# Patient Record
Sex: Female | Born: 1950 | Race: White | Hispanic: No | Marital: Married | Smoking: Never smoker
Health system: Southern US, Community
[De-identification: ages and names within clinical notes are randomized; demographics above are authoritative.]

## PROBLEM LIST (undated history)

## (undated) DIAGNOSIS — N809 Endometriosis, unspecified: Secondary | ICD-10-CM

## (undated) DIAGNOSIS — D229 Melanocytic nevi, unspecified: Secondary | ICD-10-CM

## (undated) DIAGNOSIS — F99 Mental disorder, not otherwise specified: Secondary | ICD-10-CM

## (undated) DIAGNOSIS — M797 Fibromyalgia: Secondary | ICD-10-CM

## (undated) DIAGNOSIS — R002 Palpitations: Secondary | ICD-10-CM

## (undated) DIAGNOSIS — M1990 Unspecified osteoarthritis, unspecified site: Secondary | ICD-10-CM

## (undated) DIAGNOSIS — C44311 Basal cell carcinoma of skin of nose: Secondary | ICD-10-CM

## (undated) DIAGNOSIS — R0602 Shortness of breath: Secondary | ICD-10-CM

## (undated) DIAGNOSIS — S83209A Unspecified tear of unspecified meniscus, current injury, unspecified knee, initial encounter: Secondary | ICD-10-CM

## (undated) DIAGNOSIS — F32A Depression, unspecified: Secondary | ICD-10-CM

## (undated) DIAGNOSIS — R238 Other skin changes: Secondary | ICD-10-CM

## (undated) DIAGNOSIS — G4700 Insomnia, unspecified: Secondary | ICD-10-CM

## (undated) DIAGNOSIS — R011 Cardiac murmur, unspecified: Secondary | ICD-10-CM

## (undated) DIAGNOSIS — R233 Spontaneous ecchymoses: Secondary | ICD-10-CM

## (undated) DIAGNOSIS — IMO0002 Reserved for concepts with insufficient information to code with codable children: Secondary | ICD-10-CM

## (undated) DIAGNOSIS — M8580 Other specified disorders of bone density and structure, unspecified site: Secondary | ICD-10-CM

## (undated) DIAGNOSIS — Z9289 Personal history of other medical treatment: Secondary | ICD-10-CM

## (undated) DIAGNOSIS — F329 Major depressive disorder, single episode, unspecified: Secondary | ICD-10-CM

## (undated) DIAGNOSIS — I959 Hypotension, unspecified: Secondary | ICD-10-CM

## (undated) DIAGNOSIS — R61 Generalized hyperhidrosis: Secondary | ICD-10-CM

## (undated) DIAGNOSIS — G43909 Migraine, unspecified, not intractable, without status migrainosus: Secondary | ICD-10-CM

## (undated) DIAGNOSIS — M170 Bilateral primary osteoarthritis of knee: Secondary | ICD-10-CM

## (undated) DIAGNOSIS — M810 Age-related osteoporosis without current pathological fracture: Secondary | ICD-10-CM

## (undated) DIAGNOSIS — K589 Irritable bowel syndrome without diarrhea: Secondary | ICD-10-CM

## (undated) DIAGNOSIS — B009 Herpesviral infection, unspecified: Secondary | ICD-10-CM

## (undated) DIAGNOSIS — R87619 Unspecified abnormal cytological findings in specimens from cervix uteri: Secondary | ICD-10-CM

## (undated) DIAGNOSIS — F419 Anxiety disorder, unspecified: Secondary | ICD-10-CM

## (undated) DIAGNOSIS — E079 Disorder of thyroid, unspecified: Secondary | ICD-10-CM

## (undated) DIAGNOSIS — R079 Chest pain, unspecified: Secondary | ICD-10-CM

## (undated) DIAGNOSIS — K219 Gastro-esophageal reflux disease without esophagitis: Secondary | ICD-10-CM

## (undated) DIAGNOSIS — E039 Hypothyroidism, unspecified: Secondary | ICD-10-CM

## (undated) HISTORY — DX: Depression, unspecified: F32.A

## (undated) HISTORY — DX: Migraine, unspecified, not intractable, without status migrainosus: G43.909

## (undated) HISTORY — PX: JOINT REPLACEMENT: SHX530

## (undated) HISTORY — DX: Irritable bowel syndrome, unspecified: K58.9

## (undated) HISTORY — DX: Fibromyalgia: M79.7

## (undated) HISTORY — DX: Unspecified tear of unspecified meniscus, current injury, unspecified knee, initial encounter: S83.209A

## (undated) HISTORY — PX: SKIN CANCER EXCISION: SHX779

## (undated) HISTORY — DX: Disorder of thyroid, unspecified: E07.9

## (undated) HISTORY — PX: APPENDECTOMY: SHX54

## (undated) HISTORY — DX: Gastro-esophageal reflux disease without esophagitis: K21.9

## (undated) HISTORY — PX: GYNECOLOGIC CRYOSURGERY: SHX857

## (undated) HISTORY — PX: COLPOSCOPY W/ BIOPSY / CURETTAGE: SUR283

## (undated) HISTORY — DX: Mental disorder, not otherwise specified: F99

## (undated) HISTORY — DX: Shortness of breath: R06.02

## (undated) HISTORY — DX: Bilateral primary osteoarthritis of knee: M17.0

## (undated) HISTORY — DX: Reserved for concepts with insufficient information to code with codable children: IMO0002

## (undated) HISTORY — DX: Age-related osteoporosis without current pathological fracture: M81.0

## (undated) HISTORY — DX: Other specified disorders of bone density and structure, unspecified site: M85.80

## (undated) HISTORY — PX: BUNIONECTOMY: SHX129

## (undated) HISTORY — PX: COLONOSCOPY: SHX174

## (undated) HISTORY — DX: Insomnia, unspecified: G47.00

## (undated) HISTORY — DX: Chest pain, unspecified: R07.9

## (undated) HISTORY — DX: Major depressive disorder, single episode, unspecified: F32.9

## (undated) HISTORY — DX: Endometriosis, unspecified: N80.9

## (undated) HISTORY — DX: Unspecified osteoarthritis, unspecified site: M19.90

## (undated) HISTORY — PX: ELBOW SURGERY: SHX618

## (undated) HISTORY — PX: TONSILECTOMY, ADENOIDECTOMY, BILATERAL MYRINGOTOMY AND TUBES: SHX2538

## (undated) HISTORY — DX: Herpesviral infection, unspecified: B00.9

## (undated) HISTORY — PX: UPPER GI ENDOSCOPY: SHX6162

## (undated) HISTORY — PX: LAPAROSCOPY: SHX197

## (undated) HISTORY — DX: Palpitations: R00.2

## (undated) HISTORY — DX: Generalized hyperhidrosis: R61

## (undated) HISTORY — DX: Unspecified abnormal cytological findings in specimens from cervix uteri: R87.619

---
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
## 1975-03-03 DIAGNOSIS — Z9289 Personal history of other medical treatment: Secondary | ICD-10-CM

## 1975-03-03 HISTORY — DX: Personal history of other medical treatment: Z92.89

## 1988-03-02 HISTORY — PX: ABDOMINAL HYSTERECTOMY: SHX81

## 1997-11-21 ENCOUNTER — Other Ambulatory Visit: Admission: RE | Admit: 1997-11-21 | Discharge: 1997-11-21 | Payer: Self-pay | Admitting: *Deleted

## 1998-11-27 ENCOUNTER — Other Ambulatory Visit: Admission: RE | Admit: 1998-11-27 | Discharge: 1998-11-27 | Payer: Self-pay | Admitting: *Deleted

## 1999-06-18 ENCOUNTER — Ambulatory Visit (HOSPITAL_COMMUNITY): Admission: RE | Admit: 1999-06-18 | Discharge: 1999-06-18 | Payer: Self-pay | Admitting: Gastroenterology

## 2000-03-02 HISTORY — PX: CERVICAL FUSION: SHX112

## 2000-06-25 ENCOUNTER — Encounter: Payer: Self-pay | Admitting: Neurosurgery

## 2000-06-25 ENCOUNTER — Ambulatory Visit (HOSPITAL_COMMUNITY): Admission: RE | Admit: 2000-06-25 | Discharge: 2000-06-25 | Payer: Self-pay | Admitting: Neurosurgery

## 2000-08-18 ENCOUNTER — Inpatient Hospital Stay (HOSPITAL_COMMUNITY): Admission: RE | Admit: 2000-08-18 | Discharge: 2000-08-19 | Payer: Self-pay | Admitting: Neurosurgery

## 2000-08-18 ENCOUNTER — Encounter: Payer: Self-pay | Admitting: Neurosurgery

## 2000-08-30 ENCOUNTER — Encounter: Admission: RE | Admit: 2000-08-30 | Discharge: 2000-08-30 | Payer: Self-pay | Admitting: Neurosurgery

## 2000-08-30 ENCOUNTER — Encounter: Payer: Self-pay | Admitting: Neurosurgery

## 2000-09-22 ENCOUNTER — Other Ambulatory Visit: Admission: RE | Admit: 2000-09-22 | Discharge: 2000-09-22 | Payer: Self-pay | Admitting: *Deleted

## 2000-10-14 ENCOUNTER — Encounter: Payer: Self-pay | Admitting: Neurosurgery

## 2000-10-14 ENCOUNTER — Encounter: Admission: RE | Admit: 2000-10-14 | Discharge: 2000-10-14 | Payer: Self-pay | Admitting: Neurosurgery

## 2001-02-17 ENCOUNTER — Ambulatory Visit (HOSPITAL_COMMUNITY): Admission: RE | Admit: 2001-02-17 | Discharge: 2001-02-17 | Payer: Self-pay | Admitting: Gastroenterology

## 2001-02-17 ENCOUNTER — Encounter: Payer: Self-pay | Admitting: Gastroenterology

## 2001-03-02 HISTORY — PX: CHOLECYSTECTOMY: SHX55

## 2001-03-10 ENCOUNTER — Encounter (INDEPENDENT_AMBULATORY_CARE_PROVIDER_SITE_OTHER): Payer: Self-pay | Admitting: Specialist

## 2001-03-10 ENCOUNTER — Ambulatory Visit (HOSPITAL_COMMUNITY): Admission: RE | Admit: 2001-03-10 | Discharge: 2001-03-11 | Payer: Self-pay | Admitting: Surgery

## 2001-10-17 ENCOUNTER — Other Ambulatory Visit: Admission: RE | Admit: 2001-10-17 | Discharge: 2001-10-17 | Payer: Self-pay | Admitting: *Deleted

## 2001-12-28 ENCOUNTER — Encounter: Payer: Self-pay | Admitting: Otolaryngology

## 2001-12-28 ENCOUNTER — Encounter: Admission: RE | Admit: 2001-12-28 | Discharge: 2001-12-28 | Payer: Self-pay | Admitting: Otolaryngology

## 2002-10-25 ENCOUNTER — Other Ambulatory Visit: Admission: RE | Admit: 2002-10-25 | Discharge: 2002-10-25 | Payer: Self-pay | Admitting: *Deleted

## 2003-03-13 ENCOUNTER — Encounter: Admission: RE | Admit: 2003-03-13 | Discharge: 2003-03-13 | Payer: Self-pay | Admitting: Internal Medicine

## 2003-07-02 ENCOUNTER — Ambulatory Visit (HOSPITAL_COMMUNITY): Admission: RE | Admit: 2003-07-02 | Discharge: 2003-07-02 | Payer: Self-pay | Admitting: Gastroenterology

## 2003-07-05 ENCOUNTER — Ambulatory Visit (HOSPITAL_COMMUNITY): Admission: RE | Admit: 2003-07-05 | Discharge: 2003-07-05 | Payer: Self-pay | Admitting: Gastroenterology

## 2003-11-02 ENCOUNTER — Ambulatory Visit (HOSPITAL_COMMUNITY): Admission: RE | Admit: 2003-11-02 | Discharge: 2003-11-02 | Payer: Self-pay | Admitting: Gastroenterology

## 2003-11-02 ENCOUNTER — Encounter (INDEPENDENT_AMBULATORY_CARE_PROVIDER_SITE_OTHER): Payer: Self-pay | Admitting: *Deleted

## 2003-11-29 ENCOUNTER — Other Ambulatory Visit: Admission: RE | Admit: 2003-11-29 | Discharge: 2003-11-29 | Payer: Self-pay | Admitting: *Deleted

## 2004-01-16 ENCOUNTER — Ambulatory Visit (HOSPITAL_COMMUNITY): Admission: RE | Admit: 2004-01-16 | Discharge: 2004-01-16 | Payer: Self-pay | Admitting: Gastroenterology

## 2004-01-16 ENCOUNTER — Encounter (INDEPENDENT_AMBULATORY_CARE_PROVIDER_SITE_OTHER): Payer: Self-pay | Admitting: Specialist

## 2004-12-10 ENCOUNTER — Other Ambulatory Visit: Admission: RE | Admit: 2004-12-10 | Discharge: 2004-12-10 | Payer: Self-pay | Admitting: *Deleted

## 2005-03-22 ENCOUNTER — Emergency Department (HOSPITAL_COMMUNITY): Admission: EM | Admit: 2005-03-22 | Discharge: 2005-03-22 | Payer: Self-pay | Admitting: Family Medicine

## 2005-11-24 ENCOUNTER — Ambulatory Visit (HOSPITAL_COMMUNITY): Admission: RE | Admit: 2005-11-24 | Discharge: 2005-11-24 | Payer: Self-pay | Admitting: Orthopedic Surgery

## 2005-12-17 ENCOUNTER — Encounter: Admission: RE | Admit: 2005-12-17 | Discharge: 2005-12-17 | Payer: Self-pay | Admitting: Internal Medicine

## 2006-02-08 ENCOUNTER — Ambulatory Visit (HOSPITAL_COMMUNITY): Admission: RE | Admit: 2006-02-08 | Discharge: 2006-02-08 | Payer: Self-pay | Admitting: Internal Medicine

## 2006-02-17 ENCOUNTER — Other Ambulatory Visit: Admission: RE | Admit: 2006-02-17 | Discharge: 2006-02-17 | Payer: Self-pay | Admitting: *Deleted

## 2006-02-25 ENCOUNTER — Encounter: Admission: RE | Admit: 2006-02-25 | Discharge: 2006-02-25 | Payer: Self-pay | Admitting: Gastroenterology

## 2006-04-02 ENCOUNTER — Encounter (INDEPENDENT_AMBULATORY_CARE_PROVIDER_SITE_OTHER): Payer: Self-pay | Admitting: Specialist

## 2006-04-02 ENCOUNTER — Ambulatory Visit (HOSPITAL_COMMUNITY): Admission: RE | Admit: 2006-04-02 | Discharge: 2006-04-02 | Payer: Self-pay | Admitting: Gastroenterology

## 2007-02-22 ENCOUNTER — Other Ambulatory Visit: Admission: RE | Admit: 2007-02-22 | Discharge: 2007-02-22 | Payer: Self-pay | Admitting: *Deleted

## 2007-07-11 ENCOUNTER — Ambulatory Visit (HOSPITAL_BASED_OUTPATIENT_CLINIC_OR_DEPARTMENT_OTHER): Admission: RE | Admit: 2007-07-11 | Discharge: 2007-07-11 | Payer: Self-pay | Admitting: Rheumatology

## 2007-07-26 ENCOUNTER — Ambulatory Visit: Payer: Self-pay | Admitting: Internal Medicine

## 2007-07-28 ENCOUNTER — Encounter: Admission: RE | Admit: 2007-07-28 | Discharge: 2007-07-28 | Payer: Self-pay | Admitting: Gastroenterology

## 2008-05-30 DIAGNOSIS — D229 Melanocytic nevi, unspecified: Secondary | ICD-10-CM

## 2008-05-30 HISTORY — DX: Melanocytic nevi, unspecified: D22.9

## 2009-03-26 ENCOUNTER — Encounter: Admission: RE | Admit: 2009-03-26 | Discharge: 2009-03-26 | Payer: Self-pay | Admitting: Neurosurgery

## 2009-04-17 ENCOUNTER — Encounter: Admission: RE | Admit: 2009-04-17 | Discharge: 2009-04-17 | Payer: Self-pay | Admitting: Neurosurgery

## 2009-05-23 ENCOUNTER — Encounter: Admission: RE | Admit: 2009-05-23 | Discharge: 2009-05-23 | Payer: Self-pay | Admitting: Neurosurgery

## 2010-03-02 HISTORY — PX: CATARACT EXTRACTION: SUR2

## 2010-03-23 ENCOUNTER — Encounter: Payer: Self-pay | Admitting: Gastroenterology

## 2010-05-21 ENCOUNTER — Encounter (INDEPENDENT_AMBULATORY_CARE_PROVIDER_SITE_OTHER): Payer: Self-pay | Admitting: Physician Assistant

## 2010-05-21 DIAGNOSIS — A6000 Herpesviral infection of urogenital system, unspecified: Secondary | ICD-10-CM

## 2010-05-21 DIAGNOSIS — Z01419 Encounter for gynecological examination (general) (routine) without abnormal findings: Secondary | ICD-10-CM

## 2010-05-23 NOTE — Progress Notes (Signed)
[name], [name]                  ACCOUNT NO.:  [id number]  MEDICAL RECORD NO.:  [id number]           PATIENT TYPE:  A  LOCATION:  [facility]                   FACILITY:  [facility]  PHYSICIAN:  Maylon Cos, CNM    DATE OF BIRTH:  09/21/1950  DATE OF SERVICE:  05/21/2010                                 CLINIC NOTE  The patient is being seen in GYN Clinic at [facility].  REASON FOR TODAY'S VISIT:  Annual exam and Pap smear.  The patient presents with no complaints.  HISTORY OF PRESENT ILLNESS:  The patient has a long problem list to include; 1. Arthritis. 2. Acid reflux. 3. Irritable bowel syndrome. 4. Depression. 5. Thyroid disease. 6. Fibromyalgia. 7. Multiple allergies. 8. Migraines.  DRUG ALLERGIES:  Include CODEINE and SULFA.  CURRENT MEDICATIONS:  Numerous and listed in the chart.  HEALTH CARE MAINTENANCE:  The patient is up-to-date on her tetanus and flu shot.  Her last mammogram was in August 2011 and found to be normal. Her last fecal occult stool was in 2012, by Dr. Vassie Loll, her primary care doctor.  Colonoscopy was normal in 2009.  Last dental exam in 2011.  MENSTRUAL HISTORY:  She had menarche at the age of 59.  Her last period was in 1990.  She had a complete hysterectomy for endometriosis and has not had any bleeding or pain since.  CONTRACEPTIVE HISTORY:  She is status post hysterectomy.  OBSTETRICAL HISTORY:  She is a gravida 1, para 0.  She has 2 adopted children but no live birth.  GYNECOLOGIC HISTORY:  Her last Pap smear was done in 2010 and she has had yearly Pap smears by Dr. Chevis Pretty since her hysterectomy.  She had history of an abnormal Pap smear greater than 10 years ago that was treated with colposcopy and cryo.  STD HISTORY:  Positive for genital herpes for which she currently takes Valtrex daily.  PERSONAL MEDICAL HISTORY:  Has already been reviewed.  She did receive a blood transfusion in 1997.  SURGICAL HISTORY:  Positive for a total  hysterectomy by a Dr. Darin Engels in 1990.  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  She lives with her 2 year old daughter.  She consumes approximately 1 glass of wine in the evenings.  Drug use is positive for marijuana 20 years ago.  She has had 1 partner in the last year, however, this is her husband who died in 12/18/2022.  FAMILY HISTORY:  Positive for heart disease and heart attack in her father who died in 2023-02-17 and colon cancer in her father.  Her mother has recently been diagnosed with dementia and has had multiple strokes.  SYSTEMIC REVIEW:  Positive for anxiety and depression for which she is currently being treated by Dr. Evelene Croon. Occasional palpitations which are being evaluated by her primary care doctor, Dr. Vassie Loll, and swelling in her hands, legs, and feet.  PHYSICAL EXAMINATION:  GENERAL:  Today, [name] is a frail-appearing 60 year old Caucasian female, in no apparent distress.  Her exam today is GYN focused. HEENT:  Grossly normal. BREASTS:  Symmetrical without  dimpling or retraction in the skin. Nipples are erect without discharge.  No masses or tenderness with palpation. ABDOMEN:  Soft and nontender.  No masses.  No hepatosplenomegaly. GU:  External genitalia without lesions.  She does have hypotrophic vaginal tissue with moderate tone.  Her cuff appears to be intact on speculum exam.  No unusual or abnormal discharge noted in the vault. Bimanual exam reveals absent cervix, absent uterus, absent adnexa bilaterally, nontender with exam. RECTAL:  Positive for some very small asymptomatic hemorrhoids.  Rectal exam was deferred.  Lower extremities are negative for edema.  Equal pedal pulses.  ASSESSMENT: 1. Normal well-woman exam posthysterectomy. 2. Multiple medical diagnoses, currently being treated by multiple     providers. 3. Anxiety, depression, recently exacerbated by the death of her     husband and her  father and stress of mother recent diagnosis of     dementia.  PLAN: 1. We discussed recommendations and given that her hysterectomy was     done for noncancerous reasons, Pap smears are no longer necessary.     She is in agreement with this plan.  She does desire annual     physicals to be done in our clinic rather than with her primary     care provider. 2. Health care maintenance.  She is due for mammogram in August 2012. 3. The patient is encouraged to contact her counselor.  She recently     had a change in her insurance and her previous therapists are no     longer in her preferred network, however, she has not received     counseling since the death of her husband and her father and     increased stress related to her mother's dementia diagnosis.  The     patient is strongly encouraged to contact her provider for     recommendations of new therapist counselor in her network. 4. The patient is instructed to take all medications as prescribed by     her providers. 5. Prescriptions are being given for her suppression of Valtrex 500 mg     daily and her hormone replacement therapy which is estradiol patch     0.005 mg.  We have discussed that we should begin weaning this in     the next year as well and she is in agreement.  The patient is also     being given a prescription for topical Vaniqa     secondary to complaint of increased hair growth, needing waxing and     depilatory of her upper lip.  The patient should return in 12     months for GYN physical or sooner if problems.          ______________________________ Maylon Cos, CNM    SS/MEDQ  D:  05/21/2010  T:  05/22/2010  Job:  [id number]

## 2010-07-15 NOTE — Procedures (Signed)
[name], [name]                  ACCOUNT NO.:  [id number]   MEDICAL RECORD NO.:  [id number]          PATIENT TYPE:  OUT   LOCATION:  SLEEP CENTER                 FACILITY:  [facility]   PHYSICIAN:  Clinton D. Maple Hudson, MD, FCCP, FACPDATE OF BIRTH:  24-May-1950   DATE OF STUDY:  07/11/2007                            NOCTURNAL POLYSOMNOGRAM   REFERRING PHYSICIAN:  Kathryne Hitch, MD   INDICATION FOR STUDY:  Insomnia with sleep apnea.    Epworth sleepiness score 12/24, BMI 19.6, weight 118 pounds, height 65  inches, neck 10.5 inches.   HOME MEDICATION:  Charted and reviewed.   SLEEP ARCHITECTURE:  Total sleep time 311.5 minutes with sleep  efficiency 88.7%.  Stage 1 was 5.1%, stage 2 was 94.9%, stage 3 absent,  REM absent, sleep latency 27.5 minutes.  Awake after sleep onset 12  minutes.  Arousal index 8.9.  Bedtime medication included Topamax,  Klonopin, and a medication for bone strength.   RESPIRATORY DATA:  Apnea/hypopnea index (AHI) 0.8 per hour, which is  normal.  A total of 4 events were scored including 2 obstructive apneas  and 2 hypopneas.  All events occurred while sleeping supine.  REM AHI 0.   OXYGEN DATA:  Moderate snoring with oxygen desaturation to a nadir of  89%.  Mean oxygen saturation through the study was 96.5% on room air.   CARDIAC DATA:  Normal sinus rhythm.   MOVEMENT/PARASOMNIA:  No significant movement disturbance.  No bathroom  trips.   IMPRESSION/RECOMMENDATION:  1. Sleep architecture reflected medication, significant for absence of      delta sleep and rapid eye movement but with relatively little      waking during sleep.  2. Insignificant respiratory disturbance, apnea/hypopnea index 0.8 per      hour with moderate snoring and oxygen desaturation to a nadir of      89%.  3. No significant movement disturbance.  4. Suggest managing as insomnia.      Clinton D. Maple Hudson, MD, [facility], FACP  Diplomate, Biomedical engineer of Sleep Medicine  Electronically  Signed     CDY/MEDQ  D:  07/23/2007 19:18:27  T:  07/23/2007 21:39:26  Job:  [id number]

## 2010-07-18 NOTE — Op Note (Signed)
[place]. [facility]  Patient:    [name], [name] Visit Number: [id number] MRN: [id number]          Service Type: DSU Location: 5700 [id number] Attending Physician:  Shelly Rubenstein Dictated by:   Abigail Miyamoto, M.D. Proc. Date: 03/10/01 Admit Date:  03/10/2001                             Operative Report  PREOPERATIVE DIAGNOSIS:  Biliary dyskinesia.  POSTOPERATIVE DIAGNOSIS:  Symptomatic cholelithiasis.  OPERATION PERFORMED:  Laparoscopic cholecystectomy.  SURGEON:  Abigail Miyamoto, M.D.  ANESTHESIA:  General endotracheal anesthesia.  ASSISTANT:  Anselm Pancoast. Zachery Dakins, M.D.  INDICATIONS FOR PROCEDURE:  [name] is a 60 year old female who presented with abdominal pain, nausea and vomiting.  She was found to have a normal ultrasound without evidence of stones and a HIDA scan which showed a questionably low ejection fraction.  Given her symptoms, decision was made to proceed to the operating room for a laparoscopic cholecystectomy.  FINDINGS:   The patient was found to have a gallbladder with some scarring and small stones inside.  ESTIMATED BLOOD LOSS:  DESCRIPTION OF PROCEDURE:  Patient brought to operating room and identified as [name].  She was placed supine on the operating table and general anesthesia was induced.  Her abdomen was then prepped and draped in the usual sterile fashion.  Using a 15 blade, a small vertical incision was made through a previous incision at the umbilical incision.  The incision was carried down to the fascia with an open scalpel.  A hemostat was then used to pass into the peritoneal cavity.  A 0 Vicryl pursestring suture was then placed around the fascial opening.  The Hasson port was placed through the opening and insufflation of the abdomen was begun.  Next, an 11 mm port was placed in the epigastrium and two 5 mm ports were placed in the patients right flank all under direct vision.  The gallbladder  was then grasped and retracted above the liver bed.  The cystic duct was dissected out and clipped once distally and twice proximally and transected with scissors.  The cystic artery was then identified and clipped twice proximally and once distally and transected as well.  The gallbladder was then dissected free from the liver bed with electrocautery.  During dissection the gallbladder was torn slightly and some tiny stones spilled out.  Once the gallbladder was completely excised from the liver bed it was placed in an Endo sac.  Hemostasis was then achieved in the liver bed with electrocautery.  The gallbladder was then removed through the port of the umbilicus.  The 0 Vicryl at the umbilicus was then tied in place closing fascial defect.  The abdomen was then copiously irrigated with normal saline.  Hemostasis appeared to be achieved.  All ports were then removed under direct vision.  The abdomen was deflated.  All incisions were then anesthetized with 0.25% Marcaine and closed with 4-0 Vicryl subcuticular sutures.  Steri-Strips, gauze and tape were applied.  The patient tolerated the procedure well.  All sponge, needle and instrument counts were correct at the end of the procedure.  The patient was then extubated in the operating room and taken in stable condition to the recovery room. Dictated by:   Abigail Miyamoto, M.D. Attending Physician:  Shelly Rubenstein DD:  03/10/01 TD:  03/10/01 Job: [id number]

## 2010-07-18 NOTE — Op Note (Signed)
[place]. [facility]  Patient:    [name], [name]                           MRN: [id number] Proc. Date: 08/18/00 Adm. 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  POSTOPERATIVE DIAGNOSIS:  C6, C7 spondylosis with a chronic C7 radiculopathy.  PROCEDURES:  Anterior C6-7 diskectomy, bilateral foraminotomy, decompression of spinal cord, iliac crest bone graft, bone bank.  Premier plate. Microscope.  SURGEON:  Tanya Nones. Jeral Fruit, M.D.  ASSISTANT:  Hewitt Shorts, M.D.  CLINICAL HISTORY:  The patient is a 60 year old female complaining of neck and right upper extremity pain.  She has failed conservative treatment.  X-rays show that she has spondylosis between C6 and C7 as well as some degenerative changes in the right shoulder.  Surgery was advised.  The patient knew of the risks as explained in the history and physical.  DESCRIPTION OF PROCEDURE:  The patient was taken to the OR and after intubation, the left side of the neck was prepped with Betadine.  Midline incision transversely was done through the skin, platysma, and anterior cervical spine.  X-rays showed that indeed we were at the level of C6-7. There was anterior herniation of the disk.  The anterior ligament was opened. The disk was above the _____.   The normal space was removed, and we entered into the end plate.  Total gross diskectomy was achieved.  We brought the microscope into the area and with the 1 and 2 mm Kerrison punch, foraminotomy was done.  The right side was worse than the left one.  Decompression of the spinal cord was achieved after we removed the posterior ligament.  Then the end plates were drilled, and a piece of bone graft of 7 mm was inserted, followed by a plate using four screws.  Lateral C-spine showed good position of the graft and the  plate.  From then on, the area was irrigated. Investigation of the esophagus, trachea, and carotid was normal.  Then the wound was closed with Vicryl and Steri-Strips. DD:  08/18/00 TD:  08/18/00 Job: [id number]

## 2010-07-18 NOTE — H&P (Signed)
[place]. [facility]  Patient:    [name], [name]                           MRN: [id number] Adm. Date:  [id number] Attending:  Danella Penton                         History and Physical  HISTORY OF PRESENT ILLNESS:  The patient is a 60 year old female who had been seen by me on several occasions in my office because of right arm pain since August of last year.  At the beginning, she was given some medication and she was treated on several occasions with prednisone, anti-inflammatories, pain medication and physical therapy with no improvement whatsoever.  The pain is always going to the right shoulder and lately, for the past few months, she had been complaining of pain going to the right arm.  She has good days and bad days but overall, she never had been back to normal.  She denies any pain in the left upper extremity.  She had been seen by [place] in the office on several occasions, up to the point that we ended up doing an outpatient cervical myelogram.  Now, she decided to go ahead with surgery.  PAST MEDICAL HISTORY:  Tonsillectomy.  She had surgery to repair the tubal ligation in ______ , hysterectomy in 1990, hemorrhoidectomy.  ALLERGIES:  She is allergic to SULFA and CODEINE.  SOCIAL HISTORY:  Patient does not smoke, does not drink.  She is 146 pounds and she is 5 feet 1 inch.  FAMILY HISTORY:  Mother has a history of high blood pressure and father has a history of colon cancer and coronary artery bypass.  REVIEW OF SYSTEMS:  She has a history of fracture of the spine back in 1976. She has a history of back pain and blood transfusion in 1976.  PHYSICAL EXAMINATION:  HEENT:  Normal.  NECK:  She is able to flex but extension and lateralization produce some discomfort going to the shoulder.  LUNGS:  Clear.  HEART:  Heart sounds normal.  ABDOMEN:  Normal.  EXTREMITIES:  Normal pulses.  NEUROLOGIC:  Mental status normal.  Cranial nerves  normal.  Strength:  In the right upper extremity, she has a normal deltoid.  I can break the right biceps, right wrist and also the right triceps.  The left arm is normal. Lower extremities normal.  Sensation normal.  Reflexes normal.  IMAGING STUDIES:  The MRI as well as the cervical myelogram showed that indeed she has spondylosis at the level of C6-C7.  CLINICAL IMPRESSION:  Spondylosis, C6-C7, with accompanying C7 radiculopathy.  RECOMMENDATION:  I talked to her and her friend on several occasions in my office.  They came to my office on three or four occasions and she was sent back to the orthopedic surgeon to check the shoulder.  Finally, she decided to go ahead with surgery because she cannot deal with the pain.  The procedure will be anterior diskectomy at the level of C6-C7, followed with bone graft and a plate.  She knows about the risks such as infection, CSF leak, worsening of the pain, paralysis, need for further surgery and failure of the plate, failure of the bone graft and damage to the esophagus, damage to the trachea and ______ .  She declined more opinion. DD:  08/18/00 TD:  08/18/00 Job: [id number]

## 2010-07-18 NOTE — Op Note (Signed)
NAMEGWENDOLINE, JUDY                            ACCOUNT NO.:  [id number]   MEDICAL RECORD NO.:  [id number]                   PATIENT TYPE:  AMB   LOCATION:  ENDO                                 FACILITY:  [facility]   PHYSICIAN:  Anselmo Rod, M.D.               DATE OF BIRTH:  07/02/1950   DATE OF PROCEDURE:  DATE OF DISCHARGE:                                 OPERATIVE REPORT   PROCEDURE PERFORMED:  Esophagogastroduodenoscopy.   ENDOSCOPIST:  Charna Elizabeth, M.D.   INSTRUMENT USED:  Olympus video panendoscope.   INDICATIONS FOR PROCEDURE:  This is a 60 year old white female with a  history of severe epigastric pain status post laparoscopic cholecystectomy  in the past with abnormal weight loss and five positive stools.  Rule out  peptic ulcer disease, esophagitis, gastritis, etc.   PROCEDURE PREPARATION:  Informed consent was procured from the patient.  The  patient was fasted for eight hours prior to the procedure.   PREPROCEDURE PHYSICAL EXAMINATION:  VITAL SIGNS:  The patient has stable  vital signs.  NECK:  Supple.  CHEST:  Clear to auscultation.  HEART:  S1 and S2 regular.  ABDOMEN:  Soft with normal bowel sounds.   DESCRIPTION OF PROCEDURE:  The patient was placed in the left lateral  decubitus position and sedated with 100 mg of Demerol and 6 mg of Versed in  slow incremental doses.  Once the patient was adequately sedated and  maintained on low flow oxygen and continuous cardiac monitoring, the Olympus  video panendoscope was advanced through the mouthpiece, over the tongue,  into the esophagus under direct vision.  The entire esophagus appeared  normal with no evidence of ring, stricture, masses, esophagitis or Barrett's  mucosa.  Except for a small hiatal hernia seen on high retroflexion, the  entire gastric mucosa and the proximal small bowel appeared normal.  The  esophagus was normal with no evidence of ring, stricture, masses,  esophagitis or Barrett's mucosa.  The  scope was then advanced into the  stomach.  The entire gastric mucosa appeared normal on retroflexed view in  the high cardia.  The proximal small bowel was normal as well.   IMPRESSION:  Normal EGD.   RECOMMENDATIONS:  1. Repeat guaiacs on an outpatient basis.  2. Reassess need for colonoscopy considering the patient's family history of     colon cancer in several family members.  3. Continue a high fiber diet with liberal fluid intake and avoid     nonsteroidals for now.  4. A daily diary is to be maintained.  5. Further recommendations will be made in followup.                                               Jyothi Nat  Loreta Ave, M.D.    JNM/MEDQ  D:  07/02/2003  T:  07/03/2003  Job:  [id number]   cc:   Larina Earthly, M.D.  [address]  [place]  [place] [address]  Fax: [phone]

## 2010-07-18 NOTE — Op Note (Signed)
[name], NORED                  ACCOUNT NO.:  [id number]   MEDICAL RECORD NO.:  [id number]          PATIENT TYPE:  AMB   LOCATION:  ENDO                         FACILITY:  [facility]   PHYSICIAN:  Anselmo Rod, M.D.  DATE OF BIRTH:  1950/07/26   DATE OF PROCEDURE:  11/02/2003  DATE OF DISCHARGE:  11/02/2003                                 OPERATIVE REPORT   PROCEDURE PERFORMED:  Colonoscopy with cold biopsies x3.   ENDOSCOPIST:  Anselmo Rod, M.D.   INSTRUMENT USED:  Olympus videocolonoscope.   INDICATION FOR THE PROCEDURE:  A 60 year old white female with a family  history of colon cancer and guaiac-positive stool diagnosed in the recent  past, undergoing screening colonoscopy, rule out colonic polyps, masses,  etc.   PREPROCEDURE PREPARATION:  Informed consent was procured from the patient.  The patient was fasted for eight hours prior to the procedure and prepped  with a bottle of magnesium citrate and a gallon of GoLYTELY the night prior  to the procedure.   PREPROCEDURE PHYSICAL:  VITAL SIGNS:  Stable.  NECK:  Supple.  CHEST:  Clear to auscultation.  S1 and S2 regular.  ABDOMEN:  Soft with normal bowel sounds.   DESCRIPTION OF THE PROCEDURE:  The patient was placed in the left lateral  decubitus position and sedated with 60 mg of Demerol and 6.5 mg of Versed in  slow incremental doses.  Once the patient was adequately sedated and  maintained on low-flow oxygen and continuous cardiac monitoring, the Olympus  videocolonoscope was advanced from the rectum to the cecum with difficulty.  A small sessile polyp was biopsied from the mid-right colon.  There was a  large amount of residual stool and visualization and, therefore,  visualization was inadequate.  No other abnormalities were recognized.  The  patient tolerated the procedure well without complications.   IMPRESSION:  1.  Small sessile polyp cold biopsied from mid-right colon.  2.  Large amount of residual stool  in the colon.  Small lesions could have      been missed.   RECOMMENDATIONS:  1.  Await pathology results.  2.  Repeat colonoscopy at an earlier date as visualization was inadequate      today.  3.  Outpatient followup in the next two weeks for further recommendations.  4.  A high fiber diet with liberal fluid intake has been advised and      avoidance of laxatives has been encouraged.       JNM/MEDQ  D:  12/31/2003  T:  12/31/2003  Job:  [id number]   cc:   Talmadge Coventry, M.D.  [address]  [place]  [place] [address]  Fax: [phone]

## 2010-07-18 NOTE — Op Note (Signed)
[place]. [facility]  Patient:    [name], [name]                           MRN: [id number] Proc. Date: 06/18/99 Adm. Date:  [id number] Attending:  Charna Elizabeth CC:         Edwin L. Judie Grieve, M.D.             Almedia Balls. Randell Patient, M.D.                           Operative Report  REFERRING PHYSICIAN:  Edwin L. Judie Grieve, M.D.  PROCEDURE:  Esophagogastroduodenoscopy.  ENDOSCOPIST:  Anselmo Rod, M.D.  INSTRUMENT:  Olympus video panendoscope.  INDICATIONS:  Epigastric pain and guaiac positive stool in a 60 year old white female, rule out peptic ulcer disease, esophagitis, gastritis, etc.  PREPROCEDURE PREPARATION:  Informed consent was procured from the patient.  The  patient was fasted for eight hours prior to the procedure.  PREPROCEDURE PHYSICAL EXAMINATION:  VITAL SIGNS: The patient had stable vital signs.  NECK:  Supple.  CHEST: Clear to auscultation.  HEART: S1 and S2 regular. ABDOMEN: Soft with normal abdominal bowel sounds.  Epigastric tenderness on palpation with no guarding, rebound, or regidity.  No hepatosplenomegaly.  DESCRIPTION OF PROCEDURE:  The patient was placed in the left lateral decubitus  position and sedated with 40 mg of Demerol and 5 mg of Versed intravenously. Once the patient was adequately sedated and maintained on low flow oxygen and continuous cardiac monitoring, the Olympus video panendoscope was advanced through the mouthpiece, over the tongue, into the esophagus under direct vision.  The entire esophagus appeared normal without evidence of ring, stricture, masses, lesions, or esophagitis.  The scope was then advanced to the stomach.  A 3 to 4 mm hiatal hernia was seen in retroflexion in the high cardia.  The rest of the gastric mucosa and the proximal small bowel appeared healthy.  IMPRESSION:  A small hiatal hernia measuring 3 to 4 mm, otherwise normal EGD.  RECOMMENDATIONS:  Proceed with colonoscopy at this time.  Antireflux  measures have been emphasized.  Avoid all nonsteroidals.  Outpatient follow-up in the next two weeks. DD:  06/18/99 TD:  06/18/99 Job: [id number]

## 2010-07-18 NOTE — Procedures (Signed)
[place]  Patient:    [name], [name]                           MRN: [id number] Proc. Date: 06/18/99 Adm. Date:  [id number] Attending:  Charna Elizabeth CC:         Almedia Balls. Randell Patient, M.D.             Edwin L. Judie Grieve, M.D.                           Procedure Report  DATE OF BIRTH:  November 19, 1950.  REFERRING PHYSICIAN:  PROCEDURE PERFORMED:  Colonoscopy.  ENDOSCOPIST:  Anselmo Rod, M.D.  INSTRUMENT USED:  Olympus video colonoscope.  INDICATIONS FOR PROCEDURE:  Guaiac positive stool and a family history of colon cancer in a 60 year old white female.  Patients father and two of his siblings have had colon cancer in the past.  PREPROCEDURE PREPARATION:  Informed consent was procured from the patient. The patient was fasted for eight hours prior to the procedure and prepped with a bottle of magnesium citrate and a gallon of NuLytely the night prior to the procedure.  PREPROCEDURE PHYSICAL:  The patient had stable vital signs.  Neck supple. Chest clear to auscultation.  S1, S2 regular.  Abdomen soft with normal abdominal bowel sounds.  DESCRIPTION OF PROCEDURE:  The patient was placed in the left lateral decubitus position and sedated with Demerol and Versed for the EGD.  No additional sedation was used for the colonoscopy.  Once the patient was adequately positioned and well sedated, the Olympus video colonoscope was advanced from the rectum to the cecum without difficulty.  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  There were small internal hemorrhoids seen on retroflexion.  No masses, polyps, erosions, ulcerations or diverticula were present.  IMPRESSION: 1. Diffuse melanosis coli throughout the colon with more prominent changes    in the right colon. 2. Small nonbleeding internal hemorrhoids. 3. No masses or polyps  seen.  RECOMMENDATIONS: 1. The patient has been advised to increase the fluid and fiber in her diet. 2. Stool softeners have been advocated at bedtime. 3. She is to avoid harsh laxatives. 4. Five-year colonoscopy is recommended for repeat colorectal cancer screening    unless she were to develop any problems in the interim. 5. Follow-up is advised in the office in the next two weeks.DD:  06/18/99 TD:  06/18/99 Job: [id number]

## 2010-07-18 NOTE — Op Note (Signed)
NAMERUTHMARY, [name]                  ACCOUNT NO.:  [id number]   MEDICAL RECORD NO.:  [id number]          PATIENT TYPE:  AMB   LOCATION:  ENDO                         FACILITY:  [facility]   PHYSICIAN:  Anselmo Rod, M.D.  DATE OF BIRTH:  1950-08-06   DATE OF PROCEDURE:  01/16/2004  DATE OF DISCHARGE:                                 OPERATIVE REPORT   PROCEDURE PERFORMED:  Colonoscopy with biopsies times one (cold biopsy).   ENDOSCOPIST:  Charna Elizabeth, M.D.   INSTRUMENT USED:  Olympus video colonoscope.   INDICATIONS FOR PROCEDURE:  The patient is a 60 year old white female with a  family history of colon cancer in her father and personal history of  adenomatous polyps and intermittent bright red blood per rectum.  The  patient also has a history of chronic constipation.  Patient undergoing  colonoscopy to rule out colonic polyps, masses, etc.   PREPROCEDURE PREPARATION:  Informed consent was procured from the patient.  The patient was fasted for eight hours prior to the procedure and prepped  with a bottle of magnesium citrate and a gallon of GoLYTELY the night prior  to the procedure.   PREPROCEDURE PHYSICAL:  The patient had stable vital signs.  Neck supple.  Chest clear to auscultation.  S1 and S2 regular.  Abdomen soft with normal  bowel sounds.   DESCRIPTION OF PROCEDURE:  The patient was placed in left lateral decubitus  position and sedated with 75 mg of Demerol and 7.5 mg of Versed in slow  incremental doses.  Once the patient was adequately sedated and maintained  on low flow oxygen and continuous cardiac monitoring, the Olympus video  colonoscope was advanced from the rectum to the cecum.  The appendicular  orifice and ileocecal valve were clearly visualized and photographed.  There  was some residual stool in the colon and multiple washes were done.  A small  sessile polyp was biopsied from 50 cm.  Small internal hemorrhoids were seen  on retroflexion.  The rest of the  exam was unremarkable.  The patient  tolerated the procedure well without immediate complications.   IMPRESSION:  1.  Small nonbleeding internal hemorrhoids.  2.  Small sessile polyp biopsied from 50 cm.  3.  Otherwise normal-appearing colon up to the cecum.   RECOMMENDATIONS:  1.  Await pathology results.  2.  Avoid all nonsteroidals including aspirin for the next two weeks.  3.  Repeat colorectal cancer screening depending on pathology results.  4.  Outpatient followup as need arises in the future.       JNM/MEDQ  D:  01/16/2004  T:  01/16/2004  Job:  [id number]   cc:   Larina Earthly, M.D.  [address]  [place]  [place] [address]  Fax: [phone]

## 2010-07-24 ENCOUNTER — Ambulatory Visit (INDEPENDENT_AMBULATORY_CARE_PROVIDER_SITE_OTHER): Payer: PRIVATE HEALTH INSURANCE | Admitting: Physician Assistant

## 2010-07-24 DIAGNOSIS — N951 Menopausal and female climacteric states: Secondary | ICD-10-CM

## 2010-08-04 ENCOUNTER — Other Ambulatory Visit: Payer: Self-pay | Admitting: Dermatology

## 2010-08-15 NOTE — Group Therapy Note (Unsigned)
[name], Fischer                  ACCOUNT NO.:  [id number]  MEDICAL RECORD NO.:  [id number]           PATIENT TYPE:  A  LOCATION:  [facility]                   FACILITY:  [facility]  PHYSICIAN:  Maylon Cos, CNM    DATE OF BIRTH:  08/13/1950  DATE OF SERVICE:  07/24/2010                                 CLINIC NOTE  Reason for today's visit in GYN Clinic at [facility] is a complaint of unusual growth in vaginal area.  HISTORY OF PRESENT ILLNESS:  The patient is a 60 year old nulligravida patient whose problem list include the following; 1. Arthritis. 2. Acid reflux. 3. Irritable bowel syndrome. 4. Depression. 5. Thyroid disease. 6. Fibromyalgia. 7. Multiple allergies. 8. Migraines. 9. Surgically postmenopause.  HISTORY OF PRESENT ILLNESS:  The patient states that she first noticed this area on her vaginal area 2-3 weeks ago.  She has previously been told that after the loss of her husband, but is now in a new relationship and is desiring to resume sexual intercourse.  She is concerned for what this lesion is and would like further evaluation.  On examination, [name] is a pleasant 60 year old Caucasian female in no apparent distress.  HEENT is grossly normal.  Vital signs are stable. Her blood pressure is 102/64, weight is 124.6 today.  She is pleasant and appropriate during exam and her affect is much improved from the last time I have seen her.  Exam today is problem focused in the genital area.  She is Tanner V.  She has vaginal atrophy smooth, pink mucosa. There is an approximately 0.5 cm area that is suspicious for lichen sclerosus versus condyloma noted near the introitus.  I have discussed the need for biopsy of this area for definitive diagnosis.  The patient agrees with this plan however, but does not want to have this biopsy done today and would like to return at a later date due to a trip and concern for pain control.  ASSESSMENT:  Vaginal lesion of unknown  etiology.  PLAN:  The patient should return at her convenience for biopsy for skin lesion and definitive diagnosis.  In the meantime, the patient is being given lidocaine gel to help with her discomfort and refills on her estradiol patch and Estrace cream.         ______________________________ Maylon Cos, CNM   SS/MEDQ  D:  08/14/2010  T:  08/15/2010  Job:  [id number]

## 2010-08-28 ENCOUNTER — Ambulatory Visit: Payer: PRIVATE HEALTH INSURANCE | Admitting: Physician Assistant

## 2010-10-08 ENCOUNTER — Encounter: Payer: Self-pay | Admitting: *Deleted

## 2010-10-08 DIAGNOSIS — M797 Fibromyalgia: Secondary | ICD-10-CM | POA: Insufficient documentation

## 2010-10-08 DIAGNOSIS — F329 Major depressive disorder, single episode, unspecified: Secondary | ICD-10-CM

## 2010-10-08 DIAGNOSIS — K219 Gastro-esophageal reflux disease without esophagitis: Secondary | ICD-10-CM

## 2010-10-08 DIAGNOSIS — G43909 Migraine, unspecified, not intractable, without status migrainosus: Secondary | ICD-10-CM | POA: Insufficient documentation

## 2010-10-08 DIAGNOSIS — M1990 Unspecified osteoarthritis, unspecified site: Secondary | ICD-10-CM

## 2010-10-08 DIAGNOSIS — K589 Irritable bowel syndrome without diarrhea: Secondary | ICD-10-CM | POA: Insufficient documentation

## 2010-10-09 ENCOUNTER — Encounter: Payer: Self-pay | Admitting: Family Medicine

## 2010-10-09 ENCOUNTER — Other Ambulatory Visit (HOSPITAL_COMMUNITY)
Admission: RE | Admit: 2010-10-09 | Discharge: 2010-10-09 | Disposition: A | Discharge status: Home / Self Care | Payer: PRIVATE HEALTH INSURANCE | Source: Ambulatory Visit | Attending: Family Medicine | Admitting: Family Medicine

## 2010-10-09 ENCOUNTER — Ambulatory Visit (INDEPENDENT_AMBULATORY_CARE_PROVIDER_SITE_OTHER): Payer: PRIVATE HEALTH INSURANCE | Admitting: Family Medicine

## 2010-10-09 VITALS — BP 99/58 | HR 76 | Temp 97.9°F | Wt 122.4 lb

## 2010-10-09 DIAGNOSIS — N9089 Other specified noninflammatory disorders of vulva and perineum: Secondary | ICD-10-CM

## 2010-10-09 DIAGNOSIS — N909 Noninflammatory disorder of vulva and perineum, unspecified: Secondary | ICD-10-CM | POA: Insufficient documentation

## 2010-10-09 NOTE — Progress Notes (Signed)
Pt. With small area noted on prior exam who returns for biopsy.  She notes no complaints.  She is unsure if it is growing or not.  On left labia minora is small area of thickening. Otherwise vulva appears clean.   Patient identified, informed consent signed, copy in chart, time out performed.    Area cleansed with Alcohol.  Injected with 1% Lidocaine with Epi.  2 mL.  3 mm punch biopsy performed without difficulty.  Hemostasis obtained with Silver Nitrate.  Patient tolerated procedure well.   Patient given post procedure instructions.   Will call biopsy results.

## 2010-10-09 NOTE — Progress Notes (Signed)
Addended by: Toula Moos on: 10/09/2010 04:52 PM   Modules accepted: Orders

## 2010-10-21 ENCOUNTER — Other Ambulatory Visit: Payer: Self-pay | Admitting: *Deleted

## 2010-10-21 MED ORDER — ESTRADIOL 0.05 MG/24HR TD PTWK: 1.0000 | MEDICATED_PATCH | TRANSDERMAL | Status: DC

## 2010-10-21 NOTE — Progress Notes (Signed)
Telephone message left for pt that a refill for Climara patch has been authorized by Dr. Shawnie Pons and sent to her pharmacy.

## 2011-04-20 ENCOUNTER — Other Ambulatory Visit: Payer: Self-pay | Admitting: *Deleted

## 2011-04-20 MED ORDER — ESTRADIOL 0.05 MG/24HR TD PTWK: 1.0000 | MEDICATED_PATCH | TRANSDERMAL | Status: DC

## 2011-04-20 NOTE — Telephone Encounter (Signed)
Pt left message requesting refills of Climara patch.  She also stated that her name and address have changed and wants to give [place] the information.  Refill order received from Maylon Cos CNM with instructions that pt needs clinic appt 1 yr from original Rx (which will be Aug. 2013).  Returned call to pt and left message that Rx has been sent. I also stated that she will need to make clinic appt in August for annual Gyn exam. I instructed pt to call back to the appt line with her new name and address.

## 2011-04-21 ENCOUNTER — Telehealth: Payer: Self-pay | Admitting: *Deleted

## 2011-04-21 MED ORDER — ESTRADIOL 0.05 MG/24HR TD PTWK: 1.0000 | MEDICATED_PATCH | TRANSDERMAL | Status: DC

## 2011-04-21 NOTE — Telephone Encounter (Signed)
Returned pt call and left message that I have spoken with the pharmacy. They informed me that she was given the brand medication yesterday not the generic and this is what she has been receiving. If the co-pay has changed, then maybe the formulary has changed with her insurance. She should contact her insurance company for more information. Also, the Rx was sent as substitution permissible so that if she would like the generic form of med she may request that when she gets her next refill. Pt may call back if she has additional questions.

## 2011-04-21 NOTE — Telephone Encounter (Signed)
Pt left message stating that she picked up her medicine but the cost is too high. Please call back to discuss.

## 2011-04-21 NOTE — Telephone Encounter (Signed)
Received call from pt. She stated that the Rx needs to be written for brand necessary instead of substitution permissible. Then her co-pay will be lower. Rx changed and pt notified.

## 2011-06-11 ENCOUNTER — Other Ambulatory Visit: Payer: Self-pay | Admitting: *Deleted

## 2011-06-11 ENCOUNTER — Other Ambulatory Visit: Payer: Self-pay | Admitting: Physician Assistant

## 2011-07-06 ENCOUNTER — Telehealth: Payer: Self-pay | Admitting: *Deleted

## 2011-07-06 NOTE — Telephone Encounter (Signed)
Patient called , states her new name is prue, lingenfelter sure if she had given that information.   States I have been on climera and has called recently to have [name] change it so it wouldn't cost so much, but it still costs $80 something a month- states so " I want to go back to the pill estradiol or estrace because it is riduculous to pay that much for patch.  States [facility] is [facility] [address] , call me back , let me know if this is possible. Called Maleigh and informed her we received her message and will send her request to [name] and once we hear back from her we will let her know. Patient voices understanding.

## 2011-07-13 ENCOUNTER — Telehealth: Payer: Self-pay | Admitting: *Deleted

## 2011-07-13 NOTE — Telephone Encounter (Signed)
Pt left a message requesting that we prescribe estradiol for her again because the climara patch is too expensive for her.

## 2011-07-16 MED ORDER — ESTRADIOL 1 MG PO TABS: 1.0000 mg | ORAL_TABLET | Freq: Every day | ORAL | Status: DC

## 2011-07-16 NOTE — Telephone Encounter (Signed)
Spoke with patient. RX sent to pharmacy. Pt will followup in 3 months.

## 2011-07-16 NOTE — Telephone Encounter (Signed)
Yes, please give 1mg  Estadiol #30, 3 RF. Needs FU appt in 3 months.

## 2011-07-29 ENCOUNTER — Telehealth: Payer: Self-pay | Admitting: *Deleted

## 2011-07-29 NOTE — Telephone Encounter (Signed)
Called pt and pt informed me that she has no sex drive and just got married.  I informed pt that some of her medications that she is taking can cause decrease sex drive.  I advised pt to make an appt so that she can speak with a provider about options.  Pt stated understanding and that she wanted to see [name].  I transferred pt to front desk, Antoinette, to schedule appt.

## 2011-07-29 NOTE — Telephone Encounter (Signed)
Patient called stating is having problems with sex. Has no libido and no feeling. Needs help. Would like call back.

## 2011-08-05 ENCOUNTER — Encounter: Payer: Self-pay | Admitting: Physician Assistant

## 2011-08-05 ENCOUNTER — Ambulatory Visit (INDEPENDENT_AMBULATORY_CARE_PROVIDER_SITE_OTHER): Payer: PRIVATE HEALTH INSURANCE | Admitting: Physician Assistant

## 2011-08-05 VITALS — BP 112/67 | HR 68 | Temp 96.5°F | Resp 20 | Ht 65.0 in | Wt 130.0 lb

## 2011-08-05 DIAGNOSIS — N952 Postmenopausal atrophic vaginitis: Secondary | ICD-10-CM

## 2011-08-05 DIAGNOSIS — F329 Major depressive disorder, single episode, unspecified: Secondary | ICD-10-CM

## 2011-08-05 DIAGNOSIS — R6882 Decreased libido: Secondary | ICD-10-CM

## 2011-08-05 DIAGNOSIS — F32A Depression, unspecified: Secondary | ICD-10-CM

## 2011-08-05 MED ORDER — ESTROGENS, CONJUGATED 0.625 MG/GM VA CREA: TOPICAL_CREAM | VAGINAL | Status: DC

## 2011-08-05 NOTE — Progress Notes (Signed)
Chief Complaint:  Follow-up   [name] is  61 y.o. G1P0010.  No LMP recorded. Patient has had a hysterectomy..   She presents complaining of Follow-up  Reports newly married in January, recently started having vaginal dryness and decreased breast sensation. States increased depression symptom treated by PCP: Dr. Lafayette Dragon with Abilify. Reports depression symptoms much improved but c/o decreased sexual desire  Past Medical History: Past Medical History  Diagnosis Date  . GERD (gastroesophageal reflux disease)   . IBS (irritable bowel syndrome)   . Mental disorder     depression  . Migraines   . Abnormal Pap smear     hx of colpo and cryo  . Thyroid disease   . Fibromyalgia   . Herpes   . Insomnia   . Depression   . Ulcer   . Arthritis     osteoarthritis    Past Surgical History: Past Surgical History  Procedure Date  . Cervical fusion     x2  . Elbow surgery   . Tonsilectomy, adenoidectomy, bilateral myringotomy and tubes   . Appendectomy     age 49  . Cholecystectomy   . Abdominal hysterectomy 1990  . Laparoscopy     age 46    Family History: Family History  Problem Relation Age of Onset  . Colon cancer Father   . Heart disease Father   . Kidney failure Father   . Hypertension Father   . Alcohol abuse Father   . Stroke Mother   . Osteoporosis Mother   . Rheum arthritis Mother   . Dementia Mother   . Hypertension Mother   . Anxiety disorder Brother   . Insomnia Brother   . Depression Brother   . Alcohol abuse Brother     Social History: History  Substance Use Topics  . Smoking status: Never Smoker   . Smokeless tobacco: Not on file  . Alcohol Use: 3.6 oz/week    6 Glasses of wine per week    Allergies:  Allergies  Allergen Reactions  . Adhesive (Tape)   . Codeine   . Sulfa Antibiotics      (Not in a hospital admission)  Review of Systems - Negative except what is mentioned in HPI  Physical Exam   Blood pressure 112/67, pulse 68,  temperature 96.5 F (35.8 C), temperature source Oral, resp. rate 20, height 5\' 5"  (1.651 m), weight 130 lb (58.968 kg).  General: General appearance - alert, well appearing, and in no distress Focused Gynecological Exam: Moderate atrophy noted of the vulva and vagina.    Assessment: 1. Vaginal atrophy  conjugated estrogens (PREMARIN) vaginal cream  2. Libido, decreased  conjugated estrogens (PREMARIN) vaginal cream     Plan: Rx 2 gram Estradiol Cream sent to [facility], use every other day Continue astroglide RTC 8 weeks or prn  [name] E. 08/05/2011,4:37 PM

## 2011-08-06 ENCOUNTER — Telehealth: Payer: Self-pay | Admitting: *Deleted

## 2011-08-06 NOTE — Telephone Encounter (Signed)
Patient called again she is concerned that she was given premarin, she didn't think that this is what she and [name] had discussed. Please advise.

## 2011-08-06 NOTE — Telephone Encounter (Signed)
Patient called and stated she was seen yesterday by Maylon Cos and she faxed in an order to [facility] for conjugated estrogen- states when I got to [facility] they did not have such a thing- only had premarin vaginal cream which was already premixed. [name] thought $20 was actually $60. I didn't know it was gonna be premarin, I have some questions for [name] -what her thoughts are and if it is ok- [facility] [address]- is more convenient.

## 2011-08-12 NOTE — Telephone Encounter (Signed)
Spoke to patient and her question is if the "conjugated estrogen" is the same as premarin. Advised patient that it is. Patient states she's gotten the Premarin already from [facility] and will now use. [facility] [address] from here on out. Patient satisfied.

## 2012-03-07 ENCOUNTER — Telehealth: Payer: Self-pay | Admitting: *Deleted

## 2012-03-07 NOTE — Telephone Encounter (Signed)
[name] called and left a message stating she was a patient of Gilford Rile and her pharmacy has been trying to get a prescription renewed for valcyclovir. States pharmacy told her they think [name] is no longer here. Patient requesting someone renew her prescription and she receive a call to tell her who she will need to see now if [name] is gone. States she really needs the prescription renewed- doesn't want to run out.

## 2012-03-09 ENCOUNTER — Encounter: Payer: Self-pay | Admitting: Obstetrics & Gynecology

## 2012-03-09 ENCOUNTER — Ambulatory Visit (INDEPENDENT_AMBULATORY_CARE_PROVIDER_SITE_OTHER): Payer: BC Managed Care – PPO | Admitting: Obstetrics & Gynecology

## 2012-03-09 VITALS — BP 100/67 | HR 76 | Temp 97.4°F | Ht 65.0 in | Wt 131.8 lb

## 2012-03-09 DIAGNOSIS — N951 Menopausal and female climacteric states: Secondary | ICD-10-CM

## 2012-03-09 DIAGNOSIS — Z01419 Encounter for gynecological examination (general) (routine) without abnormal findings: Secondary | ICD-10-CM

## 2012-03-09 DIAGNOSIS — Z0000 Encounter for general adult medical examination without abnormal findings: Secondary | ICD-10-CM

## 2012-03-09 DIAGNOSIS — R232 Flushing: Secondary | ICD-10-CM

## 2012-03-09 MED ORDER — ESTROGENS CONJUGATED 0.625 MG PO TABS: 0.6250 mg | ORAL_TABLET | Freq: Every day | ORAL | Status: DC

## 2012-03-09 MED ORDER — VALACYCLOVIR HCL 500 MG PO TABS: 500.0000 mg | ORAL_TABLET | Freq: Every day | ORAL | Status: DC

## 2012-03-09 MED ORDER — ESTRADIOL 10 MCG VA TABS: ORAL_TABLET | VAGINAL | Status: DC

## 2012-03-09 NOTE — Progress Notes (Signed)
Subjective:    [name] is a 62 y.o. female who presents for an annual exam. The patient has no complaints today. She would like a refill of her daily valtrex. She would like to restart oral ERT for hot flashes/night sweats. She would like to try a different vaginal preparation of her estrogen for atrophy. She says that when she used oral Estrogen she still vaginal atrophy. The patient is sexually active. GYN screening history: last pap: was normal. The patient wears seatbelts: yes. The patient participates in regular exercise: yes. Has the patient ever been transfused or tattooed?: yes. The patient reports that there is not domestic violence in her life.   Menstrual History: OB History    Grav Para Term Preterm Abortions TAB SAB Ect Mult Living   1 0 0 0 1 0 1 0 0 0       Menarche age: 23 No LMP recorded. Patient has had a hysterectomy.    The following portions of the patient's history were reviewed and updated as appropriate: allergies, current medications, past family history, past medical history, past social history, past surgical history and problem list.  Review of Systems A comprehensive review of systems was negative.  She has already had her flu vaccine   Objective:    BP 100/67  Pulse 76  Temp 97.4 F (36.3 C) (Oral)  Ht 5\' 5"  (1.651 m)  Wt 131 lb 12.8 oz (59.784 kg)  BMI 21.93 kg/m2  General Appearance:    Alert, cooperative, no distress, appears stated age  Head:    Normocephalic, without obvious abnormality, atraumatic  Eyes:    PERRL, conjunctiva/corneas clear, EOM's intact, fundi    benign, both eyes  Ears:    Normal TM's and external ear canals, both ears  Nose:   Nares normal, septum midline, mucosa normal, no drainage    or sinus tenderness  Throat:   Lips, mucosa, and tongue normal; teeth and gums normal  Neck:   Supple, symmetrical, trachea midline, no adenopathy;    thyroid:  no enlargement/tenderness/nodules; no carotid   bruit or JVD  Back:      Symmetric, no curvature, ROM normal, no CVA tenderness  Lungs:     Clear to auscultation bilaterally, respirations unlabored  Chest Wall:    No tenderness or deformity   Heart:    Regular rate and rhythm, S1 and S2 normal, no murmur, rub   or gallop  Breast Exam:    No tenderness, masses, or nipple abnormality  Abdomen:     Soft, non-tender, bowel sounds active all four quadrants,    no masses, no organomegaly  Genitalia:    Normal female without lesion, discharge or tenderness, normal bimanual exam, no masses     Extremities:   Extremities normal, atraumatic, no cyanosis or edema  Pulses:   2+ and symmetric all extremities  Skin:   Skin color, texture, turgor normal, no rashes or lesions  Lymph nodes:   Cervical, supraclavicular, and axillary nodes normal  Neurologic:   CNII-XII intact, normal strength, sensation and reflexes    throughout  .    Assessment:    Healthy female exam.    Plan:     Mammogram. oral generic premarin 0.625 mg and vagifem

## 2012-03-09 NOTE — Telephone Encounter (Signed)
Called [name] and informed her she can see any provider for her yearly physicals and /or problems. Discussed that we did see her last year in June for problems , but no documented pap smear or annual exam so she will need that to continue to get refills- patient has an appointment actually scheduled for this afternoon- she had called back and talked to front office staff. Patient states will keep appointment today and understands they will be ok to get refills for a year  Unless any problems .

## 2012-03-10 ENCOUNTER — Telehealth: Payer: Self-pay

## 2012-03-10 NOTE — Telephone Encounter (Signed)
Pt called and stated that she was here yesterday (03/10/12) and saw Dr. Macon Large and was prescribed 3 medications.  She went to pharmacy to find that she has different Rx's than what was dicussed with the provider.

## 2012-03-14 ENCOUNTER — Other Ambulatory Visit: Payer: Self-pay | Admitting: Obstetrics & Gynecology

## 2012-03-14 MED ORDER — ESTRADIOL 10 MCG VA TABS: ORAL_TABLET | VAGINAL | Status: DC

## 2012-03-14 NOTE — Telephone Encounter (Signed)
Patient called back to see about her medicine.

## 2012-03-14 NOTE — Telephone Encounter (Signed)
Pt called and spoke w/Heather Excell Seltzer and stated that she was upset that no one had responded to her messages regarding her medications. I called pt back and informed her that I had been working on her request/question since 1/9 but had not been able to speak with Dr. Marice Potter until today. I apologized for the delay in responding to her concerns. I further stated that Dr. Marice Potter had ordered Premarin instead of estradiol because previously when she had taken estradiol, she was still having vaginal atrophy. Also, pt stated that the pharmacy did not have the Rx for Vagifem. After speaking with Dr. Marice Potter, I called and spoke w/the pharmacist and gave her a verbal Rx for the Vagifem per Dr. Ellin Saba previous order. The pharmacist made recommendations for a change in the administration instructions. I informed pt that she may pick up both prescriptions later today. Pt voiced understanding and thanked me for the apology.

## 2012-04-04 ENCOUNTER — Encounter: Payer: Self-pay | Admitting: Physician Assistant

## 2012-04-16 ENCOUNTER — Other Ambulatory Visit: Payer: Self-pay

## 2012-05-20 ENCOUNTER — Encounter: Payer: Self-pay | Admitting: Obstetrics and Gynecology

## 2012-05-20 ENCOUNTER — Ambulatory Visit (INDEPENDENT_AMBULATORY_CARE_PROVIDER_SITE_OTHER): Payer: BC Managed Care – PPO | Admitting: Obstetrics and Gynecology

## 2012-05-20 VITALS — BP 96/70 | Ht 65.0 in | Wt 131.0 lb

## 2012-05-20 DIAGNOSIS — N750 Cyst of Bartholin's gland: Secondary | ICD-10-CM | POA: Insufficient documentation

## 2012-05-20 NOTE — Progress Notes (Signed)
Patient ID: [name], female   DOB: Jul 10, 1950, 62 y.o.   MRN: [id number]  G1P0 Caucasian female S/P TAH/BSO presenting with right labial mass noted while bathing.  Patient saw Dr. Virgel Manifold, who referred patient for confirmed labial/vabinal cyst.  Patient states cyst is enlarging and painful when wearing jeans.  Denies drainage or fever.  Reports dyspareunia.  Denies problems with voiding or bowel movements.    Patient uses Vagifem intravaginal estrogen and Premarin po.  Has history of endometriosis.  Feels cold most of the time.  Has some sweats.  States up to date on well woman care.  Had right diagnostic mammogram and ultrasound this month for potential mass.  Evaluation was normal.  Recently married.  Pelvic Exam Normal appearing external genitalia/urethra. Vagina - 1.5 cm enlarged right Bartholin's gland.  Left bartholin's is normal.  Cervix absent. BM - No midline nor adnexal masses or tenderness.  Procedure - Drainage of Bartholin's gland cyst Verbal consent performed.  Sterile prep.  Local 1 % lidocaine injected.  Scalpel used to drain cyst.  Old blood clot expressed.  Tissue specimen to pathology.  Minimal bleeding of red blood.  No complications.  Assessment - right Bartholin's gland cyst.  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  Plan Follow up on pathology. Return for office visit in 10 days. Tylenol or ibuprophen OTC prn. No sexual activity until after office visit in 10 days. No antibiotics needed.

## 2012-05-20 NOTE — Patient Instructions (Addendum)
Bartholin's Cyst or Abscess  Bartholin's glands are small glands located within the folds of skin (labia) along the sides of the lower opening of the vagina (birth canal). A cyst may develop when the duct of the gland becomes blocked. When this happens, fluid that accumulates within the cyst can become infected. This is known as an abscess. The Bartholin gland produces a mucous fluid to lubricate the outside of the vagina during sexual intercourse.  SYMPTOMS    Patients with a small cyst may not have any symptoms.   Mild discomfort to severe pain depending on the size of the cyst and if it is infected (abscess).   Pain, redness, and swelling around the lower opening of the vagina.   Painful intercourse.   Pressure in the perineal area.   Swelling of the lips of the vagina (labia).   The cyst or abscess can be on one side or both sides of the vagina.  DIAGNOSIS    A large swelling is seen in the lower vagina area by your caregiver.   Painful to touch.   Redness and pain, if it is an abscess.  TREATMENT    Sometimes the cyst will go away on its own.   Apply warm wet compresses to the area or take hot sitz baths several times a day.   An incision to drain the cyst or abscess with local anesthesia.   Culture the pus, if it is an abscess.   Antibiotic treatment, if it is an abscess.   Cut open the gland and suture the edges to make the opening of the gland bigger (marsupialization).   Remove the whole gland if the cyst or abscess returns.  PREVENTION    Practice good hygiene.   Clean the vaginal area with a mild soap and soft cloth when bathing.   Do not rub hard in the vaginal area when bathing.   Protect the crotch area with a padded cushion if you take long bike rides or ride horses.   Be sure you are well lubricated when you have sexual intercourse.  HOME CARE INSTRUCTIONS    If your cyst or abscess was opened, a small piece of gauze, or a drain, may have been placed in the wound to allow  drainage. Do not remove this gauze or drain unless directed by your caregiver.   Wear feminine pads, not tampons, as needed for any drainage or bleeding.   If antibiotics were prescribed, take them exactly as directed. Finish the entire course.   Only take over-the-counter or prescription medicines for pain, discomfort, or fever as directed by your caregiver.  SEEK IMMEDIATE MEDICAL CARE IF:    You have an increase in pain, redness, swelling, or drainage.   You have bleeding from the wound which results in the use of more than the number of pads suggested by your caregiver in 24 hours.   You have chills.   You have a fever.   You develop any new problems (symptoms) or aggravation of your existing condition.  MAKE SURE YOU:    Understand these instructions.   Will watch your condition.   Will get help right away if you are not doing well or get worse.  Document Released: 02/16/2005 Document Revised: 05/11/2011 Document Reviewed: 10/05/2007  Exit[employer] Patient Information 2013 [employer].

## 2012-06-01 ENCOUNTER — Encounter: Payer: Self-pay | Admitting: Obstetrics and Gynecology

## 2012-06-01 ENCOUNTER — Ambulatory Visit (INDEPENDENT_AMBULATORY_CARE_PROVIDER_SITE_OTHER): Payer: BC Managed Care – PPO | Admitting: Obstetrics and Gynecology

## 2012-06-01 VITALS — BP 98/62 | Ht 65.0 in | Wt 131.0 lb

## 2012-06-01 DIAGNOSIS — N750 Cyst of Bartholin's gland: Secondary | ICD-10-CM

## 2012-06-01 NOTE — Progress Notes (Signed)
Patient ID: [name], female   DOB: Feb 09, 1951, 62 y.o.   MRN: [id number]  Subjective  Patient presents for follow up of drainage of Bartholin's gland from 05/20/12.  Patient had new onset of mass in right inferior labia consistent with a Bartholin's gland filled with old blood.  Has a history of endometriosis and is status post hysterectomy and bilateral salpingo-oophorectomy.  Has been on oral Premarin and Estradiol vaginally.    Recently married.  Asking about resumption of sexual activity.  Objective  Lungs CTA bilaterally.  Cor S1S2 RRR.  No murmur.  Minor ecchymoses of right labia near old incision site.  Area feels nodular, firm, and measures about 1.5 cm.  Pathology report - blood with no tissue fragments.  Assessment  Vulvar cystic lesion.  Probably Bartholin's gland.  Possible endometrioma.  Still feels nodular.  Plan  Will plan for excision as outpatient at [facility].  I discussed risks, benefits and alternatives with patient who wishes to proceed.

## 2012-06-10 ENCOUNTER — Telehealth: Payer: Self-pay | Admitting: *Deleted

## 2012-06-10 NOTE — Telephone Encounter (Signed)
Patient notified of surgery scheduled for 07-05-12 and pre-op instructions given and will mail copy to patient.

## 2012-06-10 NOTE — Telephone Encounter (Signed)
LMTCB regarding date preferences for procedure.

## 2012-06-15 ENCOUNTER — Ambulatory Visit (INDEPENDENT_AMBULATORY_CARE_PROVIDER_SITE_OTHER): Payer: BC Managed Care – PPO | Admitting: Obstetrics and Gynecology

## 2012-06-15 ENCOUNTER — Encounter: Payer: Self-pay | Admitting: Obstetrics and Gynecology

## 2012-06-15 VITALS — BP 122/60 | Ht 65.0 in | Wt 130.0 lb

## 2012-06-15 DIAGNOSIS — N750 Cyst of Bartholin's gland: Secondary | ICD-10-CM

## 2012-06-15 MED ORDER — OXYCODONE-ACETAMINOPHEN 5-325 MG PO TABS: 1.0000 | ORAL_TABLET | ORAL | Status: DC | PRN

## 2012-06-15 NOTE — Patient Instructions (Addendum)
Keep your preop visit at the hospital.

## 2012-06-15 NOTE — Progress Notes (Signed)
Patient ID: [name], female   DOB: 09/25/1950, 61 y.o.   MRN: [id number]  SUBJECTIVE  Here for preop for right bartholin's gland removal.  Suspicion for possible endometrioma.  No change in health history.  Off all estrogens.  OBJECTIVE  HEENT - /AT. Lungs CTA bilaterally. Cor S1S2 RRR. No murmur, rub, or gallop. Abdomen Pfannenstiel incision.  Soft, nontender, nondistended  No hepatosplenomegaly or organomegaly. Pelvic Right Bartholin's gland 1.5 cm, firm, nontender.  Left Bartholin's gland normal.  Normal urethra.  Cervix absent.  No midline nor adnexal masses or tenderness  ASSESSMENT  Right Bartholin's gland cyst new onset versus endometrioma.  PLAN  Excision of right Bartholin's gland at [facility].  Risks, benefits, alternatives d/w patient who wishes to proceed. 

## 2012-06-20 ENCOUNTER — Encounter (HOSPITAL_COMMUNITY): Payer: Self-pay | Admitting: Pharmacy Technician

## 2012-06-29 ENCOUNTER — Other Ambulatory Visit: Payer: Self-pay

## 2012-06-29 ENCOUNTER — Encounter (HOSPITAL_COMMUNITY): Payer: Self-pay

## 2012-06-29 ENCOUNTER — Telehealth: Payer: Self-pay | Admitting: Obstetrics and Gynecology

## 2012-06-29 ENCOUNTER — Other Ambulatory Visit: Payer: Self-pay | Admitting: Dermatology

## 2012-06-29 ENCOUNTER — Encounter (HOSPITAL_COMMUNITY)
Admission: RE | Admit: 2012-06-29 | Discharge: 2012-06-29 | Disposition: A | Discharge status: Home / Self Care | Payer: BC Managed Care – PPO | Source: Ambulatory Visit | Attending: Obstetrics and Gynecology | Admitting: Obstetrics and Gynecology

## 2012-06-29 HISTORY — DX: Personal history of other medical treatment: Z92.89

## 2012-06-29 LAB — CBC
HCT: 41.2 % (ref 36.0–46.0)
Hemoglobin: 13.7 g/dL (ref 12.0–15.0)
MCH: 33.3 pg (ref 26.0–34.0)
MCHC: 33.3 g/dL (ref 30.0–36.0)
MCV: 100 fL (ref 78.0–100.0)
Platelets: 188 10*3/uL (ref 150–400)
RBC: 4.12 MIL/uL (ref 3.87–5.11)
RDW: 13.2 % (ref 11.5–15.5)
WBC: 5.9 10*3/uL (ref 4.0–10.5)

## 2012-06-29 NOTE — Patient Instructions (Addendum)
   Your procedure is scheduled RU:[id number] May 6th  Enter through the Main Entrance of [facility] at: 6am Pick up the phone at the desk and dial [phone] and inform us of your arrival.  Please call this number if you have any problems the morning of surgery: [phone]  Remember: Do not eat or drink anything after midnight on Monday Please take your dexilant and linzess morning of surgery with sips of water. You may take your levothyroxine day of surgery also  Do not wear jewelry, make-up, or FINGER nail polish No metal in your hair or on your body. Do not wear lotions, powders, perfumes. You may wear deodorant.  Please use your CHG wash as directed prior to surgery.  Do not shave anywhere for at least 12 hours prior to first CHG shower.  Do not bring valuables to the hospital. Bring an eyeglass case to protect your glasses while you are in surgery    Patients discharged on the day of surgery will not be allowed to drive home.

## 2012-06-29 NOTE — Pre-Procedure Instructions (Signed)
Pt has not had EKG in past 6 months -will do one today at pat visit-requested most recent from pcp-Dr Ava

## 2012-06-29 NOTE — Pre-Procedure Instructions (Addendum)
Pt EKG reviewed per Dr Lilli Light to EKG of 2013-shows changes-pt instructed to make appointment with PCP Dr Marisa Hua clearance per PCP per Dr Rodman Pickle. EKG sent with for review. Office called-Sally is out today-spoke with Eber Jones who will get the message to Dr Edward Jolly.

## 2012-06-29 NOTE — Telephone Encounter (Signed)
Pt. is calling to let Dr Edward Jolly know she has an appointment on friday with Dr. Virgel Manifold to clear up the EKG issue before her surgery date .

## 2012-06-30 NOTE — Telephone Encounter (Signed)
Kennon Rounds,  Will you please contact patient to understand the "EKG issue" that needs to be clarified by patient's PCP, Dr Deeann Dowse, prior to patient's surgery next week?  She has an appointment on Friday for this.  Thank you,  Conley Simmonds

## 2012-06-30 NOTE — Telephone Encounter (Signed)
Patient returned my call and I was not available so she left message that she was seeing Dr Virgel Manifold for EKG in am at 915 and would compare that EKG to previous 2 EKG and then determine if anything additional needed before surgery.

## 2012-07-01 NOTE — Telephone Encounter (Signed)
I have received your note about this patient.  Thank you.  [employer]

## 2012-07-01 NOTE — Telephone Encounter (Signed)
FYI: Dr. Felipa Eth office's will call later with EKG results, pt. said she is cleared for surgery. No need to call patient back.

## 2012-07-01 NOTE — Telephone Encounter (Signed)
Call from [name] with Dr Virgel Manifold.  Patient is clear for surgery.  EKG showed no changes from prev exam and nothing that will change surgery. She will have a non-urgent echo to follow up.  They will send [place] a note in EPIC stating she is clear for surgery.

## 2012-07-04 ENCOUNTER — Encounter (HOSPITAL_COMMUNITY): Payer: Self-pay | Admitting: Obstetrics and Gynecology

## 2012-07-05 ENCOUNTER — Encounter (HOSPITAL_COMMUNITY): Payer: Self-pay | Admitting: *Deleted

## 2012-07-05 ENCOUNTER — Ambulatory Visit (HOSPITAL_COMMUNITY)
Admission: RE | Admit: 2012-07-05 | Discharge: 2012-07-05 | Disposition: A | Discharge status: Home / Self Care | Payer: BC Managed Care – PPO | Source: Ambulatory Visit | Attending: Obstetrics and Gynecology | Admitting: Obstetrics and Gynecology

## 2012-07-05 ENCOUNTER — Ambulatory Visit (HOSPITAL_COMMUNITY): Payer: BC Managed Care – PPO | Admitting: Anesthesiology

## 2012-07-05 ENCOUNTER — Encounter (HOSPITAL_COMMUNITY)
Admission: RE | Disposition: A | Discharge status: Home / Self Care | Payer: Self-pay | Source: Ambulatory Visit | Attending: Obstetrics and Gynecology

## 2012-07-05 ENCOUNTER — Encounter (HOSPITAL_COMMUNITY): Payer: Self-pay | Admitting: Anesthesiology

## 2012-07-05 DIAGNOSIS — N750 Cyst of Bartholin's gland: Secondary | ICD-10-CM | POA: Insufficient documentation

## 2012-07-05 HISTORY — PX: BARTHOLIN CYST MARSUPIALIZATION: SHX5383

## 2012-07-05 SURGERY — MARSUPIALIZATION, CYST, BARTHOLIN'S GLAND
Anesthesia: General | Site: Vagina | Laterality: Right | Wound class: Clean Contaminated

## 2012-07-05 MED ORDER — FENTANYL CITRATE 0.05 MG/ML IJ SOLN
INTRAMUSCULAR | Status: DC | PRN
  Administered 2012-07-05: 100 ug via INTRAVENOUS
  Administered 2012-07-05 (×2): 50 ug via INTRAVENOUS

## 2012-07-05 MED ORDER — PROPOFOL 10 MG/ML IV EMUL
INTRAVENOUS | Status: AC
  Filled 2012-07-05: qty 20

## 2012-07-05 MED ORDER — MIDAZOLAM HCL 2 MG/2ML IJ SOLN
INTRAMUSCULAR | Status: AC
  Filled 2012-07-05: qty 2

## 2012-07-05 MED ORDER — OXYCODONE-ACETAMINOPHEN 5-325 MG PO TABS
ORAL_TABLET | ORAL | Status: AC
  Administered 2012-07-05: 1 via ORAL
  Filled 2012-07-05: qty 1

## 2012-07-05 MED ORDER — OXYCODONE-ACETAMINOPHEN 5-325 MG PO TABS: 1.0000 | ORAL_TABLET | ORAL | Status: DC | PRN

## 2012-07-05 MED ORDER — LIDOCAINE HCL (CARDIAC) 20 MG/ML IV SOLN
INTRAVENOUS | Status: DC | PRN
  Administered 2012-07-05: 40 mg via INTRAVENOUS
  Administered 2012-07-05: 60 mg via INTRAVENOUS

## 2012-07-05 MED ORDER — LIDOCAINE HCL 1 % IJ SOLN
INTRAMUSCULAR | Status: DC | PRN
  Administered 2012-07-05: 1 mL

## 2012-07-05 MED ORDER — FENTANYL CITRATE 0.05 MG/ML IJ SOLN: 25.0000 ug | INTRAMUSCULAR | Status: DC | PRN

## 2012-07-05 MED ORDER — MIDAZOLAM HCL 5 MG/5ML IJ SOLN
INTRAMUSCULAR | Status: DC | PRN
  Administered 2012-07-05: 2 mg via INTRAVENOUS

## 2012-07-05 MED ORDER — KETOROLAC TROMETHAMINE 30 MG/ML IJ SOLN
INTRAMUSCULAR | Status: DC | PRN
  Administered 2012-07-05: 30 mg via INTRAVENOUS

## 2012-07-05 MED ORDER — LACTATED RINGERS IV SOLN
INTRAVENOUS | Status: DC
  Administered 2012-07-05 (×2): via INTRAVENOUS

## 2012-07-05 MED ORDER — DEXAMETHASONE SODIUM PHOSPHATE 4 MG/ML IJ SOLN
INTRAMUSCULAR | Status: DC | PRN
  Administered 2012-07-05: 10 mg via INTRAVENOUS

## 2012-07-05 MED ORDER — FENTANYL CITRATE 0.05 MG/ML IJ SOLN
INTRAMUSCULAR | Status: AC
  Filled 2012-07-05: qty 2

## 2012-07-05 MED ORDER — ONDANSETRON HCL 4 MG/2ML IJ SOLN
INTRAMUSCULAR | Status: AC
  Filled 2012-07-05: qty 2

## 2012-07-05 MED ORDER — DEXAMETHASONE SODIUM PHOSPHATE 10 MG/ML IJ SOLN
INTRAMUSCULAR | Status: AC
  Filled 2012-07-05: qty 1

## 2012-07-05 MED ORDER — ONDANSETRON HCL 4 MG/2ML IJ SOLN
INTRAMUSCULAR | Status: DC | PRN
  Administered 2012-07-05: 4 mg via INTRAVENOUS

## 2012-07-05 MED ORDER — LIDOCAINE HCL (CARDIAC) 20 MG/ML IV SOLN
INTRAVENOUS | Status: AC
  Filled 2012-07-05: qty 5

## 2012-07-05 MED ORDER — CEFAZOLIN SODIUM-DEXTROSE 2-3 GM-% IV SOLR
2.0000 g | INTRAVENOUS | Status: AC
  Administered 2012-07-05: 2 g via INTRAVENOUS

## 2012-07-05 MED ORDER — PROPOFOL 10 MG/ML IV EMUL
INTRAVENOUS | Status: DC | PRN
  Administered 2012-07-05 (×3): 20 mg via INTRAVENOUS
  Administered 2012-07-05: 40 mg via INTRAVENOUS
  Administered 2012-07-05 (×5): 20 mg via INTRAVENOUS

## 2012-07-05 MED ORDER — FENTANYL CITRATE 0.05 MG/ML IJ SOLN
INTRAMUSCULAR | Status: AC
  Administered 2012-07-05: 50 ug via INTRAVENOUS
  Filled 2012-07-05: qty 2

## 2012-07-05 MED ORDER — CEFAZOLIN SODIUM-DEXTROSE 2-3 GM-% IV SOLR
INTRAVENOUS | Status: AC
  Filled 2012-07-05: qty 50

## 2012-07-05 SURGICAL SUPPLY — 27 items
BLADE SURG 15 STRL LF C SS BP (BLADE) ×1 IMPLANT
BLADE SURG 15 STRL SS (BLADE) ×1
CLOTH BEACON ORANGE TIMEOUT ST (SAFETY) ×2 IMPLANT
CONTAINER PREFILL 10% NBF 15ML (MISCELLANEOUS) ×2 IMPLANT
COUNTER NEEDLE 1200 MAGNETIC (NEEDLE) IMPLANT
DRESSING TELFA 8X3 (GAUZE/BANDAGES/DRESSINGS) ×2 IMPLANT
ELECT REM PT RETURN 9FT ADLT (ELECTROSURGICAL) ×2
ELECTRODE REM PT RTRN 9FT ADLT (ELECTROSURGICAL) ×1 IMPLANT
GAUZE SPONGE 4X4 12PLY STRL LF (GAUZE/BANDAGES/DRESSINGS) ×4 IMPLANT
GLOVE BIO SURGEON STRL SZ 6.5 (GLOVE) ×2 IMPLANT
GOWN STRL REIN XL XLG (GOWN DISPOSABLE) ×4 IMPLANT
NEEDLE HYPO 25X1 1.5 SAFETY (NEEDLE) ×2 IMPLANT
NEEDLE SPNL 22GX3.5 QUINCKE BK (NEEDLE) ×2 IMPLANT
NS IRRIG 1000ML POUR BTL (IV SOLUTION) ×2 IMPLANT
PACK VAGINAL MINOR WOMEN LF (CUSTOM PROCEDURE TRAY) ×2 IMPLANT
PAD OB MATERNITY 4.3X12.25 (PERSONAL CARE ITEMS) ×2 IMPLANT
PAD PREP 24X48 CUFFED NSTRL (MISCELLANEOUS) ×2 IMPLANT
PENCIL BUTTON HOLSTER BLD 10FT (ELECTRODE) ×2 IMPLANT
SUT VIC AB 0 CT1 27 (SUTURE) ×2
SUT VIC AB 0 CT1 27XBRD ANBCTR (SUTURE) ×2 IMPLANT
SUT VIC AB 0 CT2 27 (SUTURE) ×2 IMPLANT
SUT VIC AB 2-0 SH 27 (SUTURE) ×2
SUT VIC AB 2-0 SH 27XBRD (SUTURE) ×2 IMPLANT
SUT VICRYL RAPIDE 4/0 PS 2 (SUTURE) IMPLANT
SYR CONTROL 10ML LL (SYRINGE) ×2 IMPLANT
TOWEL OR 17X24 6PK STRL BLUE (TOWEL DISPOSABLE) ×4 IMPLANT
WATER STERILE IRR 1000ML POUR (IV SOLUTION) IMPLANT

## 2012-07-05 NOTE — H&P (View-Only) (Signed)
Patient ID: [name], female   DOB: [birth year], 62 y.o.   MRN: [id number]  SUBJECTIVE  Here for preop for right bartholin's gland removal.  Suspicion for possible endometrioma.  No change in health history.  Off all estrogens.  OBJECTIVE  HEENT - [place]/AT. Lungs CTA bilaterally. Cor S1S2 RRR. No murmur, rub, or gallop. Abdomen Pfannenstiel incision.  Soft, nontender, nondistended  No hepatosplenomegaly or organomegaly. Pelvic Right Bartholin's gland 1.5 cm, firm, nontender.  Left Bartholin's gland normal.  Normal urethra.  Cervix absent.  No midline nor adnexal masses or tenderness  ASSESSMENT  Right Bartholin's gland cyst new onset versus endometrioma.  PLAN  Excision of right Bartholin's gland at [facility].  Risks, benefits, alternatives d/w patient who wishes to proceed.

## 2012-07-05 NOTE — Anesthesia Preprocedure Evaluation (Addendum)
Anesthesia Evaluation  Patient identified by MRN, date of birth, ID band Patient awake    Reviewed: Allergy & Precautions, H&P , Patient's Chart, lab work & pertinent test results, reviewed documented beta blocker date and time   Airway Mallampati: II TM Distance: >3 FB Neck ROM: full    Dental no notable dental hx.    Pulmonary  breath sounds clear to auscultation  Pulmonary exam normal       Cardiovascular Rhythm:regular Rate:Normal     Neuro/Psych    GI/Hepatic GERD-  Medicated,  Endo/Other    Renal/GU      Musculoskeletal   Abdominal   Peds  Hematology   Anesthesia Other Findings   Reproductive/Obstetrics                           Anesthesia Physical Anesthesia Plan  ASA: II  Anesthesia Plan: MAC   Post-op Pain Management:    Induction: Intravenous  Airway Management Planned: LMA  Additional Equipment:   Intra-op Plan:   Post-operative Plan:   Informed Consent: I have reviewed the patients History and Physical, chart, labs and discussed the procedure including the risks, benefits and alternatives for the proposed anesthesia with the patient or authorized representative who has indicated his/her understanding and acceptance.   Dental Advisory Given  Plan Discussed with: CRNA and Surgeon  Anesthesia Plan Comments: (  Discussed  general anesthesia, including possible nausea, instrumentation of airway, sore throat,pulmonary aspiration, etc. I asked if the were any outstanding questions, or  concerns before we proceeded. )       Anesthesia Quick Evaluation

## 2012-07-05 NOTE — Anesthesia Postprocedure Evaluation (Addendum)
  Anesthesia Post Note  Patient: [name]  Procedure(s) Performed: Procedure(s) (LRB): BARTHOLIN CYST MARSUPIALIZATION (Right)  Anesthesia type: MAC  Patient location: PACU  Post pain: Pain level controlled  Post assessment: Post-op Vital signs reviewed  Last Vitals:  Filed Vitals:   07/05/12 0830  BP: 100/56  Pulse: 92  Temp: 36.5 C  Resp: 18    Post vital signs: Reviewed  Level of consciousness: sedated  Complications: No apparent anesthesia complications

## 2012-07-05 NOTE — Transfer of Care (Signed)
Immediate Anesthesia Transfer of Care Note  Patient: [name]  Procedure(s) Performed: Procedure(s) with comments: BARTHOLIN CYST MARSUPIALIZATION (Right) - Excision of right Bartholin Gland  Patient Location: PACU  Anesthesia Type:MAC  Level of Consciousness: awake and oriented  Airway & Oxygen Therapy: Patient Spontanous Breathing  Post-op Assessment: Report given to PACU RN and Post -op Vital signs reviewed and stable  Post vital signs: Reviewed and stable  Complications: No apparent anesthesia complications

## 2012-07-05 NOTE — Brief Op Note (Signed)
07/05/2012  8:30 AM  PATIENT:  [name]  62 y.o. female  PRE-OPERATIVE DIAGNOSIS:  Bartholin Gland Cyst CPT [phone]  POST-OPERATIVE DIAGNOSIS:  Bartholin gland cyst  PROCEDURE:  Procedure(s) with comments: BARTHOLIN CYST MARSUPIALIZATION (Right) - Excision of right Bartholin Gland  SURGEON:  Surgeon(s) and Role:    * Melony Overly, MD - Primary  PHYSICIAN ASSISTANT:   ASSISTANTS: none   ANESTHESIA:   IV sedation  EBL:  Total I/O In: 1000 [I.V.:1000] Out: 75 [Blood:75]  BLOOD ADMINISTERED:none  DRAINS: none   LOCAL MEDICATIONS USED:  LIDOCAINE   SPECIMEN:  Source of Specimen:  right bartholin's gland  DISPOSITION OF SPECIMEN:  PATHOLOGY  COUNTS:  YES  TOURNIQUET:  * No tourniquets in log *  DICTATION: .Other Dictation: Dictation Number number not recorded  PLAN OF CARE: Discharge to home after PACU  PATIENT DISPOSITION:  PACU - hemodynamically stable.   Delay start of Pharmacological VTE agent (>24hrs) due to surgical blood loss or risk of bleeding: not applicable

## 2012-07-05 NOTE — Op Note (Signed)
[name], [name] NO.:  [id number]  MEDICAL RECORD NO.:  [id number]  LOCATION:  [facility]                          FACILITY:  [facility]  PHYSICIAN:  Randye Lobo, M.D.   DATE OF BIRTH:  08-30-1950  DATE OF PROCEDURE:  07/05/2012 DATE OF DISCHARGE:  07/05/2012                              OPERATIVE REPORT   PREOPERATIVE DIAGNOSIS:  Right Bartholin's gland cyst.  POSTOPERATIVE DIAGNOSIS:  Right Bartholin's gland cyst.  PROCEDURE:  Excision of right Bartholin's gland.  SURGEON:  Randye Lobo, M.D.  ANESTHESIA:  Local, IV sedation.  IV FLUIDS:  1000 mL Ringer's lactate.  ESTIMATED BLOOD LOSS:  75 mL.  URINE OUTPUT:  Not measured.  COMPLICATIONS:  None.  INDICATIONS FOR THE PROCEDURE:  The [name] is a 62 year old, gravida 1, para 0 Caucasian female, status post total abdominal hysterectomy with bilateral salpingo-oophorectomy for endometriosis, who presented on May 20, 2012, referred by her primary care physician for new onset right painful vulvar cyst.  The [name] was examined in the office and noted to have a 1.5 cm nodular mass in the region of the right Bartholin's gland.  This was drained at that time and was noted to be filled with old blood and clot.  Cytology demonstrated RBCs and no evidence of malignancy.  The [name] was then followed in the office and had persistence of the mass.  During this time, she was not on her estrogen hormone therapy. Due to the persistence and nodularity of the mass despite drainage, a plan was made to proceed with excision.  Risks, benefits, and alternatives were reviewed with the [name] who wished to proceed.  The [name] in the preoperative hold area prior to surgery indicated that the area had become significantly smaller, and I examined her indeed had diminished significantly in size.  I discussed with the [name] the option of continued observation versus excision as the area was so significantly small and  no longer nodular in nature.  The [name] requested to proceed as we discussed that it was possible that this was indeed endometriosis and not just a regular Bartholin gland enlargement. Plans were made to proceed forward.  FINDINGS:  Examination under anesthesia revealed a 1 cm linear thin area in the region of the right Bartholin's gland cyst.  The left Bartholin's gland was palpated and was not enlarged.  SPECIMENS:  The right Bartholin's gland was sent to pathology in pieces.  PROCEDURE:  The [name] was reidentified in the preoperative hold area. She received Ancef for antibiotic prophylaxis, and TED hose and PAS stockings for DVT prophylaxis.  In the operating room, the [name] was placed on the operating table in the dorsal lithotomy position.  She received IV sedation.  The vagina, perineum, and vulva were then sterilely prepped and draped.  The [name] was examined under anesthesia.  The findings are as noted above.  Allis clamps were then used to grasp the skin of the vulva overlying the right Bartholin's gland region.  The area was injected locally with 1% lidocaine plain.  A linear incision was then created extending over the right Bartholin's gland.  Sharp dissection was used to excise the gland  as Allis clamps were used to retract.  Hemostasis was created during the dissection using monopolar cautery.  The dissection continued in a parallel fashion to the vaginal mucosa and inward into the vaginal area. After the gland was entirely excised, it was sent to pathology.  The base of the surgical bed was closed with interrupted sutures of 0 Vicryl to create good hemostasis.  The mucosa of the vagina and the epithelium of the vulva were closed with a running suture of 2-0 Vicryl.  The [name] was cleansed of any remaining Betadine.  She was awakened and escorted to the recovery room.  There were no complications to the procedure.  All needle, instruments, and sponge counts  were correct.     Randye Lobo, M.D.     BES/MEDQ  D:  07/05/2012  T:  07/05/2012  Job:  [id number]

## 2012-07-05 NOTE — Interval H&P Note (Signed)
History and Physical Interval Note:  07/05/2012 7:23 AM  [name]  has presented today for surgery, with the diagnosis of Bartholin Gland Cyst CPT [phone]  The various methods of treatment have been discussed with the patient and family. After consideration of risks, benefits and other options for treatment, the patient has consented to  Procedure(s) with comments: BARTHOLIN CYST MARSUPIALIZATION (N/A) - Excision of right Bartholin Gland as a surgical intervention .  The patient's history has been reviewed, patient examined, no change in status, stable for surgery.  I have reviewed the patient's chart and labs.  Questions were answered to the patient's satisfaction.     [place]

## 2012-07-05 NOTE — Progress Notes (Signed)
Preop Update Note  Patient states that the Bartholin's gland is smaller.  Patient has had consultation with her PCP regarding EKG changes on her preop EKG.  She was cleared for surgery and will have an ECHO post op as an outpatient.  Right Bartholin's confirmed to be significantly smaller.    Right Bartholin's gland cyst vs. Endometrioma.  Patient decides to proceed as she does not want chance of recurrence when she starts her estrogen therapy again.

## 2012-07-06 ENCOUNTER — Encounter (HOSPITAL_COMMUNITY): Payer: Self-pay | Admitting: Obstetrics and Gynecology

## 2012-07-07 ENCOUNTER — Telehealth: Payer: Self-pay | Admitting: Obstetrics and Gynecology

## 2012-07-07 NOTE — Telephone Encounter (Signed)
Phone call to discuss pathology report.  Patient states that she is doing well.  Not needing any pain medication.  Patient is off HRT for at least 4 weeks.  Having hot flashes.  Pathology report - Benign Bartholin gland cyst with minimal inflammation and hemosiderin deposition.  Will try black cohosh and soy to treat hot flashes.  As I suspect some endometriosis effect on the presentation of a bloody Bartholin's gland, I am recommending that the patient not use HRT for at least another month.    Post op visit in 2 weeks.

## 2012-07-20 ENCOUNTER — Ambulatory Visit (INDEPENDENT_AMBULATORY_CARE_PROVIDER_SITE_OTHER): Payer: BC Managed Care – PPO | Admitting: Obstetrics and Gynecology

## 2012-07-20 ENCOUNTER — Other Ambulatory Visit (HOSPITAL_COMMUNITY): Payer: Self-pay | Admitting: Rheumatology

## 2012-07-20 VITALS — BP 110/64 | Wt 132.0 lb

## 2012-07-20 DIAGNOSIS — Z9889 Other specified postprocedural states: Secondary | ICD-10-CM

## 2012-07-20 DIAGNOSIS — M25561 Pain in right knee: Secondary | ICD-10-CM

## 2012-07-20 NOTE — Patient Instructions (Signed)
Please stay off your hormones for four more weeks.

## 2012-07-20 NOTE — Progress Notes (Signed)
Patient ID: [name], female   DOB: [birth year], 62 y.o.   MRN: [id number]  Subjective:  Patient is here for post op incision check. States she notes a suture. No pain. Able to cleanse the area well.  Had a right forearm hematoma from an attempted IV start for surgery. Left had also had a bruise where the IV went in.    Taking black cohosh for menopausal symptoms.  Feels hot and cold.  Decreased libido. Off estrogen therapy for one month so far.  Having colonoscopy 08/22/12.with Dr. Loreta Ave.  Having an MRI this week to evaluate right knee.  Objective  Right forearm - ecchymoses, no induration.  No streaking of the skin. Left hand - no ecchymoses, no induration.  Right vulva - sutures intact in the region of the prior right Bartholin gland.  No erythema.  No drainage.  No masses.  Pathology report - benign right Bartholin's gland with inflammation and hemosiderin.  Assessment  Status post excision of right Bartholin gland. Pathology report suggesting possible endometriosis.  Plan  No hormonal therapy for at least 4 more weeks. Recheck incision in 4 weeks. No intercourse until re-evaluation.

## 2012-07-22 ENCOUNTER — Ambulatory Visit (HOSPITAL_COMMUNITY)
Admission: RE | Admit: 2012-07-22 | Discharge: 2012-07-22 | Disposition: A | Discharge status: Home / Self Care | Payer: BC Managed Care – PPO | Source: Ambulatory Visit | Attending: Rheumatology | Admitting: Rheumatology

## 2012-07-22 DIAGNOSIS — R229 Localized swelling, mass and lump, unspecified: Secondary | ICD-10-CM | POA: Insufficient documentation

## 2012-07-22 DIAGNOSIS — M25561 Pain in right knee: Secondary | ICD-10-CM

## 2012-07-22 DIAGNOSIS — M25469 Effusion, unspecified knee: Secondary | ICD-10-CM | POA: Insufficient documentation

## 2012-07-22 DIAGNOSIS — M25569 Pain in unspecified knee: Secondary | ICD-10-CM | POA: Insufficient documentation

## 2012-07-31 HISTORY — PX: KNEE SURGERY: SHX244

## 2012-08-08 ENCOUNTER — Other Ambulatory Visit: Payer: Self-pay | Admitting: Orthopaedic Surgery

## 2012-08-08 ENCOUNTER — Ambulatory Visit
Admission: RE | Admit: 2012-08-08 | Discharge: 2012-08-08 | Disposition: A | Discharge status: Home / Self Care | Payer: BC Managed Care – PPO | Source: Ambulatory Visit | Attending: Orthopaedic Surgery | Admitting: Orthopaedic Surgery

## 2012-08-08 DIAGNOSIS — M79661 Pain in right lower leg: Secondary | ICD-10-CM

## 2012-08-19 ENCOUNTER — Encounter: Payer: Self-pay | Admitting: Obstetrics and Gynecology

## 2012-08-19 ENCOUNTER — Ambulatory Visit (INDEPENDENT_AMBULATORY_CARE_PROVIDER_SITE_OTHER): Payer: BC Managed Care – PPO | Admitting: Obstetrics and Gynecology

## 2012-08-19 VITALS — BP 120/70 | HR 70 | Wt 133.0 lb

## 2012-08-19 DIAGNOSIS — N952 Postmenopausal atrophic vaginitis: Secondary | ICD-10-CM

## 2012-08-19 MED ORDER — ESTRADIOL 0.1 MG/GM VA CREA: TOPICAL_CREAM | VAGINAL | Status: DC

## 2012-08-19 NOTE — Progress Notes (Signed)
Patient ID: [name], female   DOB: 1950/11/22, 62 y.o.   MRN: [id number]  Subjective  Here for follow up of surgery and discussion of hormone therapy.  Status post excision of right Bartholin's gland on 07/05/12.  Notes some lumpiness of vagina.  Wants to start vaginal estrogen therapy.  Had been on ERT for many years following her hysterectomy and would like to continue off ERT.  Last mammogram normal - April or May 2014 - [facility]. Last AEX January 2014.  Had recent right knee arthroscopic surgery.  Recovering well.  Using a walker.  Objective  Pelvic Vulva well healed.  Vaginal palpation reveals a thickened area where surgical site was. Rectovaginal exam reveals no palpable mass in the area.  Assessment  Status post right Bartholin's gland excision. Suspected endometriosis. Atrophic vaginitis. Has been off all estrogen for months.  Plan  OK to use Estrace cream.  See Epic orders.  Patient instructed in use.  Discussed risks and benefits. I discussed full ERT risks and benefits as well.  Patient declines at this time. AEX in January 2015.

## 2012-08-23 ENCOUNTER — Telehealth: Payer: Self-pay | Admitting: Obstetrics and Gynecology

## 2012-08-23 ENCOUNTER — Other Ambulatory Visit: Payer: Self-pay | Admitting: Orthopedic Surgery

## 2012-08-23 MED ORDER — ESTRADIOL 0.5 MG PO TABS: 0.5000 mg | ORAL_TABLET | Freq: Every day | ORAL | Status: DC

## 2012-08-23 NOTE — Telephone Encounter (Signed)
I have placed an order in Epic for Estrace 0.5 mg orally daily to treat her menopausal symptoms. She will still continue with the Estrace vaginally cream also. The two work well together and will not be too much estrogen.  I have previously discussed risks and benefits of estrogen therapy with the patient at the last office visit.  If menopausal symptoms persist after restarting the Estrace orally, please have the patient call back.  Annual exam in January 2015.

## 2012-08-23 NOTE — Telephone Encounter (Signed)
Spoke with pt who has decided she has had enough of hot flashes and would like to pursue whatever Dr. Edward Jolly suggests. Pt has been using estradiol vaginal cream for vaginal dryness, and would like to continue. What do you rec: for hot flashes for her?

## 2012-08-23 NOTE — Telephone Encounter (Signed)
Spoke with pt about estrace tablet ordered for her. She can take it without problems along with the cream. Pt to call back for an update or any problems. Pt appreciative.

## 2012-08-23 NOTE — Telephone Encounter (Signed)
Patient wants to discuss adding estrace to her medications.

## 2013-01-05 ENCOUNTER — Other Ambulatory Visit: Payer: Self-pay

## 2013-02-01 ENCOUNTER — Ambulatory Visit (HOSPITAL_COMMUNITY): Payer: BC Managed Care – PPO | Attending: Cardiology | Admitting: Radiology

## 2013-02-01 ENCOUNTER — Other Ambulatory Visit: Payer: Self-pay

## 2013-02-01 ENCOUNTER — Other Ambulatory Visit (HOSPITAL_COMMUNITY): Payer: Self-pay | Admitting: Internal Medicine

## 2013-02-01 ENCOUNTER — Encounter: Payer: Self-pay | Admitting: Cardiology

## 2013-02-01 DIAGNOSIS — R9431 Abnormal electrocardiogram [ECG] [EKG]: Secondary | ICD-10-CM | POA: Insufficient documentation

## 2013-02-02 NOTE — Progress Notes (Signed)
Echocardiogram performed.  

## 2013-02-17 ENCOUNTER — Encounter: Payer: Self-pay | Admitting: Obstetrics and Gynecology

## 2013-03-20 ENCOUNTER — Ambulatory Visit: Payer: BC Managed Care – PPO | Admitting: Obstetrics and Gynecology

## 2013-03-23 ENCOUNTER — Other Ambulatory Visit: Payer: Self-pay | Admitting: Obstetrics & Gynecology

## 2013-04-03 ENCOUNTER — Ambulatory Visit (INDEPENDENT_AMBULATORY_CARE_PROVIDER_SITE_OTHER): Payer: BC Managed Care – PPO | Admitting: Obstetrics and Gynecology

## 2013-04-03 ENCOUNTER — Encounter: Payer: Self-pay | Admitting: Obstetrics and Gynecology

## 2013-04-03 VITALS — BP 100/61 | HR 102 | Resp 18 | Wt 131.0 lb

## 2013-04-03 DIAGNOSIS — Z01419 Encounter for gynecological examination (general) (routine) without abnormal findings: Secondary | ICD-10-CM

## 2013-04-03 MED ORDER — ESTRADIOL 0.5 MG PO TABS: 0.5000 mg | ORAL_TABLET | Freq: Every day | ORAL | Status: DC

## 2013-04-03 MED ORDER — ESTRADIOL 0.1 MG/GM VA CREA: TOPICAL_CREAM | VAGINAL | Status: DC

## 2013-04-03 NOTE — Patient Instructions (Signed)

## 2013-04-03 NOTE — Progress Notes (Signed)
GYNECOLOGY VISIT  PCP: Su Grand, MD  Referring provider:   HPI: 63 y.o.   Married  Caucasian  female   [id number] with Patient's last menstrual period was 03/02/1988.   here for   Annual Exam  Taking Estrace 0.5 mg daily and using Estrace vaginal cream. Wants refill on both.  No painful intercourse.   Will do mammogram at [facility] in February. Does self breast exams.   Has a Baker's cyst of the right knee.  Going to [place] in March.  Visiting [place].  Hgb:  PCP Urine:  PCP  GYNECOLOGIC HISTORY: Patient's last menstrual period was 03/02/1988. Sexually active: Yes  Partner preference: Female Contraception:   TAH, Menopausal Menopausal hormone therapy: yes DES exposure:   no Blood transfusions:   yes Sexually transmitted diseases: no   GYN Procedures:  Colpo/Bx , Cryo Mammogram:    04/2012 normal ([facility])             Pap:  ? History of abnormal pap smear:  30 years ago    OB History   Grav Para Term Preterm Abortions TAB SAB Ect Mult Living   1 0 0 0 1 0 1 0 0 0        LIFESTYLE: Exercise: walking dogs 5-7 days a week              Tobacco: no Alcohol: 2 glasses of wine a night Drug use:  no  OTHER HEALTH MAINTENANCE: Tetanus/TDap: 2013 Gardisil: no Influenza:  Yes  (2014) Zostavax: yes    Bone density: 2014 (Osteopenia) - Dr. Dagmar Hait Colonoscopy: 2014 polyps- repeat in 5 years  Cholesterol check: 2014   Family History  Problem Relation Age of Onset  . Colon cancer Father   . Heart disease Father   . Kidney failure Father   . Hypertension Father   . Alcohol abuse Father   . Stroke Mother   . Osteoporosis Mother   . Rheum arthritis Mother   . Dementia Mother   . Hypertension Mother   . Anxiety disorder Brother   . Insomnia Brother   . Depression Brother   . Alcohol abuse Brother     Patient Active Problem List   Diagnosis Date Noted  . Bartholin's duct cyst 05/20/2012  . Vaginal atrophy 08/05/2011  . Arthritis 10/08/2010  . GERD (gastroesophageal  reflux disease) 10/08/2010  . IBS (irritable bowel syndrome) 10/08/2010  . Depression 10/08/2010  . Fibromyalgia 10/08/2010  . Migraines 10/08/2010   Past Medical History  Diagnosis Date  . GERD (gastroesophageal reflux disease)   . IBS (irritable bowel syndrome)   . Mental disorder     depression  . Migraines   . Abnormal Pap smear     hx of colpo and cryo  . Thyroid disease   . Herpes   . Insomnia   . Depression   . Ulcer   . Arthritis     osteoarthritis  . Endometriosis   . Fibromyalgia     muscle spasms, joint pain triggered by stress  . History of blood transfusion 75    [place]  . Arthritis     Past Surgical History  Procedure Laterality Date  . Cervical fusion  2002    x2  . Elbow surgery    . Tonsilectomy, adenoidectomy, bilateral myringotomy and tubes    . Appendectomy      age 89  . Laparoscopy      age 62  . Cataract extraction Bilateral 2012  . Cholecystectomy  2003  . Abdominal hysterectomy  1990  . Bartholin cyst marsupialization Right 07/05/2012    Procedure: BARTHOLIN CYST MARSUPIALIZATION;  Surgeon: Arloa Koh, MD;  Location: [facility];  Service: Gynecology;  Laterality: Right;  Excision of right Bartholin Gland  . Knee surgery Right 07/2012    menicus tear repair    ALLERGIES: Codeine; Sulfa antibiotics; and Adhesive  Current Outpatient Prescriptions  Medication Sig Dispense Refill  . ARIPiprazole (ABILIFY) 2 MG tablet Take 2 mg by mouth every other day.       . Cholecalciferol (VITAMIN D-3) 1000 UNITS CAPS Take by mouth.      . cyclobenzaprine (FLEXERIL) 10 MG tablet Take 10 mg by mouth at bedtime as needed for muscle spasms. Taking 1/2 of 10mg  tab at night      . dexlansoprazole (DEXILANT) 60 MG capsule Take 60 mg by mouth daily.       [name] Calcium (STOOL SOFTENER PO) Take 3 tablets by mouth 2 (two) times daily.      . DULoxetine (CYMBALTA) 60 MG capsule Take 60 mg by mouth at bedtime.       Marland Kitchen estradiol (ESTRACE) 0.1 MG/GM vaginal  cream Use 1/2 g vaginally every night for the first 2 weeks, then use 1/2 g vaginally two or three times per week as needed to maintain symptom relief.  42.5 g  3  . estradiol (ESTRACE) 0.5 MG tablet Take 1 tablet (0.5 mg total) by mouth daily.  30 tablet  11  . ipratropium (ATROVENT) 0.03 % nasal spray 2 (two) times daily.       Marland Kitchen levothyroxine (SYNTHROID, LEVOTHROID) 125 MCG tablet Take 125 mcg by mouth daily.       . Linaclotide (LINZESS) 290 MCG CAPS Take 1 capsule by mouth daily.      Marland Kitchen LORazepam (ATIVAN) 1 MG tablet 2 mg every 8 (eight) hours as needed.       Marland Kitchen MAGNESIUM PO Take 3 tablets by mouth 2 (two) times daily.      . minocycline (DYNACIN) 50 MG tablet Take 50 mg by mouth 2 (two) times daily.       Vladimir Faster Glycol-Propyl Glycol (SYSTANE OP) Place 1 drop into both eyes 2 (two) times daily.      . Probiotic Product (PROBIOTIC DAILY PO) Take 1 capsule by mouth daily.      Marland Kitchen SALINE NASAL MIST NA Place 1 spray into the nose 2 (two) times daily.      . SUMAtriptan (IMITREX) 6 MG/0.5ML SOLN injection Inject 6 mg into the skin every 2 (two) hours as needed for migraine or headache. F      . topiramate (TOPAMAX) 50 MG tablet Take 100-150 mg by mouth See admin instructions. Alternates days takes 100 mg 1 evening and 150 mg the next.      . valACYclovir (VALTREX) 500 MG tablet Take 500 mg by mouth daily.      . Vitamin D, Ergocalciferol, (DRISDOL) 50000 UNITS CAPS Take 50,000 Units by mouth every 7 (seven) days. On Thursdays      . oxyCODONE-acetaminophen (PERCOCET/ROXICET) 5-325 MG per tablet Take 1-2 tablets by mouth every 4 (four) hours as needed for pain.       No current facility-administered medications for this visit.     ROS:  Pertinent items are noted in HPI.  SOCIAL HISTORY:    PHYSICAL EXAMINATION:    BP 100/61  Pulse 102  Resp 18  Wt 131 lb (59.421 kg)  LMP 03/02/1988   Wt Readings from Last 3 Encounters:  04/03/13 131 lb (59.421 kg)  08/19/12 133 lb (60.328 kg)   07/20/12 132 lb (59.875 kg)     Ht Readings from Last 3 Encounters:  06/14/12 5\' 5"  (1.651 m)  06/14/12 5\' 5"  (1.651 m)  06/29/12 5\' 5"  (1.651 m)    General appearance: alert, cooperative and appears stated age Head: Normocephalic, without obvious abnormality, atraumatic Neck: no adenopathy, supple, symmetrical, trachea midline and thyroid not enlarged, symmetric, no tenderness/mass/nodules Lungs: clear to auscultation bilaterally Breasts: Inspection negative, No nipple retraction or dimpling, No nipple discharge or bleeding, No axillary or supraclavicular adenopathy, Normal to palpation without dominant masses Heart: regular rate and rhythm Abdomen: soft, non-tender; no masses,  no organomegaly Extremities: extremities normal, atraumatic, no cyanosis or edema Skin: Skin color, texture, turgor normal. No rashes or lesions Lymph nodes: Cervical, supraclavicular, and axillary nodes normal. No abnormal inguinal nodes palpated Neurologic: Grossly normal  Pelvic: External genitalia:  no lesions              Urethra:  normal appearing urethra with no masses, tenderness or lesions              Bartholins and Skenes: normal                 Vagina: normal appearing vagina with normal color and discharge, no lesions              Cervix: normal appearance              Pap and high risk HPV testing done: no.            Bimanual Exam:  Uterus:   absent                                      Adnexa: no masses                                      Rectovaginal: Confirms                                      Anus:  normal sphincter tone, no lesions.  Tight sphincter.  (Patient states no problems.)  ASSESSMENT  Normal gynecologic exam. Estrogen therapy patient.  Osteopenia.  PLAN  Mammogram in February 2015.   Patient will call to schedule. Pap smear and high risk HPV testing not indicated.  Counseled on Osteoporosis prevention, ERT - risks and benefits.  Wants to continue with Estrace orally  and vaginally. Medications per Epic orders Return annually or prn   An After Visit Summary was printed and given to the patient.

## 2013-04-25 ENCOUNTER — Telehealth: Payer: Self-pay | Admitting: Obstetrics and Gynecology

## 2013-04-25 ENCOUNTER — Encounter: Payer: Self-pay | Admitting: Nurse Practitioner

## 2013-04-25 ENCOUNTER — Ambulatory Visit (INDEPENDENT_AMBULATORY_CARE_PROVIDER_SITE_OTHER): Payer: BC Managed Care – PPO | Admitting: Nurse Practitioner

## 2013-04-25 VITALS — BP 100/64 | HR 76 | Temp 97.7°F | Ht 65.0 in | Wt 132.0 lb

## 2013-04-25 DIAGNOSIS — B373 Candidiasis of vulva and vagina: Secondary | ICD-10-CM

## 2013-04-25 DIAGNOSIS — B3731 Acute candidiasis of vulva and vagina: Secondary | ICD-10-CM

## 2013-04-25 MED ORDER — NYSTATIN-TRIAMCINOLONE 100000-0.1 UNIT/GM-% EX OINT: 1.0000 "application " | TOPICAL_OINTMENT | Freq: Two times a day (BID) | CUTANEOUS | Status: DC

## 2013-04-25 MED ORDER — FLUCONAZOLE 150 MG PO TABS: 150.0000 mg | ORAL_TABLET | Freq: Once | ORAL | Status: DC

## 2013-04-25 NOTE — Patient Instructions (Signed)
Monilial Vaginitis  Vaginitis in a soreness, swelling and redness (inflammation) of the vagina and vulva. Monilial vaginitis is not a sexually transmitted infection.  CAUSES   Yeast vaginitis is caused by yeast (candida) that is normally found in your vagina. With a yeast infection, the candida has overgrown in number to a point that upsets the chemical balance.  SYMPTOMS   · White, thick vaginal discharge.  · Swelling, itching, redness and irritation of the vagina and possibly the lips of the vagina (vulva).  · Burning or painful urination.  · Painful intercourse.  DIAGNOSIS   Things that may contribute to monilial vaginitis are:  · Postmenopausal and virginal states.  · Pregnancy.  · Infections.  · Being tired, sick or stressed, especially if you had monilial vaginitis in the past.  · Diabetes. Good control will help lower the chance.  · Birth control pills.  · Tight fitting garments.  · Using bubble bath, feminine sprays, douches or deodorant tampons.  · Taking certain medications that kill germs (antibiotics).  · Sporadic recurrence can occur if you become ill.  TREATMENT   Your caregiver will give you medication.  · There are several kinds of anti monilial vaginal creams and suppositories specific for monilial vaginitis. For recurrent yeast infections, use a suppository or cream in the vagina 2 times a week, or as directed.  · Anti-monilial or steroid cream for the itching or irritation of the vulva may also be used. Get your caregiver's permission.  · Painting the vagina with methylene blue solution may help if the monilial cream does not work.  · Eating yogurt may help prevent monilial vaginitis.  HOME CARE INSTRUCTIONS   · Finish all medication as prescribed.  · Do not have sex until treatment is completed or after your caregiver tells you it is okay.  · Take warm sitz baths.  · Do not douche.  · Do not use tampons, especially scented ones.  · Wear cotton underwear.  · Avoid tight pants and panty  hose.  · Tell your sexual partner that you have a yeast infection. They should go to their caregiver if they have symptoms such as mild rash or itching.  · Your sexual partner should be treated as well if your infection is difficult to eliminate.  · Practice safer sex. Use condoms.  · Some vaginal medications cause latex condoms to fail. Vaginal medications that harm condoms are:  · Cleocin cream.  · Butoconazole (Femstat®).  · Terconazole (Terazol®) vaginal suppository.  · Miconazole (Monistat®) (may be purchased over the counter).  SEEK MEDICAL CARE IF:   · You have a temperature by mouth above 102° F (38.9° C).  · The infection is getting worse after 2 days of treatment.  · The infection is not getting better after 3 days of treatment.  · You develop blisters in or around your vagina.  · You develop vaginal bleeding, and it is not your menstrual period.  · You have pain when you urinate.  · You develop intestinal problems.  · You have pain with sexual intercourse.  Document Released: 11/26/2004 Document Revised: 05/11/2011 Document Reviewed: 08/10/2008  ExitCare® Patient Information ©2014 [employer].

## 2013-04-25 NOTE — Progress Notes (Signed)
Subjective:     Patient ID: [name], female   DO: 04-12-1950, 63 y.o.   MRM: [id number]  HPI   This 63 yo MW Fe with vaginal symptoms X 2 weeks. States that she feels irritated and burning feeling around the vulva initially.  Now also sore around the clitoral area.  She has not changed soaps or detergents. No recent antibiotics.  Does not have much of a vaginal discharge.  She is using Estrace vaginal cream twice weekly.  Last SA a week ago which did not bother these symptoms.  She thought she may have also worn a pair of jeans that caused the symptoms.  Denies any urinary symptoms. She has had recent incisional of right bartholin gland by Dr. Quincy Simmonds 06/2012.   Review of Systems  Constitutional: Negative for fever, chills and fatigue.  Respiratory: Negative.   Cardiovascular: Negative.   Gastrointestinal: Negative.   Genitourinary: Positive for vaginal discharge. Negative for dysuria, urgency, frequency, hematuria, flank pain, genital sores, pelvic pain and dyspareunia.  Musculoskeletal: Negative.   Skin: Negative.   Neurological: Negative.   Psychiatric/Behavioral: Negative.        Objective:   Physical Exam  Constitutional: She is oriented to person, place, and time. She appears well-developed and well-nourished. No distress.  Abdominal: Soft. She exhibits no distension. There is no tenderness. There is no rebound and no guarding.  Genitourinary:     External vulva with multiple areas of crusting and linear cuts consistent with chronic yeast.  Area is worse at the clitoral hood and border of hair line.  The inside labia minor has distinct coloration of a mole bilaterally.  Patient unaware that this discoloration was present before. This could be a normal variant. Vaginal discharge is thin white.     Wet Prep:  Ph 4.0; NSS negative; KOH + yeast.  Neurological: She is alert and oriented to person, place, and time.  Psychiatric: She has a normal mood and affect. Her behavior is  normal. Judgment and thought content normal.       Assessment:     Yeast vulva vaginitis Areas of discoloration of vulva that may be normal    Plan:     Will start on Diflucan 150 mg X 2 doses Will use Mycolog to vulva area for comfort and aid in healing Will use only clear warm H2O to wash the perineum and she may use Aveeno bath for comfort. Discussed loose fitted clothing for comfort

## 2013-04-25 NOTE — Telephone Encounter (Signed)
Note not needed, patient scheduled appointment.

## 2013-04-27 NOTE — Progress Notes (Signed)
I recommend a recheck by me when treatment for the yeast vulvovaginitis is complete.  Encounter reviewed by Dr. Josefa Half.

## 2013-05-01 ENCOUNTER — Telehealth: Payer: Self-pay | Admitting: Nurse Practitioner

## 2013-05-01 NOTE — Telephone Encounter (Signed)
I called patient to get an update on her yeast infection and to let her know that Dr Quincy Simmonds wanted to recheck these areas that were darker in color.  We had discussed this at the time of visit and she agreed to recheck but I failed to put in a recheck visit.  She is scheduled today by [facility] for a recheck appointment with Dr. Quincy Simmonds.

## 2013-05-01 NOTE — Telephone Encounter (Signed)
Routing to provider for final review. Patient agreeable to disposition. Will close encounter.     

## 2013-05-01 NOTE — Telephone Encounter (Signed)
PG wants this patient to see Dr. Quincy Simmonds for a reck this week or next week there are no available appointments. Please call patient and schedule appointment.

## 2013-05-01 NOTE — Telephone Encounter (Signed)
Spoke with pt about scheduling recheck appt with BS 05-05-13 at 12:30. Pt agreeable.

## 2013-05-05 ENCOUNTER — Ambulatory Visit (INDEPENDENT_AMBULATORY_CARE_PROVIDER_SITE_OTHER): Payer: BC Managed Care – PPO | Admitting: Obstetrics and Gynecology

## 2013-05-05 ENCOUNTER — Encounter: Payer: Self-pay | Admitting: Obstetrics and Gynecology

## 2013-05-05 VITALS — BP 96/69 | HR 82 | Resp 18 | Ht 65.0 in | Wt 131.0 lb

## 2013-05-05 DIAGNOSIS — L819 Disorder of pigmentation, unspecified: Secondary | ICD-10-CM

## 2013-05-05 NOTE — Progress Notes (Signed)
GYNECOLOGY  VISIT   HPI: 63 y.o.   Married  Caucasian  female   [place]unt with Patient's last menstrual period was 03/02/1988.   here for recheck. Patient seen on 04/25/13 for yeast vulvitis. Treated with Diflucan and Mycolog. Thinks she was using the vaginal estrogen cream too much. Was using an entire applicator each time.   Was noted to have some dark coloring of the labia bilaterally. Had a left vulvar biopsy in 2012 which was benign.   GYNECOLOGIC HISTORY: Patient's last menstrual period was 03/02/1988. Contraception:   NA Menopausal hormone therapy: Estrace 0.5 mg orally and Estrace vaginal cream.         OB History   Grav Para Term Preterm Abortions TAB SAB Ect Mult Living   1 0   1  1            Patient Active Problem List   Diagnosis Date Noted  . Vaginal atrophy 08/05/2011  . Arthritis 10/08/2010  . GERD (gastroesophageal reflux disease) 10/08/2010  . IBS (irritable bowel syndrome) 10/08/2010  . Depression 10/08/2010  . Fibromyalgia 10/08/2010  . Migraines 10/08/2010    Past Medical History  Diagnosis Date  . GERD (gastroesophageal reflux disease)   . IBS (irritable bowel syndrome)   . Mental disorder     depression  . Migraines   . Abnormal Pap smear     hx of colpo and cryo  . Thyroid disease   . Herpes   . Insomnia   . Depression   . Ulcer   . Arthritis     osteoarthritis  . Endometriosis   . Fibromyalgia     muscle spasms, joint pain triggered by stress  . History of blood transfusion 75    [place]  . Arthritis   . Osteopenia   . Meniscus tear     Right knee    Past Surgical History  Procedure Laterality Date  . Cervical fusion  2002    x2  . Elbow surgery    . Tonsilectomy, adenoidectomy, bilateral myringotomy and tubes    . Appendectomy      age 89  . Laparoscopy      age 71  . Cataract extraction Bilateral 2012  . Cholecystectomy  2003  . Abdominal hysterectomy  1990  . Bartholin cyst marsupialization Right 07/05/2012   Procedure: BARTHOLIN CYST MARSUPIALIZATION;  Surgeon: Arloa Koh, MD;  Location: [facility];  Service: Gynecology;  Laterality: Right;  Excision of right Bartholin Gland  . Knee surgery Right 07/2012    menicus tear repair  . Gynecologic cryosurgery    . Colposcopy w/ biopsy / curettage      30 years ago    Current Outpatient Prescriptions  Medication Sig Dispense Refill  . ARIPiprazole (ABILIFY) 2 MG tablet Take 2 mg by mouth every other day.       . Cholecalciferol (VITAMIN D-3) 1000 UNITS CAPS Take by mouth.      . cyclobenzaprine (FLEXERIL) 10 MG tablet Take 10 mg by mouth at bedtime as needed for muscle spasms. Taking 1/2 of 10mg  tab at night      . dexlansoprazole (DEXILANT) 60 MG capsule Take 60 mg by mouth daily.       [name] Calcium (STOOL SOFTENER PO) Take 3 tablets by mouth 2 (two) times daily.      . DULoxetine (CYMBALTA) 60 MG capsule Take 60 mg by mouth at bedtime.       Marland Kitchen estradiol (ESTRACE) 0.1 MG/GM  vaginal cream Use 1/2 g vaginally every night for the first 2 weeks, then use 1/2 g vaginally two or three times per week as needed to maintain symptom relief.  42.5 g  3  . estradiol (ESTRACE) 0.5 MG tablet Take 1 tablet (0.5 mg total) by mouth daily.  30 tablet  11  . ipratropium (ATROVENT) 0.03 % nasal spray 2 (two) times daily.       Marland Kitchen levothyroxine (SYNTHROID, LEVOTHROID) 125 MCG tablet Take 125 mcg by mouth daily.       . Linaclotide (LINZESS) 290 MCG CAPS Take 1 capsule by mouth daily.      Marland Kitchen LORazepam (ATIVAN) 1 MG tablet 2 mg every 8 (eight) hours as needed.       Marland Kitchen MAGNESIUM PO Take 3 tablets by mouth 2 (two) times daily.      . minocycline (DYNACIN) 50 MG tablet Take 50 mg by mouth 2 (two) times daily.       Vladimir Faster Glycol-Propyl Glycol (SYSTANE OP) Place 1 drop into both eyes 2 (two) times daily.      . Probiotic Product (PROBIOTIC DAILY PO) Take 1 capsule by mouth daily.      . SUMAtriptan (IMITREX) 6 MG/0.5ML SOLN injection Inject 6 mg into the skin every 2  (two) hours as needed for migraine or headache. F      . topiramate (TOPAMAX) 50 MG tablet Take 100-150 mg by mouth See admin instructions. Alternates days takes 100 mg 1 evening and 150 mg the next.      . traMADol (ULTRAM) 50 MG tablet as needed.       . valACYclovir (VALTREX) 500 MG tablet Take 500 mg by mouth daily.      . Vitamin D, Ergocalciferol, (DRISDOL) 50000 UNITS CAPS Take 50,000 Units by mouth every 7 (seven) days. On Thursdays      . celecoxib (CELEBREX) 200 MG capsule Take 200 mg by mouth daily.       Marland Kitchen SALINE NASAL MIST NA Place 1 spray into the nose 2 (two) times daily.       No current facility-administered medications for this visit.     ALLERGIES: Codeine and Sulfa antibiotics  Family History  Problem Relation Age of Onset  . Colon cancer Father   . Heart disease Father   . Kidney failure Father   . Hypertension Father   . Alcohol abuse Father   . Stroke Mother   . Osteoporosis Mother   . Rheum arthritis Mother   . Dementia Mother   . Hypertension Mother   . Anxiety disorder Brother   . Insomnia Brother   . Depression Brother   . Alcohol abuse Brother     History   Social History  . Marital Status: Married    Spouse Name: N/A    Number of Children: N/A  . Years of Education: N/A   Occupational History  . Not on file.   Social History Main Topics  . Smoking status: Never Smoker   . Smokeless tobacco: Never Used  . Alcohol Use: 7.2 - 8.4 oz/week    12-14 Glasses of wine per week     Comment: 2 glasses of wine at night  . Drug Use: No  . Sexual Activity: Yes    Partners: Male    Birth Control/ Protection: Surgical     Comment: TAH   Other Topics Concern  . Not on file   Social History Narrative  . No narrative on file  ROS:  Pertinent items are noted in HPI.  PHYSICAL EXAMINATION:    BP 96/69  Pulse 82  Resp 18  Ht 5\' 5"  (1.651 m)  Wt 131 lb (59.421 kg)  BMI 21.80 kg/m2  LMP 03/02/1988     General appearance: alert,  cooperative and appears stated age  Pelvic: External genitalia:  Left mons with 3 mm medium brown nevus. Superior to mons - 1.0 cm area of seborrheaic keratosis.               Labia majora - no erythema or lesions.               Labia minora - medial surfaces with light to medium brown staining of epithelium, somewhat patchy, nonraised                    ASSESSMENT  Yeast vulvitis resolved. Seborrheic keratosis. Hyperpigmentation of the vulva. No concern for dysplasia.  PLAN  Reduce Estrace cream to 1/2 gram twice a week. I discussed over the counter yeast treatment with Gynelotrimin or Monistat if needed in future. Return for vulvar biopsy if changes to vulvar areas in size or darkening of color.  Patient has dermatologist and may also choose to have another opinion regarding skin pigmentation.  Return prn.   An After Visit Summary was printed and given to the patient.  _15_____ minutes face to face time of which over 50% was spent in counseling.

## 2013-05-05 NOTE — Patient Instructions (Signed)
Please call if the pigmentation on your vulva becomes larger in size or darker in color.

## 2013-07-31 ENCOUNTER — Telehealth: Payer: Self-pay | Admitting: Obstetrics and Gynecology

## 2013-07-31 NOTE — Telephone Encounter (Signed)
Message left to return call to [name] at [phone].    

## 2013-07-31 NOTE — Telephone Encounter (Signed)
Patient returned call and she agreed to appointment with Dr. Quincy Simmonds as offered. Advised to return call if any increase in bleeding or concerns prior to appointment.   Routing to provider for final review. Patient agreeable to disposition. Will close encounter

## 2013-07-31 NOTE — Telephone Encounter (Signed)
Patient has been spotting and is 63 years old. Patient would like an appointment today if possible.

## 2013-07-31 NOTE — Telephone Encounter (Signed)
Spoke with patient. She states that she has had vaginal spotting "twice out of the blue."  First episode "a couple of days ago" and second episode this morning after intercourse. She describes it as "very small dots of blood". Patient denies dysuria or dyspareunia. Patient currently on oral estrace with no missed doses and on vaginal estrogen for atrophic vaginitis. Offered patient office visit with first available provider, patient declines, requests specifically to see Dr. Quincy Simmonds.  Patient states that if Dr. Quincy Simmonds thinks she she should see another provider, she will but not until Dr. Quincy Simmonds agrees. Patient states she is going out of town on Friday and would like to be seen before then.

## 2013-07-31 NOTE — Telephone Encounter (Signed)
I suggest an appointment on 08/02/13 at 7:30 am with me.

## 2013-08-02 ENCOUNTER — Encounter: Payer: BC Managed Care – PPO | Admitting: Obstetrics and Gynecology

## 2013-08-03 ENCOUNTER — Encounter: Payer: Self-pay | Admitting: Obstetrics and Gynecology

## 2013-08-03 ENCOUNTER — Other Ambulatory Visit: Payer: BC Managed Care – PPO

## 2013-08-03 ENCOUNTER — Ambulatory Visit (INDEPENDENT_AMBULATORY_CARE_PROVIDER_SITE_OTHER): Payer: BC Managed Care – PPO | Admitting: Obstetrics and Gynecology

## 2013-08-03 VITALS — BP 114/62 | HR 80 | Ht 65.0 in | Wt 133.6 lb

## 2013-08-03 DIAGNOSIS — N939 Abnormal uterine and vaginal bleeding, unspecified: Secondary | ICD-10-CM

## 2013-08-03 DIAGNOSIS — N898 Other specified noninflammatory disorders of vagina: Secondary | ICD-10-CM

## 2013-08-03 LAB — POCT URINALYSIS DIPSTICK
Bilirubin, UA: NEGATIVE
Blood, UA: NEGATIVE
Glucose, UA: NEGATIVE
Ketones, UA: NEGATIVE
Leukocytes, UA: NEGATIVE
Nitrite, UA: NEGATIVE
Protein, UA: NEGATIVE
Urobilinogen, UA: NEGATIVE
pH, UA: 7

## 2013-08-03 NOTE — Progress Notes (Signed)
Patient ID: [name], female   DOB: 02/23/1951, 63 y.o.   MRN: [id number] GYNECOLOGY VISIT  PCP:  Berneta Sages, MD  Referring provider:   HPI: 63 y.o.   Married  Caucasian  female   [place] with Patient's last menstrual period was 03/02/1988.   here for vaginal bleeding.   Status post TAH/BSO for endometriosis.   Occurred 6 days spontaneously. Occurred again after but after intercourse 4 days ago.  No pain with these episodes of bleeding.   Can have pain with initial penetration.  Using 1/2 gram Estrace vaginal cream twice weekly.  Using digit to place.  Also taking oral Estradiol.  History of endometriosis.  Some upper abdominal puffiness.  Bowel movements regular.   Bilateral lower extremity tingling, right greater than left.  Just had normal exam with PCP.  Having right knee replacement October 12.   Hgb:  --- Urine: negative   GYNECOLOGIC HISTORY: Patient's last menstrual period was 03/02/1988. Sexually active:  yes Partner preference: female Contraception:  TAH/postmenopausal  Menopausal hormone therapy: Estradiol 0.1mg  DES exposure:   no Blood transfusions:  yes  Sexually transmitted diseases: no   GYN procedures and prior surgeries:  Colpo/Bx and cryo 1985, Laparoscopy, TAH 1990 Last mammogram: 04-28-13 VOZ:[id number]                Last pap and high risk HPV testing:  ? 1990 History of abnormal pap smear: 1985 hx of colposcopy and cryo to cervix.    OB History   Grav Para Term Preterm Abortions TAB SAB Ect Mult Living   1 0   1  1          LIFESTYLE: Exercise:               Tobacco:  Alcohol: Drug use:    Patient Active Problem List   Diagnosis Date Noted  . Vaginal atrophy 08/05/2011  . Arthritis 10/08/2010  . GERD (gastroesophageal reflux disease) 10/08/2010  . IBS (irritable bowel syndrome) 10/08/2010  . Depression 10/08/2010  . Fibromyalgia 10/08/2010  . Migraines 10/08/2010    Past Medical History  Diagnosis Date  . GERD (gastroesophageal  reflux disease)   . IBS (irritable bowel syndrome)   . Mental disorder     depression  . Migraines   . Abnormal Pap smear     hx of colpo and cryo  . Thyroid disease   . Herpes   . Insomnia   . Depression   . Ulcer   . Arthritis     osteoarthritis  . Endometriosis   . Fibromyalgia     muscle spasms, joint pain triggered by stress  . History of blood transfusion 63    [place]  . Arthritis   . Osteopenia   . Meniscus tear     Right knee    Past Surgical History  Procedure Laterality Date  . Cervical fusion  2002    x2  . Elbow surgery    . Tonsilectomy, adenoidectomy, bilateral myringotomy and tubes    . Appendectomy      age 63  . Laparoscopy      age 63  . Cataract extraction Bilateral 2012  . Cholecystectomy  2003  . Abdominal hysterectomy  1990  . Bartholin cyst marsupialization Right 07/05/2012    Procedure: BARTHOLIN CYST MARSUPIALIZATION;  Surgeon: Arloa Koh, MD;  Location: [facility];  Service: Gynecology;  Laterality: Right;  Excision of right Bartholin Gland  . Knee surgery Right 07/2012  menicus tear repair  . Gynecologic cryosurgery    . Colposcopy w/ biopsy / curettage      30 years ago    Current Outpatient Prescriptions  Medication Sig Dispense Refill  . ARIPiprazole (ABILIFY) 2 MG tablet Take 2 mg by mouth every other day.       . celecoxib (CELEBREX) 200 MG capsule Take 200 mg by mouth daily.       . Cholecalciferol (VITAMIN D-3) 1000 UNITS CAPS Take by mouth.      . cyclobenzaprine (FLEXERIL) 10 MG tablet Take 10 mg by mouth at bedtime as needed for muscle spasms. Taking 1/2 of 10mg  tab at night      . dexlansoprazole (DEXILANT) 60 MG capsule Take 60 mg by mouth daily.       [name] Calcium (STOOL SOFTENER PO) Take 3 tablets by mouth 2 (two) times daily.      . DULoxetine (CYMBALTA) 60 MG capsule Take 60 mg by mouth at bedtime.       Marland Kitchen estradiol (ESTRACE) 0.1 MG/GM vaginal cream Use 1/2 g vaginally every night for the first 2 weeks, then  use 1/2 g vaginally two or three times per week as needed to maintain symptom relief.  42.5 g  3  . estradiol (ESTRACE) 0.5 MG tablet Take 1 tablet (0.5 mg total) by mouth daily.  30 tablet  11  . ipratropium (ATROVENT) 0.03 % nasal spray 2 (two) times daily.       Marland Kitchen levothyroxine (SYNTHROID, LEVOTHROID) 125 MCG tablet Take 125 mcg by mouth daily.       . Linaclotide (LINZESS) 290 MCG CAPS Take 1 capsule by mouth daily.      Marland Kitchen LORazepam (ATIVAN) 1 MG tablet 2 mg every 8 (eight) hours as needed.       Marland Kitchen MAGNESIUM PO Take 3 tablets by mouth 2 (two) times daily.      . minocycline (DYNACIN) 50 MG tablet Take 50 mg by mouth 2 (two) times daily.       Vladimir Faster Glycol-Propyl Glycol (SYSTANE OP) Place 1 drop into both eyes 2 (two) times daily.      . Probiotic Product (PROBIOTIC DAILY PO) Take 1 capsule by mouth daily.      Marland Kitchen SALINE NASAL MIST NA Place 1 spray into the nose 2 (two) times daily.      . SUMAtriptan (IMITREX) 6 MG/0.5ML SOLN injection Inject 6 mg into the skin every 2 (two) hours as needed for migraine or headache. F      . topiramate (TOPAMAX) 50 MG tablet Take 100-150 mg by mouth See admin instructions. Alternates days takes 100 mg 1 evening and 150 mg the next.      . traMADol (ULTRAM) 50 MG tablet as needed.       . valACYclovir (VALTREX) 500 MG tablet Take 500 mg by mouth daily.      . Vitamin D, Ergocalciferol, (DRISDOL) 50000 UNITS CAPS Take 50,000 Units by mouth every 7 (seven) days. On Thursdays       No current facility-administered medications for this visit.     ALLERGIES: Codeine and Sulfa antibiotics  Family History  Problem Relation Age of Onset  . Colon cancer Father   . Heart disease Father   . Kidney failure Father   . Hypertension Father   . Alcohol abuse Father   . Stroke Mother   . Osteoporosis Mother   . Rheum arthritis Mother   . Dementia Mother   .  Hypertension Mother   . Anxiety disorder Brother   . Insomnia Brother   . Depression Brother   .  Alcohol abuse Brother     History   Social History  . Marital Status: Married    Spouse Name: N/A    Number of Children: N/A  . Years of Education: N/A   Occupational History  . Not on file.   Social History Main Topics  . Smoking status: Never Smoker   . Smokeless tobacco: Never Used  . Alcohol Use: 7.2 - 8.4 oz/week    12-14 Glasses of wine per week     Comment: 2 glasses of wine at night  . Drug Use: No  . Sexual Activity: Yes    Partners: Male    Birth Control/ Protection: Surgical     Comment: TAH   Other Topics Concern  . Not on file   Social History Narrative  . No narrative on file    ROS:  Pertinent items are noted in HPI.  PHYSICAL EXAMINATION:    BP 114/62  Pulse 80  Ht 5\' 5"  (1.651 m)  Wt 133 lb 9.6 oz (60.601 kg)  BMI 22.23 kg/m2  LMP 03/02/1988   Wt Readings from Last 3 Encounters:  08/03/13 133 lb 9.6 oz (60.601 kg)  08/02/13 133 lb (60.328 kg)  05/05/13 131 lb (59.421 kg)     Ht Readings from Last 3 Encounters:  08/03/13 5\' 5"  (1.651 m)  05/05/13 5\' 5"  (1.651 m)  04/25/13 5\' 5"  (1.651 m)    General appearance: alert, cooperative and appears stated age Abdomen: soft, non-tender; no masses,  no organomegaly No abnormal inguinal nodes palpated Neurologic: Grossly normal  Pelvic: External genitalia:  no lesions              Urethra:  normal appearing urethra with no masses, tenderness or lesions              Bartholins and Skenes: normal                 Vagina: normal appearing vagina with normal color and discharge, no lesions, normal rugae. Introitus is tight but allows two finger breaths.               Cervix:  absent                 Bimanual Exam:  Uterus:  absent                                      Adnexa: no masses                                      Rectovaginal: Confirms                                      Anus:  normal sphincter tone, no lesions  ASSESSMENT  Vaginal bleeding.  I believe that this is likely to use of the  vaginal estrogen cream and potential irritation of the vaginal wall with digital placement of the cream. No evidence of atrophy.  No evidence of endometriosis.  Potential neuropathy.   PLAN  Reassurance given.  OK to use Estrace 1/2 gram per vagina at hs three times  a week prn.  I discussed massaging the estrogen into the introitus area to allow for improved elasticity.    An After Visit Summary was printed and given to the patient.  25 minutes face to face time of which over 50% was spent in counseling.

## 2013-08-03 NOTE — Patient Instructions (Signed)
Please call for recurrent vaginal bleeding.

## 2013-08-09 NOTE — Progress Notes (Signed)
This encounter was created in error - please disregard.

## 2013-10-24 ENCOUNTER — Other Ambulatory Visit: Payer: Self-pay | Admitting: Obstetrics and Gynecology

## 2013-10-24 NOTE — Telephone Encounter (Signed)
Patient  request refills of estradiol (ESTRACE) 0.1 MG/GM vaginal cream, estradiol (ESTRACE) 0.5 MG tablet and valACYclovir (VALTREX) 500 MG tablet send to [facility] fx: [phone]  ,

## 2013-10-25 MED ORDER — VALACYCLOVIR HCL 500 MG PO TABS: 500.0000 mg | ORAL_TABLET | Freq: Every day | ORAL | Status: DC

## 2013-10-25 MED ORDER — ESTRADIOL 0.5 MG PO TABS: 0.5000 mg | ORAL_TABLET | Freq: Every day | ORAL | Status: DC

## 2013-10-25 MED ORDER — ESTRADIOL 0.1 MG/GM VA CREA: TOPICAL_CREAM | VAGINAL | Status: DC

## 2013-10-25 NOTE — Telephone Encounter (Signed)
S/w patient she is aware that rx's have been sent to [facility].

## 2013-10-25 NOTE — Telephone Encounter (Signed)
Last refilled: 04/03/13  -Estradiol 0.5 mg #30/11 refills -Estrace Cream 0.1 mg #42.5 gm/3 refills Last AEX: 04/03/13 with Dr. Quincy Simmonds AEX Scheduled: 2.5.16 with Dr. Thornell Mule both Estradiol & Estrace cream to [facility] mail order  #90/2 refills and  #42.5 gm/2 refills.  Valtrex Last filled 03/23/13 by a Clovia Cuff, MD  Please Advise refills.

## 2013-10-26 ENCOUNTER — Other Ambulatory Visit (HOSPITAL_COMMUNITY): Payer: Self-pay | Admitting: Internal Medicine

## 2013-10-26 ENCOUNTER — Ambulatory Visit (HOSPITAL_COMMUNITY)
Admission: RE | Admit: 2013-10-26 | Discharge: 2013-10-26 | Disposition: A | Discharge status: Home / Self Care | Payer: BC Managed Care – PPO | Source: Ambulatory Visit | Attending: Vascular Surgery | Admitting: Vascular Surgery

## 2013-10-26 DIAGNOSIS — I6529 Occlusion and stenosis of unspecified carotid artery: Secondary | ICD-10-CM | POA: Diagnosis not present

## 2013-10-26 DIAGNOSIS — R42 Dizziness and giddiness: Secondary | ICD-10-CM

## 2013-10-27 ENCOUNTER — Other Ambulatory Visit: Payer: Self-pay

## 2013-10-27 MED ORDER — ESTRADIOL 0.5 MG PO TABS: 0.5000 mg | ORAL_TABLET | Freq: Every day | ORAL | Status: DC

## 2013-10-27 MED ORDER — ESTRADIOL 0.1 MG/GM VA CREA: TOPICAL_CREAM | VAGINAL | Status: DC

## 2013-10-27 NOTE — Telephone Encounter (Signed)
Called pharmacy stating its ok to dispense both rx Encounter closed

## 2013-10-27 NOTE — Telephone Encounter (Signed)
[facility] faxed wanting to verify ok to fill and dispense both rx at the same times  Is that ok? If so, I will call the pharmacy

## 2013-11-01 DIAGNOSIS — F4323 Adjustment disorder with mixed anxiety and depressed mood: Secondary | ICD-10-CM | POA: Diagnosis not present

## 2013-11-07 DIAGNOSIS — F4323 Adjustment disorder with mixed anxiety and depressed mood: Secondary | ICD-10-CM | POA: Diagnosis not present

## 2013-11-14 ENCOUNTER — Other Ambulatory Visit: Payer: Self-pay | Admitting: Orthopedic Surgery

## 2013-11-14 NOTE — H&P (Signed)
[name] DOB: 26-Jun-1950 Single / Language: [name] / Race: White Female Date of Admission:  12/11/2013 Chief Complaint:  Right Knee Pain History of Present Illness The patient is a 63 year old female who comes in for a preoperative History and Physical. The patient is scheduled for a right total knee arthroplasty to be performed by Dr. Dione Plover. Aluisio, MD at [facility] on 12-11-2013. The patient reports right knee symptoms including: pain which began 9 month(s) or more ago following a specific injury (Patient states that she had surgery last June for a torn meniscus. She said that it "stepped funny" and caused this. She said that after her surgery her knee continued to hurt. She has had a cortisone injection. The pain is on both sides. She has been taking tramadol only as needed.).The patient feels that the symptoms are worsening. Symptoms are exacerbated by weight bearing. She states that the right knee is hurting her at most times now. Dr. Hilton Cork did an arthroscopy June 2014 for torn medial meniscus. Unfortunately, she has gotten worse since the arthroscopy. She feels as though the knee is now hurting at all times. The pain is medial and lateral. She had a second MRI performed in February of this year which showed a medial meniscal tear as well as severe degenerative change in the lateral compartment. The knee is hurting with most activities. She does have some pain at rest. It is starting to limit what she can and cannot do. She likes to walk and is unable to go on walks anymore because of the knee pain. She has had cortisone injection and had viscosupplement in the past. The injections have not helped. she is ready now to get the knee fixed. It is felt that due to the severe degenerative arthritic changes in the knee, the patient wold benefit from undergoing a total knee replacement. They have been treated conservatively in the past for the above stated problem and despite  conservative measures, they continue to have progressive pain and severe functional limitations and dysfunction. They have failed non-operative management including home exercise, medications, and injections. It is felt that they would benefit from undergoing total joint replacement. Risks and benefits of the procedure have been discussed with the patient and they elect to proceed with surgery. There are no active contraindications to surgery such as ongoing infection or rapidly progressive neurological disease.  Allergies Sulfa Drugs- Headache. NSAIDs- GI upset Codeine Sulfate OPIOIDs - Nausea **Able to take Hydrocodone and Oxycodone in the past**  Problem List/Past Medical  Primary osteoarthritis of one knee (715.16  M17.10) Osteoarthritis of right knee (715.96  M17.9) Skin Cancer Osteoarthritis Hypothyroidism Gastroesophageal Reflux Disease Hiatal Hernia Fibromyalgia Migraine Headache Irritable bowel syndrome Depression Autoimmune disorder Migraine Headache Tinnitus Osteoporosis Menopause  Family History Osteoarthritis Brother, Mother. Kidney disease Father. Hypertension Father. Rheumatoid Arthritis Mother. Osteoporosis Mother. Chronic Obstructive Lung Disease Mother. Cerebrovascular Accident Maternal Grandfather, Mother. Cancer Father. Heart Disease Father, Mother. Drug / Alcohol Addiction Father. Depression Father, Mother.  Social History Children 2 Exercise Exercises rarely; does other Current work status retired Current drinker 05/18/2013: Currently drinks wine 8-14 times per week No history of drug/alcohol rehab Marital status married Living situation live with spouse Tobacco use Never smoker. 05/18/2013 Number of flights of stairs before winded 2-3 Not under pain contract  Medication History Minocycline HCl (50MG  Capsule, Oral two times daily) Active. Cymbalta (60MG  Capsule DR Part, Oral) Active. Abilify (2MG   Tablet, Oral) Active. Cyclobenzaprine HCl (10MG  Tablet, Oral) Active.  LORazepam (1MG  Tablet, Oral) Active. Estrace (0.1MG /GM Cream, Vaginal) Active. Dexilant (60MG  Capsule DR, Oral) Active. Linzess (290MCG Capsule, Oral) Active. Paisley (170MG  Capsule, Oral) Active. Valtrex (500MG  Tablet, Oral) Active. Topamax (15MG  Cap Sprinkle, Oral) Active. Levothyroxine Sodium (125MCG Tablet, Oral) Active. Vitamin D (Ergocalciferol) (50000UNIT Capsule, Oral) Active. Imitrex (Oral) Specific dose unknown - Active. Magnesium (Oral) Specific dose unknown - Active.  Past Surgical History Hysterectomy Date: 55. complete (non-cancerous) Hemorrhoidectomy Gallbladder Surgery laporoscopic Tonsillectomy Date: 1952. Neck Disc Surgery Cervical Fusion Cataract Surgery Date: 2014. bilateral Arthroscopy of Knee Date: 2014. right Appendectomy Date: 46. Along with the Hysterectomy Foot Surgery Bunion Left Foot Dilation and Curettage of Uterus Colon Polyp Removal - Colonoscopy Right Elbow Tendon Repair  Review of Systems General Not Present- Chills, Fatigue, Fever, Memory Loss, Night Sweats, Weight Gain and Weight Loss. Skin Not Present- Eczema, Hives, Itching, Lesions and Rash. HEENT Not Present- Dentures, Double Vision, Headache, Hearing Loss, Tinnitus and Visual Loss. Respiratory Not Present- Allergies, Chronic Cough, Coughing up blood, Shortness of breath at rest and Shortness of breath with exertion. Cardiovascular Not Present- Chest Pain, Difficulty Breathing Lying Down, Murmur, Palpitations, Racing/skipping heartbeats and Swelling. Gastrointestinal Not Present- Abdominal Pain, Bloody Stool, Constipation, Diarrhea, Difficulty Swallowing, Heartburn, Jaundice, Loss of appetitie, Nausea and Vomiting. Female Genitourinary Not Present- Blood in Urine, Discharge, Flank Pain, Incontinence, Painful Urination, Urgency, Urinary frequency, Urinary Retention, Urinating at  Night and Weak urinary stream. Musculoskeletal Not Present- Back Pain, Joint Pain, Joint Swelling, Morning Stiffness, Muscle Pain, Muscle Weakness and Spasms. Neurological Not Present- Blackout spells, Difficulty with balance, Dizziness, Paralysis, Tremor and Weakness. Psychiatric Not Present- Insomnia.  Vitals Weight: 132 lb Height: 65in Weight was reported by patient. Height was reported by patient. Body Surface Area: 1.66 m Body Mass Index: 21.97 kg/m BP: 114/70 (Sitting, Right Arm, Standard)  Physical Exam General Mental Status -Alert, cooperative and good historian. General Appearance-pleasant, Not in acute distress. Orientation-Oriented X3. Build & Nutrition-Petite, Well nourished and Well developed.  Head and Neck Head-normocephalic, atraumatic . Neck Global Assessment - supple, no bruit auscultated on the right, no bruit auscultated on the left.  Eye Vision-Wears corrective lenses. Pupil - Bilateral-Regular and Round. Motion - Bilateral-EOMI.  Chest and Lung Exam Auscultation Breath sounds - clear at anterior chest wall and clear at posterior chest wall. Adventitious sounds - No Adventitious sounds.  Cardiovascular Auscultation Rhythm - Regular rate and rhythm. Heart Sounds - S1 WNL and S2 WNL. Murmurs & Other Heart Sounds - Auscultation of the heart reveals - No Murmurs.  Abdomen Palpation/Percussion Tenderness - Abdomen is non-tender to palpation. Rigidity (guarding) - Abdomen is soft. Auscultation Auscultation of the abdomen reveals - Bowel sounds normal.  Musculoskeletal Note: General: A very pleasant well developed female. Alert and oriented, in no apparent distress.  Musculoskeletal: Both hips show normal range of motion with no discomfort. Extremities: Her left knee shows no effusion. Her range on the left is 0 to 135. She has no medial or lateral joint line tenderness and no instability, left knee. Right knee: No effusion.  Slight valgus deformity. Range about 5 to 120. There is marked crepitus on range of motion of the right knee. She is tender lateral greater than medial. There is no instability noted about the knee. Pulse, sensation, and motor are intact.  X-RAYS: AP both knees and lateral show that she has essentially bone on bone arthritis in the lateral compartment in her right knee, with some patellofemoral involvement also. The left knee is unremarkable.  Assessment & Plan Osteoarthritis of right knee (715.96  M17.9)  Started OxyCODONE HCl 5MG , 1 - 2 Tablet every four hours, as needed fpr pain, #80, 11/14/2013, No Refill. Started Robaxin 500MG , 1 (one) Tablet every six hours, as needed for spasms, #80, 11/14/2013, No Refill. Started Xarelto 10MG , 1 (one) Tablet daily, #19, 11/14/2013, No Refill. Will take for two and a half weeks following total joint surgery.  Note:Plan is for a Right Total Knee Replacement by Dr. Wynelle Link.  Plan is to go home. The patient will likely need a hospital bed for home.  PCP - Dr. Dagmar Hait  The patient does not have any contraindications and will receive TXA (tranexamic acid) prior to surgery.  Please note that the patient was provided with three RXs preop in preparation for her upcoming surgery but she does understand that these may change depending upon how she responds to the medications postoperatively (Oxycodone for pain, Robaxin for spasm, Xarelto for DVT prophylaxsis).  Signed electronically by Joelene Millin, III PA-C

## 2013-11-16 ENCOUNTER — Other Ambulatory Visit: Payer: Self-pay | Admitting: Orthopedic Surgery

## 2013-11-16 NOTE — Progress Notes (Signed)
Preoperative surgical orders have been place into the [facility] hospital system for [name] on 11/16/2013, 1:16 PM  by Mickel Crow for surgery on 12/11/2013.  Preop Total Knee orders including Experal, IV Tylenol, and IV Decadron as long as there are no contraindications to the above medications. Arlee Muslim, PA-C

## 2013-11-24 ENCOUNTER — Encounter (HOSPITAL_COMMUNITY): Payer: Self-pay | Admitting: Pharmacy Technician

## 2013-11-29 ENCOUNTER — Inpatient Hospital Stay (HOSPITAL_COMMUNITY): Admission: RE | Admit: 2013-11-29 | Payer: BC Managed Care – PPO | Source: Ambulatory Visit

## 2013-11-29 NOTE — Patient Instructions (Addendum)
[name]  11/29/2013   Your procedure is scheduled on: 12/11/2013    Report to [facility].  Follow the Signs to [place] at   [place]     am  Call this number if you have problems the morning of surgery: [phone]   Remember:   Do not eat food or drink liquids after midnight.   Take these medicines the morning of surgery with A SIP OF WATER: dexilant, cymbalta, linzess, levothyroxine, systane eye drops    Do not wear jewelry, make-up or nail polish.  Do not wear lotions, powders, or perfumes.  deodorant.  Do not shave 48 hours prior to surgery.   Do not bring valuables to the hospital.  Contacts, dentures or bridgework may not be worn into surgery.  Leave suitcase in the car. After surgery it may be brought to your room.  For patients admitted to the hospital, checkout time is 11:00 AM the day of  discharge.   [facility] [facility] - Preparing for Surgery Before surgery, you can play an important role.  Because skin is not sterile, your skin needs to be as free of germs as possible.  You can reduce the number of germs on your skin by washing with CHG (chlorahexidine gluconate) soap before surgery.  CHG is an antiseptic cleaner which kills germs and bonds with the skin to continue killing germs even after washing. Please DO NOT use if you have an allergy to CHG or antibacterial soaps.  If your skin becomes reddened/irritated stop using the CHG and inform your nurse when you arrive at Short Stay. Do not shave (including legs and underarms) for at least 48 hours prior to the first CHG shower.  You may shave your face/neck. Please follow these instructions carefully:  1.  Shower with CHG Soap the night before surgery and the  morning of Surgery.  2.  If you choose to wash your hair, wash your hair first as usual with your  normal  shampoo.  3.  After you shampoo, rinse your hair and body thoroughly to remove the  shampoo.                           4.  Use CHG as you would any  other liquid soap.  You can apply chg directly  to the skin and wash                       Gently with a scrungie or clean washcloth.  5.  Apply the CHG Soap to your body ONLY FROM THE NECK DOWN.   Do not use on face/ open                           Wound or open sores. Avoid contact with eyes, ears mouth and genitals (private parts).                       Wash face,  Genitals (private parts) with your normal soap.             6.  Wash thoroughly, paying special attention to the area where your surgery  will be performed.  7.  Thoroughly rinse your body with warm water from the neck down.  8.  DO NOT shower/wash with your normal soap after using and rinsing off  the CHG Soap.  9.  Pat yourself dry with a clean towel.            10.  Wear clean pajamas.            11.  Place clean sheets on your bed the night of your first shower and do not  sleep with pets. Day of Surgery : Do not apply any lotions/deodorants the morning of surgery.  Please wear clean clothes to the hospital/surgery center.  FAILURE TO FOLLOW THESE INSTRUCTIONS MAY RESULT IN THE CANCELLATION OF YOUR SURGERY PATIENT SIGNATURE_________________________________  NURSE SIGNATURE__________________________________  ________________________________________________________________________  WHAT IS A BLOOD TRANSFUSION? Blood Transfusion Information  A transfusion is the replacement of blood or some of its parts. Blood is made up of multiple cells which provide different functions.  Red blood cells carry oxygen and are used for blood loss replacement.  White blood cells fight against infection.  Platelets control bleeding.  Plasma helps clot blood.  Other blood products are available for specialized needs, such as hemophilia or other clotting disorders. BEFORE THE TRANSFUSION  Who gives blood for transfusions?   Healthy volunteers who are fully evaluated to make sure their blood is safe. This is blood bank  blood. Transfusion therapy is the safest it has ever been in the practice of medicine. Before blood is taken from a donor, a complete history is taken to make sure that person has no history of diseases nor engages in risky social behavior (examples are intravenous drug use or sexual activity with multiple partners). The donor's travel history is screened to minimize risk of transmitting infections, such as malaria. The donated blood is tested for signs of infectious diseases, such as HIV and hepatitis. The blood is then tested to be sure it is compatible with you in order to minimize the chance of a transfusion reaction. If you or a relative donates blood, this is often done in anticipation of surgery and is not appropriate for emergency situations. It takes many days to process the donated blood. RISKS AND COMPLICATIONS Although transfusion therapy is very safe and saves many lives, the main dangers of transfusion include:   Getting an infectious disease.  Developing a transfusion reaction. This is an allergic reaction to something in the blood you were given. Every precaution is taken to prevent this. The decision to have a blood transfusion has been considered carefully by your caregiver before blood is given. Blood is not given unless the benefits outweigh the risks. AFTER THE TRANSFUSION  Right after receiving a blood transfusion, you will usually feel much better and more energetic. This is especially true if your red blood cells have gotten low (anemic). The transfusion raises the level of the red blood cells which carry oxygen, and this usually causes an energy increase.  The nurse administering the transfusion will monitor you carefully for complications. HOME CARE INSTRUCTIONS  No special instructions are needed after a transfusion. You may find your energy is better. Speak with your caregiver about any limitations on activity for underlying diseases you may have. SEEK MEDICAL CARE IF:    Your condition is not improving after your transfusion.  You develop redness or irritation at the intravenous (IV) site. SEEK IMMEDIATE MEDICAL CARE IF:  Any of the following symptoms occur over the next 12 hours:  Shaking chills.  You have a temperature by mouth above 102 F (38.9 C), not controlled by medicine.  Chest, back, or muscle pain.  People around you feel you are not acting correctly or  are confused.  Shortness of breath or difficulty breathing.  Dizziness and fainting.  You get a rash or develop hives.  You have a decrease in urine output.  Your urine turns a dark color or changes to pink, red, or brown. Any of the following symptoms occur over the next 10 days:  You have a temperature by mouth above 102 F (38.9 C), not controlled by medicine.  Shortness of breath.  Weakness after normal activity.  The white part of the eye turns yellow (jaundice).  You have a decrease in the amount of urine or are urinating less often.  Your urine turns a dark color or changes to pink, red, or brown. Document Released: 02/14/2000 Document Revised: 05/11/2011 Document Reviewed: 10/03/2007 ExitCare Patient Information 2014 [place].  _______________________________________________________________________  Incentive Spirometer  An incentive spirometer is a tool that can help keep your lungs clear and active. This tool measures how well you are filling your lungs with each breath. Taking long deep breaths may help reverse or decrease the chance of developing breathing (pulmonary) problems (especially infection) following:  A long period of time when you are unable to move or be active. BEFORE THE PROCEDURE   If the spirometer includes an indicator to show your best effort, your nurse or respiratory therapist will set it to a desired goal.  If possible, sit up straight or lean slightly forward. Try not to slouch.  Hold the incentive spirometer in an upright  position. INSTRUCTIONS FOR USE  1. Sit on the edge of your bed if possible, or sit up as far as you can in bed or on a chair. 2. Hold the incentive spirometer in an upright position. 3. Breathe out normally. 4. Place the mouthpiece in your mouth and seal your lips tightly around it. 5. Breathe in slowly and as deeply as possible, raising the piston or the ball toward the top of the column. 6. Hold your breath for 3-5 seconds or for as long as possible. Allow the piston or ball to fall to the bottom of the column. 7. Remove the mouthpiece from your mouth and breathe out normally. 8. Rest for a few seconds and repeat Steps 1 through 7 at least 10 times every 1-2 hours when you are awake. Take your time and take a few normal breaths between deep breaths. 9. The spirometer may include an indicator to show your best effort. Use the indicator as a goal to work toward during each repetition. 10. After each set of 10 deep breaths, practice coughing to be sure your lungs are clear. If you have an incision (the cut made at the time of surgery), support your incision when coughing by placing a pillow or rolled up towels firmly against it. Once you are able to get out of bed, walk around indoors and cough well. You may stop using the incentive spirometer when instructed by your caregiver.  RISKS AND COMPLICATIONS  Take your time so you do not get dizzy or light-headed.  If you are in pain, you may need to take or ask for pain medication before doing incentive spirometry. It is harder to take a deep breath if you are having pain. AFTER USE  Rest and breathe slowly and easily.  It can be helpful to keep track of a log of your progress. Your caregiver can provide you with a simple table to help with this. If you are using the spirometer at home, follow these instructions: [place] IF:  You are having difficultly using the spirometer.  You have trouble using the spirometer as often as  instructed.  Your pain medication is not giving enough relief while using the spirometer.  You develop fever of 100.5 F (38.1 C) or higher. SEEK IMMEDIATE MEDICAL CARE IF:   You cough up bloody sputum that had not been present before.  You develop fever of 102 F (38.9 C) or greater.  You develop worsening pain at or near the incision site. MAKE SURE YOU:   Understand these instructions.  Will watch your condition.  Will get help right away if you are not doing well or get worse. Document Released: 06/29/2006 Document Revised: 05/11/2011 Document Reviewed: 08/30/2006 ExitCare Patient Information 2014 ExitCare, [place].   ________________________________________________________________________    Please read over the following fact sheets that you were given: MRSA Information, coughing and deep breathing exercises, leg exercises

## 2013-11-30 ENCOUNTER — Encounter (HOSPITAL_COMMUNITY)
Admission: RE | Admit: 2013-11-30 | Discharge: 2013-11-30 | Disposition: A | Discharge status: Home / Self Care | Payer: Medicare Other | Source: Ambulatory Visit | Attending: Orthopedic Surgery | Admitting: Orthopedic Surgery

## 2013-11-30 ENCOUNTER — Encounter (HOSPITAL_COMMUNITY): Payer: Self-pay

## 2013-11-30 DIAGNOSIS — Z01812 Encounter for preprocedural laboratory examination: Secondary | ICD-10-CM | POA: Insufficient documentation

## 2013-11-30 DIAGNOSIS — M1711 Unilateral primary osteoarthritis, right knee: Secondary | ICD-10-CM | POA: Diagnosis not present

## 2013-11-30 DIAGNOSIS — F4323 Adjustment disorder with mixed anxiety and depressed mood: Secondary | ICD-10-CM | POA: Diagnosis not present

## 2013-11-30 HISTORY — DX: Anxiety disorder, unspecified: F41.9

## 2013-11-30 HISTORY — DX: Hypothyroidism, unspecified: E03.9

## 2013-11-30 HISTORY — DX: Cardiac murmur, unspecified: R01.1

## 2013-11-30 LAB — CBC
HCT: 44.5 % (ref 36.0–46.0)
Hemoglobin: 14.8 g/dL (ref 12.0–15.0)
MCH: 33.9 pg (ref 26.0–34.0)
MCHC: 33.3 g/dL (ref 30.0–36.0)
MCV: 101.8 fL — ABNORMAL HIGH (ref 78.0–100.0)
Platelets: 250 10*3/uL (ref 150–400)
RBC: 4.37 MIL/uL (ref 3.87–5.11)
RDW: 13.5 % (ref 11.5–15.5)
WBC: 5.1 10*3/uL (ref 4.0–10.5)

## 2013-11-30 LAB — URINALYSIS, ROUTINE W REFLEX MICROSCOPIC
Bilirubin Urine: NEGATIVE
Glucose, UA: NEGATIVE mg/dL
Hgb urine dipstick: NEGATIVE
Ketones, ur: NEGATIVE mg/dL
Leukocytes, UA: NEGATIVE
Nitrite: NEGATIVE
Protein, ur: NEGATIVE mg/dL
Specific Gravity, Urine: 1.004 — ABNORMAL LOW (ref 1.005–1.030)
Urobilinogen, UA: 0.2 mg/dL (ref 0.0–1.0)
pH: 7.5 (ref 5.0–8.0)

## 2013-11-30 LAB — COMPREHENSIVE METABOLIC PANEL
ALT: 26 U/L (ref 0–35)
AST: 26 U/L (ref 0–37)
Albumin: 4.2 g/dL (ref 3.5–5.2)
Alkaline Phosphatase: 70 U/L (ref 39–117)
Anion gap: 10 (ref 5–15)
BUN: 12 mg/dL (ref 6–23)
CO2: 25 mEq/L (ref 19–32)
Calcium: 8.8 mg/dL (ref 8.4–10.5)
Chloride: 100 mEq/L (ref 96–112)
Creatinine, Ser: 0.86 mg/dL (ref 0.50–1.10)
GFR calc Af Amer: 81 mL/min — ABNORMAL LOW (ref >90)
GFR calc non Af Amer: 70 mL/min — ABNORMAL LOW (ref >90)
Glucose, Bld: 84 mg/dL (ref 70–99)
Potassium: 3.8 mEq/L (ref 3.7–5.3)
Sodium: 135 mEq/L — ABNORMAL LOW (ref 137–147)
Total Bilirubin: 0.3 mg/dL (ref 0.3–1.2)
Total Protein: 8.4 g/dL — ABNORMAL HIGH (ref 6.0–8.3)

## 2013-11-30 LAB — PROTIME-INR
INR: 1 (ref 0.00–1.49)
Prothrombin Time: 13.3 seconds (ref 11.6–15.2)

## 2013-11-30 LAB — SURGICAL PCR SCREEN
MRSA, PCR: NEGATIVE
Staphylococcus aureus: NEGATIVE

## 2013-11-30 LAB — APTT: aPTT: 29 seconds (ref 24–37)

## 2013-11-30 NOTE — Progress Notes (Addendum)
ECHO 02/01/13 EPIC

## 2013-11-30 NOTE — Progress Notes (Signed)
Right heel with previous injury- Needs pillow.

## 2013-12-01 NOTE — Progress Notes (Signed)
Patient called back and clarified strength of lidocaine ointment that she uses as needed.  Placed under Home Medications.

## 2013-12-01 NOTE — Progress Notes (Signed)
Patient to call back of strength of Lidocaine ointment she uses so can be added to medication list. ,

## 2013-12-05 DIAGNOSIS — F4323 Adjustment disorder with mixed anxiety and depressed mood: Secondary | ICD-10-CM | POA: Diagnosis not present

## 2013-12-10 ENCOUNTER — Other Ambulatory Visit: Payer: Self-pay | Admitting: Orthopedic Surgery

## 2013-12-10 NOTE — H&P (Signed)
[name] DOB: 11-11-1950 Single / Language: [name] / Race: White Female Date Of Admission:  12-11-2013 Chief Complaint:  Right Knee Pain History of Present Illness  The patient is a 63 year old female who comes in  for a preoperative History and Physical. The patient is scheduled for a right total knee arthroplasty to be performed by Dr. Dione Plover. Aluisio, MD at [facility] on 12-11-2013. The patient reports right knee symptoms including: pain which began 9 month(s) or more ago following a specific injury (Patient states that she had surgery last June for a torn meniscus. She said that it "stepped funny" and caused this. She said that after her surgery her knee continued to hurt. She has had a cortisone injection. The pain is on both sides. She has been taking tramadol only as needed.).The patient feels that the symptoms are worsening. Symptoms are exacerbated by weight bearing. She states that the right knee is hurting her at most times now. Dr. Hilton Cork did an arthroscopy June 2014 for torn medial meniscus. Unfortunately, she has gotten worse since the arthroscopy. She feels as though the knee is now hurting at all times. The pain is medial and lateral. She had a second MRI performed in February of this year which showed a medial meniscal tear as well as severe degenerative change in the lateral compartment. The knee is hurting with most activities. She does have some pain at rest. It is starting to limit what she can and cannot do. She likes to walk and is unable to go on walks anymore because of the knee pain. She has had cortisone injection and had viscosupplement in the past. The injections have not helped. she is ready now to get the knee fixed. It is felt that due to the severe degenerative arthritic changes in the knee, the patient wold benefit from undergoing a total knee replacement. They have been treated conservatively in the past for the above stated problem and despite  conservative measures, they continue to have progressive pain and severe functional limitations and dysfunction. They have failed non-operative management including home exercise, medications, and injections. It is felt that they would benefit from undergoing total joint replacement. Risks and benefits of the procedure have been discussed with the patient and they elect to proceed with surgery. There are no active contraindications to surgery such as ongoing infection or rapidly progressive neurological disease.  Allergies  Sulfa Drugs Headache. NSAIDs GI upset Codeine Sulfate *ANALGESICS - OPIOID* Nausea - Able to take Hydrocodone and Oxycodone in the past.  Problem List/Past Medical  Primary osteoarthritis of one knee (715.16  M17.10) Osteoarthritis of right knee (715.96  M17.9) Skin Cancer Osteoarthritis Hypothyroidism Gastroesophageal Reflux Disease Hiatal Hernia Fibromyalgia Migraine Headache Irritable bowel syndrome Depression Autoimmune disorder Migraine Headache Tinnitus Osteoporosis Menopause  Family History Osteoarthritis Brother, Mother. Kidney disease Father. Hypertension Father. Rheumatoid Arthritis Mother. Osteoporosis Mother. Chronic Obstructive Lung Disease Mother. Cerebrovascular Accident Maternal Grandfather, Mother. Cancer Father. Heart Disease Father, Mother. Drug / Alcohol Addiction Father. Depression Father, Mother.  Social History Children 2 Exercise Exercises rarely; does other Current work status retired Current drinker 05/18/2013: Currently drinks wine 8-14 times per week No history of drug/alcohol rehab Marital status married Living situation live with spouse Tobacco use Never smoker. 05/18/2013 Number of flights of stairs before winded 2-3 Not under pain contract  Medication History Minocycline HCl (50MG  Capsule, Oral two times daily) Active. Cymbalta (60MG  Capsule DR Part, Oral)  Active. Abilify (2MG  Tablet, Oral) Active. Cyclobenzaprine  HCl (10MG  Tablet, Oral) Active. LORazepam (1MG  Tablet, Oral) Active. Estrace (0.1MG /GM Cream, Vaginal) Active. Dexilant (60MG  Capsule DR, Oral) Active. Linzess (290MCG Capsule, Oral) Active. Virgil (170MG  Capsule, Oral) Active. Valtrex (500MG  Tablet, Oral) Active. Topamax (15MG  Cap Sprinkle, Oral) Active. Levothyroxine Sodium (125MCG Tablet, Oral) Active. Vitamin D (Ergocalciferol) (50000UNIT Capsule, Oral) Active. Imitrex (Oral) Specific dose unknown - Active. Magnesium (Oral) Specific dose unknown - Active.   Past Surgical History  Hysterectomy Date: 24. complete (non-cancerous) Hemorrhoidectomy Gallbladder Surgery laporoscopic Tonsillectomy Date: 1952. Neck Disc Surgery Cervical Fusion Cataract Surgery Date: 2014. bilateral Arthroscopy of Knee Date: 2014. right Appendectomy Date: 83. Along with the Hysterectomy Foot Surgery Bunion Left Foot Dilation and Curettage of Uterus Colon Polyp Removal - Colonoscopy Right Elbow Tendon Repair   Review of Systems  General Not Present- Chills, Fatigue, Fever, Memory Loss, Night Sweats, Weight Gain and Weight Loss. Skin Not Present- Eczema, Hives, Itching, Lesions and Rash. HEENT Not Present- Dentures, Double Vision, Headache, Hearing Loss, Tinnitus and Visual Loss. Respiratory Not Present- Allergies, Chronic Cough, Coughing up blood, Shortness of breath at rest and Shortness of breath with exertion. Cardiovascular Not Present- Chest Pain, Difficulty Breathing Lying Down, Murmur, Palpitations, Racing/skipping heartbeats and Swelling. Gastrointestinal Not Present- Abdominal Pain, Bloody Stool, Constipation, Diarrhea, Difficulty Swallowing, Heartburn, Jaundice, Loss of appetitie, Nausea and Vomiting. Female Genitourinary Not Present- Blood in Urine, Discharge, Flank Pain, Incontinence, Painful Urination, Urgency, Urinary frequency,  Urinary Retention, Urinating at Night and Weak urinary stream. Musculoskeletal Not Present- Back Pain, Joint Pain, Joint Swelling, Morning Stiffness, Muscle Pain, Muscle Weakness and Spasms. Neurological Not Present- Blackout spells, Difficulty with balance, Dizziness, Paralysis, Tremor and Weakness. Psychiatric Not Present- Insomnia.   Vitals Weight: 132 lb Height: 65in Weight was reported by patient. Height was reported by patient. Body Surface Area: 1.66 m Body Mass Index: 21.97 kg/m BP: 114/70 (Sitting, Right Arm, Standard)    Physical Exam  General Mental Status -Alert, cooperative and good historian. General Appearance-pleasant, Not in acute distress. Orientation-Oriented X3. Build & Nutrition-Petite, Well nourished and Well developed.  Head and Neck Head-normocephalic, atraumatic . Neck Global Assessment - supple, no bruit auscultated on the right, no bruit auscultated on the left.  Eye Vision-Wears corrective lenses. Pupil - Bilateral-Regular and Round. Motion - Bilateral-EOMI.  Chest and Lung Exam Auscultation Breath sounds - clear at anterior chest wall and clear at posterior chest wall. Adventitious sounds - No Adventitious sounds.  Cardiovascular Auscultation Rhythm - Regular rate and rhythm. Heart Sounds - S1 WNL and S2 WNL. Murmurs & Other Heart Sounds - Auscultation of the heart reveals - No Murmurs.  Abdomen Palpation/Percussion Tenderness - Abdomen is non-tender to palpation. Rigidity (guarding) - Abdomen is soft. Auscultation Auscultation of the abdomen reveals - Bowel sounds normal.  Musculoskeletal Note: General: A very pleasant well developed female. Alert and oriented, in no apparent distress.  Musculoskeletal: Both hips show normal range of motion with no discomfort. Extremities: Her left knee shows no effusion. Her range on the left is 0 to 135. She has no medial or lateral joint line tenderness and no instability,  left knee. Right knee: No effusion. Slight valgus deformity. Range about 5 to 120. There is marked crepitus on range of motion of the right knee. She is tender lateral greater than medial. There is no instability noted about the knee. Pulse, sensation, and motor are intact.  X-RAYS: AP both knees and lateral show that she has essentially bone on bone arthritis in the lateral compartment in her right  knee, with some patellofemoral involvement also. The left knee is unremarkable.   Assessment & Plan Osteoarthritis of right knee (715.96  M17.9) Current Plans  Started OxyCODONE HCl 5MG , 1 - 2 Tablet every four hours, as needed fpr pain, #80, 11/14/2013, No Refill. Started Robaxin 500MG , 1 (one) Tablet every six hours, as needed for spasms, #80, 11/14/2013, No Refill. Started Xarelto 10MG , 1 (one) Tablet daily, #19, 11/14/2013, No Refill. Local Order: Will take for two and a half weeks following total joint surgery.  Note:Plan is for a Right Total Knee Replacement by Dr. Wynelle Link.  Plan is to go home. The patient will likely need a hospital bed for home.  PCP - Dr. Dagmar Hait  The patient does not have any contraindications and will receive TXA (tranexamic acid) prior to surgery.  Please note that the patient was provided with three RXs preop in preparation for her upcoming surgery but she does understand that these may change depending upon how she responds to the medications postoperatively (Oxycodone for pain, Robaxin for spasm, Xarelto for DVT prophylaxsis).  Signed electronically by Joelene Millin, III PA-C

## 2013-12-11 ENCOUNTER — Encounter (HOSPITAL_COMMUNITY): Payer: Self-pay

## 2013-12-11 ENCOUNTER — Encounter (HOSPITAL_COMMUNITY): Payer: Medicare Other | Admitting: Anesthesiology

## 2013-12-11 ENCOUNTER — Inpatient Hospital Stay (HOSPITAL_COMMUNITY)
Admission: RE | Admit: 2013-12-11 | Discharge: 2013-12-13 | DRG: 470 | Disposition: A | Discharge status: Home Health | Payer: Medicare Other | Source: Ambulatory Visit | Attending: Orthopedic Surgery | Admitting: Orthopedic Surgery

## 2013-12-11 ENCOUNTER — Encounter (HOSPITAL_COMMUNITY)
Admission: RE | Disposition: A | Discharge status: Home Health | Payer: Self-pay | Source: Ambulatory Visit | Attending: Orthopedic Surgery

## 2013-12-11 ENCOUNTER — Inpatient Hospital Stay (HOSPITAL_COMMUNITY): Payer: Medicare Other | Admitting: Anesthesiology

## 2013-12-11 DIAGNOSIS — Z882 Allergy status to sulfonamides status: Secondary | ICD-10-CM

## 2013-12-11 DIAGNOSIS — M1711 Unilateral primary osteoarthritis, right knee: Secondary | ICD-10-CM | POA: Diagnosis not present

## 2013-12-11 DIAGNOSIS — M179 Osteoarthritis of knee, unspecified: Principal | ICD-10-CM | POA: Diagnosis present

## 2013-12-11 DIAGNOSIS — Z809 Family history of malignant neoplasm, unspecified: Secondary | ICD-10-CM

## 2013-12-11 DIAGNOSIS — Z823 Family history of stroke: Secondary | ICD-10-CM

## 2013-12-11 DIAGNOSIS — K589 Irritable bowel syndrome without diarrhea: Secondary | ICD-10-CM | POA: Diagnosis present

## 2013-12-11 DIAGNOSIS — Z8249 Family history of ischemic heart disease and other diseases of the circulatory system: Secondary | ICD-10-CM

## 2013-12-11 DIAGNOSIS — Z885 Allergy status to narcotic agent status: Secondary | ICD-10-CM

## 2013-12-11 DIAGNOSIS — Z01812 Encounter for preprocedural laboratory examination: Secondary | ICD-10-CM

## 2013-12-11 DIAGNOSIS — Z86010 Personal history of colonic polyps: Secondary | ICD-10-CM

## 2013-12-11 DIAGNOSIS — F329 Major depressive disorder, single episode, unspecified: Secondary | ICD-10-CM | POA: Diagnosis present

## 2013-12-11 DIAGNOSIS — M797 Fibromyalgia: Secondary | ICD-10-CM | POA: Diagnosis present

## 2013-12-11 DIAGNOSIS — Z79899 Other long term (current) drug therapy: Secondary | ICD-10-CM

## 2013-12-11 DIAGNOSIS — M8580 Other specified disorders of bone density and structure, unspecified site: Secondary | ICD-10-CM | POA: Diagnosis present

## 2013-12-11 DIAGNOSIS — E039 Hypothyroidism, unspecified: Secondary | ICD-10-CM | POA: Diagnosis present

## 2013-12-11 DIAGNOSIS — M25561 Pain in right knee: Secondary | ICD-10-CM | POA: Diagnosis not present

## 2013-12-11 DIAGNOSIS — Z8261 Family history of arthritis: Secondary | ICD-10-CM | POA: Diagnosis not present

## 2013-12-11 DIAGNOSIS — M25761 Osteophyte, right knee: Secondary | ICD-10-CM | POA: Diagnosis present

## 2013-12-11 DIAGNOSIS — K219 Gastro-esophageal reflux disease without esophagitis: Secondary | ICD-10-CM | POA: Diagnosis not present

## 2013-12-11 DIAGNOSIS — Z825 Family history of asthma and other chronic lower respiratory diseases: Secondary | ICD-10-CM

## 2013-12-11 DIAGNOSIS — Z888 Allergy status to other drugs, medicaments and biological substances status: Secondary | ICD-10-CM

## 2013-12-11 DIAGNOSIS — Z6822 Body mass index (BMI) 22.0-22.9, adult: Secondary | ICD-10-CM | POA: Diagnosis not present

## 2013-12-11 DIAGNOSIS — M1710 Unilateral primary osteoarthritis, unspecified knee: Secondary | ICD-10-CM | POA: Diagnosis present

## 2013-12-11 HISTORY — PX: TOTAL KNEE ARTHROPLASTY: SHX125

## 2013-12-11 LAB — TYPE AND SCREEN
ABO/RH(D): O POS
Antibody Screen: NEGATIVE

## 2013-12-11 LAB — ABO/RH: ABO/RH(D): O POS

## 2013-12-11 SURGERY — ARTHROPLASTY, KNEE, TOTAL
Anesthesia: General | Site: Knee | Laterality: Right

## 2013-12-11 MED ORDER — CEFAZOLIN SODIUM-DEXTROSE 2-3 GM-% IV SOLR
INTRAVENOUS | Status: AC
  Filled 2013-12-11: qty 50

## 2013-12-11 MED ORDER — MENTHOL 3 MG MT LOZG
1.0000 | LOZENGE | OROMUCOSAL | Status: DC | PRN
  Filled 2013-12-11: qty 9

## 2013-12-11 MED ORDER — BUPIVACAINE LIPOSOME 1.3 % IJ SUSP
INTRAMUSCULAR | Status: DC | PRN
  Administered 2013-12-11: 20 mL

## 2013-12-11 MED ORDER — METHOCARBAMOL 500 MG PO TABS
500.0000 mg | ORAL_TABLET | Freq: Four times a day (QID) | ORAL | Status: DC | PRN
  Administered 2013-12-11 – 2013-12-12 (×3): 500 mg via ORAL
  Filled 2013-12-11 (×3): qty 1

## 2013-12-11 MED ORDER — FENTANYL CITRATE 0.05 MG/ML IJ SOLN
25.0000 ug | INTRAMUSCULAR | Status: DC | PRN
  Administered 2013-12-11 (×2): 50 ug via INTRAVENOUS

## 2013-12-11 MED ORDER — MIDAZOLAM HCL 5 MG/5ML IJ SOLN
INTRAMUSCULAR | Status: DC | PRN
  Administered 2013-12-11: 2 mg via INTRAVENOUS

## 2013-12-11 MED ORDER — KCL IN DEXTROSE-NACL 20-5-0.9 MEQ/L-%-% IV SOLN
INTRAVENOUS | Status: AC
  Filled 2013-12-11: qty 1000

## 2013-12-11 MED ORDER — FLEET ENEMA 7-19 GM/118ML RE ENEM: 1.0000 | ENEMA | Freq: Once | RECTAL | Status: AC | PRN

## 2013-12-11 MED ORDER — CEFAZOLIN SODIUM-DEXTROSE 2-3 GM-% IV SOLR
2.0000 g | INTRAVENOUS | Status: AC
  Administered 2013-12-11: 2 g via INTRAVENOUS

## 2013-12-11 MED ORDER — SODIUM CHLORIDE 0.9 % IJ SOLN
INTRAMUSCULAR | Status: DC | PRN
  Administered 2013-12-11: 30 mL

## 2013-12-11 MED ORDER — BUPIVACAINE LIPOSOME 1.3 % IJ SUSP
20.0000 mL | Freq: Once | INTRAMUSCULAR | Status: DC
  Filled 2013-12-11: qty 20

## 2013-12-11 MED ORDER — METOCLOPRAMIDE HCL 10 MG PO TABS: 5.0000 mg | ORAL_TABLET | Freq: Three times a day (TID) | ORAL | Status: DC | PRN

## 2013-12-11 MED ORDER — ACETAMINOPHEN 10 MG/ML IV SOLN
1000.0000 mg | Freq: Once | INTRAVENOUS | Status: AC
  Administered 2013-12-11: 1000 mg via INTRAVENOUS
  Filled 2013-12-11: qty 100

## 2013-12-11 MED ORDER — BISACODYL 10 MG RE SUPP: 10.0000 mg | Freq: Every day | RECTAL | Status: DC | PRN

## 2013-12-11 MED ORDER — PROPOFOL 10 MG/ML IV BOLUS
INTRAVENOUS | Status: DC | PRN
  Administered 2013-12-11: 30 mg via INTRAVENOUS

## 2013-12-11 MED ORDER — CEFAZOLIN SODIUM-DEXTROSE 2-3 GM-% IV SOLR
2.0000 g | Freq: Four times a day (QID) | INTRAVENOUS | Status: AC
  Administered 2013-12-11 (×2): 2 g via INTRAVENOUS
  Filled 2013-12-11 (×2): qty 50

## 2013-12-11 MED ORDER — PHENYLEPHRINE HCL 10 MG/ML IJ SOLN
INTRAMUSCULAR | Status: AC
  Filled 2013-12-11: qty 1

## 2013-12-11 MED ORDER — BUPIVACAINE HCL 0.25 % IJ SOLN
INTRAMUSCULAR | Status: DC | PRN
  Administered 2013-12-11: 20 mL

## 2013-12-11 MED ORDER — FENTANYL CITRATE 0.05 MG/ML IJ SOLN
INTRAMUSCULAR | Status: DC | PRN
  Administered 2013-12-11: 50 ug via INTRAVENOUS

## 2013-12-11 MED ORDER — TRANEXAMIC ACID 100 MG/ML IV SOLN
1000.0000 mg | INTRAVENOUS | Status: AC
  Administered 2013-12-11: 1000 mg via INTRAVENOUS
  Filled 2013-12-11: qty 10

## 2013-12-11 MED ORDER — ONDANSETRON HCL 4 MG PO TABS
4.0000 mg | ORAL_TABLET | Freq: Four times a day (QID) | ORAL | Status: DC | PRN
Start: 2013-12-11 — End: 2013-12-13

## 2013-12-11 MED ORDER — BUPIVACAINE IN DEXTROSE 0.75-8.25 % IT SOLN
INTRATHECAL | Status: DC | PRN
  Administered 2013-12-11: 1.8 mL via INTRATHECAL

## 2013-12-11 MED ORDER — MIDAZOLAM HCL 2 MG/2ML IJ SOLN
INTRAMUSCULAR | Status: AC
  Filled 2013-12-11: qty 2

## 2013-12-11 MED ORDER — SODIUM CHLORIDE 0.9 % IJ SOLN
INTRAMUSCULAR | Status: AC
  Filled 2013-12-11: qty 50

## 2013-12-11 MED ORDER — METOCLOPRAMIDE HCL 5 MG/ML IJ SOLN: 5.0000 mg | Freq: Three times a day (TID) | INTRAMUSCULAR | Status: DC | PRN

## 2013-12-11 MED ORDER — LEVOTHYROXINE SODIUM 125 MCG PO TABS
125.0000 ug | ORAL_TABLET | Freq: Every day | ORAL | Status: DC
  Administered 2013-12-12 – 2013-12-13 (×2): 125 ug via ORAL
  Filled 2013-12-11 (×3): qty 1

## 2013-12-11 MED ORDER — VALACYCLOVIR HCL 500 MG PO TABS
500.0000 mg | ORAL_TABLET | Freq: Every day | ORAL | Status: DC
  Administered 2013-12-12 – 2013-12-13 (×2): 500 mg via ORAL
  Filled 2013-12-11 (×2): qty 1

## 2013-12-11 MED ORDER — FENTANYL CITRATE 0.05 MG/ML IJ SOLN
INTRAMUSCULAR | Status: AC
  Filled 2013-12-11: qty 2

## 2013-12-11 MED ORDER — ACETAMINOPHEN 500 MG PO TABS
1000.0000 mg | ORAL_TABLET | Freq: Four times a day (QID) | ORAL | Status: AC
  Administered 2013-12-11 – 2013-12-12 (×4): 1000 mg via ORAL
  Filled 2013-12-11 (×4): qty 2

## 2013-12-11 MED ORDER — SODIUM CHLORIDE 0.9 % IV SOLN: INTRAVENOUS | Status: DC

## 2013-12-11 MED ORDER — ARIPIPRAZOLE 5 MG PO TABS
2.5000 mg | ORAL_TABLET | Freq: Every day | ORAL | Status: DC
  Administered 2013-12-11 – 2013-12-12 (×2): 2.5 mg via ORAL
  Filled 2013-12-11 (×3): qty 1

## 2013-12-11 MED ORDER — POLYETHYLENE GLYCOL 3350 17 G PO PACK
17.0000 g | PACK | Freq: Every day | ORAL | Status: DC | PRN
  Administered 2013-12-12: 17 g via ORAL

## 2013-12-11 MED ORDER — TOPIRAMATE 25 MG PO TABS
150.0000 mg | ORAL_TABLET | Freq: Every day | ORAL | Status: DC
  Administered 2013-12-11 – 2013-12-12 (×2): 150 mg via ORAL
  Filled 2013-12-11 (×3): qty 2

## 2013-12-11 MED ORDER — LACTATED RINGERS IV SOLN
INTRAVENOUS | Status: DC | PRN
  Administered 2013-12-11 (×2): via INTRAVENOUS

## 2013-12-11 MED ORDER — BUPIVACAINE HCL (PF) 0.25 % IJ SOLN
INTRAMUSCULAR | Status: AC
  Filled 2013-12-11: qty 30

## 2013-12-11 MED ORDER — METHOCARBAMOL 1000 MG/10ML IJ SOLN
500.0000 mg | Freq: Four times a day (QID) | INTRAVENOUS | Status: DC | PRN
  Administered 2013-12-11: 500 mg via INTRAVENOUS
  Filled 2013-12-11: qty 5

## 2013-12-11 MED ORDER — PROPOFOL 10 MG/ML IV BOLUS
INTRAVENOUS | Status: AC
  Filled 2013-12-11: qty 20

## 2013-12-11 MED ORDER — PROMETHAZINE HCL 25 MG/ML IJ SOLN: 6.2500 mg | INTRAMUSCULAR | Status: DC | PRN

## 2013-12-11 MED ORDER — DOCUSATE SODIUM 100 MG PO CAPS
100.0000 mg | ORAL_CAPSULE | Freq: Two times a day (BID) | ORAL | Status: DC
  Administered 2013-12-11 – 2013-12-13 (×4): 100 mg via ORAL

## 2013-12-11 MED ORDER — LACTATED RINGERS IV SOLN: INTRAVENOUS | Status: DC

## 2013-12-11 MED ORDER — SUMATRIPTAN SUCCINATE 6 MG/0.5ML ~~LOC~~ SOLN
6.0000 mg | SUBCUTANEOUS | Status: DC | PRN
  Filled 2013-12-11: qty 0.5

## 2013-12-11 MED ORDER — DIPHENHYDRAMINE HCL 12.5 MG/5ML PO ELIX: 12.5000 mg | ORAL_SOLUTION | ORAL | Status: DC | PRN

## 2013-12-11 MED ORDER — CHLORHEXIDINE GLUCONATE 4 % EX LIQD: 60.0000 mL | Freq: Once | CUTANEOUS | Status: DC

## 2013-12-11 MED ORDER — PANTOPRAZOLE SODIUM 40 MG PO TBEC
80.0000 mg | DELAYED_RELEASE_TABLET | Freq: Every morning | ORAL | Status: DC
  Filled 2013-12-11: qty 2

## 2013-12-11 MED ORDER — PHENOL 1.4 % MT LIQD: 1.0000 | OROMUCOSAL | Status: DC | PRN

## 2013-12-11 MED ORDER — PHENYLEPHRINE HCL 10 MG/ML IJ SOLN
10.0000 mg | INTRAVENOUS | Status: DC | PRN
  Administered 2013-12-11: 15 ug/min via INTRAVENOUS

## 2013-12-11 MED ORDER — DEXAMETHASONE SODIUM PHOSPHATE 10 MG/ML IJ SOLN
10.0000 mg | Freq: Once | INTRAMUSCULAR | Status: AC
  Administered 2013-12-12: 10 mg via INTRAVENOUS
  Filled 2013-12-11: qty 1

## 2013-12-11 MED ORDER — DULOXETINE HCL 60 MG PO CPEP
60.0000 mg | ORAL_CAPSULE | Freq: Every day | ORAL | Status: DC
  Administered 2013-12-12 – 2013-12-13 (×2): 60 mg via ORAL
  Filled 2013-12-11 (×2): qty 1

## 2013-12-11 MED ORDER — DEXAMETHASONE SODIUM PHOSPHATE 10 MG/ML IJ SOLN
10.0000 mg | Freq: Once | INTRAMUSCULAR | Status: AC
  Administered 2013-12-11: 10 mg via INTRAVENOUS

## 2013-12-11 MED ORDER — MORPHINE SULFATE 2 MG/ML IJ SOLN
1.0000 mg | INTRAMUSCULAR | Status: DC | PRN
Start: 2013-12-11 — End: 2013-12-13
  Administered 2013-12-11 (×4): 2 mg via INTRAVENOUS
  Administered 2013-12-11: 1 mg via INTRAVENOUS
  Administered 2013-12-12 (×2): 2 mg via INTRAVENOUS
  Filled 2013-12-11 (×7): qty 1

## 2013-12-11 MED ORDER — KCL IN DEXTROSE-NACL 20-5-0.9 MEQ/L-%-% IV SOLN
INTRAVENOUS | Status: DC
  Administered 2013-12-11 – 2013-12-12 (×2): via INTRAVENOUS
  Filled 2013-12-11 (×3): qty 1000

## 2013-12-11 MED ORDER — RIVAROXABAN 10 MG PO TABS
10.0000 mg | ORAL_TABLET | Freq: Every day | ORAL | Status: DC
  Administered 2013-12-12 – 2013-12-13 (×2): 10 mg via ORAL
  Filled 2013-12-11 (×3): qty 1

## 2013-12-11 MED ORDER — PROPOFOL INFUSION 10 MG/ML OPTIME
INTRAVENOUS | Status: DC | PRN
  Administered 2013-12-11: 180 ug/kg/min via INTRAVENOUS

## 2013-12-11 MED ORDER — ONDANSETRON HCL 4 MG/2ML IJ SOLN
4.0000 mg | Freq: Four times a day (QID) | INTRAMUSCULAR | Status: DC | PRN
  Administered 2013-12-11 (×2): 4 mg via INTRAVENOUS
  Filled 2013-12-11 (×2): qty 2

## 2013-12-11 MED ORDER — LORAZEPAM 1 MG PO TABS
2.0000 mg | ORAL_TABLET | Freq: Every day | ORAL | Status: DC
  Administered 2013-12-11 – 2013-12-12 (×2): 2 mg via ORAL
  Filled 2013-12-11 (×3): qty 2

## 2013-12-11 MED ORDER — ACETAMINOPHEN 325 MG PO TABS
650.0000 mg | ORAL_TABLET | Freq: Four times a day (QID) | ORAL | Status: DC | PRN
Start: 2013-12-12 — End: 2013-12-13

## 2013-12-11 MED ORDER — ACETAMINOPHEN 650 MG RE SUPP: 650.0000 mg | Freq: Four times a day (QID) | RECTAL | Status: DC | PRN

## 2013-12-11 MED ORDER — OXYCODONE HCL 5 MG PO TABS
5.0000 mg | ORAL_TABLET | ORAL | Status: DC | PRN
Start: 2013-12-11 — End: 2013-12-12
  Administered 2013-12-11: 5 mg via ORAL
  Administered 2013-12-11 – 2013-12-12 (×5): 10 mg via ORAL
  Filled 2013-12-11 (×5): qty 2
  Filled 2013-12-11: qty 1

## 2013-12-11 SURGICAL SUPPLY — 58 items
BAG ZIPLOCK 12X15 (MISCELLANEOUS) ×2 IMPLANT
BANDAGE ELASTIC 6 VELCRO ST LF (GAUZE/BANDAGES/DRESSINGS) ×2 IMPLANT
BANDAGE ESMARK 6X9 LF (GAUZE/BANDAGES/DRESSINGS) ×1 IMPLANT
BLADE SAG 18X100X1.27 (BLADE) ×2 IMPLANT
BLADE SAW SGTL 11.0X1.19X90.0M (BLADE) ×2 IMPLANT
BNDG ESMARK 6X9 LF (GAUZE/BANDAGES/DRESSINGS) ×2
BOWL SMART MIX CTS (DISPOSABLE) ×2 IMPLANT
CAP KNEE ATTUNE RP ×2 IMPLANT
CEMENT HV SMART SET (Cement) ×4 IMPLANT
CUFF TOURN SGL QUICK 34 (TOURNIQUET CUFF) ×1
CUFF TRNQT CYL 34X4X40X1 (TOURNIQUET CUFF) ×1 IMPLANT
DECANTER SPIKE VIAL GLASS SM (MISCELLANEOUS) ×2 IMPLANT
DRAPE EXTREMITY TIBURON (DRAPES) ×2 IMPLANT
DRAPE POUCH INSTRU U-SHP 10X18 (DRAPES) ×2 IMPLANT
DRAPE U-SHAPE 47X51 STRL (DRAPES) ×2 IMPLANT
DRSG ADAPTIC 3X8 NADH LF (GAUZE/BANDAGES/DRESSINGS) ×2 IMPLANT
DRSG PAD ABDOMINAL 8X10 ST (GAUZE/BANDAGES/DRESSINGS) IMPLANT
DURAPREP 26ML APPLICATOR (WOUND CARE) ×2 IMPLANT
ELECT REM PT RETURN 9FT ADLT (ELECTROSURGICAL) ×2
ELECTRODE REM PT RTRN 9FT ADLT (ELECTROSURGICAL) ×1 IMPLANT
EVACUATOR 1/8 PVC DRAIN (DRAIN) ×2 IMPLANT
FACESHIELD WRAPAROUND (MASK) ×10 IMPLANT
GAUZE SPONGE 4X4 12PLY STRL (GAUZE/BANDAGES/DRESSINGS) ×2 IMPLANT
GLOVE BIO SURGEON STRL SZ7.5 (GLOVE) IMPLANT
GLOVE BIO SURGEON STRL SZ8 (GLOVE) ×2 IMPLANT
GLOVE BIOGEL PI IND STRL 6.5 (GLOVE) IMPLANT
GLOVE BIOGEL PI IND STRL 8 (GLOVE) ×1 IMPLANT
GLOVE BIOGEL PI INDICATOR 6.5 (GLOVE)
GLOVE BIOGEL PI INDICATOR 8 (GLOVE) ×1
GLOVE SURG SS PI 6.5 STRL IVOR (GLOVE) IMPLANT
GOWN STRL REUS W/TWL LRG LVL3 (GOWN DISPOSABLE) ×2 IMPLANT
GOWN STRL REUS W/TWL XL LVL3 (GOWN DISPOSABLE) IMPLANT
HANDPIECE INTERPULSE COAX TIP (DISPOSABLE) ×1
IMMOBILIZER KNEE 20 (SOFTGOODS) ×2 IMPLANT
IMMOBILIZER KNEE 20 THIGH 36 (SOFTGOODS) IMPLANT
KIT BASIN OR (CUSTOM PROCEDURE TRAY) ×2 IMPLANT
MANIFOLD NEPTUNE II (INSTRUMENTS) ×2 IMPLANT
NDL SAFETY ECLIPSE 18X1.5 (NEEDLE) ×2 IMPLANT
NEEDLE HYPO 18GX1.5 SHARP (NEEDLE) ×2
NS IRRIG 1000ML POUR BTL (IV SOLUTION) ×2 IMPLANT
PACK TOTAL JOINT (CUSTOM PROCEDURE TRAY) ×2 IMPLANT
PAD ABD 8X10 STRL (GAUZE/BANDAGES/DRESSINGS) ×2 IMPLANT
PADDING CAST COTTON 6X4 STRL (CAST SUPPLIES) ×2 IMPLANT
POSITIONER SURGICAL ARM (MISCELLANEOUS) ×2 IMPLANT
SET HNDPC FAN SPRY TIP SCT (DISPOSABLE) ×1 IMPLANT
STRIP CLOSURE SKIN 1/2X4 (GAUZE/BANDAGES/DRESSINGS) ×2 IMPLANT
SUCTION FRAZIER 12FR DISP (SUCTIONS) ×2 IMPLANT
SUT MNCRL AB 4-0 PS2 18 (SUTURE) ×2 IMPLANT
SUT VIC AB 2-0 CT1 27 (SUTURE) ×3
SUT VIC AB 2-0 CT1 TAPERPNT 27 (SUTURE) ×3 IMPLANT
SUT VLOC 180 0 24IN GS25 (SUTURE) ×2 IMPLANT
SYR 20CC LL (SYRINGE) ×2 IMPLANT
SYR 50ML LL SCALE MARK (SYRINGE) ×2 IMPLANT
TOWEL OR 17X26 10 PK STRL BLUE (TOWEL DISPOSABLE) ×2 IMPLANT
TOWEL OR NON WOVEN STRL DISP B (DISPOSABLE) IMPLANT
TRAY FOLEY CATH 14FRSI W/METER (CATHETERS) ×2 IMPLANT
WATER STERILE IRR 1500ML POUR (IV SOLUTION) ×2 IMPLANT
WRAP KNEE MAXI GEL POST OP (GAUZE/BANDAGES/DRESSINGS) ×2 IMPLANT

## 2013-12-11 NOTE — Op Note (Signed)
Pre-operative diagnosis- Osteoarthritis  Right knee(s)  Post-operative diagnosis- Osteoarthritis Right knee(s)  Procedure-  Right  Total Knee Arthroplasty (Depuy Attune)  Surgeon- Dione Plover. Luretta Everly, MD  Assistant- Ardeen Jourdain, PA-C   Anesthesia-  Spinal  EBL-* No blood loss amount entered *   Drains Hemovac  Tourniquet time- 32 minutes @300  mm Hg  Complications- None  Condition-PACU - hemodynamically stable.   Brief Clinical Note  [name] is a 63 y.o. year old female with end stage OA of her right knee with progressively worsening pain and dysfunction. She has constant pain, with activity and at rest and significant functional deficits with difficulties even with ADLs. She has had extensive non-op management including analgesics, injections of cortisone and viscosupplements, and home exercise program, but remains in significant pain with significant dysfunction.Radiographs show bone on bone arthritis lateral and patellofemoral. She presents now for right Total Knee Arthroplasty.    Procedure in detail---   The patient is brought into the operating room and positioned supine on the operating table. After successful administration of  Spinal,   a tourniquet is placed high on the  Right thigh(s) and the lower extremity is prepped and draped in the usual sterile fashion. Time out is performed by the operating team and then the  Right lower extremity is wrapped in Esmarch, knee flexed and the tourniquet inflated to 300 mmHg.       A midline incision is made with a ten blade through the subcutaneous tissue to the level of the extensor mechanism. A fresh blade is used to make a medial parapatellar arthrotomy. Soft tissue over the proximal medial tibia is subperiosteally elevated to the joint line with a knife and into the semimembranosus bursa with a Cobb elevator. Soft tissue over the proximal lateral tibia is elevated with attention being paid to avoiding the patellar tendon on the  tibial tubercle. The patella is everted, knee flexed 90 degrees and the ACL and PCL are removed. Findings are exposed bone lateral tibial plateau and femur with exposed patellofemoral bone.        The drill is used to create a starting hole in the distal femur and the canal is thoroughly irrigated with sterile saline to remove the fatty contents. The 5 degree Right  valgus alignment guide is placed into the femoral canal and the distal femoral cutting block is pinned to remove 9 mm off the distal femur. Resection is made with an oscillating saw.      The tibia is subluxed forward and the menisci are removed. The extramedullary alignment guide is placed referencing proximally at the medial aspect of the tibial tubercle and distally along the second metatarsal axis and tibial crest. The block is pinned to remove 86mm off the more deficient lateral  side. Resection is made with an oscillating saw. Size 5is the most appropriate size for the tibia and the proximal tibia is prepared with the modular drill and keel punch for that size.      The femoral sizing guide is placed and size 5 is most appropriate. Rotation is marked off the epicondylar axis and confirmed by creating a rectangular flexion gap at 90 degrees. The size 5 cutting block is pinned in this rotation and the anterior, posterior and chamfer cuts are made with the oscillating saw. The intercondylar block is then placed and that cut is made.      Trial size 5 tibial component, trial size 5 posterior stabilized femur and a 6  mm posterior  stabilized rotating platform insert trial is placed. Full extension is achieved with excellent varus/valgus and anterior/posterior balance throughout full range of motion. The patella is everted and thickness measured to be 22  mm. Free hand resection is taken to 12 mm, a 35 template is placed, lug holes are drilled, trial patella is placed, and it tracks normally. Osteophytes are removed off the posterior femur with the  trial in place. All trials are removed and the cut bone surfaces prepared with pulsatile lavage. Cement is mixed and once ready for implantation, the size 5 tibial implant, size  5 posterior stabilized femoral component, and the size 35 patella are cemented in place and the patella is held with the clamp. The trial insert is placed and the knee held in full extension. The Exparel (20 ml mixed with 30 ml saline) and .25% Bupivicaine, are injected into the extensor mechanism, posterior capsule, medial and lateral gutters and subcutaneous tissues.  All extruded cement is removed and once the cement is hard the permanent 6 mm posterior stabilized rotating platform insert is placed into the tibial tray.      The wound is copiously irrigated with saline solution and the extensor mechanism closed over a hemovac drain with #1 V-loc suture. The tourniquet is released for a total tourniquet time of 32  minutes. Flexion against gravity is 140 degrees and the patella tracks normally. Subcutaneous tissue is closed with 2.0 vicryl and subcuticular with running 4.0 Monocryl. The incision is cleaned and dried and steri-strips and a bulky sterile dressing are applied. The limb is placed into a knee immobilizer and the patient is awakened and transported to recovery in stable condition.      Please note that a surgical assistant was a medical necessity for this procedure in order to perform it in a safe and expeditious manner. Surgical assistant was necessary to retract the ligaments and vital neurovascular structures to prevent injury to them and also necessary for proper positioning of the limb to allow for anatomic placement of the prosthesis.   Dione Plover Fleming Prill, MD    12/11/2013, 8:07 AM

## 2013-12-11 NOTE — Transfer of Care (Signed)
Immediate Anesthesia Transfer of Care Note  Patient: [name]  Procedure(s) Performed: Procedure(s): RIGHT TOTAL KNEE ARTHROPLASTY (Right)  Patient Location: PACU  Anesthesia Type:Spinal  Level of Consciousness: awake, alert , oriented and patient cooperative  Airway & Oxygen Therapy: Patient Spontanous Breathing and Patient connected to face mask oxygen  Post-op Assessment: Report given to PACU RN and Post -op Vital signs reviewed and stable  Post vital signs: Reviewed and stable  Complications: No apparent anesthesia complications

## 2013-12-11 NOTE — Evaluation (Signed)
Physical Therapy Evaluation Patient Details Name: [name] MRN: [id number] DOB: [birth year] Today's Date: 12/11/2013   History of Present Illness  Pt is a 63 year old female s/p R TKA.  Clinical Impression  Pt is s/p R TKA resulting in the deficits listed below (see PT Problem List).  Pt will benefit from skilled PT to increase their independence and safety with mobility to allow discharge home with spouse.  Pt ambulated short distance in hallway and to bathroom POD #0.  Pt plans to d/c home with spouse assist.     Follow Up Recommendations Home health PT    Equipment Recommendations  None recommended by PT    Recommendations for Other Services       Precautions / Restrictions Precautions Precautions: Knee Required Braces or Orthoses: Knee Immobilizer - Right Knee Immobilizer - Right: Discontinue once straight leg raise with < 10 degree lag Restrictions RLE Weight Bearing: Weight bearing as tolerated      Mobility  Bed Mobility Overal bed mobility: Needs Assistance Bed Mobility: Supine to Sit     Supine to sit: Min assist     General bed mobility comments: assist for R LE  Transfers Overall transfer level: Needs assistance Equipment used: Rolling walker (2 wheeled) Transfers: Sit to/from Stand Sit to Stand: Min assist         General transfer comment: assist for R LE positioning, verbal cues for hand placement  Ambulation/Gait Ambulation/Gait assistance: Min assist Ambulation Distance (Feet): 20 Feet Assistive device: Rolling walker (2 wheeled) Gait Pattern/deviations: Step-to pattern;Decreased stance time - right;Antalgic     General Gait Details: verbal cues for sequence, RW distance, step length  Stairs            Wheelchair Mobility    Modified Rankin (Stroke Patients Only)       Balance                                             Pertinent Vitals/Pain Pain Assessment: 0-10 Pain Score: 4  Pain Location: R  knee Pain Descriptors / Indicators: Aching Pain Intervention(s): Limited activity within patient's tolerance;Monitored during session;Patient requesting pain meds-RN notified;Repositioned;Ice applied    Home Living Family/patient expects to be discharged to:: Private residence Living Arrangements: Spouse/significant other   Type of Home: House Home Access: Level entry     Home Layout: Able to live on main level with bedroom/bathroom Home Equipment: Walker - 2 wheels      Prior Function Level of Independence: Independent               Hand Dominance        Extremity/Trunk Assessment               Lower Extremity Assessment: RLE deficits/detail RLE Deficits / Details: good quad contraction, maintained KI       Communication   Communication: No difficulties  Cognition Arousal/Alertness: Awake/alert Behavior During Therapy: WFL for tasks assessed/performed Overall Cognitive Status: Within Functional Limits for tasks assessed                      General Comments      Exercises        Assessment/Plan    PT Assessment Patient needs continued PT services  PT Diagnosis Difficulty walking;Acute pain   PT Problem List Decreased strength;Decreased range of motion;Decreased mobility;Pain;Decreased  knowledge of precautions;Decreased knowledge of use of DME  PT Treatment Interventions Functional mobility training;Gait training;DME instruction;Therapeutic activities;Therapeutic exercise;Patient/family education   PT Goals (Current goals can be found in the Care Plan section) Acute Rehab PT Goals PT Goal Formulation: With patient Time For Goal Achievement: 12/15/13 Potential to Achieve Goals: Good    Frequency 7X/week   Barriers to discharge        Co-evaluation               End of Session Equipment Utilized During Treatment: Right knee immobilizer Activity Tolerance: Patient limited by pain Patient left: in chair;with call bell/phone  within reach;with family/visitor present Nurse Communication: Patient requests pain meds         Time: 5427-0623 PT Time Calculation (min): 24 min   Charges:   PT Evaluation $Initial PT Evaluation Tier I: 1 Procedure PT Treatments $Gait Training: 8-22 mins   PT G Codes:          Mahala Rommel,KATHrine E 12/11/2013, 4:18 PM Carmelia Bake, PT, DPT 12/11/2013 Pager: [phone]

## 2013-12-11 NOTE — Anesthesia Procedure Notes (Signed)
Spinal  Patient location during procedure: OR Start time: 12/11/2013 7:10 AM End time: 12/11/2013 7:15 AM Staffing Anesthesiologist: Milana Obey Performed by: anesthesiologist  Preanesthetic Checklist Completed: patient identified, site marked, surgical consent, pre-op evaluation, timeout performed, IV checked, risks and benefits discussed and monitors and equipment checked Spinal Block Patient position: sitting Prep: Betadine Patient monitoring: heart rate, continuous pulse ox and blood pressure Approach: midline Location: L3-4 Injection technique: single-shot Needle Needle type: Sprotte  Needle gauge: 24 G Needle length: 9 cm Assessment Sensory level: T10 Additional Notes Expiration date of kit checked and confirmed. Patient tolerated procedure well, without complications.

## 2013-12-11 NOTE — Interval H&P Note (Signed)
History and Physical Interval Note:  12/11/2013 6:56 AM  [name]  has presented today for surgery, with the diagnosis of OA OF RIGHT KNEE  The various methods of treatment have been discussed with the patient and family. After consideration of risks, benefits and other options for treatment, the patient has consented to  Procedure(s): RIGHT TOTAL KNEE ARTHROPLASTY (Right) as a surgical intervention .  The patient's history has been reviewed, patient examined, no change in status, stable for surgery.  I have reviewed the patient's chart and labs.  Questions were answered to the patient's satisfaction.     Gearlean Alf

## 2013-12-11 NOTE — Anesthesia Preprocedure Evaluation (Addendum)
Anesthesia Evaluation  Patient identified by MRN, date of birth, ID band Patient awake    Reviewed: Allergy & Precautions, H&P , NPO status , Patient's Chart, lab work & pertinent test results  History of Anesthesia Complications Negative for: history of anesthetic complications  Airway Mallampati: II TM Distance: >3 FB Neck ROM: Full    Dental no notable dental hx. (+) Teeth Intact, Dental Advisory Given   Pulmonary neg pulmonary ROS,  breath sounds clear to auscultation  Pulmonary exam normal       Cardiovascular negative cardio ROS  Rhythm:Regular Rate:Normal     Neuro/Psych  Headaches, PSYCHIATRIC DISORDERS Anxiety Depression    GI/Hepatic Neg liver ROS, GERD-  Medicated and Controlled,  Endo/Other  Hypothyroidism   Renal/GU negative Renal ROS  negative genitourinary   Musculoskeletal  (+) Arthritis -, Osteoarthritis,  Fibromyalgia -  Abdominal   Peds negative pediatric ROS (+)  Hematology negative hematology ROS (+)   Anesthesia Other Findings   Reproductive/Obstetrics negative OB ROS                          Anesthesia Physical Anesthesia Plan  ASA: II  Anesthesia Plan: Spinal   Post-op Pain Management:    Induction: Intravenous  Airway Management Planned: Nasal Cannula  Additional Equipment:   Intra-op Plan:   Post-operative Plan: Extubation in OR  Informed Consent: I have reviewed the patients History and Physical, chart, labs and discussed the procedure including the risks, benefits and alternatives for the proposed anesthesia with the patient or authorized representative who has indicated his/her understanding and acceptance.   Dental advisory given  Plan Discussed with: CRNA  Anesthesia Plan Comments:        Anesthesia Quick Evaluation

## 2013-12-11 NOTE — Anesthesia Postprocedure Evaluation (Signed)
  Anesthesia Post-op Note  Patient: [name]  Procedure(s) Performed: Procedure(s) (LRB): RIGHT TOTAL KNEE ARTHROPLASTY (Right)  Patient Location: PACU  Anesthesia Type: Spinal  Level of Consciousness: awake and alert   Airway and Oxygen Therapy: Patient Spontanous Breathing  Post-op Pain: mild  Post-op Assessment: Post-op Vital signs reviewed, Patient's Cardiovascular Status Stable, Respiratory Function Stable, Patent Airway and No signs of Nausea or vomiting  Last Vitals:  Filed Vitals:   12/11/13 0934  BP: 101/65  Pulse: 91  Temp: 36.6 C  Resp: 16    Post-op Vital Signs: stable   Complications: No apparent anesthesia complications

## 2013-12-11 NOTE — H&P (View-Only) (Signed)
[name] DOB: 1951-01-13 Single / Language: [name] / Race: White Female Date Of Admission:  12-11-2013 Chief Complaint:  Right Knee Pain History of Present Illness  The patient is a 63 year old female who comes in  for a preoperative History and Physical. The patient is scheduled for a right total knee arthroplasty to be performed by Dr. Dione Plover. Aluisio, MD at [facility] on 12-11-2013. The patient reports right knee symptoms including: pain which began 9 month(s) or more ago following a specific injury (Patient states that she had surgery last June for a torn meniscus. She said that it "stepped funny" and caused this. She said that after her surgery her knee continued to hurt. She has had a cortisone injection. The pain is on both sides. She has been taking tramadol only as needed.).The patient feels that the symptoms are worsening. Symptoms are exacerbated by weight bearing. She states that the right knee is hurting her at most times now. Dr. Hilton Cork did an arthroscopy June 2014 for torn medial meniscus. Unfortunately, she has gotten worse since the arthroscopy. She feels as though the knee is now hurting at all times. The pain is medial and lateral. She had a second MRI performed in February of this year which showed a medial meniscal tear as well as severe degenerative change in the lateral compartment. The knee is hurting with most activities. She does have some pain at rest. It is starting to limit what she can and cannot do. She likes to walk and is unable to go on walks anymore because of the knee pain. She has had cortisone injection and had viscosupplement in the past. The injections have not helped. she is ready now to get the knee fixed. It is felt that due to the severe degenerative arthritic changes in the knee, the patient wold benefit from undergoing a total knee replacement. They have been treated conservatively in the past for the above stated problem and despite  conservative measures, they continue to have progressive pain and severe functional limitations and dysfunction. They have failed non-operative management including home exercise, medications, and injections. It is felt that they would benefit from undergoing total joint replacement. Risks and benefits of the procedure have been discussed with the patient and they elect to proceed with surgery. There are no active contraindications to surgery such as ongoing infection or rapidly progressive neurological disease.  Allergies  Sulfa Drugs Headache. NSAIDs GI upset Codeine Sulfate *ANALGESICS - OPIOID* Nausea - Able to take Hydrocodone and Oxycodone in the past.  Problem List/Past Medical  Primary osteoarthritis of one knee (715.16  M17.10) Osteoarthritis of right knee (715.96  M17.9) Skin Cancer Osteoarthritis Hypothyroidism Gastroesophageal Reflux Disease Hiatal Hernia Fibromyalgia Migraine Headache Irritable bowel syndrome Depression Autoimmune disorder Migraine Headache Tinnitus Osteoporosis Menopause  Family History Osteoarthritis Brother, Mother. Kidney disease Father. Hypertension Father. Rheumatoid Arthritis Mother. Osteoporosis Mother. Chronic Obstructive Lung Disease Mother. Cerebrovascular Accident Maternal Grandfather, Mother. Cancer Father. Heart Disease Father, Mother. Drug / Alcohol Addiction Father. Depression Father, Mother.  Social History Children 2 Exercise Exercises rarely; does other Current work status retired Current drinker 05/18/2013: Currently drinks wine 8-14 times per week No history of drug/alcohol rehab Marital status married Living situation live with spouse Tobacco use Never smoker. 05/18/2013 Number of flights of stairs before winded 2-3 Not under pain contract  Medication History Minocycline HCl (50MG  Capsule, Oral two times daily) Active. Cymbalta (60MG  Capsule DR Part, Oral)  Active. Abilify (2MG  Tablet, Oral) Active. Cyclobenzaprine  HCl (10MG  Tablet, Oral) Active. LORazepam (1MG  Tablet, Oral) Active. Estrace (0.1MG /GM Cream, Vaginal) Active. Dexilant (60MG  Capsule DR, Oral) Active. Linzess (290MCG Capsule, Oral) Active. [place] (170MG  Capsule, Oral) Active. Valtrex (500MG  Tablet, Oral) Active. Topamax (15MG  Cap Sprinkle, Oral) Active. Levothyroxine Sodium (125MCG Tablet, Oral) Active. Vitamin D (Ergocalciferol) (50000UNIT Capsule, Oral) Active. Imitrex (Oral) Specific dose unknown - Active. Magnesium (Oral) Specific dose unknown - Active.   Past Surgical History  Hysterectomy Date: 31. complete (non-cancerous) Hemorrhoidectomy Gallbladder Surgery laporoscopic Tonsillectomy Date: 1952. Neck Disc Surgery Cervical Fusion Cataract Surgery Date: 2014. bilateral Arthroscopy of Knee Date: 2014. right Appendectomy Date: 74. Along with the Hysterectomy Foot Surgery Bunion Left Foot Dilation and Curettage of Uterus Colon Polyp Removal - Colonoscopy Right Elbow Tendon Repair   Review of Systems  General Not Present- Chills, Fatigue, Fever, Memory Loss, Night Sweats, Weight Gain and Weight Loss. Skin Not Present- Eczema, Hives, Itching, Lesions and Rash. HEENT Not Present- Dentures, Double Vision, Headache, Hearing Loss, Tinnitus and Visual Loss. Respiratory Not Present- Allergies, Chronic Cough, Coughing up blood, Shortness of breath at rest and Shortness of breath with exertion. Cardiovascular Not Present- Chest Pain, Difficulty Breathing Lying Down, Murmur, Palpitations, Racing/skipping heartbeats and Swelling. Gastrointestinal Not Present- Abdominal Pain, Bloody Stool, Constipation, Diarrhea, Difficulty Swallowing, Heartburn, Jaundice, Loss of appetitie, Nausea and Vomiting. Female Genitourinary Not Present- Blood in Urine, Discharge, Flank Pain, Incontinence, Painful Urination, Urgency, Urinary frequency,  Urinary Retention, Urinating at Night and Weak urinary stream. Musculoskeletal Not Present- Back Pain, Joint Pain, Joint Swelling, Morning Stiffness, Muscle Pain, Muscle Weakness and Spasms. Neurological Not Present- Blackout spells, Difficulty with balance, Dizziness, Paralysis, Tremor and Weakness. Psychiatric Not Present- Insomnia.   Vitals Weight: 132 lb Height: 65in Weight was reported by patient. Height was reported by patient. Body Surface Area: 1.66 m Body Mass Index: 21.97 kg/m BP: 114/70 (Sitting, Right Arm, Standard)    Physical Exam  General Mental Status -Alert, cooperative and good historian. General Appearance-pleasant, Not in acute distress. Orientation-Oriented X3. Build & Nutrition-Petite, Well nourished and Well developed.  Head and Neck Head-normocephalic, atraumatic . Neck Global Assessment - supple, no bruit auscultated on the right, no bruit auscultated on the left.  Eye Vision-Wears corrective lenses. Pupil - Bilateral-Regular and Round. Motion - Bilateral-EOMI.  Chest and Lung Exam Auscultation Breath sounds - clear at anterior chest wall and clear at posterior chest wall. Adventitious sounds - No Adventitious sounds.  Cardiovascular Auscultation Rhythm - Regular rate and rhythm. Heart Sounds - S1 WNL and S2 WNL. Murmurs & Other Heart Sounds - Auscultation of the heart reveals - No Murmurs.  Abdomen Palpation/Percussion Tenderness - Abdomen is non-tender to palpation. Rigidity (guarding) - Abdomen is soft. Auscultation Auscultation of the abdomen reveals - Bowel sounds normal.  Musculoskeletal Note: General: A very pleasant well developed female. Alert and oriented, in no apparent distress.  Musculoskeletal: Both hips show normal range of motion with no discomfort. Extremities: Her left knee shows no effusion. Her range on the left is 0 to 135. She has no medial or lateral joint line tenderness and no instability,  left knee. Right knee: No effusion. Slight valgus deformity. Range about 5 to 120. There is marked crepitus on range of motion of the right knee. She is tender lateral greater than medial. There is no instability noted about the knee. Pulse, sensation, and motor are intact.  X-RAYS: AP both knees and lateral show that she has essentially bone on bone arthritis in the lateral compartment in her right  knee, with some patellofemoral involvement also. The left knee is unremarkable.   Assessment & Plan Osteoarthritis of right knee (715.96  M17.9) Current Plans  Started OxyCODONE HCl 5MG , 1 - 2 Tablet every four hours, as needed fpr pain, #80, 11/14/2013, No Refill. Started Robaxin 500MG , 1 (one) Tablet every six hours, as needed for spasms, #80, 11/14/2013, No Refill. Started Xarelto 10MG , 1 (one) Tablet daily, #19, 11/14/2013, No Refill. Local Order: Will take for two and a half weeks following total joint surgery.  Note:Plan is for a Right Total Knee Replacement by Dr. Wynelle Link.  Plan is to go home. The patient will likely need a hospital bed for home.  PCP - Dr. Dagmar Hait  The patient does not have any contraindications and will receive TXA (tranexamic acid) prior to surgery.  Please note that the patient was provided with three RXs preop in preparation for her upcoming surgery but she does understand that these may change depending upon how she responds to the medications postoperatively (Oxycodone for pain, Robaxin for spasm, Xarelto for DVT prophylaxsis).  Signed electronically by Joelene Millin, III PA-C

## 2013-12-12 ENCOUNTER — Encounter (HOSPITAL_COMMUNITY): Payer: Self-pay | Admitting: Orthopedic Surgery

## 2013-12-12 LAB — BASIC METABOLIC PANEL
Anion gap: 8 (ref 5–15)
BUN: 10 mg/dL (ref 6–23)
CO2: 24 mEq/L (ref 19–32)
Calcium: 7.2 mg/dL — ABNORMAL LOW (ref 8.4–10.5)
Chloride: 103 mEq/L (ref 96–112)
Creatinine, Ser: 0.79 mg/dL (ref 0.50–1.10)
GFR calc Af Amer: >90 mL/min (ref >90)
GFR calc non Af Amer: 86 mL/min — ABNORMAL LOW (ref >90)
Glucose, Bld: 135 mg/dL — ABNORMAL HIGH (ref 70–99)
Potassium: 4 mEq/L (ref 3.7–5.3)
Sodium: 135 mEq/L — ABNORMAL LOW (ref 137–147)

## 2013-12-12 LAB — CBC
HCT: 32.7 % — ABNORMAL LOW (ref 36.0–46.0)
Hemoglobin: 10.9 g/dL — ABNORMAL LOW (ref 12.0–15.0)
MCH: 33.9 pg (ref 26.0–34.0)
MCHC: 33.3 g/dL (ref 30.0–36.0)
MCV: 101.6 fL — ABNORMAL HIGH (ref 78.0–100.0)
Platelets: 167 10*3/uL (ref 150–400)
RBC: 3.22 MIL/uL — ABNORMAL LOW (ref 3.87–5.11)
RDW: 12.8 % (ref 11.5–15.5)
WBC: 7.9 10*3/uL (ref 4.0–10.5)

## 2013-12-12 MED ORDER — NON FORMULARY: 60.0000 mg | Freq: Every day | Status: DC

## 2013-12-12 MED ORDER — RIVAROXABAN 10 MG PO TABS: 10.0000 mg | ORAL_TABLET | Freq: Every day | ORAL | Status: DC

## 2013-12-12 MED ORDER — KCL IN DEXTROSE-NACL 20-5-0.9 MEQ/L-%-% IV SOLN
INTRAVENOUS | Status: DC
  Administered 2013-12-12 (×2): via INTRAVENOUS
  Filled 2013-12-12 (×2): qty 1000

## 2013-12-12 MED ORDER — METHOCARBAMOL 500 MG PO TABS: 500.0000 mg | ORAL_TABLET | Freq: Three times a day (TID) | ORAL | Status: DC | PRN

## 2013-12-12 MED ORDER — HYDROMORPHONE HCL 2 MG PO TABS
2.0000 mg | ORAL_TABLET | ORAL | Status: DC | PRN
  Administered 2013-12-12: 2 mg via ORAL
  Administered 2013-12-12: 4 mg via ORAL
  Administered 2013-12-12 (×3): 2 mg via ORAL
  Administered 2013-12-13 (×2): 4 mg via ORAL
  Filled 2013-12-12 (×2): qty 1
  Filled 2013-12-12: qty 2
  Filled 2013-12-12 (×2): qty 1
  Filled 2013-12-12 (×2): qty 2

## 2013-12-12 MED ORDER — HYDROMORPHONE HCL 2 MG PO TABS: 2.0000 mg | ORAL_TABLET | ORAL | Status: DC | PRN

## 2013-12-12 MED ORDER — DEXLANSOPRAZOLE 60 MG PO CPDR
60.0000 mg | DELAYED_RELEASE_CAPSULE | Freq: Every day | ORAL | Status: DC
  Administered 2013-12-13: 60 mg via ORAL
  Filled 2013-12-12 (×2): qty 1

## 2013-12-12 MED ORDER — SODIUM CHLORIDE 0.9 % IV BOLUS (SEPSIS)
500.0000 mL | Freq: Once | INTRAVENOUS | Status: AC
  Administered 2013-12-12: 500 mL via INTRAVENOUS

## 2013-12-12 NOTE — Progress Notes (Signed)
   Subjective: 1 Day Post-Op Procedure(s) (LRB): RIGHT TOTAL KNEE ARTHROPLASTY (Right) Patient reports pain as moderate.   Patient seen in rounds with Dr. Wynelle Link.  She had pain thru the night.  Some nausea also.  Will change the oral pain medication. She was able to waked about 20 feet yesterday with therapy. Patient is having problems with pain in the knee, requiring pain medications We resume therapy today.  Plan is to go Home after hospital stay.  Objective: Vital signs in last 24 hours: Temp:  [97.8 F (36.6 C)-98.6 F (37 C)] 98.3 F (36.8 C) (10/13 0633) Pulse Rate:  [65-91] 89 (10/13 0633) Resp:  [13-18] 18 (10/13 0800) BP: (97-124)/(53-71) 97/60 mmHg (10/13 0633) SpO2:  [97 %-100 %] 98 % (10/13 0800) FiO2 (%):  [2 %] 2 % (10/12 1028) Weight:  [60.782 kg (134 lb)] 60.782 kg (134 lb) (10/12 1046)  Intake/Output from previous day:  Intake/Output Summary (Last 24 hours) at 12/12/13 0903 Last data filed at 12/12/13 0819  Gross per 24 hour  Intake 3332.25 ml  Output   2480 ml  Net 852.25 ml    Intake/Output this shift: Total I/O In: -  Out: 400 [Urine:400]  Labs:  Recent Labs  12/12/13 0435  HGB 10.9*    Recent Labs  12/12/13 0435  WBC 7.9  RBC 3.22*  HCT 32.7*  PLT 167    Recent Labs  12/12/13 0435  NA 135*  K 4.0  CL 103  CO2 24  BUN 10  CREATININE 0.79  GLUCOSE 135*  CALCIUM 7.2*   No results found for this basename: LABPT, INR,  in the last 72 hours  EXAM General - Patient is Alert, Appropriate and Oriented Extremity - Neurovascular intact Sensation intact distally Dorsiflexion/Plantar flexion intact Dressing - dressing C/D/I Motor Function - intact, moving foot and toes well on exam.  Hemovac pulled without difficulty.  Past Medical History  Diagnosis Date  . GERD (gastroesophageal reflux disease)   . IBS (irritable bowel syndrome)   . Mental disorder     depression  . Migraines   . Abnormal Pap smear     hx of colpo and  cryo  . Thyroid disease   . Herpes   . Insomnia   . Depression   . Ulcer   . Arthritis     osteoarthritis  . Endometriosis   . Fibromyalgia     muscle spasms, joint pain triggered by stress  . History of blood transfusion 77    [place]  . Arthritis   . Osteopenia   . Meniscus tear     Right knee  . Heart murmur   . Hypothyroidism   . Anxiety     Assessment/Plan: 1 Day Post-Op Procedure(s) (LRB): RIGHT TOTAL KNEE ARTHROPLASTY (Right) Principal Problem:   OA (osteoarthritis) of knee  Estimated body mass index is 22.3 kg/(m^2) as calculated from the following:   Height as of this encounter: 5\' 5"  (1.651 m).   Weight as of this encounter: 60.782 kg (134 lb). Advance diet Up with therapy Plan for discharge tomorrow Discharge home with home health Change oral pain medication to PO Dilaudid  DVT Prophylaxis - Xarelto Weight-Bearing as tolerated to right leg D/C O2 and Pulse OX and try on Room Air  Arlee Muslim, PA-C Orthopaedic Surgery 12/12/2013, 9:03 AM

## 2013-12-12 NOTE — Evaluation (Signed)
Occupational Therapy Evaluation Patient Details Name: [name] MRN: [id number] DOB: 1950-08-08 Today's Date: 12/12/2013    History of Present Illness Pt is a 63 year old female s/p R TKA.   Clinical Impression   Pt doing well. She plans to sponge bathe initially and husband can assist with all ADL PRN. Practiced 3in1 transfers and discussed how to adjust 3in1 for appropriate height. Reviewed sequence for LB dressing with spouse assisting PRN. Feel pt will progress with functional transfers with PT. Will sign off for OT. All education completed.     Follow Up Recommendations  No OT follow up;Supervision/Assistance - 24 hour    Equipment Recommendations  3 in 1 bedside comode    Recommendations for Other Services       Precautions / Restrictions Precautions Precautions: Knee Required Braces or Orthoses: Knee Immobilizer - Right Knee Immobilizer - Right: Discontinue once straight leg raise with < 10 degree lag Restrictions Weight Bearing Restrictions: No RLE Weight Bearing: Weight bearing as tolerated      Mobility Bed Mobility Overal bed mobility: Needs Assistance Bed Mobility: Sit to Supine     Supine to sit: Min guard Sit to supine: Min assist   General bed mobility comments: assist for R LE onto bed.  Transfers Overall transfer level: Needs assistance Equipment used: Rolling walker (2 wheeled) Transfers: Sit to/from Stand Sit to Stand: Min guard         General transfer comment: verbal cues for hand placement and LE management.    Balance                                            ADL Overall ADL's : Needs assistance/impaired Eating/Feeding: Independent;Sitting   Grooming: Wash/dry hands;Set up;Sitting   Upper Body Bathing: Set up;Sitting   Lower Body Bathing: Minimal assistance;Sit to/from stand   Upper Body Dressing : Set up;Sitting   Lower Body Dressing: Minimal assistance;Sit to/from stand   Toilet Transfer: Min  guard;Ambulation;BSC;RW   Toileting- Clothing Manipulation and Hygiene: Minimal assistance;Sit to/from stand         General ADL Comments: Pt states husband can assist with LB self care until she is able. Husband present and confirms. Educated on sequence for LB dressing and use of KI until able to SLR. Pt plans to sponge bathe initially until she can go up the stairs with HH to access shower stall upstairs. Discussed use of 3in1 to sit and bathe at sink. Discussed also how to adjust 3in1 for appropriate height.      Vision                     Perception     Praxis      Pertinent Vitals/Pain Pain Assessment: 0-10 Pain Score: 3  Pain Location: R knee Pain Descriptors / Indicators: Aching Pain Intervention(s): Repositioned;Ice applied     Hand Dominance     Extremity/Trunk Assessment Upper Extremity Assessment Upper Extremity Assessment: Overall WFL for tasks assessed           Communication Communication Communication: No difficulties   Cognition Arousal/Alertness: Awake/alert Behavior During Therapy: WFL for tasks assessed/performed Overall Cognitive Status: Within Functional Limits for tasks assessed                     General Comments       Exercises  Shoulder Instructions      Home Living Family/patient expects to be discharged to:: Private residence Living Arrangements: Spouse/significant other   Type of Home: House Home Access: Level entry     Home Layout: Able to live on main level with bedroom/bathroom     Bathroom Shower/Tub: Walk-in shower (upstairs)   Bathroom Toilet: Standard     Home Equipment: Environmental consultant - 2 wheels          Prior Functioning/Environment Level of Independence: Independent             OT Diagnosis: Generalized weakness   OT Problem List:     OT Treatment/Interventions:      OT Goals(Current goals can be found in the care plan section) Acute Rehab OT Goals Patient Stated Goal: home when  ready  OT Frequency:     Barriers to D/C:            Co-evaluation              End of Session Equipment Utilized During Treatment: Gait belt;Rolling walker  Activity Tolerance: Patient tolerated treatment well Patient left: in bed;with call bell/phone within reach;with family/visitor present   Time: 1010-1034 OT Time Calculation (min): 24 min Charges:  OT General Charges $OT Visit: 1 Procedure OT Evaluation $Initial OT Evaluation Tier I: 1 Procedure OT Treatments $Therapeutic Activity: 8-22 mins G-Codes:    Jules Schick [phone] 12/12/2013, 12:30 PM

## 2013-12-12 NOTE — Progress Notes (Signed)
Physical Therapy Treatment Patient Details Name: MAANASA ADERHOLD MRN: [id number] DOB: 05/02/1950 Today's Date: 12/12/2013    History of Present Illness Pt is a 63 year old female s/p R TKA.    PT Comments    Pt ambulated short distance in hallway and performed LE exercises.  Pt reports possible d/c home tomorrow.  Follow Up Recommendations  Home health PT     Equipment Recommendations  None recommended by PT    Recommendations for Other Services       Precautions / Restrictions Precautions Precautions: Knee Required Braces or Orthoses: Knee Immobilizer - Right Knee Immobilizer - Right: Discontinue once straight leg raise with < 10 degree lag Restrictions Weight Bearing Restrictions: No RLE Weight Bearing: Weight bearing as tolerated    Mobility  Bed Mobility Overal bed mobility: Needs Assistance Bed Mobility: Supine to Sit     Supine to sit: Min guard Sit to supine: Min assist   General bed mobility comments: verbal cues for self assist  Transfers Overall transfer level: Needs assistance Equipment used: Rolling walker (2 wheeled) Transfers: Sit to/from Stand Sit to Stand: Min guard         General transfer comment: verbal cues for UE and LE positioning  Ambulation/Gait Ambulation/Gait assistance: Min guard Ambulation Distance (Feet): 40 Feet Assistive device: Rolling walker (2 wheeled) Gait Pattern/deviations: Step-through pattern;Antalgic;Decreased stance time - right     General Gait Details: verbal cues for sequence, RW distance, step length   Stairs            Wheelchair Mobility    Modified Rankin (Stroke Patients Only)       Balance                                    Cognition Arousal/Alertness: Awake/alert Behavior During Therapy: WFL for tasks assessed/performed Overall Cognitive Status: Within Functional Limits for tasks assessed                      Exercises Total Joint Exercises Ankle  Circles/Pumps: AROM;Both;15 reps Quad Sets: AROM;Both;15 reps Short Arc QuadSinclair Ship;Right;10 reps Heel Slides: AAROM;Right;10 reps Hip ABduction/ADduction: AAROM;Right;10 reps Straight Leg Raises: AAROM;Right;10 reps    General Comments        Pertinent Vitals/Pain Pain Assessment: 0-10 Pain Score: 3  Pain Location: R knee Pain Descriptors / Indicators: Aching Pain Intervention(s): Limited activity within patient's tolerance;Premedicated before session;Repositioned;Ice applied    Home Living Family/patient expects to be discharged to:: Private residence Living Arrangements: Spouse/significant other   Type of Home: House Home Access: Level entry   Home Layout: Able to live on main level with bedroom/bathroom Home Equipment: Walker - 2 wheels      Prior Function Level of Independence: Independent          PT Goals (current goals can now be found in the care plan section) Acute Rehab PT Goals Patient Stated Goal: home when ready Progress towards PT goals: Progressing toward goals    Frequency  7X/week    PT Plan Current plan remains appropriate    Co-evaluation             End of Session Equipment Utilized During Treatment: Right knee immobilizer Activity Tolerance: Patient limited by pain;Patient limited by fatigue Patient left: in chair;with call bell/phone within reach;with family/visitor present     Time: 0912-0937 PT Time Calculation (min): 25 min  Charges:  $Gait  Training: 8-22 mins $Therapeutic Exercise: 8-22 mins                    G Codes:      Temitope Griffing,KATHrine E 01-01-14, 12:40 PM Carmelia Bake, PT, DPT 01-Jan-2014 Pager: [phone]

## 2013-12-12 NOTE — Discharge Instructions (Addendum)
° °Dr. Frank Aluisio °Total Joint Specialist °[facility] °[address] °[place], [place] [address] °[phone] ° °TOTAL KNEE REPLACEMENT POSTOPERATIVE DIRECTIONS ° ° ° °Knee Rehabilitation, Guidelines Following Surgery  °Results after knee surgery are often greatly improved when you follow the exercise, range of motion and muscle strengthening exercises prescribed by your doctor. Safety measures are also important to protect the knee from further injury. Any time any of these exercises cause you to have increased pain or swelling in your knee joint, decrease the amount until you are comfortable again and slowly increase them. If you have problems or questions, call your caregiver or physical therapist for advice.  ° °HOME CARE INSTRUCTIONS  °Remove items at home which could result in a fall. This includes throw rugs or furniture in walking pathways.  °Continue medications as instructed at time of discharge. °You may have some home medications which will be placed on hold until you complete the course of blood thinner medication.  °You may start showering once you are discharged home but do not submerge the incision under water. Just pat the incision dry and apply a dry gauze dressing on daily. °Walk with walker as instructed.  °You may resume a sexual relationship in one month or when given the OK by  your doctor.  °· Use walker as long as suggested by your caregivers. °· Avoid periods of inactivity such as sitting longer than an hour when not asleep. This helps prevent blood clots.  °You may put full weight on your legs and walk as much as is comfortable.  °You may return to work once you are cleared by your doctor.  °Do not drive a car for 6 weeks or until released by you surgeon.  °· Do not drive while taking narcotics.  °Wear the elastic stockings for three weeks following surgery during the day but you may remove then at night. °Make sure you keep all of your appointments after your  operation with all of your doctors and caregivers. You should call the office at the above phone number and make an appointment for approximately two weeks after the date of your surgery. °Change the dressing daily and reapply a dry dressing each time. °Please pick up a stool softener and laxative for home use as long as you are requiring pain medications. °· Continue to use ice on the knee for pain and swelling from surgery. You may notice swelling that will progress down to the foot and ankle.  This is normal after surgery.  Elevate the leg when you are not up walking on it.   °It is important for you to complete the blood thinner medication as prescribed by your doctor. °· Continue to use the breathing machine which will help keep your temperature down.  It is common for your temperature to cycle up and down following surgery, especially at night when you are not up moving around and exerting yourself.  The breathing machine keeps your lungs expanded and your temperature down. ° °RANGE OF MOTION AND STRENGTHENING EXERCISES  °Rehabilitation of the knee is important following a knee injury or an operation. After just a few days of immobilization, the muscles of the thigh which control the knee become weakened and shrink (atrophy). Knee exercises are designed to build up the tone and strength of the thigh muscles and to improve knee motion. Often times heat used for twenty to thirty minutes before working out will loosen up your tissues and help with improving the   range of motion but do not use heat for the first two weeks following surgery. These exercises can be done on a training (exercise) mat, on the floor, on a table or on a bed. Use what ever works the best and is most comfortable for you Knee exercises include:  Leg Lifts - While your knee is still immobilized in a splint or cast, you can do straight leg raises. Lift the leg to 60 degrees, hold for 3 sec, and slowly lower the leg. Repeat 10-20 times 2-3  times daily. Perform this exercise against resistance later as your knee gets better.  Quad and Hamstring Sets - Tighten up the muscle on the front of the thigh (Quad) and hold for 5-10 sec. Repeat this 10-20 times hourly. Hamstring sets are done by pushing the foot backward against an object and holding for 5-10 sec. Repeat as with quad sets.  A rehabilitation program following serious knee injuries can speed recovery and prevent re-injury in the future due to weakened muscles. Contact your doctor or a physical therapist for more information on knee rehabilitation.   SKILLED REHAB INSTRUCTIONS: If the patient is transferred to a skilled rehab facility following release from the hospital, a list of the current medications will be sent to the facility for the patient to continue.  When discharged from the skilled rehab facility, please have the facility set up the patient's [place] prior to being released. Also, the skilled facility will be responsible for providing the patient with their medications at time of release from the facility to include their pain medication, the muscle relaxants, and their blood thinner medication. If the patient is still at the rehab facility at time of the two week follow up appointment, the skilled rehab facility will also need to assist the patient in arranging follow up appointment in our office and any transportation needs.  MAKE SURE YOU:  Understand these instructions.  Will watch your condition.  Will get help right away if you are not doing well or get worse.    Pick up stool softner and laxative for home. Do not submerge incision under water. May shower. Continue to use ice for pain and swelling from surgery.  Take Xarelto for two and a half more weeks, then discontinue Xarelto. Once the patient has completed the blood thinner regimen, then take a Baby 81 mg Aspirin daily for three more weeks.    Information on my medicine - XARELTO  (Rivaroxaban)  This medication education was reviewed with me or my healthcare representative as part of my discharge preparation.  The pharmacist that spoke with me during my hospital stay was:  Emiliano Dyer, RPH  Why was Xarelto prescribed for you? Xarelto was prescribed for you to reduce the risk of blood clots forming after orthopedic surgery. The medical term for these abnormal blood clots is venous thromboembolism (VTE).  What do you need to know about xarelto ? Take your Xarelto ONCE DAILY at the same time every day. You may take it either with or without food.  If you have difficulty swallowing the tablet whole, you may crush it and mix in applesauce just prior to taking your dose.  Take Xarelto exactly as prescribed by your doctor and DO NOT stop taking Xarelto without talking to the doctor who prescribed the medication.  Stopping without other VTE prevention medication to take the place of Xarelto may increase your risk of developing a clot.  After discharge, you should have regular  check-up appointments with your healthcare provider that is prescribing your Xarelto.    What do you do if you miss a dose? If you miss a dose, take it as soon as you remember on the same day then continue your regularly scheduled once daily regimen the next day. Do not take two doses of Xarelto on the same day.   Important Safety Information A possible side effect of Xarelto is bleeding. You should call your healthcare provider right away if you experience any of the following:   Bleeding from an injury or your nose that does not stop.   Unusual colored urine (red or dark brown) or unusual colored stools (red or black).   Unusual bruising for unknown reasons.   A serious fall or if you hit your head (even if there is no bleeding).  Some medicines may interact with Xarelto and might increase your risk of bleeding while on Xarelto. To help avoid this, consult your healthcare provider  or pharmacist prior to using any new prescription or non-prescription medications, including herbals, vitamins, non-steroidal anti-inflammatory drugs (NSAIDs) and supplements.  This website has more information on Xarelto: [email or web address]

## 2013-12-12 NOTE — Progress Notes (Signed)
Physical Therapy Treatment Note   12/12/13 1600  PT Visit Information  Last PT Received On 12/12/13  Assistance Needed +1  History of Present Illness Pt is a 63 year old female s/p R TKA.  PT Time Calculation  PT Start Time 1441  PT Stop Time 1456  PT Time Calculation (min) 15 min  Subjective Data  Subjective Pt ambulated in hallway and then assisted back to bed.  Pt reports plan to d/c home tomorrow.  Precautions  Precautions Knee  Required Braces or Orthoses Knee Immobilizer - Right  Knee Immobilizer - Right Discontinue once straight leg raise with < 10 degree lag  Restrictions  RLE Weight Bearing WBAT  Pain Assessment  Pain Assessment 0-10  Pain Score 2  Pain Location R knee  Pain Descriptors / Indicators Aching  Pain Intervention(s) Limited activity within patient's tolerance;Repositioned  Cognition  Arousal/Alertness Awake/alert  Behavior During Therapy WFL for tasks assessed/performed  Overall Cognitive Status Within Functional Limits for tasks assessed  Bed Mobility  Overal bed mobility Needs Assistance  Bed Mobility Supine to Sit;Sit to Supine  Supine to sit Supervision  Sit to supine Supervision  General bed mobility comments verbal cues for self assist  Transfers  Overall transfer level Needs assistance  Equipment used Rolling walker (2 wheeled)  Transfers Sit to/from Stand  Sit to Stand Min guard  General transfer comment verbal cues for UE and LE positioning  Ambulation/Gait  Ambulation/Gait assistance Min guard;Supervision  Ambulation Distance (Feet) 80 Feet  Assistive device Rolling walker (2 wheeled)  Gait Pattern/deviations Step-to pattern;Antalgic  General Gait Details verbal cues for sequence, RW distance, step length  PT - End of Session  Equipment Utilized During Treatment Right knee immobilizer  Activity Tolerance Patient tolerated treatment well  Patient left in bed;with call bell/phone within reach;with family/visitor present  PT -  Assessment/Plan  PT Plan Current plan remains appropriate  PT Frequency 7X/week  Follow Up Recommendations Home health PT  PT equipment None recommended by PT  PT Goal Progression  Progress towards PT goals Progressing toward goals  PT General Charges  $$ ACUTE PT VISIT 1 Procedure  PT Treatments  $Gait Training 8-22 mins   Carmelia Bake, PT, DPT 12/12/2013 Pager: [phone]

## 2013-12-12 NOTE — Progress Notes (Signed)
Utilization review completed.  

## 2013-12-12 NOTE — Care Management Note (Signed)
    Page 1 of 2   12/12/2013     12:42:17 PM CARE MANAGEMENT NOTE 12/12/2013  Patient:  [name], [name]   Account Number:  [id number]  Date Initiated:  12/12/2013  Documentation initiated by:  [facility]  Subjective/Objective Assessment:   adm: RIGHT TOTAL KNEE ARTHROPLASTY (Right)     Action/Plan:   discharge planning   Anticipated DC Date:  12/13/2013   Anticipated DC Plan:  [place]  CM consult      [place]   Choice offered to / List presented to:  C-1 Patient   DME arranged  3-N-1  [place]      DME agency  [employer]     [place] arranged  HH-2 PT      [place] agency  [facility]   Status of service:  Completed, signed off Medicare Important Message given?   (If response is "NO", the following Medicare IM given date fields will be blank) Date Medicare IM given:   Medicare IM given by:   Date Additional Medicare IM given:   Additional Medicare IM given by:    Discharge Disposition:  [place]  Per UR Regulation:    If discussed at Long Length of Stay Meetings, dates discussed:    Comments:  12/12/13 12:30 Cm received call from [employer] rep, Brent who states DME complete for pt.  CM spoke with pt who confirms bed, rolling walker, and 3n1 delivered by [employer].  Pt chooses [facility] to render HHPT.  Shaune Leeks on unit and aware of choice.  Address and contact information verified with pt and husband, [name] adds [phone].  No other CM needs were communicated.  Mariane Masters, BSN, [place].

## 2013-12-12 NOTE — Discharge Summary (Signed)
Physician Discharge Summary   Patient ID: LOVELY KERINS MRN: [id number] DOB/AGE: Nov 23, 1950 63 y.o.  Admit date: 12/11/2013 Discharge date: 12/13/2013  Primary Diagnosis:  Osteoarthritis Right knee(s)  Admission Diagnoses:  Past Medical History  Diagnosis Date  . GERD (gastroesophageal reflux disease)   . IBS (irritable bowel syndrome)   . Mental disorder     depression  . Migraines   . Abnormal Pap smear     hx of colpo and cryo  . Thyroid disease   . Herpes   . Insomnia   . Depression   . Ulcer   . Arthritis     osteoarthritis  . Endometriosis   . Fibromyalgia     muscle spasms, joint pain triggered by stress  . History of blood transfusion 77    [place]  . Arthritis   . Osteopenia   . Meniscus tear     Right knee  . Heart murmur   . Hypothyroidism   . Anxiety    Discharge Diagnoses:   Principal Problem:   OA (osteoarthritis) of knee  Estimated body mass index is 22.3 kg/(m^2) as calculated from the following:   Height as of this encounter: 5' 5"  (1.651 m).   Weight as of this encounter: 60.782 kg (134 lb).  Procedure:  Procedure(s) (LRB): RIGHT TOTAL KNEE ARTHROPLASTY (Right)   Consults: None  HPI: [name] is a 63 y.o. year old female with end stage OA of her right knee with progressively worsening pain and dysfunction. She has constant pain, with activity and at rest and significant functional deficits with difficulties even with ADLs. She has had extensive non-op management including analgesics, injections of cortisone and viscosupplements, and home exercise program, but remains in significant pain with significant dysfunction.Radiographs show bone on bone arthritis lateral and patellofemoral. She presents now for right Total Knee Arthroplasty.  Laboratory Data: Admission on 12/11/2013, Discharged on 12/13/2013  Component Date Value Ref Range Status  . ABO/RH(D) 12/11/2013 O POS   Final  . Antibody Screen 12/11/2013 NEG   Final  . Sample  Expiration 12/11/2013 12/14/2013   Final  . ABO/RH(D) 12/11/2013 O POS   Final  . WBC 12/12/2013 7.9  4.0 - 10.5 K/uL Final  . RBC 12/12/2013 3.22* 3.87 - 5.11 MIL/uL Final  . Hemoglobin 12/12/2013 10.9* 12.0 - 15.0 g/dL Final  . HCT 12/12/2013 32.7* 36.0 - 46.0 % Final  . MCV 12/12/2013 101.6* 78.0 - 100.0 fL Final  . MCH 12/12/2013 33.9  26.0 - 34.0 pg Final  . MCHC 12/12/2013 33.3  30.0 - 36.0 g/dL Final  . RDW 12/12/2013 12.8  11.5 - 15.5 % Final  . Platelets 12/12/2013 167  150 - 400 K/uL Final  . Sodium 12/12/2013 135* 137 - 147 mEq/L Final  . Potassium 12/12/2013 4.0  3.7 - 5.3 mEq/L Final  . Chloride 12/12/2013 103  96 - 112 mEq/L Final  . CO2 12/12/2013 24  19 - 32 mEq/L Final  . Glucose, Bld 12/12/2013 135* 70 - 99 mg/dL Final  . BUN 12/12/2013 10  6 - 23 mg/dL Final  . Creatinine, Ser 12/12/2013 0.79  0.50 - 1.10 mg/dL Final  . Calcium 12/12/2013 7.2* 8.4 - 10.5 mg/dL Final  . GFR calc non Af Amer 12/12/2013 86* >90 mL/min Final  . GFR calc Af Amer 12/12/2013 >90  >90 mL/min Final   Comment: (NOTE)  The eGFR has been calculated using the CKD EPI equation.                          This calculation has not been validated in all clinical situations.                          eGFR's persistently <90 mL/min signify possible Chronic Kidney                          Disease.  . Anion gap 12/12/2013 8  5 - 15 Final  . WBC 12/13/2013 8.2  4.0 - 10.5 K/uL Final  . RBC 12/13/2013 2.84* 3.87 - 5.11 MIL/uL Final  . Hemoglobin 12/13/2013 9.8* 12.0 - 15.0 g/dL Final  . HCT 12/13/2013 29.3* 36.0 - 46.0 % Final  . MCV 12/13/2013 103.2* 78.0 - 100.0 fL Final  . MCH 12/13/2013 34.5* 26.0 - 34.0 pg Final  . MCHC 12/13/2013 33.4  30.0 - 36.0 g/dL Final  . RDW 12/13/2013 13.2  11.5 - 15.5 % Final  . Platelets 12/13/2013 163  150 - 400 K/uL Final  . Sodium 12/13/2013 141  137 - 147 mEq/L Final  . Potassium 12/13/2013 4.0  3.7 - 5.3 mEq/L Final  . Chloride 12/13/2013  109  96 - 112 mEq/L Final  . CO2 12/13/2013 22  19 - 32 mEq/L Final  . Glucose, Bld 12/13/2013 100* 70 - 99 mg/dL Final  . BUN 12/13/2013 6  6 - 23 mg/dL Final  . Creatinine, Ser 12/13/2013 0.76  0.50 - 1.10 mg/dL Final  . Calcium 12/13/2013 7.9* 8.4 - 10.5 mg/dL Final  . GFR calc non Af Amer 12/13/2013 87* >90 mL/min Final  . GFR calc Af Amer 12/13/2013 >90  >90 mL/min Final   Comment: (NOTE)                          The eGFR has been calculated using the CKD EPI equation.                          This calculation has not been validated in all clinical situations.                          eGFR's persistently <90 mL/min signify possible Chronic Kidney                          Disease.  Georgiann Hahn gap 12/13/2013 10  5 - 15 Final  Hospital Outpatient Visit on 11/30/2013  Component Date Value Ref Range Status  . aPTT 11/30/2013 29  24 - 37 seconds Final  . WBC 11/30/2013 5.1  4.0 - 10.5 K/uL Final  . RBC 11/30/2013 4.37  3.87 - 5.11 MIL/uL Final  . Hemoglobin 11/30/2013 14.8  12.0 - 15.0 g/dL Final  . HCT 11/30/2013 44.5  36.0 - 46.0 % Final  . MCV 11/30/2013 101.8* 78.0 - 100.0 fL Final  . MCH 11/30/2013 33.9  26.0 - 34.0 pg Final  . MCHC 11/30/2013 33.3  30.0 - 36.0 g/dL Final  . RDW 11/30/2013 13.5  11.5 - 15.5 % Final  . Platelets 11/30/2013 250  150 - 400 K/uL Final  . Sodium 11/30/2013 135* 137 - 147 mEq/L Final  .  Potassium 11/30/2013 3.8  3.7 - 5.3 mEq/L Final  . Chloride 11/30/2013 100  96 - 112 mEq/L Final  . CO2 11/30/2013 25  19 - 32 mEq/L Final  . Glucose, Bld 11/30/2013 84  70 - 99 mg/dL Final  . BUN 11/30/2013 12  6 - 23 mg/dL Final  . Creatinine, Ser 11/30/2013 0.86  0.50 - 1.10 mg/dL Final  . Calcium 11/30/2013 8.8  8.4 - 10.5 mg/dL Final  . Total Protein 11/30/2013 8.4* 6.0 - 8.3 g/dL Final  . Albumin 11/30/2013 4.2  3.5 - 5.2 g/dL Final  . AST 11/30/2013 26  0 - 37 U/L Final  . ALT 11/30/2013 26  0 - 35 U/L Final  . Alkaline Phosphatase 11/30/2013 70  39 - 117 U/L  Final  . Total Bilirubin 11/30/2013 0.3  0.3 - 1.2 mg/dL Final  . GFR calc non Af Amer 11/30/2013 70* >90 mL/min Final  . GFR calc Af Amer 11/30/2013 81* >90 mL/min Final   Comment: (NOTE)                          The eGFR has been calculated using the CKD EPI equation.                          This calculation has not been validated in all clinical situations.                          eGFR's persistently <90 mL/min signify possible Chronic Kidney                          Disease.  . Anion gap 11/30/2013 10  5 - 15 Final  . Prothrombin Time 11/30/2013 13.3  11.6 - 15.2 seconds Final  . INR 11/30/2013 1.00  0.00 - 1.49 Final  . Color, Urine 11/30/2013 YELLOW  YELLOW Final  . APPearance 11/30/2013 CLEAR  CLEAR Final  . Specific Gravity, Urine 11/30/2013 1.004* 1.005 - 1.030 Final  . pH 11/30/2013 7.5  5.0 - 8.0 Final  . Glucose, UA 11/30/2013 NEGATIVE  NEGATIVE mg/dL Final  . Hgb urine dipstick 11/30/2013 NEGATIVE  NEGATIVE Final  . Bilirubin Urine 11/30/2013 NEGATIVE  NEGATIVE Final  . Ketones, ur 11/30/2013 NEGATIVE  NEGATIVE mg/dL Final  . Protein, ur 11/30/2013 NEGATIVE  NEGATIVE mg/dL Final  . Urobilinogen, UA 11/30/2013 0.2  0.0 - 1.0 mg/dL Final  . Nitrite 11/30/2013 NEGATIVE  NEGATIVE Final  . Leukocytes, UA 11/30/2013 NEGATIVE  NEGATIVE Final   MICROSCOPIC NOT DONE ON URINES WITH NEGATIVE PROTEIN, BLOOD, LEUKOCYTES, NITRITE, OR GLUCOSE <1000 mg/dL.  Marland Kitchen MRSA, PCR 11/30/2013 NEGATIVE  NEGATIVE Final  . Staphylococcus aureus 11/30/2013 NEGATIVE  NEGATIVE Final   Comment:                                 The Xpert SA Assay (FDA                          approved for NASAL specimens                          in patients over 40 years of age),  is one component of                          a comprehensive surveillance                          program.  Test performance has                          been validated by [facility] for  patients greater                          than or equal to 69 year old.                          It is not intended                          to diagnose infection nor to                          guide or monitor treatment.     X-Rays:No results found.  EKG: Orders placed during the hospital encounter of 07/05/12  . EKG     Hospital Course: [name] is a 63 y.o. who was admitted to [facility]. They were brought to the operating room on 12/11/2013 and underwent Procedure(s): RIGHT TOTAL KNEE ARTHROPLASTY.  Patient tolerated the procedure well and was later transferred to the recovery room and then to the orthopaedic floor for postoperative care.  They were given PO and IV analgesics for pain control following their surgery.  They were given 24 hours of postoperative antibiotics of  Anti-infectives   Start     Dose/Rate Route Frequency Ordered Stop   12/12/13 1000  valACYclovir (VALTREX) tablet 500 mg  Status:  Discontinued     500 mg Oral Daily 12/11/13 0944 12/13/13 1257   12/11/13 1400  ceFAZolin (ANCEF) IVPB 2 g/50 mL premix     2 g 100 mL/hr over 30 Minutes Intravenous Every 6 hours 12/11/13 0944 12/11/13 2246   12/11/13 0537  ceFAZolin (ANCEF) IVPB 2 g/50 mL premix     2 g 100 mL/hr over 30 Minutes Intravenous On call to O.R. 12/11/13 8295 12/11/13 0705     and started on DVT prophylaxis in the form of Xarelto.   PT and OT were ordered for total joint protocol.  Discharge planning consulted to help with postop disposition and equipment needs.  Patient had a tough night on the evening of surgery. Patient seen in rounds with Dr. Wynelle Link. She had pain thru the night. Some nausea also. Changed the oral pain medication. She was able to waked about 20 feet yesterday with therapy the day of surgery.  They started to get up OOB with therapy again on day one. Hemovac drain was pulled without difficulty.  Continued to work with therapy into day two.  Dressing was changed on day two  and the incision was healing well.   Patient was seen in rounds on POD 2.  She was feeling better and was ready to go home.  Discharge home with home health  Diet - Regular diet  Follow up - in 2 weeks  Activity - WBAT  Disposition - Home  Condition Upon Discharge - Good  D/C Meds - See DC Summary  DVT Prophylaxis - Xarelto       Discharge Instructions   Call MD / Call 911    Complete by:  As directed   If you experience chest pain or shortness of breath, CALL 911 and be transported to the hospital emergency room.  If you develope a fever above 101 F, pus (white drainage) or increased drainage or redness at the wound, or calf pain, call your surgeon's office.     Change dressing    Complete by:  As directed   Change dressing daily with sterile 4 x 4 inch gauze dressing and apply TED hose. Do not submerge the incision under water.     Constipation Prevention    Complete by:  As directed   Drink plenty of fluids.  Prune juice may be helpful.  You may use a stool softener, such as Colace (over the counter) 100 mg twice a day.  Use MiraLax (over the counter) for constipation as needed.     Diet - low sodium heart healthy    Complete by:  As directed      Discharge instructions    Complete by:  As directed   Pick up stool softner and laxative for home. Do not submerge incision under water. May shower. Continue to use ice for pain and swelling from surgery.  Take Xarelto for two and a half more weeks, then discontinue Xarelto. Once the patient has completed the blood thinner regimen, then take a Baby 81 mg Aspirin daily for three more weeks.     Do not put a pillow under the knee. Place it under the heel.    Complete by:  As directed      Do not sit on low chairs, stoools or toilet seats, as it may be difficult to get up from low surfaces    Complete by:  As directed      Driving restrictions    Complete by:  As directed   No driving until released by the physician.     Increase  activity slowly as tolerated    Complete by:  As directed      Lifting restrictions    Complete by:  As directed   No lifting until released by the physician.     Patient may shower    Complete by:  As directed   You may shower without a dressing once there is no drainage.  Do not wash over the wound.  If drainage remains, do not shower until drainage stops.     TED hose    Complete by:  As directed   Use stockings (TED hose) for 3 weeks on both leg(s).  You may remove them at night for sleeping.     Weight bearing as tolerated    Complete by:  As directed   Laterality:  right  Extremity:  Lower            Medication List    STOP taking these medications       estradiol 0.1 MG/GM vaginal cream  Commonly known as:  ESTRACE     estradiol 0.5 MG tablet  Commonly known as:  ESTRACE     OVER THE COUNTER MEDICATION     PROBIOTIC DAILY PO     STRESS B COMPLEX PO  Vitamin D (Ergocalciferol) 50000 UNITS Caps capsule  Commonly known as:  DRISDOL     Vitamin D-3 1000 UNITS Caps      TAKE these medications       acetaminophen 500 MG tablet  Commonly known as:  TYLENOL  Take 1,000 mg by mouth once as needed for mild pain or headache.     ARIPiprazole 5 MG tablet  Commonly known as:  ABILIFY  Take 2.5 mg by mouth at bedtime.     cyclobenzaprine 10 MG tablet  Commonly known as:  FLEXERIL  Take 10 mg by mouth at bedtime.     dexlansoprazole 60 MG capsule  Commonly known as:  DEXILANT  Take 60 mg by mouth every morning. Takes with a glass of water.     DULoxetine 60 MG capsule  Commonly known as:  CYMBALTA  Take 60 mg by mouth every morning.     HYDROmorphone 2 MG tablet  Commonly known as:  DILAUDID  Take 1-2 tablets (2-4 mg total) by mouth every 3 (three) hours as needed for moderate pain or severe pain.     levothyroxine 125 MCG tablet  Commonly known as:  SYNTHROID, LEVOTHROID  Take 125 mcg by mouth daily before breakfast. One hour before meal.     LINZESS  290 MCG Caps capsule  Generic drug:  Linaclotide  Take 1 capsule by mouth every morning. Takes with a glass of water.     LORazepam 1 MG tablet  Commonly known as:  ATIVAN  Take 2 mg by mouth at bedtime.     MAGNESIUM PO  Take 3-4 tablets by mouth 2 (two) times daily. 3 tablets in the am and 4 tablets at night     methocarbamol 500 MG tablet  Commonly known as:  ROBAXIN  Take 1 tablet (500 mg total) by mouth every 8 (eight) hours as needed for muscle spasms.     OVER THE COUNTER MEDICATION  Eye health capsule daily- last dose on 11/28/2013     rivaroxaban 10 MG Tabs tablet  Commonly known as:  XARELTO  - Take 1 tablet (10 mg total) by mouth daily with breakfast. Take Xarelto for two and a half more weeks, then discontinue Xarelto.  - Once the patient has completed the blood thinner regimen, then take a Baby 81 mg Aspirin daily for three more weeks.     STOOL SOFTENER PO  Take 3 tablets by mouth 2 (two) times daily.     SUMAtriptan 6 MG/0.5ML Soln injection  Commonly known as:  IMITREX  Inject 6 mg into the skin every 2 (two) hours as needed for migraine or headache. F     SYSTANE OP  Place 1 drop into both eyes 2 (two) times daily.     topiramate 50 MG tablet  Commonly known as:  TOPAMAX  Take 150 mg by mouth at bedtime.     valACYclovir 500 MG tablet  Commonly known as:  VALTREX  Take 500 mg by mouth every morning.       Follow-up Information   Follow up with [facility] (home health physical therapy)    Contact information:   [address] [place] [address] [phone]       Follow up with Gearlean Alf, MD. Schedule an appointment as soon as possible for a visit on 12/26/2013. (Call [phone] tomorrow to make the appointment)    Specialty:  Orthopedic Surgery   Contact information:   [address] [place] [address] [phone]       Signed: Arlee Muslim, PA-C Orthopaedic Surgery 12/28/2013, 10:05  AM

## 2013-12-13 LAB — BASIC METABOLIC PANEL
Anion gap: 10 (ref 5–15)
BUN: 6 mg/dL (ref 6–23)
CO2: 22 mEq/L (ref 19–32)
Calcium: 7.9 mg/dL — ABNORMAL LOW (ref 8.4–10.5)
Chloride: 109 mEq/L (ref 96–112)
Creatinine, Ser: 0.76 mg/dL (ref 0.50–1.10)
GFR calc Af Amer: >90 mL/min (ref >90)
GFR calc non Af Amer: 87 mL/min — ABNORMAL LOW (ref >90)
Glucose, Bld: 100 mg/dL — ABNORMAL HIGH (ref 70–99)
Potassium: 4 mEq/L (ref 3.7–5.3)
Sodium: 141 mEq/L (ref 137–147)

## 2013-12-13 LAB — CBC
HCT: 29.3 % — ABNORMAL LOW (ref 36.0–46.0)
Hemoglobin: 9.8 g/dL — ABNORMAL LOW (ref 12.0–15.0)
MCH: 34.5 pg — ABNORMAL HIGH (ref 26.0–34.0)
MCHC: 33.4 g/dL (ref 30.0–36.0)
MCV: 103.2 fL — ABNORMAL HIGH (ref 78.0–100.0)
Platelets: 163 10*3/uL (ref 150–400)
RBC: 2.84 MIL/uL — ABNORMAL LOW (ref 3.87–5.11)
RDW: 13.2 % (ref 11.5–15.5)
WBC: 8.2 10*3/uL (ref 4.0–10.5)

## 2013-12-13 NOTE — Progress Notes (Signed)
Physical Therapy Treatment Patient Details Name: [name] MRN: [id number] DOB: [birth year] Today's Date: 12/13/2013    History of Present Illness Pt is a 63 year old female s/p R TKA.    PT Comments    Pt ambulated in hallway and practiced stair technique.  Spouse into room end of session so verbally educated on stairs as well.  Pt performed LE exercises and provided with HEP.  Pt did not use KI today with mobility and no buckling observed however instructed pt to wear home for safety.  Pt and spouse had no further questions/concerns, and pt to d/c home.  Follow Up Recommendations  Home health PT     Equipment Recommendations  None recommended by PT    Recommendations for Other Services       Precautions / Restrictions Precautions Precautions: Knee Required Braces or Orthoses: Knee Immobilizer - Right Knee Immobilizer - Right: Discontinue once straight leg raise with < 10 degree lag Restrictions RLE Weight Bearing: Weight bearing as tolerated    Mobility  Bed Mobility Overal bed mobility: Needs Assistance Bed Mobility: Supine to Sit     Supine to sit: Supervision     General bed mobility comments: verbal cues for self assist  Transfers Overall transfer level: Needs assistance Equipment used: Rolling walker (2 wheeled) Transfers: Sit to/from Stand Sit to Stand: Supervision         General transfer comment: verbal cues for UE and LE positioning  Ambulation/Gait Ambulation/Gait assistance: Supervision Ambulation Distance (Feet): 80 Feet Assistive device: Rolling walker (2 wheeled) Gait Pattern/deviations: Step-to pattern;Antalgic     General Gait Details: verbal cues for RW distance, step length   Stairs Stairs: Yes Stairs assistance: Min guard Stair Management: Step to pattern;Forwards;One rail Left Number of Stairs: 4 General stair comments: pt performed stairs with L rail and person assist on R side, reports shower upstairs so recommended spouse  assist pt for safety, sopuse also verbally educated on stair technique  Wheelchair Mobility    Modified Rankin (Stroke Patients Only)       Balance                                    Cognition Arousal/Alertness: Awake/alert Behavior During Therapy: WFL for tasks assessed/performed Overall Cognitive Status: Within Functional Limits for tasks assessed                      Exercises Total Joint Exercises Ankle Circles/Pumps: AROM;Both;15 reps Quad Sets: AROM;Both;15 reps Short Arc QuadSinclair Ship;Right;10 reps Heel Slides: AAROM;Right;10 reps Hip ABduction/ADduction: AAROM;Right;10 reps Straight Leg Raises: AAROM;Right;10 reps    General Comments        Pertinent Vitals/Pain Pain Assessment: 0-10 Pain Score: 3  Pain Location: R knee Pain Descriptors / Indicators: Aching Pain Intervention(s): Limited activity within patient's tolerance;Repositioned;Ice applied;Premedicated before session;Monitored during session    Home Living                      Prior Function            PT Goals (current goals can now be found in the care plan section) Progress towards PT goals: Progressing toward goals    Frequency  7X/week    PT Plan Current plan remains appropriate    Co-evaluation             End of Session   Activity Tolerance:  Patient tolerated treatment well Patient left: with call bell/phone within reach;with family/visitor present;in chair     Time: 0900-0930 PT Time Calculation (min): 30 min  Charges:  $Gait Training: 8-22 mins $Therapeutic Exercise: 8-22 mins                    G Codes:      Pearly Ap[place]ito,KATHrine E 2013-12-23, 2:04 PM Carmelia Bake, PT, DPT 12-23-13 Pager: [phone]

## 2013-12-13 NOTE — Progress Notes (Signed)
Pt d/ced home, husband here to take pt home.

## 2013-12-13 NOTE — Progress Notes (Signed)
   Subjective: 2 Days Post-Op Procedure(s) (LRB): RIGHT TOTAL KNEE ARTHROPLASTY (Right) Patient reports pain as mild.   Patient seen in rounds with Dr. Wynelle Link.  Voiding okay Patient is well, but has had some minor complaints of pain in the knee, requiring pain medications Patient is ready to go home today after therapy  Objective: Vital signs in last 24 hours: Temp:  [97.5 F (36.4 C)-98.6 F (37 C)] 98.6 F (37 C) (10/14 0508) Pulse Rate:  [80-111] 111 (10/14 0508) Resp:  [14-18] 16 (10/14 0508) BP: (98-126)/(46-68) 107/68 mmHg (10/14 0508) SpO2:  [97 %-100 %] 97 % (10/14 0508)  Intake/Output from previous day:  Intake/Output Summary (Last 24 hours) at 12/13/13 0749 Last data filed at 12/13/13 0340  Gross per 24 hour  Intake 1753.16 ml  Output   1000 ml  Net 753.16 ml     Labs:  Recent Labs  12/12/13 0435 12/13/13 0438  HGB 10.9* 9.8*    Recent Labs  12/12/13 0435 12/13/13 0438  WBC 7.9 8.2  RBC 3.22* 2.84*  HCT 32.7* 29.3*  PLT 167 163    Recent Labs  12/12/13 0435 12/13/13 0438  NA 135* 141  K 4.0 4.0  CL 103 109  CO2 24 22  BUN 10 6  CREATININE 0.79 0.76  GLUCOSE 135* 100*  CALCIUM 7.2* 7.9*   No results found for this basename: LABPT, INR,  in the last 72 hours  EXAM: General - Patient is Alert, Appropriate and Oriented Extremity - Neurovascular intact Sensation intact distally Incision - clean, dry, no drainage, healing, some bruising noted Motor Function - intact, moving foot and toes well on exam.   Assessment/Plan: 2 Days Post-Op Procedure(s) (LRB): RIGHT TOTAL KNEE ARTHROPLASTY (Right) Procedure(s) (LRB): RIGHT TOTAL KNEE ARTHROPLASTY (Right) Past Medical History  Diagnosis Date  . GERD (gastroesophageal reflux disease)   . IBS (irritable bowel syndrome)   . Mental disorder     depression  . Migraines   . Abnormal Pap smear     hx of colpo and cryo  . Thyroid disease   . Herpes   . Insomnia   . Depression   . Ulcer     . Arthritis     osteoarthritis  . Endometriosis   . Fibromyalgia     muscle spasms, joint pain triggered by stress  . History of blood transfusion 77    [place]  . Arthritis   . Osteopenia   . Meniscus tear     Right knee  . Heart murmur   . Hypothyroidism   . Anxiety    Principal Problem:   OA (osteoarthritis) of knee  Estimated body mass index is 22.3 kg/(m^2) as calculated from the following:   Height as of this encounter: 5\' 5"  (1.651 m).   Weight as of this encounter: 60.782 kg (134 lb). Up with therapy Discharge home with home health Diet - Regular diet Follow up - in 2 weeks Activity - WBAT Disposition - Home Condition Upon Discharge - Good D/C Meds - See DC Summary DVT Prophylaxis - Xarelto  Arlee Muslim, PA-C Orthopaedic Surgery 12/13/2013, 7:49 AM

## 2013-12-14 DIAGNOSIS — Z96651 Presence of right artificial knee joint: Secondary | ICD-10-CM | POA: Diagnosis not present

## 2013-12-14 DIAGNOSIS — Z471 Aftercare following joint replacement surgery: Secondary | ICD-10-CM | POA: Diagnosis not present

## 2013-12-14 DIAGNOSIS — M810 Age-related osteoporosis without current pathological fracture: Secondary | ICD-10-CM | POA: Diagnosis not present

## 2013-12-14 DIAGNOSIS — R269 Unspecified abnormalities of gait and mobility: Secondary | ICD-10-CM | POA: Diagnosis not present

## 2013-12-14 DIAGNOSIS — M797 Fibromyalgia: Secondary | ICD-10-CM | POA: Diagnosis not present

## 2013-12-18 ENCOUNTER — Telehealth: Payer: Self-pay | Admitting: Obstetrics and Gynecology

## 2013-12-18 NOTE — Telephone Encounter (Signed)
Left message to call [name] at [phone]. 

## 2013-12-18 NOTE — Telephone Encounter (Signed)
Spoke with patient. Patient states that she recently began to have vaginal itching and discharge. "I was seen earlier this year for it and given diflucan and nystatin cream." Patient has recently had knee surgery and declines appointment. Advised patient per practice protocol we are unable to treat with evaluation. Advised patient she can use OTC Monistat 3 or 7 with hydrocortisone ointment and oatmeal sitz baths for comfort. Patient states that she is unable to submerge leg in water. Advised with the Monistat and hydrocortisone ointment this will provide relief and treatment. Patient states that she is having some "vaginal bleeding from my clitoris." Advised patient that she really needs to be seen for evaluation so we are able to properly treat. Patient is agreeable. Appointment scheduled for tomorrow at 2:30pm with Regina Eck CNM. Patient agreeable to date and time.  Routing to Regina Eck CNM  Cc: Dr.Silva  Routing to provider for final review. Patient agreeable to disposition. Will close encounter

## 2013-12-18 NOTE — Telephone Encounter (Signed)
Pt having symptoms of a yeast infection and can't come in the office because she just had knee surgery.

## 2013-12-19 ENCOUNTER — Encounter: Payer: Self-pay | Admitting: Certified Nurse Midwife

## 2013-12-19 ENCOUNTER — Ambulatory Visit (INDEPENDENT_AMBULATORY_CARE_PROVIDER_SITE_OTHER): Payer: Medicare Other | Admitting: Certified Nurse Midwife

## 2013-12-19 VITALS — BP 108/62 | HR 64 | Resp 16 | Ht 65.0 in | Wt 141.0 lb

## 2013-12-19 DIAGNOSIS — B3731 Acute candidiasis of vulva and vagina: Secondary | ICD-10-CM

## 2013-12-19 DIAGNOSIS — B373 Candidiasis of vulva and vagina: Secondary | ICD-10-CM | POA: Diagnosis not present

## 2013-12-19 MED ORDER — TERCONAZOLE 0.4 % VA CREA: 1.0000 | TOPICAL_CREAM | Freq: Every day | VAGINAL | Status: DC

## 2013-12-19 NOTE — Patient Instructions (Signed)

## 2013-12-19 NOTE — Progress Notes (Signed)
63 y.o. Married g1p0010 here with complaint of vaginal symptoms of itching, burning, and increase discharge. Describes discharge as scant white discharge. Onset of symptoms 8 days ago. Denies new personal products or vaginal dryness. Patient is one week post right knee replacement and was on antibiotics, feels this may have contributed to symptoms. Not sexually active present due to surgery. Denies any urinary symptoms.  O:Healthy female WDWN Affect: normal, orientation x 3  Exam: Abdomen:soft,non tender Lymph node: no enlargement or tenderness Pelvic exam: External genital:slight increase in pink with slight exudate BUS: negative Vagina: white thick discharge noted. Ph:4.0   ,Wet prep taken Cervix: normal, non tender Uterus: normal, non tender Adnexa:normal, non tender, no masses or fullness noted   Wet Prep results: positive yeast  A: Yeast vaginitis Recent right knee replacement Vaginal dryness  P:Discussed findings of yeast vaginitis and etiology.  Rx: Terazol 7 vaginal cream with instructions  See order Discussed Coconut oil for vaginal dryness after completes medication.  Rv prn

## 2013-12-22 NOTE — Progress Notes (Signed)
Reviewed personally.  M. Suzanne Florence Yeung, MD.  

## 2013-12-29 DIAGNOSIS — M179 Osteoarthritis of knee, unspecified: Secondary | ICD-10-CM | POA: Diagnosis not present

## 2014-01-01 ENCOUNTER — Encounter: Payer: Self-pay | Admitting: Certified Nurse Midwife

## 2014-01-02 DIAGNOSIS — M1712 Unilateral primary osteoarthritis, left knee: Secondary | ICD-10-CM | POA: Diagnosis not present

## 2014-01-04 DIAGNOSIS — M1712 Unilateral primary osteoarthritis, left knee: Secondary | ICD-10-CM | POA: Diagnosis not present

## 2014-01-09 DIAGNOSIS — M1712 Unilateral primary osteoarthritis, left knee: Secondary | ICD-10-CM | POA: Diagnosis not present

## 2014-01-11 DIAGNOSIS — M1712 Unilateral primary osteoarthritis, left knee: Secondary | ICD-10-CM | POA: Diagnosis not present

## 2014-01-16 DIAGNOSIS — M179 Osteoarthritis of knee, unspecified: Secondary | ICD-10-CM | POA: Diagnosis not present

## 2014-01-16 DIAGNOSIS — Z96651 Presence of right artificial knee joint: Secondary | ICD-10-CM | POA: Diagnosis not present

## 2014-01-16 DIAGNOSIS — Z471 Aftercare following joint replacement surgery: Secondary | ICD-10-CM | POA: Diagnosis not present

## 2014-01-17 DIAGNOSIS — M7072 Other bursitis of hip, left hip: Secondary | ICD-10-CM | POA: Diagnosis not present

## 2014-01-17 DIAGNOSIS — M7071 Other bursitis of hip, right hip: Secondary | ICD-10-CM | POA: Diagnosis not present

## 2014-01-18 DIAGNOSIS — M179 Osteoarthritis of knee, unspecified: Secondary | ICD-10-CM | POA: Diagnosis not present

## 2014-01-22 DIAGNOSIS — M179 Osteoarthritis of knee, unspecified: Secondary | ICD-10-CM | POA: Diagnosis not present

## 2014-01-24 DIAGNOSIS — M1711 Unilateral primary osteoarthritis, right knee: Secondary | ICD-10-CM | POA: Diagnosis not present

## 2014-01-29 DIAGNOSIS — F4323 Adjustment disorder with mixed anxiety and depressed mood: Secondary | ICD-10-CM | POA: Diagnosis not present

## 2014-02-07 DIAGNOSIS — F4323 Adjustment disorder with mixed anxiety and depressed mood: Secondary | ICD-10-CM | POA: Diagnosis not present

## 2014-02-08 DIAGNOSIS — M25562 Pain in left knee: Secondary | ICD-10-CM | POA: Diagnosis not present

## 2014-02-10 DIAGNOSIS — F4323 Adjustment disorder with mixed anxiety and depressed mood: Secondary | ICD-10-CM | POA: Diagnosis not present

## 2014-02-17 DIAGNOSIS — F4323 Adjustment disorder with mixed anxiety and depressed mood: Secondary | ICD-10-CM | POA: Diagnosis not present

## 2014-02-20 DIAGNOSIS — F4323 Adjustment disorder with mixed anxiety and depressed mood: Secondary | ICD-10-CM | POA: Diagnosis not present

## 2014-02-26 ENCOUNTER — Telehealth: Payer: Self-pay | Admitting: Obstetrics and Gynecology

## 2014-02-26 NOTE — Telephone Encounter (Signed)
Spoke with [employer]. Form for PA for Estradiol tablet and cream have been faxed to office. Will await fax for completion before review and signature from provider.

## 2014-02-26 NOTE — Telephone Encounter (Signed)
[employer] told pt she needs prior auth for her estridol tablet and estridol cream. They told her it can be approved for 3 months.  Ph:[phone]  [email or web address]

## 2014-03-02 DIAGNOSIS — M810 Age-related osteoporosis without current pathological fracture: Secondary | ICD-10-CM

## 2014-03-02 HISTORY — DX: Age-related osteoporosis without current pathological fracture: M81.0

## 2014-03-05 NOTE — Telephone Encounter (Signed)
Spoke with [employer] to check on forms. Advised Estradiol 0.1mg  cream does not require PA but Estradiol 0.5mg  tablet does. Awaiting PA form for Estradiol 0.5mg  tablet.

## 2014-03-06 DIAGNOSIS — G43019 Migraine without aura, intractable, without status migrainosus: Secondary | ICD-10-CM | POA: Diagnosis not present

## 2014-03-06 DIAGNOSIS — G43719 Chronic migraine without aura, intractable, without status migrainosus: Secondary | ICD-10-CM | POA: Diagnosis not present

## 2014-03-07 DIAGNOSIS — F4323 Adjustment disorder with mixed anxiety and depressed mood: Secondary | ICD-10-CM | POA: Diagnosis not present

## 2014-03-07 NOTE — Telephone Encounter (Signed)
I will be happy to review and sign the PA form.

## 2014-03-07 NOTE — Telephone Encounter (Signed)
PA to Dr.Silva for review and signature before fax.

## 2014-03-12 NOTE — Telephone Encounter (Signed)
Prior authorization faxed to [facility] at [phone] with cover sheet and confirmation.  Routing to provider for final review. Patient agreeable to disposition. Will close encounter

## 2014-03-14 NOTE — Telephone Encounter (Signed)
Spoke with pharmacist at [facility] who states Estradiol 0.5mg  tablet has been approved. Will fax over approval form to office.  Encounter previously closed.

## 2014-03-19 DIAGNOSIS — F4323 Adjustment disorder with mixed anxiety and depressed mood: Secondary | ICD-10-CM | POA: Diagnosis not present

## 2014-03-20 DIAGNOSIS — K5900 Constipation, unspecified: Secondary | ICD-10-CM | POA: Diagnosis not present

## 2014-03-20 DIAGNOSIS — K625 Hemorrhage of anus and rectum: Secondary | ICD-10-CM | POA: Diagnosis not present

## 2014-03-20 DIAGNOSIS — K219 Gastro-esophageal reflux disease without esophagitis: Secondary | ICD-10-CM | POA: Diagnosis not present

## 2014-03-25 ENCOUNTER — Other Ambulatory Visit: Payer: Self-pay | Admitting: Obstetrics and Gynecology

## 2014-03-26 NOTE — Telephone Encounter (Signed)
Medication refill request: Estrace Vaginal Cream Last AEX:  04/03/13 Next AEX: 04/06/14  Last MMG (if hormonal medication request): 04/28/13 [id number]:Neg Refill authorized: 10/27/13 # 42.5g/2R. Today ?

## 2014-03-29 ENCOUNTER — Other Ambulatory Visit: Payer: Self-pay | Admitting: Obstetrics & Gynecology

## 2014-03-29 DIAGNOSIS — Z96651 Presence of right artificial knee joint: Secondary | ICD-10-CM | POA: Diagnosis not present

## 2014-03-29 DIAGNOSIS — Z471 Aftercare following joint replacement surgery: Secondary | ICD-10-CM | POA: Diagnosis not present

## 2014-04-06 ENCOUNTER — Encounter: Payer: Self-pay | Admitting: Obstetrics and Gynecology

## 2014-04-06 ENCOUNTER — Ambulatory Visit (INDEPENDENT_AMBULATORY_CARE_PROVIDER_SITE_OTHER): Payer: Medicare Other | Admitting: Obstetrics and Gynecology

## 2014-04-06 VITALS — BP 110/60 | HR 100 | Resp 20 | Ht 64.5 in | Wt 123.2 lb

## 2014-04-06 DIAGNOSIS — Z01419 Encounter for gynecological examination (general) (routine) without abnormal findings: Secondary | ICD-10-CM | POA: Diagnosis not present

## 2014-04-06 DIAGNOSIS — Z124 Encounter for screening for malignant neoplasm of cervix: Secondary | ICD-10-CM

## 2014-04-06 MED ORDER — ESTRADIOL 0.1 MG/GM VA CREA: TOPICAL_CREAM | VAGINAL | Status: DC

## 2014-04-06 MED ORDER — ESTRADIOL 0.5 MG PO TABS: 0.5000 mg | ORAL_TABLET | Freq: Every day | ORAL | Status: DC

## 2014-04-06 MED ORDER — VALACYCLOVIR HCL 500 MG PO TABS: 500.0000 mg | ORAL_TABLET | Freq: Every day | ORAL | Status: DC

## 2014-04-06 NOTE — Patient Instructions (Signed)

## 2014-04-06 NOTE — Progress Notes (Signed)
Patient ID: [name], female   DOB: 1950/05/24, 64 y.o.   MRN: [id number] 64 y.o. G1P0010 MarriedCaucasianF here for annual exam.   PCP: Berneta Sages, MD  Using Estradiol orally and cream.  Wants to continue.  Working well.   Status post right knee replacement.   Taking Prolia every 6 months through PCP for osteopenia.   Got a new puppy.   Patient's last menstrual period was 03/02/1988.          Sexually active: Yes.   female partner The current method of family planning is status post hysterectomy.    Exercising: Yes.    walking. Smoker:  no  Health Maintenance: Pap:  2010 wnl History of abnormal Pap:  Yes,30 years ago had colposcopy and cryotherapy to cervix.  Paps normal since. MMG:  04-28-13 heterogeneously dense/nl:[facility] Colonoscopy:  2014 polyps with Dr. Collene Mares :repeat 2019(fam.hx colon ca-dad) BMD:   2014 osteopenia with Dr. Dagmar Hait TDaP:  2013 Screening Labs: Hb today: PCP, Urine today: PCP   reports that she has never smoked. She has never used smokeless tobacco. She reports that she drinks about 7.2 - 8.4 oz of alcohol per week. She reports that she does not use illicit drugs.  Past Medical History  Diagnosis Date  . GERD (gastroesophageal reflux disease)   . IBS (irritable bowel syndrome)   . Mental disorder     depression  . Migraines   . Abnormal Pap smear     hx of colpo and cryo  . Thyroid disease   . Herpes   . Insomnia   . Depression   . Ulcer   . Arthritis     osteoarthritis  . Endometriosis   . Fibromyalgia     muscle spasms, joint pain triggered by stress  . History of blood transfusion 77    [place]  . Arthritis   . Osteopenia   . Meniscus tear     Right knee  . Heart murmur   . Hypothyroidism   . Anxiety     Past Surgical History  Procedure Laterality Date  . Cervical fusion  2002    x2  . Elbow surgery    . Tonsilectomy, adenoidectomy, bilateral myringotomy and tubes    . Appendectomy      age 73  . Laparoscopy      age 64  .  Cataract extraction Bilateral 2012  . Cholecystectomy  2003  . Abdominal hysterectomy  1990  . Bartholin cyst marsupialization Right 07/05/2012    Procedure: BARTHOLIN CYST MARSUPIALIZATION;  Surgeon: Arloa Koh, MD;  Location: [facility];  Service: Gynecology;  Laterality: Right;  Excision of right Bartholin Gland  . Knee surgery Right 07/2012    menicus tear repair  . Gynecologic cryosurgery    . Colposcopy w/ biopsy / curettage      30 years ago  . Bunionectomy      left foot   . Total knee arthroplasty Right 12/11/2013    Procedure: RIGHT TOTAL KNEE ARTHROPLASTY;  Surgeon: Gearlean Alf, MD;  Location: [facility];  Service: Orthopedics;  Laterality: Right;    Current Outpatient Prescriptions  Medication Sig Dispense Refill  . denosumab (PROLIA) 60 MG/ML SOLN injection Inject 60 mg into the skin every 6 (six) months. Administer in upper arm, thigh, or abdomen    . acetaminophen (TYLENOL) 500 MG tablet Take 1,000 mg by mouth once as needed for mild pain or headache.    . ARIPiprazole (ABILIFY) 5 MG tablet  Take 2.5 mg by mouth at bedtime.    . cyclobenzaprine (FLEXERIL) 10 MG tablet Take 10 mg by mouth at bedtime.     Marland Kitchen dexlansoprazole (DEXILANT) 60 MG capsule Take 60 mg by mouth every morning. Takes with a glass of water.    [name] Calcium (STOOL SOFTENER PO) Take 3 tablets by mouth 2 (two) times daily.    . DULoxetine (CYMBALTA) 60 MG capsule Take 60 mg by mouth every morning.     Marland Kitchen estradiol (ESTRACE VAGINAL) 0.1 MG/GM vaginal cream USE 1/2 GRAM VAGINALLY AT NIGHT FOR 2 WEEKS THEN USE 1/2 GRAM 2-3 TIMES WEEKLY AS NEEDED 42.5 g 2  . estradiol (ESTRACE) 0.5 MG tablet Take 1 tablet (0.5 mg total) by mouth daily. 90 tablet 3  . levothyroxine (SYNTHROID, LEVOTHROID) 125 MCG tablet Take 125 mcg by mouth daily before breakfast. One hour before meal.    . Linaclotide (LINZESS) 290 MCG CAPS Take 1 capsule by mouth every morning. Takes with a glass of water.    Marland Kitchen LORazepam (ATIVAN) 1 MG tablet  Take 2 mg by mouth at bedtime.    Marland Kitchen MAGNESIUM PO Take 3-4 tablets by mouth 2 (two) times daily. 3 tablets in the am and 4 tablets at night    . methocarbamol (ROBAXIN) 500 MG tablet Take 1 tablet (500 mg total) by mouth every 8 (eight) hours as needed for muscle spasms. 80 tablet 0  . OVER THE COUNTER MEDICATION Eye health capsule daily- last dose on 11/28/2013    . Polyethyl Glycol-Propyl Glycol (SYSTANE OP) Place 1 drop into both eyes 2 (two) times daily.    . SUMAtriptan (IMITREX) 6 MG/0.5ML SOLN injection Inject 6 mg into the skin every 2 (two) hours as needed for migraine or headache. F    . terconazole (TERAZOL 7) 0.4 % vaginal cream Place 1 applicator vaginally at bedtime. 45 g 0  . topiramate (TOPAMAX) 50 MG tablet Take 150 mg by mouth at bedtime.     . valACYclovir (VALTREX) 500 MG tablet Take 1 tablet (500 mg total) by mouth daily. 90 tablet 3   No current facility-administered medications for this visit.    Family History  Problem Relation Age of Onset  . Colon cancer Father   . Heart disease Father   . Kidney failure Father   . Hypertension Father   . Alcohol abuse Father   . Stroke Mother   . Osteoporosis Mother   . Rheum arthritis Mother   . Dementia Mother   . Hypertension Mother   . Anxiety disorder Brother   . Insomnia Brother   . Depression Brother   . Alcohol abuse Brother     ROS:  Pertinent items are noted in HPI.  Otherwise, a comprehensive ROS was negative.  Exam:   BP 110/60 mmHg  Pulse 100  Resp 20  Ht 5' 4.5" (1.638 m)  Wt 123 lb 3.2 oz (55.883 kg)  BMI 20.83 kg/m2  LMP 03/02/1988      Height: 5' 4.5" (163.8 cm)  Ht Readings from Last 3 Encounters:  04/06/14 5' 4.5" (1.638 m)  12/19/13 5\' 5"  (1.651 m)  12/11/13 5\' 5"  (1.651 m)    General appearance: alert, cooperative and appears stated age Head: Normocephalic, without obvious abnormality, atraumatic Neck: no adenopathy, supple, symmetrical, trachea midline and thyroid normal to inspection and  palpation Lungs: clear to auscultation bilaterally Breasts: normal appearance, no masses or tenderness, Inspection negative, No nipple retraction or dimpling, No nipple discharge or  bleeding, No axillary or supraclavicular adenopathy Heart: regular rate and rhythm Abdomen: soft, non-tender; bowel sounds normal; no masses,  no organomegaly Extremities: extremities normal, atraumatic, no cyanosis or edema Skin: Skin color, texture, turgor normal. No rashes or lesions Lymph nodes: Cervical, supraclavicular, and axillary nodes normal. No abnormal inguinal nodes palpated Neurologic: Grossly normal   Pelvic: External genitalia:  no lesions              Urethra:  normal appearing urethra with no masses, tenderness or lesions              Bartholins and Skenes: normal                 Vagina: normal appearing vagina with normal color and discharge, no lesions              Cervix: absent              Pap taken: No. Bimanual Exam:  Uterus:  normal size, contour, position, consistency, mobility, non-tender              Adnexa: normal adnexa and no mass, fullness, tenderness               Rectovaginal: Confirms               Anus:  normal sphincter tone, no lesions  Chaperone was present for exam.  A:  Well Woman with normal exam Status post TAH/BSO.  On ERT and vaginal Estrace cream.  Osteopenia.  On Prolia.  P:   Mammogram yearly.  pap smear not indicated.  Continue Estrace 0.5 mg daily and Estrace cream 1/2 gm pv at hs 2 times weekly.  Risks of breast cancer, DVT, PE, MI,and stroke reviewed.  Prolia through PCP.  return annually or prn

## 2014-04-09 ENCOUNTER — Telehealth: Payer: Self-pay | Admitting: Obstetrics and Gynecology

## 2014-04-09 NOTE — Telephone Encounter (Signed)
Spoke with patient. Patient says that she has checked into costs of Estrace cream. "It is a tier 4 drug. I want to know what the comparison is with Vagifem tablets. They would be cheaper for me." Patient is requesting Dr.Silva's advice on whether or not she feels this change would work well for her. Advised would send Dr.Silva a message and return call with further information. Patient is agreeable.

## 2014-04-09 NOTE — Telephone Encounter (Signed)
Left message to call Kaitlyn at [phone]. 

## 2014-04-09 NOTE — Telephone Encounter (Signed)
Patient calling stating her estradiol cream needs prior-authorization.  [facility]

## 2014-04-09 NOTE — Telephone Encounter (Signed)
Patient calling to speak with nurse about medication. 

## 2014-04-09 NOTE — Telephone Encounter (Signed)
Spoke with patient. Advised that she has a prior authorization for estradiol tablets in place until 03/14/2015. Advised patient no prior authorization is required for estrace cream. Patient has concerns regarding drug cost for Estrace Cream. Advised patient if has questions regarding cost of cream, should direct to [employer]. Patient will find out which cream is covered on this formulary. Will call back if needs a different prescription for cream. Routing to provider for final review. Patient agreeable to disposition. Will close encounter

## 2014-04-10 MED ORDER — ESTRADIOL 10 MCG VA TABS: 1.0000 | ORAL_TABLET | VAGINAL | Status: DC

## 2014-04-10 NOTE — Telephone Encounter (Signed)
Spoke with patient. Advised patient of message as seen below from [place]. Patient is agreeable. Patient would like to try Vagifem at this time. [id number]. Patient is agreeable and verbalizes understanding. "This is still safe to take with the oral estrogen right?" Advised that it is safe to take with oral estrogen. Patient is agreeable.  Routing to provider for final review. Patient agreeable to disposition. Will close encounter

## 2014-04-10 NOTE — Telephone Encounter (Signed)
I am happy to prescribe the Vagifem for the patient.  It is very comparable to the vaginal estrogen creams and is less messy.  The only real difference is that she cannot apply it to the external genital areas the way the cream can also be used. Please place an order if the patient would like the Vagifem and I will refill for one year.

## 2014-04-10 NOTE — Telephone Encounter (Signed)
Left message to call Romanda Turrubiates at [phone]. 

## 2014-04-11 DIAGNOSIS — M4726 Other spondylosis with radiculopathy, lumbar region: Secondary | ICD-10-CM | POA: Diagnosis not present

## 2014-04-11 DIAGNOSIS — M4806 Spinal stenosis, lumbar region: Secondary | ICD-10-CM | POA: Diagnosis not present

## 2014-04-11 DIAGNOSIS — M5136 Other intervertebral disc degeneration, lumbar region: Secondary | ICD-10-CM | POA: Diagnosis not present

## 2014-04-11 DIAGNOSIS — R202 Paresthesia of skin: Secondary | ICD-10-CM | POA: Diagnosis not present

## 2014-04-11 DIAGNOSIS — R200 Anesthesia of skin: Secondary | ICD-10-CM | POA: Diagnosis not present

## 2014-04-23 DIAGNOSIS — G5731 Lesion of lateral popliteal nerve, right lower limb: Secondary | ICD-10-CM | POA: Diagnosis not present

## 2014-04-26 DIAGNOSIS — M7071 Other bursitis of hip, right hip: Secondary | ICD-10-CM | POA: Diagnosis not present

## 2014-04-26 DIAGNOSIS — R200 Anesthesia of skin: Secondary | ICD-10-CM | POA: Diagnosis not present

## 2014-04-26 DIAGNOSIS — M797 Fibromyalgia: Secondary | ICD-10-CM | POA: Diagnosis not present

## 2014-04-26 DIAGNOSIS — M25562 Pain in left knee: Secondary | ICD-10-CM | POA: Diagnosis not present

## 2014-04-26 DIAGNOSIS — M542 Cervicalgia: Secondary | ICD-10-CM | POA: Diagnosis not present

## 2014-05-02 DIAGNOSIS — Z79899 Other long term (current) drug therapy: Secondary | ICD-10-CM | POA: Diagnosis not present

## 2014-05-02 DIAGNOSIS — Z23 Encounter for immunization: Secondary | ICD-10-CM | POA: Diagnosis not present

## 2014-05-02 DIAGNOSIS — M859 Disorder of bone density and structure, unspecified: Secondary | ICD-10-CM | POA: Diagnosis not present

## 2014-05-18 DIAGNOSIS — Z79899 Other long term (current) drug therapy: Secondary | ICD-10-CM | POA: Diagnosis not present

## 2014-05-18 DIAGNOSIS — M2550 Pain in unspecified joint: Secondary | ICD-10-CM | POA: Diagnosis not present

## 2014-05-18 DIAGNOSIS — R5381 Other malaise: Secondary | ICD-10-CM | POA: Diagnosis not present

## 2014-05-24 DIAGNOSIS — M5431 Sciatica, right side: Secondary | ICD-10-CM | POA: Diagnosis not present

## 2014-05-24 DIAGNOSIS — Z471 Aftercare following joint replacement surgery: Secondary | ICD-10-CM | POA: Diagnosis not present

## 2014-05-24 DIAGNOSIS — Z96651 Presence of right artificial knee joint: Secondary | ICD-10-CM | POA: Diagnosis not present

## 2014-05-25 DIAGNOSIS — M5125 Other intervertebral disc displacement, thoracolumbar region: Secondary | ICD-10-CM | POA: Diagnosis not present

## 2014-05-25 DIAGNOSIS — M4806 Spinal stenosis, lumbar region: Secondary | ICD-10-CM | POA: Diagnosis not present

## 2014-05-29 DIAGNOSIS — M4726 Other spondylosis with radiculopathy, lumbar region: Secondary | ICD-10-CM | POA: Diagnosis not present

## 2014-05-29 DIAGNOSIS — M546 Pain in thoracic spine: Secondary | ICD-10-CM | POA: Diagnosis not present

## 2014-05-29 DIAGNOSIS — M545 Low back pain: Secondary | ICD-10-CM | POA: Diagnosis not present

## 2014-05-29 DIAGNOSIS — M5136 Other intervertebral disc degeneration, lumbar region: Secondary | ICD-10-CM | POA: Diagnosis not present

## 2014-05-30 ENCOUNTER — Other Ambulatory Visit (HOSPITAL_COMMUNITY): Payer: Self-pay | Admitting: Internal Medicine

## 2014-05-30 ENCOUNTER — Encounter (HOSPITAL_COMMUNITY)
Admission: RE | Admit: 2014-05-30 | Discharge: 2014-05-30 | Disposition: A | Discharge status: Home / Self Care | Payer: Medicare Other | Source: Ambulatory Visit | Attending: Internal Medicine | Admitting: Internal Medicine

## 2014-05-30 ENCOUNTER — Encounter (HOSPITAL_COMMUNITY): Payer: Self-pay

## 2014-05-30 DIAGNOSIS — M810 Age-related osteoporosis without current pathological fracture: Secondary | ICD-10-CM | POA: Insufficient documentation

## 2014-05-30 DIAGNOSIS — Z1231 Encounter for screening mammogram for malignant neoplasm of breast: Secondary | ICD-10-CM | POA: Diagnosis not present

## 2014-05-30 MED ORDER — DENOSUMAB 60 MG/ML ~~LOC~~ SOLN
60.0000 mg | Freq: Once | SUBCUTANEOUS | Status: AC
  Administered 2014-05-30: 60 mg via SUBCUTANEOUS
  Filled 2014-05-30: qty 1

## 2014-05-30 NOTE — Discharge Instructions (Signed)
Denosumab injection What is this medicine? DENOSUMAB (den oh sue mab) slows bone breakdown. Prolia is used to treat osteoporosis in women after menopause and in men. Xgeva is used to prevent bone fractures and other bone problems caused by cancer bone metastases. Xgeva is also used to treat giant cell tumor of the bone. This medicine may be used for other purposes; ask your health care provider or pharmacist if you have questions. COMMON BRAND NAME(S): Prolia, XGEVA What should I tell my health care provider before I take this medicine? They need to know if you have any of these conditions: -dental disease -eczema -infection or history of infections -kidney disease or on dialysis -low blood calcium or vitamin D -malabsorption syndrome -scheduled to have surgery or tooth extraction -taking medicine that contains denosumab -thyroid or parathyroid disease -an unusual reaction to denosumab, other medicines, foods, dyes, or preservatives -pregnant or trying to get pregnant -breast-feeding How should I use this medicine? This medicine is for injection under the skin. It is given by a health care professional in a hospital or clinic setting. If you are getting Prolia, a special MedGuide will be given to you by the pharmacist with each prescription and refill. Be sure to read this information carefully each time. For Prolia, talk to your pediatrician regarding the use of this medicine in children. Special care may be needed. For Xgeva, talk to your pediatrician regarding the use of this medicine in children. While this drug may be prescribed for children as young as 13 years for selected conditions, precautions do apply. Overdosage: If you think you've taken too much of this medicine contact a poison control center or emergency room at once. Overdosage: If you think you have taken too much of this medicine contact a poison control center or emergency room at once. NOTE: This medicine is only for  you. Do not share this medicine with others. What if I miss a dose? It is important not to miss your dose. Call your doctor or health care professional if you are unable to keep an appointment. What may interact with this medicine? Do not take this medicine with any of the following medications: -other medicines containing denosumab This medicine may also interact with the following medications: -medicines that suppress the immune system -medicines that treat cancer -steroid medicines like prednisone or cortisone This list may not describe all possible interactions. Give your health care provider a list of all the medicines, herbs, non-prescription drugs, or dietary supplements you use. Also tell them if you smoke, drink alcohol, or use illegal drugs. Some items may interact with your medicine. What should I watch for while using this medicine? Visit your doctor or health care professional for regular checks on your progress. Your doctor or health care professional may order blood tests and other tests to see how you are doing. Call your doctor or health care professional if you get a cold or other infection while receiving this medicine. Do not treat yourself. This medicine may decrease your body's ability to fight infection. You should make sure you get enough calcium and vitamin D while you are taking this medicine, unless your doctor tells you not to. Discuss the foods you eat and the vitamins you take with your health care professional. See your dentist regularly. Brush and floss your teeth as directed. Before you have any dental work done, tell your dentist you are receiving this medicine. Do not become pregnant while taking this medicine or for 5 months after stopping   it. Women should inform their doctor if they wish to become pregnant or think they might be pregnant. There is a potential for serious side effects to an unborn child. Talk to your health care professional or pharmacist for more  information. What side effects may I notice from receiving this medicine? Side effects that you should report to your doctor or health care professional as soon as possible: -allergic reactions like skin rash, itching or hives, swelling of the face, lips, or tongue -breathing problems -chest pain -fast, irregular heartbeat -feeling faint or lightheaded, falls -fever, chills, or any other sign of infection -muscle spasms, tightening, or twitches -numbness or tingling -skin blisters or bumps, or is dry, peels, or red -slow healing or unexplained pain in the mouth or jaw -unusual bleeding or bruising Side effects that usually do not require medical attention (Report these to your doctor or health care professional if they continue or are bothersome.): -muscle pain -stomach upset, gas This list may not describe all possible side effects. Call your doctor for medical advice about side effects. You may report side effects to FDA at 1-800-FDA-1088. Where should I keep my medicine? This medicine is only given in a clinic, doctor's office, or other health care setting and will not be stored at home. NOTE: This sheet is a summary. It may not cover all possible information. If you have questions about this medicine, talk to your doctor, pharmacist, or health care provider.  2015, Elsevier/Gold Standard. (2011-08-17 12:37:47)  

## 2014-07-03 DIAGNOSIS — E039 Hypothyroidism, unspecified: Secondary | ICD-10-CM | POA: Diagnosis not present

## 2014-07-03 DIAGNOSIS — E785 Hyperlipidemia, unspecified: Secondary | ICD-10-CM | POA: Diagnosis not present

## 2014-07-03 DIAGNOSIS — D539 Nutritional anemia, unspecified: Secondary | ICD-10-CM | POA: Diagnosis not present

## 2014-07-03 DIAGNOSIS — M859 Disorder of bone density and structure, unspecified: Secondary | ICD-10-CM | POA: Diagnosis not present

## 2014-07-05 DIAGNOSIS — M1712 Unilateral primary osteoarthritis, left knee: Secondary | ICD-10-CM | POA: Diagnosis not present

## 2014-07-09 DIAGNOSIS — Z1212 Encounter for screening for malignant neoplasm of rectum: Secondary | ICD-10-CM | POA: Diagnosis not present

## 2014-07-10 DIAGNOSIS — Z1389 Encounter for screening for other disorder: Secondary | ICD-10-CM | POA: Diagnosis not present

## 2014-07-10 DIAGNOSIS — M1990 Unspecified osteoarthritis, unspecified site: Secondary | ICD-10-CM | POA: Diagnosis not present

## 2014-07-10 DIAGNOSIS — Z6820 Body mass index (BMI) 20.0-20.9, adult: Secondary | ICD-10-CM | POA: Diagnosis not present

## 2014-07-10 DIAGNOSIS — F329 Major depressive disorder, single episode, unspecified: Secondary | ICD-10-CM | POA: Diagnosis not present

## 2014-07-10 DIAGNOSIS — Z0000 Encounter for general adult medical examination without abnormal findings: Secondary | ICD-10-CM | POA: Diagnosis not present

## 2014-07-10 DIAGNOSIS — E039 Hypothyroidism, unspecified: Secondary | ICD-10-CM | POA: Diagnosis not present

## 2014-07-10 DIAGNOSIS — M5380 Other specified dorsopathies, site unspecified: Secondary | ICD-10-CM | POA: Diagnosis not present

## 2014-07-10 DIAGNOSIS — K5900 Constipation, unspecified: Secondary | ICD-10-CM | POA: Diagnosis not present

## 2014-07-10 DIAGNOSIS — E785 Hyperlipidemia, unspecified: Secondary | ICD-10-CM | POA: Diagnosis not present

## 2014-07-10 DIAGNOSIS — M859 Disorder of bone density and structure, unspecified: Secondary | ICD-10-CM | POA: Diagnosis not present

## 2014-07-10 DIAGNOSIS — K635 Polyp of colon: Secondary | ICD-10-CM | POA: Diagnosis not present

## 2014-07-10 DIAGNOSIS — M797 Fibromyalgia: Secondary | ICD-10-CM | POA: Diagnosis not present

## 2014-07-12 DIAGNOSIS — M1712 Unilateral primary osteoarthritis, left knee: Secondary | ICD-10-CM | POA: Diagnosis not present

## 2014-07-20 DIAGNOSIS — M1712 Unilateral primary osteoarthritis, left knee: Secondary | ICD-10-CM | POA: Diagnosis not present

## 2014-07-27 DIAGNOSIS — M1712 Unilateral primary osteoarthritis, left knee: Secondary | ICD-10-CM | POA: Diagnosis not present

## 2014-08-03 DIAGNOSIS — M1712 Unilateral primary osteoarthritis, left knee: Secondary | ICD-10-CM | POA: Diagnosis not present

## 2014-08-13 DIAGNOSIS — M25512 Pain in left shoulder: Secondary | ICD-10-CM | POA: Diagnosis not present

## 2014-08-13 DIAGNOSIS — M7711 Lateral epicondylitis, right elbow: Secondary | ICD-10-CM | POA: Diagnosis not present

## 2014-08-13 DIAGNOSIS — Z4789 Encounter for other orthopedic aftercare: Secondary | ICD-10-CM | POA: Diagnosis not present

## 2014-08-13 DIAGNOSIS — M25521 Pain in right elbow: Secondary | ICD-10-CM | POA: Diagnosis not present

## 2014-08-13 DIAGNOSIS — M7502 Adhesive capsulitis of left shoulder: Secondary | ICD-10-CM | POA: Diagnosis not present

## 2014-08-23 DIAGNOSIS — M7502 Adhesive capsulitis of left shoulder: Secondary | ICD-10-CM | POA: Diagnosis not present

## 2014-08-24 DIAGNOSIS — Z471 Aftercare following joint replacement surgery: Secondary | ICD-10-CM | POA: Diagnosis not present

## 2014-08-24 DIAGNOSIS — Z96651 Presence of right artificial knee joint: Secondary | ICD-10-CM | POA: Diagnosis not present

## 2014-08-29 DIAGNOSIS — M7502 Adhesive capsulitis of left shoulder: Secondary | ICD-10-CM | POA: Diagnosis not present

## 2014-08-31 DIAGNOSIS — M7502 Adhesive capsulitis of left shoulder: Secondary | ICD-10-CM | POA: Diagnosis not present

## 2014-09-06 DIAGNOSIS — M7502 Adhesive capsulitis of left shoulder: Secondary | ICD-10-CM | POA: Diagnosis not present

## 2014-09-10 DIAGNOSIS — G8929 Other chronic pain: Secondary | ICD-10-CM | POA: Diagnosis not present

## 2014-09-10 DIAGNOSIS — G5621 Lesion of ulnar nerve, right upper limb: Secondary | ICD-10-CM | POA: Diagnosis not present

## 2014-09-10 DIAGNOSIS — M7502 Adhesive capsulitis of left shoulder: Secondary | ICD-10-CM | POA: Diagnosis not present

## 2014-09-10 DIAGNOSIS — M25521 Pain in right elbow: Secondary | ICD-10-CM | POA: Diagnosis not present

## 2014-09-12 DIAGNOSIS — M7502 Adhesive capsulitis of left shoulder: Secondary | ICD-10-CM | POA: Diagnosis not present

## 2014-09-14 DIAGNOSIS — M7502 Adhesive capsulitis of left shoulder: Secondary | ICD-10-CM | POA: Diagnosis not present

## 2014-09-15 DIAGNOSIS — G5622 Lesion of ulnar nerve, left upper limb: Secondary | ICD-10-CM | POA: Diagnosis not present

## 2014-09-21 DIAGNOSIS — M7502 Adhesive capsulitis of left shoulder: Secondary | ICD-10-CM | POA: Diagnosis not present

## 2014-09-28 DIAGNOSIS — Z9889 Other specified postprocedural states: Secondary | ICD-10-CM | POA: Diagnosis not present

## 2014-09-28 DIAGNOSIS — G5622 Lesion of ulnar nerve, left upper limb: Secondary | ICD-10-CM | POA: Diagnosis not present

## 2014-09-28 DIAGNOSIS — M542 Cervicalgia: Secondary | ICD-10-CM | POA: Diagnosis not present

## 2014-09-28 DIAGNOSIS — M7502 Adhesive capsulitis of left shoulder: Secondary | ICD-10-CM | POA: Diagnosis not present

## 2014-10-10 DIAGNOSIS — M7502 Adhesive capsulitis of left shoulder: Secondary | ICD-10-CM | POA: Diagnosis not present

## 2014-10-15 DIAGNOSIS — M7502 Adhesive capsulitis of left shoulder: Secondary | ICD-10-CM | POA: Diagnosis not present

## 2014-10-18 DIAGNOSIS — M7502 Adhesive capsulitis of left shoulder: Secondary | ICD-10-CM | POA: Diagnosis not present

## 2014-10-23 DIAGNOSIS — F410 Panic disorder [episodic paroxysmal anxiety] without agoraphobia: Secondary | ICD-10-CM | POA: Diagnosis not present

## 2014-10-23 DIAGNOSIS — D539 Nutritional anemia, unspecified: Secondary | ICD-10-CM | POA: Diagnosis not present

## 2014-10-23 DIAGNOSIS — K219 Gastro-esophageal reflux disease without esophagitis: Secondary | ICD-10-CM | POA: Diagnosis not present

## 2014-10-23 DIAGNOSIS — Z6821 Body mass index (BMI) 21.0-21.9, adult: Secondary | ICD-10-CM | POA: Diagnosis not present

## 2014-10-23 DIAGNOSIS — E039 Hypothyroidism, unspecified: Secondary | ICD-10-CM | POA: Diagnosis not present

## 2014-10-23 DIAGNOSIS — R0789 Other chest pain: Secondary | ICD-10-CM | POA: Diagnosis not present

## 2014-10-23 DIAGNOSIS — R0600 Dyspnea, unspecified: Secondary | ICD-10-CM | POA: Diagnosis not present

## 2014-10-23 DIAGNOSIS — R42 Dizziness and giddiness: Secondary | ICD-10-CM | POA: Diagnosis not present

## 2014-10-23 DIAGNOSIS — R209 Unspecified disturbances of skin sensation: Secondary | ICD-10-CM | POA: Diagnosis not present

## 2014-10-24 DIAGNOSIS — R0789 Other chest pain: Secondary | ICD-10-CM | POA: Diagnosis not present

## 2014-10-24 DIAGNOSIS — R0600 Dyspnea, unspecified: Secondary | ICD-10-CM | POA: Diagnosis not present

## 2014-10-30 DIAGNOSIS — M170 Bilateral primary osteoarthritis of knee: Secondary | ICD-10-CM | POA: Diagnosis not present

## 2014-10-30 DIAGNOSIS — M19041 Primary osteoarthritis, right hand: Secondary | ICD-10-CM | POA: Diagnosis not present

## 2014-10-30 DIAGNOSIS — M25512 Pain in left shoulder: Secondary | ICD-10-CM | POA: Diagnosis not present

## 2014-10-30 DIAGNOSIS — M797 Fibromyalgia: Secondary | ICD-10-CM | POA: Diagnosis not present

## 2014-10-31 DIAGNOSIS — M7502 Adhesive capsulitis of left shoulder: Secondary | ICD-10-CM | POA: Diagnosis not present

## 2014-11-08 ENCOUNTER — Ambulatory Visit (INDEPENDENT_AMBULATORY_CARE_PROVIDER_SITE_OTHER): Payer: Medicare Other | Admitting: Cardiovascular Disease

## 2014-11-08 ENCOUNTER — Encounter: Payer: Self-pay | Admitting: Cardiovascular Disease

## 2014-11-08 VITALS — BP 110/74 | HR 84 | Ht 65.0 in | Wt 131.0 lb

## 2014-11-08 DIAGNOSIS — R002 Palpitations: Secondary | ICD-10-CM | POA: Diagnosis not present

## 2014-11-08 DIAGNOSIS — I493 Ventricular premature depolarization: Secondary | ICD-10-CM | POA: Insufficient documentation

## 2014-11-08 NOTE — Patient Instructions (Addendum)
Medication Instructions:  Your physician recommends that you continue on your current medications as directed. Please refer to the Current Medication list given to you today.   Labwork: None Ordered   Testing/Procedures: Your physician has recommended that you wear an event monitor. Event monitors are medical devices that record the heart's electrical activity. Doctors most often [place] these monitors to diagnose arrhythmias. Arrhythmias are problems with the speed or rhythm of the heartbeat. The monitor is a small, portable device. You can wear one while you do your normal daily activities. This is usually used to diagnose what is causing palpitations/syncope (passing out).    Follow-Up: Your physician recommends that you schedule a follow-up appointment in: 2-3 months with Dr. Acie Fredrickson.

## 2014-11-08 NOTE — Progress Notes (Signed)
Cardiology Office Note   Date:  11/08/2014   ID:  [name], DOB [birth year], MRN [id number]  PCP:  Tivis Ringer, MD  Cardiologist:   Thayer Headings, MD   Chief Complaint  Patient presents with  . Palpitations   Problem list 1. Palpitations    History of Present Illness: [name] is a 64 y.o. female who presents for evaluation of some chest pain  Several months  Difficulty breathing  Associated with palpitations . Felt like she could not get a breath.  Occurs sporatically , not associated with exercise .  Episodes of palpitations last several minutes.   No regular exercise but is able to do her normal activities without any problems   Has not been sleeping well.  Has some orthopedic issues that wake her up at night .     Past Medical History  Diagnosis Date  . GERD (gastroesophageal reflux disease)   . IBS (irritable bowel syndrome)   . Mental disorder     depression  . Migraines   . Abnormal Pap smear     hx of colpo and cryo  . Thyroid disease   . Herpes   . Insomnia   . Depression   . Ulcer   . Arthritis     osteoarthritis  . Endometriosis   . Fibromyalgia     muscle spasms, joint pain triggered by stress  . History of blood transfusion 77    [place]  . Arthritis   . Osteopenia   . Meniscus tear     Right knee  . Heart murmur   . Hypothyroidism   . Anxiety   . Chest pain   . SOB (shortness of breath)   . Palpitations   . Diaphoresis     Past Surgical History  Procedure Laterality Date  . Cervical fusion  2002    x2  . Elbow surgery    . Tonsilectomy, adenoidectomy, bilateral myringotomy and tubes    . Appendectomy      age 48  . Laparoscopy      age 76  . Cataract extraction Bilateral 2012  . Cholecystectomy  2003  . Abdominal hysterectomy  1990  . Bartholin cyst marsupialization Right 07/05/2012    Procedure: BARTHOLIN CYST MARSUPIALIZATION;  Surgeon: Arloa Koh, MD;  Location: [facility];  Service: Gynecology;   Laterality: Right;  Excision of right Bartholin Gland  . Knee surgery Right 07/2012    menicus tear repair  . Gynecologic cryosurgery    . Colposcopy w/ biopsy / curettage      30 years ago  . Bunionectomy      left foot   . Total knee arthroplasty Right 12/11/2013    Procedure: RIGHT TOTAL KNEE ARTHROPLASTY;  Surgeon: Gearlean Alf, MD;  Location: [facility];  Service: Orthopedics;  Laterality: Right;     Current Outpatient Prescriptions  Medication Sig Dispense Refill  . acetaminophen (TYLENOL) 500 MG tablet Take 1,000 mg by mouth once as needed for mild pain or headache.    . ARIPiprazole (ABILIFY) 5 MG tablet Take 2.5 mg by mouth at bedtime.    . cyclobenzaprine (FLEXERIL) 10 MG tablet Take 10 mg by mouth at bedtime.     Marland Kitchen denosumab (PROLIA) 60 MG/ML SOLN injection Inject 60 mg into the skin every 6 (six) months. Administer in upper arm, thigh, or abdomen    . dexlansoprazole (DEXILANT) 60 MG capsule Take 60 mg by mouth every morning. Takes with  a glass of water.    [name] Calcium (STOOL SOFTENER PO) Take 3 tablets by mouth 2 (two) times daily.    . DULoxetine (CYMBALTA) 60 MG capsule Take 60 mg by mouth every morning.     Marland Kitchen estradiol (ESTRACE) 0.5 MG tablet Take 1 tablet (0.5 mg total) by mouth daily. 90 tablet 3  . Estradiol 10 MCG TABS vaginal tablet Place 1 tablet (10 mcg total) vaginally 2 (two) times a week. 8 tablet 11  . levothyroxine (SYNTHROID, LEVOTHROID) 125 MCG tablet Take 125 mcg by mouth daily before breakfast. One hour before meal.    . Linaclotide (LINZESS) 290 MCG CAPS Take 1 capsule by mouth every morning. Takes with a glass of water.    Marland Kitchen LORazepam (ATIVAN) 1 MG tablet Take 2 mg by mouth at bedtime.    Marland Kitchen MAGNESIUM PO Take 3-4 tablets by mouth 2 (two) times daily. 3 tablets in the am and 4 tablets at night    . methocarbamol (ROBAXIN) 500 MG tablet Take 1 tablet (500 mg total) by mouth every 8 (eight) hours as needed for muscle spasms. 80 tablet 0  . OVER THE  COUNTER MEDICATION Take 1 capsule by mouth daily. Eye health capsule daily- l    . Polyethyl Glycol-Propyl Glycol (SYSTANE OP) Place 1 drop into both eyes 2 (two) times daily.    . pregabalin (LYRICA) 75 MG capsule Take 75 mg by mouth 2 (two) times daily.    . SUMAtriptan (IMITREX) 6 MG/0.5ML SOLN injection Inject 6 mg into the skin every 2 (two) hours as needed for migraine or headache. F    . terconazole (TERAZOL 7) 0.4 % vaginal cream Place 1 applicator vaginally at bedtime. 45 g 0  . topiramate (TOPAMAX) 50 MG tablet Take 150 mg by mouth at bedtime.     . valACYclovir (VALTREX) 500 MG tablet Take 1 tablet (500 mg total) by mouth daily. 90 tablet 3   No current facility-administered medications for this visit.    Allergies:   Codeine; Nsaids; and Sulfa antibiotics    Social History:  The patient  reports that she has never smoked. She has never used smokeless tobacco. She reports that she drinks about 7.2 - 8.4 oz of alcohol per week. She reports that she does not use illicit drugs.   Family History:  The patient's family history includes Alcohol abuse in her brother and father; Anxiety disorder in her brother; Cancer in her father; Colon cancer in her father; Dementia in her mother; Depression in her brother; Heart disease in her father; Hypertension in her father and mother; Insomnia in her brother; Kidney failure in her father; Osteoporosis in her mother; Rheum arthritis in her mother; Stroke in her mother.    ROS:  Please see the history of present illness.    Review of Systems: Constitutional:  denies fever, chills, diaphoresis, appetite change and fatigue.  HEENT: denies photophobia, eye pain, redness, hearing loss, ear pain, congestion, sore throat, rhinorrhea, sneezing, neck pain, neck stiffness and tinnitus.  Respiratory: denies SOB, DOE, cough, chest tightness, and wheezing.  Cardiovascular: admits to  palpitations and leg swelling. No chest pain   Gastrointestinal: denies  nausea, vomiting, abdominal pain, diarrhea, constipation, blood in stool.  Genitourinary: denies dysuria, urgency, frequency, hematuria, flank pain and difficulty urinating.  Musculoskeletal: denies  myalgias, back pain, joint swelling, arthralgias and gait problem.   Skin: denies pallor, rash and wound.  Neurological: denies dizziness, seizures, syncope, weakness, light-headedness, numbness and headaches.  Hematological: denies adenopathy, easy bruising, personal or family bleeding history.  Psychiatric/ Behavioral: denies suicidal ideation, mood changes, confusion, nervousness, sleep disturbance and agitation.       All other systems are reviewed and negative.    PHYSICAL EXAM: VS:  BP 110/74 mmHg  Pulse 84  Ht 5\' 5"  (1.651 m)  Wt 59.421 kg (131 lb)  BMI 21.80 kg/m2  LMP 03/02/1988 , BMI Body mass index is 21.8 kg/(m^2). GEN: Well nourished, well developed, in no acute distress HEENT: normal Neck: no JVD, carotid bruits, or masses Cardiac: RRR; no murmurs, rubs, or gallops,no edema  Respiratory:  clear to auscultation bilaterally, normal work of breathing GI: soft, nontender, nondistended, + BS MS: no deformity or atrophy Skin: warm and dry, no rash Neuro:  Strength and sensation are intact Psych: normal   EKG:  EKG is ordered today. The ekg ordered today demonstrates NSR at 84.  Inc. RBBB    Recent Labs: 11/30/2013: ALT 26 12/13/2013: BUN 6; Creatinine, Ser 0.76; Hemoglobin 9.8*; Platelets 163; Potassium 4.0; Sodium 141    Lipid Panel No results found for: CHOL, TRIG, HDL, CHOLHDL, VLDL, LDLCALC, LDLDIRECT    Wt Readings from Last 3 Encounters:  11/08/14 59.421 kg (131 lb)  05/30/14 56.246 kg (124 lb)  04/06/14 55.883 kg (123 lb 3.2 oz)      Other studies Reviewed: Additional studies/ records that were reviewed today include: . Review of the above records demonstrates:    ASSESSMENT AND PLAN:  1.  Palpitations: Patient presents with palpitations that are  so should with some shortness of breath. She's already had an echo card gram which reveals normal left ventricle systolic motion. She has mild tricuspid regurgitation. I think that we should place a 30 day event monitor. This will help [place] decide whether or not she has significant arrhythmias. I've reassured her that I suspect that these arrhythmias are fairly benign.  I'll see her again in follow-up visit in 2-3 months.   Current medicines are reviewed at length with the patient today.  The patient does not have concerns regarding medicines.  The following changes have been made:  no change  Labs/ tests ordered today include:  No orders of the defined types were placed in this encounter.     Disposition:   FU with me in 2-3 months      Ashrith Sagan, Wonda Cheng, MD  11/08/2014 4:19 PM    [facility] [place], [place], [place]  [address] Phone: [phone]; Fax: [phone]   [facility]  [address] [place],   [address] [phone]   Fax [phone]

## 2014-11-13 ENCOUNTER — Ambulatory Visit (INDEPENDENT_AMBULATORY_CARE_PROVIDER_SITE_OTHER): Payer: Medicare Other

## 2014-11-13 DIAGNOSIS — R002 Palpitations: Secondary | ICD-10-CM | POA: Diagnosis not present

## 2014-11-14 DIAGNOSIS — M7502 Adhesive capsulitis of left shoulder: Secondary | ICD-10-CM | POA: Diagnosis not present

## 2014-11-20 DIAGNOSIS — G43019 Migraine without aura, intractable, without status migrainosus: Secondary | ICD-10-CM | POA: Diagnosis not present

## 2014-11-20 DIAGNOSIS — G43719 Chronic migraine without aura, intractable, without status migrainosus: Secondary | ICD-10-CM | POA: Diagnosis not present

## 2014-11-21 DIAGNOSIS — M7502 Adhesive capsulitis of left shoulder: Secondary | ICD-10-CM | POA: Diagnosis not present

## 2014-11-21 DIAGNOSIS — M7711 Lateral epicondylitis, right elbow: Secondary | ICD-10-CM | POA: Diagnosis not present

## 2014-11-21 DIAGNOSIS — M25521 Pain in right elbow: Secondary | ICD-10-CM | POA: Diagnosis not present

## 2014-11-21 DIAGNOSIS — G8929 Other chronic pain: Secondary | ICD-10-CM | POA: Diagnosis not present

## 2014-11-22 ENCOUNTER — Telehealth: Payer: Self-pay | Admitting: Cardiovascular Disease

## 2014-11-22 ENCOUNTER — Telehealth: Payer: Self-pay | Admitting: *Deleted

## 2014-11-22 DIAGNOSIS — I493 Ventricular premature depolarization: Secondary | ICD-10-CM

## 2014-11-22 NOTE — Telephone Encounter (Signed)
[employer] was called and they verified a transmission was sent 11/21/2014 after 10:00PM [place] time, which, patient had labelled with rapid heartbeat and SOB.  They will have the recording verified and uploaded by 11:30 AM.  I will print and review with Toni Amend, RN, once available.

## 2014-11-22 NOTE — Telephone Encounter (Signed)
Routed to [employer] and Markus Daft (Treadmill/Monitors) for information regarding strip of this patient.

## 2014-11-22 NOTE — Telephone Encounter (Signed)
  Pt is wearing a heart monitor. Last night around 10pm pt had an "episode". Her heart was racing and she had sob. It lasted for a little over an hour. This am she is ok but feel drained. Can you look at that strip and see what was going on? Please advise.     Shelly Wells/Monitor - pulled strip from the time in question. HR somewhat elevated but not in abnormal range. A strip showing 2-3 PVC's. Possible slight ST changes. Monitor strips placed into Dr. Elmarie Shiley box and this message routed to him.   Spoke to patient and she has not had any problems so far today except just feeling more fatigued upon wakening. Advised her that Dr. Acie Fredrickson would review and in the meanwhile, should she experience recurrence or worsening problem to call office back. Patient verbalized understanding and agreement.

## 2014-11-22 NOTE — Telephone Encounter (Signed)
New message      Pt is wearing a heart monitor.  Last night around 10pm pt had an "episode".  Her heart was racing and she had sob.  It lasted for a little over an hour. This am she is ok but feel drained. Can you look at that strip and see what was going on?  Please advise.

## 2014-11-22 NOTE — Telephone Encounter (Signed)
I have reviewed the strips. They reveal PVCs and perhaps PACs These are benign. contiue to monitor

## 2014-11-26 ENCOUNTER — Other Ambulatory Visit (INDEPENDENT_AMBULATORY_CARE_PROVIDER_SITE_OTHER): Payer: Medicare Other | Admitting: *Deleted

## 2014-11-26 DIAGNOSIS — I493 Ventricular premature depolarization: Secondary | ICD-10-CM

## 2014-11-26 LAB — BASIC METABOLIC PANEL
BUN: 20 mg/dL (ref 6–23)
CO2: 27 mEq/L (ref 19–32)
Calcium: 9.2 mg/dL (ref 8.4–10.5)
Chloride: 105 mEq/L (ref 96–112)
Creatinine, Ser: 1.01 mg/dL (ref 0.40–1.20)
GFR: 58.29 mL/min — ABNORMAL LOW (ref >60.00)
Glucose, Bld: 99 mg/dL (ref 70–99)
Potassium: 4 mEq/L (ref 3.5–5.1)
Sodium: 139 mEq/L (ref 135–145)

## 2014-11-26 MED ORDER — PROPRANOLOL HCL 10 MG PO TABS: 10.0000 mg | ORAL_TABLET | Freq: Four times a day (QID) | ORAL | Status: DC | PRN

## 2014-11-26 NOTE — Telephone Encounter (Signed)
Spoke with patient who states she did not receive a call back regarding Dr. Elmarie Shiley review of the monitor strips.  I reviewed his findings with her and possible causes, including low K+ and caffeine intake.  She advised that at times when she has these episodes she notes that she does not feel like she can take a deep breath.  She denies anxiety; states these episodes occur at various times throughout the day and night.  I advised her to come in for lab draw for bmet and that Dr. Acie Fredrickson, who is in the office today, will review and advise.  She verbalized understanding and agreement with plan of care.

## 2014-11-26 NOTE — Telephone Encounter (Addendum)
-----   Message from Thayer Headings, MD sent at 11/26/2014  5:34 PM EDT ----- Labs are good.  Notified patient of normal lab results and plan to start Propranolol 10 mg QID prn for symptoms per Dr. Acie Fredrickson.  Patient verbalized understanding and agreement with plan of care.  Rx to patient's pharmacy.  I advised her to call back with worsening symptoms or concerns.

## 2014-12-01 DIAGNOSIS — Z23 Encounter for immunization: Secondary | ICD-10-CM | POA: Diagnosis not present

## 2014-12-14 DIAGNOSIS — M2540 Effusion, unspecified joint: Secondary | ICD-10-CM | POA: Diagnosis not present

## 2014-12-14 DIAGNOSIS — M791 Myalgia: Secondary | ICD-10-CM | POA: Diagnosis not present

## 2014-12-14 DIAGNOSIS — L84 Corns and callosities: Secondary | ICD-10-CM | POA: Diagnosis not present

## 2014-12-14 DIAGNOSIS — Z6821 Body mass index (BMI) 21.0-21.9, adult: Secondary | ICD-10-CM | POA: Diagnosis not present

## 2014-12-17 ENCOUNTER — Telehealth: Payer: Self-pay | Admitting: Nurse Practitioner

## 2014-12-17 DIAGNOSIS — M542 Cervicalgia: Secondary | ICD-10-CM | POA: Diagnosis not present

## 2014-12-17 DIAGNOSIS — M791 Myalgia: Secondary | ICD-10-CM | POA: Diagnosis not present

## 2014-12-17 DIAGNOSIS — G43719 Chronic migraine without aura, intractable, without status migrainosus: Secondary | ICD-10-CM | POA: Diagnosis not present

## 2014-12-17 DIAGNOSIS — G518 Other disorders of facial nerve: Secondary | ICD-10-CM | POA: Diagnosis not present

## 2014-12-17 DIAGNOSIS — R51 Headache: Secondary | ICD-10-CM | POA: Diagnosis not present

## 2014-12-17 DIAGNOSIS — G43019 Migraine without aura, intractable, without status migrainosus: Secondary | ICD-10-CM | POA: Diagnosis not present

## 2014-12-17 NOTE — Telephone Encounter (Signed)
Reviewed cardiac monitor results with patient.  She states the PVC's are bothersome and cause her to feel like "something has scared her" even when she is not anxious or afraid.  I asked if the propranolol is helping her and she states it does help some. She reports it helps her sleep a little better but she is not taking it daily, only for persistent symptoms.  She states the PVC's are pretty bothersome.  I advised that I can discuss other medication options with Dr. Acie Fredrickson or can move her follow-up appointment with him from 11/8 to next week.  She elects to move appointment to Tuesday 10/25.  I advised her to call back with questions or concerns prior to that appointment.  She verbalized understanding and agreement.

## 2014-12-17 NOTE — Telephone Encounter (Signed)
Agree with moving pt's appt to a sooner date to discuss options for treating the PVCs

## 2014-12-17 NOTE — Telephone Encounter (Signed)
-----   Message from Thayer Headings, MD sent at 12/17/2014  3:57 PM EDT ----- NSR with occasional PVCs

## 2014-12-24 ENCOUNTER — Telehealth: Payer: Self-pay | Admitting: Obstetrics and Gynecology

## 2014-12-24 NOTE — Telephone Encounter (Signed)
Patient is requesting a refill for vagifem. She is using [facility] [address]. Patient wants to talk with the nurse.

## 2014-12-24 NOTE — Telephone Encounter (Signed)
Medication refill request: vagifem  Last AEX:  04/06/14 Dr. Quincy Simmonds Next AEX: 04/11/15 Dr. Quincy Simmonds Last MMG (if hormonal medication request): 05/30/14 [id number]:neg Refill authorized: 04/10/14 #[id number]/ 11R to [facility] [address].   Patient should have refills left until 04/2015.  Patient notified. [facility] will call [facility].

## 2014-12-25 ENCOUNTER — Encounter: Payer: Self-pay | Admitting: Cardiovascular Disease

## 2014-12-25 ENCOUNTER — Ambulatory Visit (INDEPENDENT_AMBULATORY_CARE_PROVIDER_SITE_OTHER): Payer: Medicare Other | Admitting: Cardiovascular Disease

## 2014-12-25 VITALS — BP 104/64 | HR 79 | Ht 65.0 in | Wt 133.1 lb

## 2014-12-25 DIAGNOSIS — I493 Ventricular premature depolarization: Secondary | ICD-10-CM

## 2014-12-25 MED ORDER — METOPROLOL SUCCINATE ER 25 MG PO TB24: 25.0000 mg | ORAL_TABLET | Freq: Every day | ORAL | Status: DC

## 2014-12-25 NOTE — Progress Notes (Signed)
Cardiology Office Note   Date:  12/25/2014   ID:  [name], DOB [birth year], MRN [id number]  PCP:  Tivis Ringer, MD  Cardiologist:   Thayer Headings, MD   Chief Complaint  Patient presents with  . Follow-up    palpitatinos   Problem list 1. Palpitations    History of Present Illness: [name] is a 64 y.o. female who presents for evaluation of some chest pain  Several months  Difficulty breathing  Associated with palpitations . Felt like she could not get a breath.  Occurs sporatically , not associated with exercise .  Episodes of palpitations last several minutes.   No regular exercise but is able to do her normal activities without any problems   Has not been sleeping well.  Has some orthopedic issues that wake her up at night .   Oct. 25, 2016: Still having palps. Event mointor shows occasional PVCs. We gave propranolol - takes 20 mg with some relief. Takes 2 every day    Past Medical History  Diagnosis Date  . GERD (gastroesophageal reflux disease)   . IBS (irritable bowel syndrome)   . Mental disorder     depression  . Migraines   . Abnormal Pap smear     hx of colpo and cryo  . Thyroid disease   . Herpes   . Insomnia   . Depression   . Ulcer   . Arthritis     osteoarthritis  . Endometriosis   . Fibromyalgia     muscle spasms, joint pain triggered by stress  . History of blood transfusion 77    [place]  . Arthritis   . Osteopenia   . Meniscus tear     Right knee  . Heart murmur   . Hypothyroidism   . Anxiety   . Chest pain   . SOB (shortness of breath)   . Palpitations   . Diaphoresis     Past Surgical History  Procedure Laterality Date  . Cervical fusion  2002    x2  . Elbow surgery    . Tonsilectomy, adenoidectomy, bilateral myringotomy and tubes    . Appendectomy      age 37  . Laparoscopy      age 27  . Cataract extraction Bilateral 2012  . Cholecystectomy  2003  . Abdominal hysterectomy  1990  .  Bartholin cyst marsupialization Right 07/05/2012    Procedure: BARTHOLIN CYST MARSUPIALIZATION;  Surgeon: Arloa Koh, MD;  Location: [facility];  Service: Gynecology;  Laterality: Right;  Excision of right Bartholin Gland  . Knee surgery Right 07/2012    menicus tear repair  . Gynecologic cryosurgery    . Colposcopy w/ biopsy / curettage      30 years ago  . Bunionectomy      left foot   . Total knee arthroplasty Right 12/11/2013    Procedure: RIGHT TOTAL KNEE ARTHROPLASTY;  Surgeon: Gearlean Alf, MD;  Location: [facility];  Service: Orthopedics;  Laterality: Right;     Current Outpatient Prescriptions  Medication Sig Dispense Refill  . acetaminophen (TYLENOL) 500 MG tablet Take 1,000 mg by mouth once as needed for mild pain or headache.    . ARIPiprazole (ABILIFY) 5 MG tablet Take 2.5 mg by mouth at bedtime.    . cyclobenzaprine (FLEXERIL) 10 MG tablet Take 10 mg by mouth at bedtime.     Marland Kitchen denosumab (PROLIA) 60 MG/ML SOLN injection Inject 60 mg into the  skin every 6 (six) months. Administer in upper arm, thigh, or abdomen    . dexlansoprazole (DEXILANT) 60 MG capsule Take 60 mg by mouth every morning. Takes with a glass of water.    [name] Calcium (STOOL SOFTENER PO) Take 3 tablets by mouth 2 (two) times daily.    . DULoxetine (CYMBALTA) 60 MG capsule Take 60 mg by mouth every morning.     Marland Kitchen estradiol (ESTRACE) 0.5 MG tablet Take 1 tablet (0.5 mg total) by mouth daily. 90 tablet 3  . Estradiol 10 MCG TABS vaginal tablet Place 1 tablet (10 mcg total) vaginally 2 (two) times a week. 8 tablet 11  . gabapentin (NEURONTIN) 600 MG tablet Take 600 mg by mouth daily.    Marland Kitchen levothyroxine (SYNTHROID, LEVOTHROID) 125 MCG tablet Take 125 mcg by mouth daily before breakfast. One hour before meal.    . Linaclotide (LINZESS) 290 MCG CAPS Take 1 capsule by mouth every morning. Takes with a glass of water.    Marland Kitchen LORazepam (ATIVAN) 1 MG tablet Take 2 mg by mouth at bedtime.    Marland Kitchen MAGNESIUM PO Take 3-4  tablets by mouth 2 (two) times daily. 3 tablets in the am and 4 tablets at night    . methocarbamol (ROBAXIN) 500 MG tablet Take 1 tablet (500 mg total) by mouth every 8 (eight) hours as needed for muscle spasms. 80 tablet 0  . OVER THE COUNTER MEDICATION Take 1 capsule by mouth daily. Eye health capsule daily- l    . Polyethyl Glycol-Propyl Glycol (SYSTANE OP) Place 1 drop into both eyes 2 (two) times daily.    . pregabalin (LYRICA) 75 MG capsule Take 75 mg by mouth 2 (two) times daily.    . propranolol (INDERAL) 10 MG tablet Take 1 tablet (10 mg total) by mouth 4 (four) times daily as needed (as needed for fast heart rate and/or palpitations). 60 tablet 6  . SUMAtriptan (IMITREX) 6 MG/0.5ML SOLN injection Inject 6 mg into the skin every 2 (two) hours as needed for migraine or headache. F    . terconazole (TERAZOL 7) 0.4 % vaginal cream Place 1 applicator vaginally at bedtime. 45 g 0  . valACYclovir (VALTREX) 500 MG tablet Take 1 tablet (500 mg total) by mouth daily. 90 tablet 3  . topiramate (TOPAMAX) 50 MG tablet Take 150 mg by mouth at bedtime.      No current facility-administered medications for this visit.    Allergies:   Codeine; Nsaids; and Sulfa antibiotics    Social History:  The patient  reports that she has never smoked. She has never used smokeless tobacco. She reports that she drinks about 7.2 - 8.4 oz of alcohol per week. She reports that she does not use illicit drugs.   Family History:  The patient's family history includes Alcohol abuse in her brother and father; Anxiety disorder in her brother; Cancer in her father; Colon cancer in her father; Dementia in her mother; Depression in her brother; Heart disease in her father; Hypertension in her father and mother; Insomnia in her brother; Kidney failure in her father; Osteoporosis in her mother; Rheum arthritis in her mother; Stroke in her mother.    ROS:  Please see the history of present illness.    Review of  Systems: Constitutional:  denies fever, chills, diaphoresis, appetite change and fatigue.  HEENT: denies photophobia, eye pain, redness, hearing loss, ear pain, congestion, sore throat, rhinorrhea, sneezing, neck pain, neck stiffness and tinnitus.  Respiratory: denies  SOB, DOE, cough, chest tightness, and wheezing.  Cardiovascular: admits to  palpitations and leg swelling. No chest pain   Gastrointestinal: denies nausea, vomiting, abdominal pain, diarrhea, constipation, blood in stool.  Genitourinary: denies dysuria, urgency, frequency, hematuria, flank pain and difficulty urinating.  Musculoskeletal: denies  myalgias, back pain, joint swelling, arthralgias and gait problem.   Skin: denies pallor, rash and wound.  Neurological: denies dizziness, seizures, syncope, weakness, light-headedness, numbness and headaches.   Hematological: denies adenopathy, easy bruising, personal or family bleeding history.  Psychiatric/ Behavioral: denies suicidal ideation, mood changes, confusion, nervousness, sleep disturbance and agitation.       All other systems are reviewed and negative.    PHYSICAL EXAM: VS:  BP 104/64 mmHg  Pulse 79  Ht 5\' 5"  (1.651 m)  Wt 133 lb 1.9 oz (60.383 kg)  BMI 22.15 kg/m2  LMP 03/02/1988 , BMI Body mass index is 22.15 kg/(m^2). GEN: Well nourished, well developed, in no acute distress HEENT: normal Neck: no JVD, carotid bruits, or masses Cardiac: RRR; no murmurs, rubs, or gallops,no edema  Respiratory:  clear to auscultation bilaterally, normal work of breathing GI: soft, nontender, nondistended, + BS MS: no deformity or atrophy Skin: warm and dry, no rash Neuro:  Strength and sensation are intact Psych: normal   EKG:  EKG is ordered today. The ekg ordered today demonstrates NSR at 84.  Inc. RBBB    Recent Labs: 11/26/2014: BUN 20; Creatinine, Ser 1.01; Potassium 4.0; Sodium 139    Lipid Panel No results found for: CHOL, TRIG, HDL, CHOLHDL, VLDL, LDLCALC,  LDLDIRECT    Wt Readings from Last 3 Encounters:  12/25/14 133 lb 1.9 oz (60.383 kg)  11/08/14 131 lb (59.421 kg)  05/30/14 124 lb (56.246 kg)      Other studies Reviewed: Additional studies/ records that were reviewed today include: . Review of the above records demonstrates:    ASSESSMENT AND PLAN:  1.  Palpitations:  She has normal left ventricle systolic function by echocardiogram at Dr. Danna Hefty  Office. She was found have occasional episodes of premature ventricular contractions by the event monitor. She seems to have a good response to the propranolol but it wears off fairly quickly. Her. Her on Toprol-XL 25 mg a day. She may continue to take the propranolol as needed.  I'll see her in 3 months.   Current medicines are reviewed at length with the patient today.  The patient does not have concerns regarding medicines.  The following changes have been made:  no change  Labs/ tests ordered today include:  No orders of the defined types were placed in this encounter.     Disposition:   FU with me in  3 months      Nahser, Wonda Cheng, MD  12/25/2014 10:08 AM    [facility] [place], [place], [place]  [address] Phone: [phone]; Fax: [phone]   [facility]  [address] [place], [place]  [address] [phone]   Fax [phone]

## 2014-12-25 NOTE — Patient Instructions (Signed)
Medication Instructions:   START TAKING METOPROLOL XL 25 MG ONCE A DAY    Labwork: NONE ORDER TODAY    Testing/Procedures: NONE ORDER TODAY   Follow-Up:   IN 3 MONTHS WITH DR Acie Fredrickson    Any Other Special Instructions Will Be Listed Below (If Applicable).

## 2014-12-31 ENCOUNTER — Ambulatory Visit (HOSPITAL_COMMUNITY)
Admission: RE | Admit: 2014-12-31 | Discharge: 2014-12-31 | Disposition: A | Discharge status: Home / Self Care | Payer: Medicare Other | Source: Ambulatory Visit | Attending: Internal Medicine | Admitting: Internal Medicine

## 2014-12-31 ENCOUNTER — Other Ambulatory Visit (HOSPITAL_COMMUNITY): Payer: Self-pay | Admitting: Internal Medicine

## 2014-12-31 ENCOUNTER — Encounter (HOSPITAL_COMMUNITY): Payer: Self-pay

## 2014-12-31 DIAGNOSIS — M810 Age-related osteoporosis without current pathological fracture: Secondary | ICD-10-CM | POA: Diagnosis not present

## 2014-12-31 MED ORDER — DENOSUMAB 60 MG/ML ~~LOC~~ SOLN
60.0000 mg | Freq: Once | SUBCUTANEOUS | Status: AC
  Administered 2014-12-31: 60 mg via SUBCUTANEOUS
  Filled 2014-12-31: qty 1

## 2014-12-31 NOTE — Discharge Instructions (Signed)
Denosumab injection  What is this medicine?  DENOSUMAB (den oh sue mab) slows bone breakdown. Prolia is used to treat osteoporosis in women after menopause and in men. Xgeva is used to prevent bone fractures and other bone problems caused by cancer bone metastases. Xgeva is also used to treat giant cell tumor of the bone.  This medicine may be used for other purposes; ask your health care provider or pharmacist if you have questions.  What should I tell my health care provider before I take this medicine?  They need to know if you have any of these conditions:  -dental disease  -eczema  -infection or history of infections  -kidney disease or on dialysis  -low blood calcium or vitamin D  -malabsorption syndrome  -scheduled to have surgery or tooth extraction  -taking medicine that contains denosumab  -thyroid or parathyroid disease  -an unusual reaction to denosumab, other medicines, foods, dyes, or preservatives  -pregnant or trying to get pregnant  -breast-feeding  How should I use this medicine?  This medicine is for injection under the skin. It is given by a health care professional in a hospital or clinic setting.  If you are getting Prolia, a special MedGuide will be given to you by the pharmacist with each prescription and refill. Be sure to read this information carefully each time.  For Prolia, talk to your pediatrician regarding the use of this medicine in children. Special care may be needed. For Xgeva, talk to your pediatrician regarding the use of this medicine in children. While this drug may be prescribed for children as young as 13 years for selected conditions, precautions do apply.  Overdosage: If you think you have taken too much of this medicine contact a poison control center or emergency room at once.  NOTE: This medicine is only for you. Do not share this medicine with others.  What if I miss a dose?  It is important not to miss your dose. Call your doctor or health care professional if you are  unable to keep an appointment.  What may interact with this medicine?  Do not take this medicine with any of the following medications:  -other medicines containing denosumab  This medicine may also interact with the following medications:  -medicines that suppress the immune system  -medicines that treat cancer  -steroid medicines like prednisone or cortisone  This list may not describe all possible interactions. Give your health care provider a list of all the medicines, herbs, non-prescription drugs, or dietary supplements you use. Also tell them if you smoke, drink alcohol, or use illegal drugs. Some items may interact with your medicine.  What should I watch for while using this medicine?  Visit your doctor or health care professional for regular checks on your progress. Your doctor or health care professional may order blood tests and other tests to see how you are doing.  Call your doctor or health care professional if you get a cold or other infection while receiving this medicine. Do not treat yourself. This medicine may decrease your body's ability to fight infection.  You should make sure you get enough calcium and vitamin D while you are taking this medicine, unless your doctor tells you not to. Discuss the foods you eat and the vitamins you take with your health care professional.  See your dentist regularly. Brush and floss your teeth as directed. Before you have any dental work done, tell your dentist you are receiving this medicine.  Do   not become pregnant while taking this medicine or for 5 months after stopping it. Women should inform their doctor if they wish to become pregnant or think they might be pregnant. There is a potential for serious side effects to an unborn child. Talk to your health care professional or pharmacist for more information.  What side effects may I notice from receiving this medicine?  Side effects that you should report to your doctor or health care professional as soon as  possible:  -allergic reactions like skin rash, itching or hives, swelling of the face, lips, or tongue  -breathing problems  -chest pain  -fast, irregular heartbeat  -feeling faint or lightheaded, falls  -fever, chills, or any other sign of infection  -muscle spasms, tightening, or twitches  -numbness or tingling  -skin blisters or bumps, or is dry, peels, or red  -slow healing or unexplained pain in the mouth or jaw  -unusual bleeding or bruising  Side effects that usually do not require medical attention (Report these to your doctor or health care professional if they continue or are bothersome.):  -muscle pain  -stomach upset, gas  This list may not describe all possible side effects. Call your doctor for medical advice about side effects. You may report side effects to FDA at 1-800-FDA-1088.  Where should I keep my medicine?  This medicine is only given in a clinic, doctor's office, or other health care setting and will not be stored at home.  NOTE: This sheet is a summary. It may not cover all possible information. If you have questions about this medicine, talk to your doctor, pharmacist, or health care provider.      2016, Elsevier/Gold Standard. (2011-08-17 12:37:47)

## 2015-01-03 DIAGNOSIS — G43019 Migraine without aura, intractable, without status migrainosus: Secondary | ICD-10-CM | POA: Diagnosis not present

## 2015-01-03 DIAGNOSIS — G518 Other disorders of facial nerve: Secondary | ICD-10-CM | POA: Diagnosis not present

## 2015-01-03 DIAGNOSIS — R51 Headache: Secondary | ICD-10-CM | POA: Diagnosis not present

## 2015-01-03 DIAGNOSIS — M542 Cervicalgia: Secondary | ICD-10-CM | POA: Diagnosis not present

## 2015-01-03 DIAGNOSIS — G43719 Chronic migraine without aura, intractable, without status migrainosus: Secondary | ICD-10-CM | POA: Diagnosis not present

## 2015-01-03 DIAGNOSIS — M791 Myalgia: Secondary | ICD-10-CM | POA: Diagnosis not present

## 2015-01-07 ENCOUNTER — Telehealth: Payer: Self-pay | Admitting: *Deleted

## 2015-01-07 DIAGNOSIS — R002 Palpitations: Secondary | ICD-10-CM | POA: Diagnosis not present

## 2015-01-07 DIAGNOSIS — M2540 Effusion, unspecified joint: Secondary | ICD-10-CM | POA: Diagnosis not present

## 2015-01-07 DIAGNOSIS — F329 Major depressive disorder, single episode, unspecified: Secondary | ICD-10-CM | POA: Diagnosis not present

## 2015-01-07 DIAGNOSIS — G43909 Migraine, unspecified, not intractable, without status migrainosus: Secondary | ICD-10-CM | POA: Diagnosis not present

## 2015-01-07 DIAGNOSIS — E039 Hypothyroidism, unspecified: Secondary | ICD-10-CM | POA: Diagnosis not present

## 2015-01-07 DIAGNOSIS — Z6821 Body mass index (BMI) 21.0-21.9, adult: Secondary | ICD-10-CM | POA: Diagnosis not present

## 2015-01-07 NOTE — Telephone Encounter (Signed)
She may still use the Propranolol as needed for palpitations at night

## 2015-01-07 NOTE — Telephone Encounter (Signed)
Pt states that she has been on Toprol XL 25mg  daily since office visit with Dr Acie Fredrickson 12/25/14. Pt states palpitations are improved but she still has palpitations every night that keep her awake.  Pt states she saw Dr Dagmar Hait today-BP today 110/65, unsure of heart rate, she states Dr Dagmar Hait listened to her heart.   Pt states she has not used propranolol prn since she has been taking Toprol regularly.  Pt advised I will forward to Dr Acie Fredrickson for recommendations.

## 2015-01-08 ENCOUNTER — Ambulatory Visit: Payer: Medicare Other | Admitting: Cardiovascular Disease

## 2015-01-08 NOTE — Telephone Encounter (Signed)
Pt advised, verbalized understanding, will call back in 10-14 days if she is continuing to have palpitations at night.

## 2015-01-16 ENCOUNTER — Ambulatory Visit (INDEPENDENT_AMBULATORY_CARE_PROVIDER_SITE_OTHER): Payer: Medicare Other | Admitting: Podiatry

## 2015-01-16 ENCOUNTER — Encounter: Payer: Self-pay | Admitting: Podiatry

## 2015-01-16 ENCOUNTER — Ambulatory Visit (INDEPENDENT_AMBULATORY_CARE_PROVIDER_SITE_OTHER): Payer: Medicare Other

## 2015-01-16 VITALS — BP 112/72 | HR 84 | Resp 12

## 2015-01-16 DIAGNOSIS — M7752 Other enthesopathy of left foot: Secondary | ICD-10-CM

## 2015-01-16 DIAGNOSIS — R52 Pain, unspecified: Secondary | ICD-10-CM

## 2015-01-16 NOTE — Progress Notes (Signed)
   Subjective:    Patient ID: [name], female    DOB: Aug 14, 1950, 64 y.o.   MRN: [id number]  HPI    This patient presents today with a three-month history of her low-grade persistent swelling in the third toe left foot as well as low-grade soreness in the toe. The swelling persists on and off weightbearing. Discomfort increases with activity, however, does not restrict patient's activity. Discomfort reduces with rest. Patient denies any other areas of joint pain or swelling. She denies any other areas of pain and swelling in her body other than the third toe on the left foot. She also questions the treatment of the calluses on the medial hallux bilaterally which she self treats   Review of Systems  HENT: Positive for hearing loss.   Musculoskeletal: Positive for joint swelling.  Hematological: Bruises/bleeds easily.       Objective:   Physical Exam  Orientated 3  Vascular: DP and PT pulses 2/4 bilaterally Capillary reflex immediate bilaterally Low-grade edema third digit left  Neurological: Sensation to 10 g monofilament wire intact 5/5 bilaterally Vibratory sensation intact bilaterally Ankle reflex equal and reactive bilaterally  Dermatological: Low-grade edema third toe left Callus medial hallux bilaterally  Musculoskeletal: Mild palpable tenderness in the third toe left over the proximal and distal interphalangeal joints Mallet toe third and fourth left  X-ray examination left foot dated 01/16/2015  Intact bony structure without fracture and/or dislocation No emphysema noted No increased soft tissue density noted Surgical resection medial condyle head of first metatarsal Tailor bunion deformity Mallet toes 3, 4 Bone density appears adequate Joint spaces appear adequate  Radiographic impression: No acute bony abnormality noted in the left foot     Assessment & Plan:   Assessment: Edema of unknown origin third toe left foot No acute bony abnormality  noted 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 I told her that I could refer to the lab for an arthritic panel to rule out inflammatory disease, however, patient said she was not interested in having this lab test done. I also recommended that she could use 1 inch Coflex to gently compress the swelling in the toe with the emphasis not overtighten  I recommended patient use pumice stone or callus file to gently say and the calluses on the hallux on an as-needed basis  Patient return pain and swelling increases  Reappoint at patient's request

## 2015-01-16 NOTE — Patient Instructions (Signed)
Today your x-ray was negative for fracture or any significant arthritic change. The swelling in year third left toes most likely consistent with low-grade chronic arthritis, most likely noninflammatory. Monitor swelling and if increases or affects other joints present for further evaluation. Okay to use 1 inch Coflex tape to compress swelling on the third left toe, do not overtighten

## 2015-02-05 ENCOUNTER — Telehealth: Payer: Self-pay | Admitting: Cardiovascular Disease

## 2015-02-05 NOTE — Telephone Encounter (Signed)
New message   Patient calling    Pt C/O medication issue:  1. Name of Medication: metoprolol succinate  25 mg   2. How are you currently taking this medication (dosage and times per day)? At night   3. Are you having a reaction (difficulty breathing--STAT)? Hallucination    4. What is your medication issue?  Wants to discuss with MD or Nurse

## 2015-02-05 NOTE — Telephone Encounter (Signed)
Spoke with patient who states she has been having hallucinations that she believes began when she started propranolol.  States she had a hallucination early this morning.  States she started noticing an occasional occurrence that she thought was part of a dream when she started the propranolol in October.  She currently takes Toprol XL 25 mg at night and states she no longer needs the prn propranolol as palpitations have ceased.  She has also recently started taking gabapentin.  She states she believes the hallucinations started prior to starting gabapentin.  I advised her that she can try cutting the dose in half to see if symptoms improve or I can discuss changing medications with Dr. Acie Fredrickson.  She states she will consider cutting the dose in half and will call back if hallucinations continue.

## 2015-02-05 NOTE — Telephone Encounter (Signed)
I'm not sure that the propranolol is causing the hallucinations. She is taking it as needed so she may choose not to take it if she thinks its causing problems

## 2015-02-21 DIAGNOSIS — M797 Fibromyalgia: Secondary | ICD-10-CM | POA: Diagnosis not present

## 2015-02-21 DIAGNOSIS — M810 Age-related osteoporosis without current pathological fracture: Secondary | ICD-10-CM | POA: Diagnosis not present

## 2015-02-21 DIAGNOSIS — M7072 Other bursitis of hip, left hip: Secondary | ICD-10-CM | POA: Diagnosis not present

## 2015-02-21 DIAGNOSIS — R5383 Other fatigue: Secondary | ICD-10-CM | POA: Diagnosis not present

## 2015-02-21 DIAGNOSIS — M06212 Rheumatoid bursitis, left shoulder: Secondary | ICD-10-CM | POA: Diagnosis not present

## 2015-02-27 DIAGNOSIS — Z79899 Other long term (current) drug therapy: Secondary | ICD-10-CM | POA: Diagnosis not present

## 2015-02-27 DIAGNOSIS — M859 Disorder of bone density and structure, unspecified: Secondary | ICD-10-CM | POA: Diagnosis not present

## 2015-02-27 DIAGNOSIS — D539 Nutritional anemia, unspecified: Secondary | ICD-10-CM | POA: Diagnosis not present

## 2015-02-27 DIAGNOSIS — N951 Menopausal and female climacteric states: Secondary | ICD-10-CM | POA: Diagnosis not present

## 2015-03-26 DIAGNOSIS — J300 Vasomotor rhinitis: Secondary | ICD-10-CM | POA: Diagnosis not present

## 2015-03-29 ENCOUNTER — Ambulatory Visit: Payer: Medicare Other | Admitting: Cardiovascular Disease

## 2015-04-01 ENCOUNTER — Ambulatory Visit (INDEPENDENT_AMBULATORY_CARE_PROVIDER_SITE_OTHER): Payer: Medicare Other | Admitting: Cardiovascular Disease

## 2015-04-01 ENCOUNTER — Encounter: Payer: Self-pay | Admitting: Cardiovascular Disease

## 2015-04-01 VITALS — BP 90/50 | HR 80 | Ht 65.0 in | Wt 136.0 lb

## 2015-04-01 DIAGNOSIS — I493 Ventricular premature depolarization: Secondary | ICD-10-CM

## 2015-04-01 MED ORDER — METOPROLOL SUCCINATE ER 25 MG PO TB24: 25.0000 mg | ORAL_TABLET | Freq: Every day | ORAL | Status: DC

## 2015-04-01 NOTE — Progress Notes (Signed)
Cardiology Office Note   Date:  04/01/2015   ID:  [name], DOB 02-Oct-1950, MRN [id number]  PCP:  Tivis Ringer, MD  Cardiologist:   Thayer Headings, MD   Chief Complaint  Patient presents with  . Palpitations   Problem list 1. Palpitations    History of Present Illness: [name] is a 65 y.o. female who presents for evaluation of some chest pain  Several months  Difficulty breathing  Associated with palpitations . Felt like she could not get a breath.  Occurs sporatically , not associated with exercise .  Episodes of palpitations last several minutes.   No regular exercise but is able to do her normal activities without any problems   Has not been sleeping well.  Has some orthopedic issues that wake her up at night .   Oct. 25, 2016: Still having palps. Event mointor shows occasional PVCs. We gave propranolol - takes 20 mg with some relief. Takes 2 every day   Jan. 30, 2017 Doing well.   We changed her from propranolol to Metoprolol succinnate.    Doing great.  No dizziness,   Able to do all of her normal activities.    Past Medical History  Diagnosis Date  . GERD (gastroesophageal reflux disease)   . IBS (irritable bowel syndrome)   . Mental disorder     depression  . Migraines   . Abnormal Pap smear     hx of colpo and cryo  . Thyroid disease   . Herpes   . Insomnia   . Depression   . Ulcer   . Arthritis     osteoarthritis  . Endometriosis   . Fibromyalgia     muscle spasms, joint pain triggered by stress  . History of blood transfusion 75    [place]  . Arthritis   . Osteopenia   . Meniscus tear     Right knee  . Heart murmur   . Hypothyroidism   . Anxiety   . Chest pain   . SOB (shortness of breath)   . Palpitations   . Diaphoresis     Past Surgical History  Procedure Laterality Date  . Cervical fusion  2002    x2  . Elbow surgery    . Tonsilectomy, adenoidectomy, bilateral myringotomy and tubes    . Appendectomy       age 49  . Laparoscopy      age 89  . Cataract extraction Bilateral 2012  . Cholecystectomy  2003  . Abdominal hysterectomy  1990  . Bartholin cyst marsupialization Right 07/05/2012    Procedure: BARTHOLIN CYST MARSUPIALIZATION;  Surgeon: Arloa Koh, MD;  Location: [facility];  Service: Gynecology;  Laterality: Right;  Excision of right Bartholin Gland  . Knee surgery Right 07/2012    menicus tear repair  . Gynecologic cryosurgery    . Colposcopy w/ biopsy / curettage      30 years ago  . Bunionectomy      left foot   . Total knee arthroplasty Right 12/11/2013    Procedure: RIGHT TOTAL KNEE ARTHROPLASTY;  Surgeon: Gearlean Alf, MD;  Location: [facility];  Service: Orthopedics;  Laterality: Right;     Current Outpatient Prescriptions  Medication Sig Dispense Refill  . acetaminophen (TYLENOL) 500 MG tablet Take 1,000 mg by mouth once as needed for mild pain or headache.    . ARIPiprazole (ABILIFY) 5 MG tablet Take 2.5 mg by mouth at bedtime.    Marland Kitchen  cyclobenzaprine (FLEXERIL) 10 MG tablet Take 10 mg by mouth at bedtime.     Marland Kitchen denosumab (PROLIA) 60 MG/ML SOLN injection Inject 60 mg into the skin every 6 (six) months. Administer in upper arm, thigh, or abdomen    . dexlansoprazole (DEXILANT) 60 MG capsule Take 60 mg by mouth every morning. Takes with a glass of water.    [name] Calcium (STOOL SOFTENER PO) Take 3 tablets by mouth 2 (two) times daily.    . DULoxetine (CYMBALTA) 60 MG capsule Take 60 mg by mouth every morning.     Marland Kitchen estradiol (ESTRACE) 0.5 MG tablet Take 1 tablet (0.5 mg total) by mouth daily. 90 tablet 3  . Estradiol 10 MCG TABS vaginal tablet Place 1 tablet (10 mcg total) vaginally 2 (two) times a week. 8 tablet 11  . gabapentin (NEURONTIN) 600 MG tablet Take 600 mg by mouth daily.    Marland Kitchen ipratropium (ATROVENT) 0.03 % nasal spray Place 2 sprays into both nostrils daily.    Marland Kitchen levothyroxine (SYNTHROID, LEVOTHROID) 125 MCG tablet Take 125 mcg by mouth daily before breakfast.  One hour before meal.    . Linaclotide (LINZESS) 290 MCG CAPS Take 1 capsule by mouth every morning. Takes with a glass of water.    Marland Kitchen LORazepam (ATIVAN) 1 MG tablet Take 2 mg by mouth at bedtime.    Marland Kitchen MAGNESIUM PO Take 3-4 tablets by mouth 2 (two) times daily. 3 tablets in the am and 4 tablets at night    . methocarbamol (ROBAXIN) 500 MG tablet Take 1 tablet (500 mg total) by mouth every 8 (eight) hours as needed for muscle spasms. 80 tablet 0  . metoprolol succinate (TOPROL XL) 25 MG 24 hr tablet Take 1 tablet (25 mg total) by mouth daily. 30 tablet 6  . OVER THE COUNTER MEDICATION Take 1 capsule by mouth daily. Eye health capsule daily- l    . Polyethyl Glycol-Propyl Glycol (SYSTANE OP) Place 1 drop into both eyes 2 (two) times daily.    . propranolol (INDERAL) 10 MG tablet Take 1 tablet (10 mg total) by mouth 4 (four) times daily as needed (as needed for fast heart rate and/or palpitations). 60 tablet 6  . SUMAtriptan (IMITREX) 6 MG/0.5ML SOLN injection Inject 6 mg into the skin every 2 (two) hours as needed for migraine or headache. F    . terconazole (TERAZOL 7) 0.4 % vaginal cream Place 1 applicator vaginally at bedtime. 45 g 0  . topiramate (TOPAMAX) 50 MG tablet Take 150 mg by mouth at bedtime.     . valACYclovir (VALTREX) 500 MG tablet Take 1 tablet (500 mg total) by mouth daily. 90 tablet 3   No current facility-administered medications for this visit.    Allergies:   Codeine; Nsaids; Sulfa antibiotics; and Sulfamethoxazole    Social History:  The patient  reports that she has never smoked. She has never used smokeless tobacco. She reports that she drinks about 7.2 - 8.4 oz of alcohol per week. She reports that she does not use illicit drugs.   Family History:  The patient's family history includes Alcohol abuse in her brother and father; Anxiety disorder in her brother; Cancer in her father; Colon cancer in her father; Dementia in her mother; Depression in her brother; Heart disease  in her father; Hypertension in her father and mother; Insomnia in her brother; Kidney failure in her father; Osteoporosis in her mother; Rheum arthritis in her mother; Stroke in her mother.  ROS:  Please see the history of present illness.    Review of Systems: Constitutional:  denies fever, chills, diaphoresis, appetite change and fatigue.  HEENT: denies photophobia, eye pain, redness, hearing loss, ear pain, congestion, sore throat, rhinorrhea, sneezing, neck pain, neck stiffness and tinnitus.  Respiratory: denies SOB, DOE, cough, chest tightness, and wheezing.  Cardiovascular: admits to  palpitations and leg swelling. No chest pain   Gastrointestinal: denies nausea, vomiting, abdominal pain, diarrhea, constipation, blood in stool.  Genitourinary: denies dysuria, urgency, frequency, hematuria, flank pain and difficulty urinating.  Musculoskeletal: denies  myalgias, back pain, joint swelling, arthralgias and gait problem.   Skin: denies pallor, rash and wound.  Neurological: denies dizziness, seizures, syncope, weakness, light-headedness, numbness and headaches.   Hematological: denies adenopathy, easy bruising, personal or family bleeding history.  Psychiatric/ Behavioral: denies suicidal ideation, mood changes, confusion, nervousness, sleep disturbance and agitation.       All other systems are reviewed and negative.    PHYSICAL EXAM: VS:  BP 90/50 mmHg  Pulse 80  Ht 5\' 5"  (1.651 m)  Wt 136 lb (61.689 kg)  BMI 22.63 kg/m2  LMP 03/02/1988 , BMI Body mass index is 22.63 kg/(m^2). GEN: Well nourished, well developed, in no acute distress HEENT: normal Neck: no JVD, carotid bruits, or masses Cardiac: RRR; no murmurs, rubs, or gallops,no edema  Respiratory:  clear to auscultation bilaterally, normal work of breathing GI: soft, nontender, nondistended, + BS MS: no deformity or atrophy Skin: warm and dry, no rash Neuro:  Strength and sensation are intact Psych: normal   EKG:   EKG is ordered today. The ekg ordered today demonstrates NSR at 84.  Inc. RBBB    Recent Labs: 11/26/2014: BUN 20; Creatinine, Ser 1.01; Potassium 4.0; Sodium 139    Lipid Panel No results found for: CHOL, TRIG, HDL, CHOLHDL, VLDL, LDLCALC, LDLDIRECT    Wt Readings from Last 3 Encounters:  04/01/15 136 lb (61.689 kg)  12/25/14 133 lb 1.9 oz (60.383 kg)  11/08/14 131 lb (59.421 kg)      Other studies Reviewed: Additional studies/ records that were reviewed today include: . Review of the above records demonstrates:    ASSESSMENT AND PLAN:  1.  Palpitations:  She has normal left ventricle systolic function by echocardiogram at Dr. Danna Hefty  Office.   She is tolerating the  Toprol XL.  Continue same meds.   I'll see her in 1 year.    Current medicines are reviewed at length with the patient today.  The patient does not have concerns regarding medicines.  The following changes have been made:  no change  Labs/ tests ordered today include:  No orders of the defined types were placed in this encounter.     Disposition:   FU with me in  3 months      [name], Wonda Cheng, MD  04/01/2015 3:40 PM    [facility] [place], [place], [place]  [address] Phone: [phone]; Fax: [phone]   [facility]  [address] [place], [place]  [address] [phone]   Fax [phone]

## 2015-04-01 NOTE — Patient Instructions (Signed)
Medication Instructions:  Your physician recommends that you continue on your current medications as directed. Please refer to the Current Medication list given to you today.   Labwork: None Ordered   Testing/Procedures: None Ordered   Follow-Up: Your physician wants you to follow-up in: 1 year with Dr. Nahser.  You will receive a reminder letter in the mail two months in advance. If you don't receive a letter, please call our office to schedule the follow-up appointment.   If you need a refill on your cardiac medications before your next appointment, please call your pharmacy.   Thank you for choosing [facility]! Chanique Duca, RN [phone]    

## 2015-04-09 DIAGNOSIS — K5900 Constipation, unspecified: Secondary | ICD-10-CM | POA: Diagnosis not present

## 2015-04-09 DIAGNOSIS — K219 Gastro-esophageal reflux disease without esophagitis: Secondary | ICD-10-CM | POA: Diagnosis not present

## 2015-04-09 DIAGNOSIS — Z86010 Personal history of colonic polyps: Secondary | ICD-10-CM | POA: Diagnosis not present

## 2015-04-11 ENCOUNTER — Ambulatory Visit: Payer: Medicare Other | Admitting: Obstetrics and Gynecology

## 2015-04-24 ENCOUNTER — Ambulatory Visit (INDEPENDENT_AMBULATORY_CARE_PROVIDER_SITE_OTHER): Payer: Medicare Other | Admitting: Obstetrics and Gynecology

## 2015-04-24 ENCOUNTER — Encounter: Payer: Self-pay | Admitting: Obstetrics and Gynecology

## 2015-04-24 VITALS — BP 122/68 | HR 66 | Resp 18 | Ht 65.0 in | Wt 136.2 lb

## 2015-04-24 DIAGNOSIS — R6882 Decreased libido: Secondary | ICD-10-CM

## 2015-04-24 DIAGNOSIS — Z124 Encounter for screening for malignant neoplasm of cervix: Secondary | ICD-10-CM

## 2015-04-24 DIAGNOSIS — Z01419 Encounter for gynecological examination (general) (routine) without abnormal findings: Secondary | ICD-10-CM

## 2015-04-24 MED ORDER — NONFORMULARY OR COMPOUNDED ITEM: Status: DC

## 2015-04-24 MED ORDER — ESTRADIOL 10 MCG VA TABS: 1.0000 | ORAL_TABLET | VAGINAL | Status: DC

## 2015-04-24 MED ORDER — ESTRADIOL 0.5 MG PO TABS: 0.5000 mg | ORAL_TABLET | Freq: Every day | ORAL | Status: DC

## 2015-04-24 NOTE — Patient Instructions (Signed)

## 2015-04-24 NOTE — Progress Notes (Signed)
Patient ID: [name], female   DOB: 03-Mar-1950, 64 y.o.   MRN: [id number] 65 y.o. G71P0010 Married Caucasian female here for annual exam.    Patient on oral and vaginal estrogen.  Decreased sex drive. No dyspareunia.  Patient has a mole above pubic area and on her head she would like [place] to look at.  Sees Dr. Jerrell Belfast.  Hx of abnormal moles removed from face in past.   Has gained 10 - 15 pounds. Taking Gabapentin instead of Topamax. Still dealing with migraines.   Just back from [place].   PCP:   Berneta Sages, MD  Patient's last menstrual period was 03/02/1988.          Sexually active: Yes.   female The current method of family planning is status post hysterectomy.    Exercising: Yes.    works out on treadmill 3days/week for 20 minutes. Smoker:  no  Health Maintenance: Pap:  2010 normal History of abnormal Pap:  Yes, 1985 hx colposcopy and cryotherapy to cervix.  Paps normal since. MMG:  05-30-14 3D/Density Cat.C/Neg/BiRads1:[facility] Colonoscopy:  2014 polyps with Dr. Lenise Herald due 2019. BMD:   2016??Result:  Osteoporosis in one hip(unsure which) with Dr. Jones Skene had 3 Prolia injections ordered by Dr. Dagmar Hait. TDaP:  2013 Screening Labs:  Hb today: PCP, Urine today: PCP   reports that she has never smoked. She has never used smokeless tobacco. She reports that she drinks about 7.2 - 8.4 oz of alcohol per week. She reports that she does not use illicit drugs.  Past Medical History  Diagnosis Date  . GERD (gastroesophageal reflux disease)   . IBS (irritable bowel syndrome)   . Mental disorder     depression  . Migraines   . Abnormal Pap smear     hx of colpo and cryo  . Thyroid disease   . Herpes   . Insomnia   . Depression   . Ulcer   . Arthritis     osteoarthritis  . Endometriosis   . Fibromyalgia     muscle spasms, joint pain triggered by stress  . History of blood transfusion 77    [place]  . Arthritis   . Osteopenia   . Meniscus tear     Right knee  . Heart  murmur   . Hypothyroidism   . Anxiety   . Chest pain   . SOB (shortness of breath)   . Palpitations   . Diaphoresis   . Osteoporosis 2016    began Prolia injections with Dr. Dagmar Hait 05/2014?    Past Surgical History  Procedure Laterality Date  . Cervical fusion  2002    x2  . Elbow surgery    . Tonsilectomy, adenoidectomy, bilateral myringotomy and tubes    . Appendectomy      age 37  . Laparoscopy      age 43  . Cataract extraction Bilateral 2012  . Cholecystectomy  2003  . Abdominal hysterectomy  1990  . Bartholin cyst marsupialization Right 07/05/2012    Procedure: BARTHOLIN CYST MARSUPIALIZATION;  Surgeon: Arloa Koh, MD;  Location: [facility];  Service: Gynecology;  Laterality: Right;  Excision of right Bartholin Gland  . Knee surgery Right 07/2012    menicus tear repair  . Gynecologic cryosurgery    . Colposcopy w/ biopsy / curettage      30 years ago  . Bunionectomy      left foot   . Total knee arthroplasty Right 12/11/2013  Procedure: RIGHT TOTAL KNEE ARTHROPLASTY;  Surgeon: Gearlean Alf, MD;  Location: [facility];  Service: Orthopedics;  Laterality: Right;    Current Outpatient Prescriptions  Medication Sig Dispense Refill  . acetaminophen (TYLENOL) 500 MG tablet Take 1,000 mg by mouth once as needed for mild pain or headache.    . ARIPiprazole (ABILIFY) 5 MG tablet Take 2.5 mg by mouth at bedtime.    . cyclobenzaprine (FLEXERIL) 10 MG tablet Take 10 mg by mouth at bedtime.     Marland Kitchen denosumab (PROLIA) 60 MG/ML SOLN injection Inject 60 mg into the skin every 6 (six) months. Administer in upper arm, thigh, or abdomen    . dexlansoprazole (DEXILANT) 60 MG capsule Take 60 mg by mouth every morning. Takes with a glass of water.    [name] Calcium (STOOL SOFTENER PO) Take 3 tablets by mouth 2 (two) times daily.    . DULoxetine (CYMBALTA) 60 MG capsule Take 60 mg by mouth every morning.     Marland Kitchen estradiol (ESTRACE) 0.5 MG tablet Take 1 tablet (0.5 mg total) by mouth daily. 90  tablet 3  . Estradiol 10 MCG TABS vaginal tablet Place 1 tablet (10 mcg total) vaginally 2 (two) times a week. 8 tablet 11  . ipratropium (ATROVENT) 0.03 % nasal spray Place 2 sprays into both nostrils daily.    Marland Kitchen levothyroxine (SYNTHROID, LEVOTHROID) 125 MCG tablet Take 125 mcg by mouth daily before breakfast. One hour before meal.    . Linaclotide (LINZESS) 290 MCG CAPS Take 1 capsule by mouth every morning. Takes with a glass of water.    Marland Kitchen LORazepam (ATIVAN) 1 MG tablet Take 2 mg by mouth at bedtime.    Marland Kitchen MAGNESIUM PO Take 3-4 tablets by mouth 2 (two) times daily. 3 tablets in the am and 4 tablets at night    . methocarbamol (ROBAXIN) 500 MG tablet Take 1 tablet (500 mg total) by mouth every 8 (eight) hours as needed for muscle spasms. 80 tablet 0  . metoprolol succinate (TOPROL XL) 25 MG 24 hr tablet Take 1 tablet (25 mg total) by mouth daily. 90 tablet 3  . OVER THE COUNTER MEDICATION Take 1 capsule by mouth daily. Eye health capsule daily- l    . Polyethyl Glycol-Propyl Glycol (SYSTANE OP) Place 1 drop into both eyes 2 (two) times daily.    . propranolol (INDERAL) 10 MG tablet Take 1 tablet (10 mg total) by mouth 4 (four) times daily as needed (as needed for fast heart rate and/or palpitations). 60 tablet 6  . SUMAtriptan (IMITREX) 6 MG/0.5ML SOLN injection Inject 6 mg into the skin every 2 (two) hours as needed for migraine or headache. F    . terconazole (TERAZOL 7) 0.4 % vaginal cream Place 1 applicator vaginally at bedtime. 45 g 0  . valACYclovir (VALTREX) 500 MG tablet Take 1 tablet (500 mg total) by mouth daily. 90 tablet 3  . gabapentin (NEURONTIN) 300 MG capsule Take 2 capsules by mouth daily.     No current facility-administered medications for this visit.    Family History  Problem Relation Age of Onset  . Colon cancer Father   . Heart disease Father   . Kidney failure Father   . Hypertension Father   . Alcohol abuse Father   . Stroke Mother   . Osteoporosis Mother   .  Rheum arthritis Mother   . Dementia Mother   . Hypertension Mother   . Anxiety disorder Brother   . Insomnia  Brother   . Depression Brother   . Alcohol abuse Brother   . Cancer Father     ROS:  Pertinent items are noted in HPI.  Otherwise, a comprehensive ROS was negative.  Exam:   BP 122/68 mmHg  Pulse 66  Resp 18  Ht 5\' 5"  (1.651 m)  Wt 136 lb 3.2 oz (61.78 kg)  BMI 22.66 kg/m2  LMP 03/02/1988    General appearance: alert, cooperative and appears stated age Head: Normocephalic, without obvious abnormality, atraumatic Neck: no adenopathy, supple, symmetrical, trachea midline and thyroid normal to inspection and palpation Lungs: clear to auscultation bilaterally Breasts: normal appearance, no masses or tenderness, Inspection negative, No nipple retraction or dimpling, No nipple discharge or bleeding, No axillary or supraclavicular adenopathy Heart: regular rate and rhythm Abdomen: soft, non-tender; bowel sounds normal; no masses,  no organomegaly Extremities: extremities normal, atraumatic, no cyanosis or edema Skin: Skin color, texture, turgor normal.  Dry flaking raised nevus of scalp and left temporal region. Lymph nodes: Cervical, supraclavicular, and axillary nodes normal. No abnormal inguinal nodes palpated Neurologic: Grossly normal  Pelvic: External genitalia:  Lower abdominal raised dry pigmented nevus.              Urethra:  normal appearing urethra with no masses, tenderness or lesions              Bartholins and Skenes: normal                 Vagina: normal appearing vagina with normal color and discharge, no lesions              Cervix: absent              Pap taken: No. Bimanual Exam:  Uterus:  uterus absent              Adnexa: no mass, fullness, tenderness              Rectovaginal: Yes.  .  Confirms.              Anus:  normal sphincter tone, no lesions  Chaperone was present for exam.  Assessment:   Well woman visit with normal exam. Status post  TAH/BSO. Osteoporosis.  Prolia with PCP.  Menopausal symptoms.  On vaginal and oral estrogen.  Decreased libido.  [place].    Plan: Yearly mammogram recommended after age 17.  Recommended self breast exam.  Pap and HR HPV as above. Discussed Calcium, Vitamin D, regular exercise program including cardiovascular and weight bearing exercise. Labs performed.  Yes.  .   See orders.  Testosterone level check.  Refills given on medications.  Yes.  .  See orders.  Estrace po and Vagifem refills.  Discussed risks of DVT, PE, MI, stroke, breast cancer.  Testosterone propionate 2% in petrolatum.  Apply bid.  Instructions given for usage.  Discussed risks and benefits.  Follow up in 6 weeks for a recheck appt and lab check of testosterone level. Patient will follow up with her dermatologist.  Follow up annually and prn.   10 minutes of additional counseling given regarding decreased libido.  Over 50% was spent in counseling.   After visit summary provided.

## 2015-04-25 LAB — TESTOSTERONE, FREE, TOTAL, SHBG
Sex Hormone Binding: 149 nmol/L — ABNORMAL HIGH (ref 14–73)
Testosterone: <20 ng/dL

## 2015-05-01 DIAGNOSIS — M5136 Other intervertebral disc degeneration, lumbar region: Secondary | ICD-10-CM | POA: Diagnosis not present

## 2015-05-01 DIAGNOSIS — M4726 Other spondylosis with radiculopathy, lumbar region: Secondary | ICD-10-CM | POA: Diagnosis not present

## 2015-05-01 DIAGNOSIS — M545 Low back pain: Secondary | ICD-10-CM | POA: Diagnosis not present

## 2015-05-03 DIAGNOSIS — Z96651 Presence of right artificial knee joint: Secondary | ICD-10-CM | POA: Diagnosis not present

## 2015-05-03 DIAGNOSIS — S8001XA Contusion of right knee, initial encounter: Secondary | ICD-10-CM | POA: Diagnosis not present

## 2015-05-04 ENCOUNTER — Other Ambulatory Visit: Payer: Self-pay | Admitting: Obstetrics and Gynecology

## 2015-05-06 NOTE — Telephone Encounter (Signed)
Medication refill request: Valtrex Last AEX:  04-24-15 Next AEX: not scheduled Last MMG (if hormonal medication request): 05-30-14 WNL Refill authorized: please advise

## 2015-05-13 DIAGNOSIS — G43019 Migraine without aura, intractable, without status migrainosus: Secondary | ICD-10-CM | POA: Diagnosis not present

## 2015-05-13 DIAGNOSIS — G43719 Chronic migraine without aura, intractable, without status migrainosus: Secondary | ICD-10-CM | POA: Diagnosis not present

## 2015-05-16 DIAGNOSIS — M47817 Spondylosis without myelopathy or radiculopathy, lumbosacral region: Secondary | ICD-10-CM | POA: Diagnosis not present

## 2015-05-20 DIAGNOSIS — M545 Low back pain: Secondary | ICD-10-CM | POA: Diagnosis not present

## 2015-05-20 DIAGNOSIS — M25512 Pain in left shoulder: Secondary | ICD-10-CM | POA: Diagnosis not present

## 2015-05-20 DIAGNOSIS — M797 Fibromyalgia: Secondary | ICD-10-CM | POA: Diagnosis not present

## 2015-05-20 DIAGNOSIS — G4709 Other insomnia: Secondary | ICD-10-CM | POA: Diagnosis not present

## 2015-06-04 DIAGNOSIS — S8001XD Contusion of right knee, subsequent encounter: Secondary | ICD-10-CM | POA: Diagnosis not present

## 2015-06-11 DIAGNOSIS — Z1231 Encounter for screening mammogram for malignant neoplasm of breast: Secondary | ICD-10-CM | POA: Diagnosis not present

## 2015-06-13 ENCOUNTER — Encounter: Payer: Self-pay | Admitting: Obstetrics and Gynecology

## 2015-06-13 ENCOUNTER — Ambulatory Visit (INDEPENDENT_AMBULATORY_CARE_PROVIDER_SITE_OTHER): Payer: Medicare Other | Admitting: Obstetrics and Gynecology

## 2015-06-13 VITALS — BP 104/62 | HR 76 | Ht 65.0 in | Wt 135.0 lb

## 2015-06-13 DIAGNOSIS — N951 Menopausal and female climacteric states: Secondary | ICD-10-CM

## 2015-06-13 DIAGNOSIS — R6882 Decreased libido: Secondary | ICD-10-CM | POA: Diagnosis not present

## 2015-06-13 NOTE — Patient Instructions (Signed)
Estradiol vaginal ring (Femring) What is this medicine? ESTRADIOL (es tra DYE ole) vaginal ring is an insert that contains a female hormone. This medicine helps relieve symptoms of vaginal irritation and dryness that occurs in some women during menopause. This medicine can also help relieve hot flashes. This medicine may be used for other purposes; ask your health care provider or pharmacist if you have questions. What should I tell my health care provider before I take this medicine? They need to know if you have any of these conditions: -abnormal vaginal bleeding -blood vessel disease or blood clots -breast, cervical, endometrial, ovarian, liver, or uterine cancer -dementia -diabetes -gallbladder disease -heart disease or recent heart attack -high blood pressure -high cholesterol -high level of calcium in the blood -hysterectomy -kidney disease -liver disease -migraine headaches -protein C deficiency -protein S deficiency -stroke -systemic lupus erythematosus (SLE) -tobacco smoker -an unusual or allergic reaction to estrogens, other hormones, medicines, foods, dyes, or preservatives -pregnant or trying to get pregnant -breast-feeding How should I use this medicine? This medicine may be inserted by you or your physician. Follow the directions that are included with your prescription. If you are unsure how to insert the ring, contact your doctor or health care professional. The vaginal ring should remain in place for 90 days. After 90 days you should replace your old ring and insert a new one. Do not stop using except on the advice of your doctor or health care professional. Contact your pediatrician regarding the use of this medicine in children. Special care may be needed. A patient package insert for the product will be given with each prescription and refill. Read this sheet carefully each time. The sheet may change frequently. Overdosage: If you think you have taken too much of  this medicine contact a poison control center or emergency room at once. NOTE: This medicine is only for you. Do not share this medicine with others. What if I miss a dose? If you miss a dose, use it as soon as you can. If it is almost time for your next dose, use only that dose. Do not use double or extra doses. What may interact with this medicine? Do not take this medicine with any of the following medications: -aromatase inhibitors like aminoglutethimide, anastrozole, exemestane, letrozole, testolactone, vorozole This medicine may also interact with the following medications: -carbamazepine -certain antibiotics used to treat infections -certain barbiturates used for inducing sleep or treating seizures -grapefruit juice -medicines for fungus infections like itraconazole and ketoconazole -raloxifene or tamoxifen -rifabutin, rifampin, or rifapentine -ritonavir -St. John's Wort This list may not describe all possible interactions. Give your health care provider a list of all the medicines, herbs, non-prescription drugs, or dietary supplements you use. Also tell them if you smoke, drink alcohol, or use illegal drugs. Some items may interact with your medicine. What should I watch for while using this medicine? Visit your doctor or health care professional for regular checks on your progress. You will need a regular breast and pelvic exam and Pap smear while on this medicine. You should also discuss the need for regular mammograms with your health care professional, and follow his or her guidelines for these tests. This medicine can make your body retain fluid, making your fingers, hands, or ankles swell. Your blood pressure can go up. Contact your doctor or health care professional if you feel you are retaining fluid. If you have any reason to think you are pregnant, stop taking this medicine right away and contact  your doctor or health care professional. Smoking increases the risk of getting a  blood clot or having a stroke while you are taking this medicine, especially if you are more than 67 years old. You are strongly advised not to smoke. If you wear contact lenses and notice visual changes, or if the lenses begin to feel uncomfortable, consult your eye doctor or health care professional. This medicine can increase the risk of developing a condition (endometrial hyperplasia) that may lead to cancer of the lining of the uterus. Taking progestins, another hormone drug, with this medicine lowers the risk of developing this condition. Therefore, if your uterus has not been removed (by a hysterectomy), your doctor may prescribe a progestin for you to take together with your estrogen. You should know, however, that taking estrogens with progestins may have additional health risks. You should discuss the use of estrogens and progestins with your health care professional to determine the benefits and risks for you. If you are going to have surgery, you may need to stop taking this medicine. Consult your health care professional for advice before you schedule the surgery. You may bathe or participate in other activities while using this medicine. You do not need to remove the vaginal ring during sexual or other activities unless you are more comfortable doing so. Within the 90-day dosage period, you may remove the vaginal ring, rinse it with clean lukewarm (not hot or boiling) water, and re-insert the ring as needed. What side effects may I notice from receiving this medicine? Side effects that you should report to your doctor or health care professional as soon as possible: -allergic reactions like skin rash, itching or hives, swelling of the face, lips, or tongue -breast tissue changes or discharge -signs and symptoms of a blood clot such as breathing problems; changes in vision; chest pain; severe, sudden headache; pain, swelling, warmth in the leg; trouble speaking; sudden numbness or weakness of  the face, arm or leg -signs and symptoms of infection like fever or chills; vomiting; diarrhea; muscle pain; dizziness; or a red, sunburn-like rash on face and body -signs and symptoms of liver injury like dark yellow or brown urine; general ill feeling or flu-like symptoms; light-colored stools; loss of appetite; nausea; right upper belly pain; unusually weak or tired; yellowing of the eyes or skin -symptoms of bowel blockage like constipation, abdominal swelling, abdominal pain, inability to pass gas or have a bowel movement -symptoms of vaginal infection like itching, irritation or unusual discharge -unusual or increased vaginal bleeding -vaginal pain or soreness, redness, swelling Side effects that usually do not require medical attention (report to your doctor or health care professional if they continue or are bothersome): -breast tenderness -fluid retention -hair loss -headache -nausea -upset stomach -vaginal spotting This list may not describe all possible side effects. Call your doctor for medical advice about side effects. You may report side effects to FDA at 1-800-FDA-1088. Where should I keep my medicine? Keep out of the reach of children. Store at room temperature between 15 and 30 degrees C (59 and 86 degrees F). Throw away any unused medicine after the expiration date. NOTE: This sheet is a summary. It may not cover all possible information. If you have questions about this medicine, talk to your doctor, pharmacist, or health care provider.    2016, Elsevier/Gold Standard. (2013-12-11 15:22:23)

## 2015-06-13 NOTE — Progress Notes (Signed)
Patient ID: [name], female   DOB: 01-24-1951, 65 y.o.   MRN: [id number]  GYNECOLOGY  VISIT   HPI: 65 y.o.   Married  Caucasian  female   G1P0010 with Patient's last menstrual period was 03/02/1988 (approximate).   here for follow up of menopause symptoms.  Has decreased libido. Testosterone level was low 2 months ago.  Using testosterone propionate 2% in petrolatum. Applied bid initially and and then now on once a day.  No real change in libido.  Notes less hair growth only her upper lip.   On oral estradiol 0.5 mg daily and using vaginal estrogen.  Having night sweats.   Dr. Jaynie Bream office has provided a glucometer for her to check her blood sugar.  She may be having episodes of hypoglycemia.   GYNECOLOGIC HISTORY: Patient's last menstrual period was 03/02/1988 (approximate). Contraception:  Hysterectomy Menopausal hormone therapy:  Estradiol and Vagifem Last mammogram:  06/11/15, [facility] Last pap smear:   2010, Normal        OB History    Gravida Para Term Preterm AB TAB SAB Ectopic Multiple Living   1 0   1  1   0         Patient Active Problem List   Diagnosis Date Noted  . PVC (premature ventricular contraction) 11/08/2014  . OA (osteoarthritis) of knee 12/11/2013  . Vaginal atrophy 08/05/2011  . Arthritis 10/08/2010  . GERD (gastroesophageal reflux disease) 10/08/2010  . IBS (irritable bowel syndrome) 10/08/2010  . Depression 10/08/2010  . Fibromyalgia 10/08/2010  . Migraines 10/08/2010    Past Medical History  Diagnosis Date  . GERD (gastroesophageal reflux disease)   . IBS (irritable bowel syndrome)   . Mental disorder     depression  . Migraines   . Abnormal Pap smear     hx of colpo and cryo  . Thyroid disease   . Herpes   . Insomnia   . Depression   . Ulcer   . Arthritis     osteoarthritis  . Endometriosis   . Fibromyalgia     muscle spasms, joint pain triggered by stress  . History of blood transfusion 77    [place]  . Arthritis    . Osteopenia   . Meniscus tear     Right knee  . Heart murmur   . Hypothyroidism   . Anxiety   . Chest pain   . SOB (shortness of breath)   . Palpitations   . Diaphoresis   . Osteoporosis 2016    began Prolia injections with Dr. Dagmar Hait 05/2014?    Past Surgical History  Procedure Laterality Date  . Cervical fusion  2002    x2  . Elbow surgery    . Tonsilectomy, adenoidectomy, bilateral myringotomy and tubes    . Appendectomy      age 62  . Laparoscopy      age 1  . Cataract extraction Bilateral 2012  . Cholecystectomy  2003  . Abdominal hysterectomy  1990  . Bartholin cyst marsupialization Right 07/05/2012    Procedure: BARTHOLIN CYST MARSUPIALIZATION;  Surgeon: Arloa Koh, MD;  Location: [facility];  Service: Gynecology;  Laterality: Right;  Excision of right Bartholin Gland  . Knee surgery Right 07/2012    menicus tear repair  . Gynecologic cryosurgery    . Colposcopy w/ biopsy / curettage      30 years ago  . Bunionectomy      left foot   . Total  knee arthroplasty Right 12/11/2013    Procedure: RIGHT TOTAL KNEE ARTHROPLASTY;  Surgeon: Gearlean Alf, MD;  Location: [facility];  Service: Orthopedics;  Laterality: Right;    Current Outpatient Prescriptions  Medication Sig Dispense Refill  . acetaminophen (TYLENOL) 500 MG tablet Take 1,000 mg by mouth once as needed for mild pain or headache.    . ARIPiprazole (ABILIFY) 5 MG tablet Take 2.5 mg by mouth at bedtime.    . cyclobenzaprine (FLEXERIL) 10 MG tablet Take 10 mg by mouth at bedtime.     Marland Kitchen denosumab (PROLIA) 60 MG/ML SOLN injection Inject 60 mg into the skin every 6 (six) months. Administer in upper arm, thigh, or abdomen    . dexlansoprazole (DEXILANT) 60 MG capsule Take 60 mg by mouth every morning. Takes with a glass of water.    [name] Calcium (STOOL SOFTENER PO) Take 3 tablets by mouth 2 (two) times daily.    . DULoxetine (CYMBALTA) 60 MG capsule Take 60 mg by mouth every morning.     Marland Kitchen estradiol (ESTRACE) 0.5  MG tablet Take 1 tablet (0.5 mg total) by mouth daily. 90 tablet 3  . Estradiol 10 MCG TABS vaginal tablet Place 1 tablet (10 mcg total) vaginally 2 (two) times a week. 8 tablet 11  . ipratropium (ATROVENT) 0.03 % nasal spray Place 2 sprays into both nostrils daily.    Marland Kitchen levothyroxine (SYNTHROID, LEVOTHROID) 125 MCG tablet Take 125 mcg by mouth daily before breakfast. One hour before meal.    . Linaclotide (LINZESS) 290 MCG CAPS Take 1 capsule by mouth every morning. Takes with a glass of water.    Marland Kitchen LORazepam (ATIVAN) 1 MG tablet Take 2 mg by mouth at bedtime.    Marland Kitchen MAGNESIUM PO Take 3-4 tablets by mouth 2 (two) times daily. 3 tablets in the am and 4 tablets at night    . methocarbamol (ROBAXIN) 500 MG tablet Take 1 tablet (500 mg total) by mouth every 8 (eight) hours as needed for muscle spasms. 80 tablet 0  . metoprolol succinate (TOPROL XL) 25 MG 24 hr tablet Take 1 tablet (25 mg total) by mouth daily. 90 tablet 3  . NONFORMULARY OR COMPOUNDED ITEM Testosterone propionate 2% in white petrolatum, apply bid for 6 weeks and then daily as directed.  60 grams. 60 each 0  . OVER THE COUNTER MEDICATION Take 1 capsule by mouth daily. Eye health capsule daily- l    . Polyethyl Glycol-Propyl Glycol (SYSTANE OP) Place 1 drop into both eyes 2 (two) times daily.    . propranolol (INDERAL) 10 MG tablet Take 1 tablet (10 mg total) by mouth 4 (four) times daily as needed (as needed for fast heart rate and/or palpitations). 60 tablet 6  . SUMAtriptan (IMITREX) 6 MG/0.5ML SOLN injection Inject 6 mg into the skin every 2 (two) hours as needed for migraine or headache. F    . valACYclovir (VALTREX) 500 MG tablet TAKE 1 TABLET (500 MG TOTAL) BY MOUTH DAILY. 90 tablet 3   No current facility-administered medications for this visit.     ALLERGIES: Codeine; Nsaids; Sulfa antibiotics; and Sulfamethoxazole  Family History  Problem Relation Age of Onset  . Colon cancer Father   . Heart disease Father   . Kidney  failure Father   . Hypertension Father   . Alcohol abuse Father   . Stroke Mother   . Osteoporosis Mother   . Rheum arthritis Mother   . Dementia Mother   . Hypertension  Mother   . Anxiety disorder Brother   . Insomnia Brother   . Depression Brother   . Alcohol abuse Brother   . Cancer Father     Social History   Social History  . Marital Status: Married    Spouse Name: N/A  . Number of Children: N/A  . Years of Education: N/A   Occupational History  . Not on file.   Social History Main Topics  . Smoking status: Never Smoker   . Smokeless tobacco: Never Used  . Alcohol Use: 7.2 - 8.4 oz/week    12-14 Glasses of wine per week     Comment: 2 glasses of wine at night  . Drug Use: No  . Sexual Activity:    Partners: Male    Birth Control/ Protection: Surgical     Comment: TAH   Other Topics Concern  . Not on file   Social History Narrative    ROS:  Pertinent items are noted in HPI.  PHYSICAL EXAMINATION:    BP 104/62 mmHg  Pulse 76  Ht 5\' 5"  (1.651 m)  Wt 135 lb (61.236 kg)  BMI 22.47 kg/m2  LMP 03/02/1988 (Approximate)    General appearance: alert, cooperative and appears stated age   ASSESSMENT  Menopausal symptoms. Oral estrogen not effective? Decreased libido.  Hx depression.  On Cymbalta and Abilify.   PLAN  Discussion of menopausal symptoms and treatment options - oral, transdermal, and vaginal estrogen.  May consider Femring instead of taking oral estrogen and Vagifem.  Verbal and written information to patient.  Will measure testosterone to titrate her dosage if necessary.  Check estradiol level.    An After Visit Summary was printed and given to the patient.  ___15___ minutes face to face time of which over 50% was spent in counseling.

## 2015-06-14 LAB — ESTRADIOL: Estradiol: 47 pg/mL

## 2015-06-16 ENCOUNTER — Encounter: Payer: Self-pay | Admitting: Obstetrics and Gynecology

## 2015-06-19 LAB — TESTOS,TOTAL,FREE AND SHBG (FEMALE)
Sex Hormone Binding Glob.: 183 nmol/L — ABNORMAL HIGH (ref 14–73)
Testosterone, Free: 3.2 pg/mL (ref 0.1–6.4)
Testosterone,Total,LC/MS/MS: 85 ng/dL — ABNORMAL HIGH (ref 2–45)

## 2015-07-02 DIAGNOSIS — M4726 Other spondylosis with radiculopathy, lumbar region: Secondary | ICD-10-CM | POA: Diagnosis not present

## 2015-07-02 DIAGNOSIS — M5136 Other intervertebral disc degeneration, lumbar region: Secondary | ICD-10-CM | POA: Diagnosis not present

## 2015-07-02 DIAGNOSIS — M545 Low back pain: Secondary | ICD-10-CM | POA: Diagnosis not present

## 2015-07-02 DIAGNOSIS — M546 Pain in thoracic spine: Secondary | ICD-10-CM | POA: Diagnosis not present

## 2015-07-02 DIAGNOSIS — M542 Cervicalgia: Secondary | ICD-10-CM | POA: Diagnosis not present

## 2015-07-04 ENCOUNTER — Telehealth: Payer: Self-pay | Admitting: Obstetrics and Gynecology

## 2015-07-04 NOTE — Telephone Encounter (Signed)
Patient says she is having an allergic reaction to the testerone cream she was prescribed.

## 2015-07-08 ENCOUNTER — Ambulatory Visit (INDEPENDENT_AMBULATORY_CARE_PROVIDER_SITE_OTHER): Payer: Medicare Other | Admitting: Obstetrics and Gynecology

## 2015-07-08 ENCOUNTER — Ambulatory Visit: Payer: Medicare Other | Admitting: Obstetrics and Gynecology

## 2015-07-08 ENCOUNTER — Encounter: Payer: Self-pay | Admitting: Obstetrics and Gynecology

## 2015-07-08 VITALS — BP 110/64 | HR 76 | Ht 65.0 in | Wt 135.2 lb

## 2015-07-08 DIAGNOSIS — N907 Vulvar cyst: Secondary | ICD-10-CM | POA: Diagnosis not present

## 2015-07-08 DIAGNOSIS — Z5181 Encounter for therapeutic drug level monitoring: Secondary | ICD-10-CM

## 2015-07-08 DIAGNOSIS — R6882 Decreased libido: Secondary | ICD-10-CM

## 2015-07-08 DIAGNOSIS — Z79899 Other long term (current) drug therapy: Secondary | ICD-10-CM

## 2015-07-08 DIAGNOSIS — Z79890 Hormone replacement therapy (postmenopausal): Secondary | ICD-10-CM

## 2015-07-08 NOTE — Patient Instructions (Signed)
Epidermal Cyst An epidermal cyst is sometimes called a sebaceous cyst, epidermal inclusion cyst, or infundibular cyst. These cysts usually contain a substance that looks "pasty" or "cheesy" and may have a bad smell. This substance is a protein called keratin. Epidermal cysts are usually found on the face, neck, or trunk. They may also occur in the vaginal area or other parts of the genitalia of both men and women. Epidermal cysts are usually small, painless, slow-growing bumps or lumps that move freely under the skin. It is important not to try to pop them. This may cause an infection and lead to tenderness and swelling. CAUSES  Epidermal cysts may be caused by a deep penetrating injury to the skin or a plugged hair follicle, often associated with acne. SYMPTOMS  Epidermal cysts can become inflamed and cause:  Redness.  Tenderness.  Increased temperature of the skin over the bumps or lumps.  Grayish-white, bad smelling material that drains from the bump or lump. DIAGNOSIS  Epidermal cysts are easily diagnosed by your caregiver during an exam. Rarely, a tissue sample (biopsy) may be taken to rule out other conditions that may resemble epidermal cysts. TREATMENT   Epidermal cysts often get better and disappear on their own. They are rarely ever cancerous.  If a cyst becomes infected, it may become inflamed and tender. This may require opening and draining the cyst. Treatment with antibiotics may be necessary. When the infection is gone, the cyst may be removed with minor surgery.  Small, inflamed cysts can often be treated with antibiotics or by injecting steroid medicines.  Sometimes, epidermal cysts become large and bothersome. If this happens, surgical removal in your caregiver's office may be necessary. HOME CARE INSTRUCTIONS  Only take over-the-counter or prescription medicines as directed by your caregiver.  Take your antibiotics as directed. Finish them even if you start to feel  better. SEEK MEDICAL CARE IF:   Your cyst becomes tender, red, or swollen.  Your condition is not improving or is getting worse.  You have any other questions or concerns. MAKE SURE YOU:  Understand these instructions.  Will watch your condition.  Will get help right away if you are not doing well or get worse.   This information is not intended to replace advice given to you by your health care provider. Make sure you discuss any questions you have with your health care provider.   Document Released: 01/18/2004 Document Revised: 05/11/2011 Document Reviewed: 08/25/2010 Elsevier Interactive Patient Education 2016 Elsevier Inc.  

## 2015-07-08 NOTE — Progress Notes (Signed)
Patient ID: [name], female   DOB: 04-20-1950, 65 y.o.   MRN: [id number] GYNECOLOGY  VISIT   HPI: 65 y.o.   Married  Caucasian  female   G1P0010 with Patient's last menstrual period was 03/02/1988 (approximate).   here for right vulvar cyst which may be a reaction to Testosterone cream.   Patient states that the area drained.  Became painful.  No fever.  Felt tired.   Stopped testosterone daily use when the cyst develops.   States that Femring is not an option for ERT due to cost issues.  GYNECOLOGIC HISTORY: Patient's last menstrual period was 03/02/1988 (approximate). Contraception:  Hysterectomy Menopausal hormone therapy:  Estradiol 0.5mg  daily, Vagifem suppository twice weekly Last mammogram:  06/11/15 3D/Density C/Neg/BiRads1:[facility] Last pap smear:   2010, Normal         OB History    Gravida Para Term Preterm AB TAB SAB Ectopic Multiple Living   1 0   1  1   0         Patient Active Problem List   Diagnosis Date Noted  . PVC (premature ventricular contraction) 11/08/2014  . OA (osteoarthritis) of knee 12/11/2013  . Vaginal atrophy 08/05/2011  . Arthritis 10/08/2010  . GERD (gastroesophageal reflux disease) 10/08/2010  . IBS (irritable bowel syndrome) 10/08/2010  . Depression 10/08/2010  . Fibromyalgia 10/08/2010  . Migraines 10/08/2010    Past Medical History  Diagnosis Date  . GERD (gastroesophageal reflux disease)   . IBS (irritable bowel syndrome)   . Mental disorder     depression  . Migraines   . Abnormal Pap smear     hx of colpo and cryo  . Thyroid disease   . Herpes   . Insomnia   . Depression   . Ulcer   . Arthritis     osteoarthritis  . Endometriosis   . Fibromyalgia     muscle spasms, joint pain triggered by stress  . History of blood transfusion 65    [place]  . Arthritis   . Osteopenia   . Meniscus tear     Right knee  . Heart murmur   . Hypothyroidism   . Anxiety   . Chest pain   . SOB (shortness of breath)   .  Palpitations   . Diaphoresis   . Osteoporosis 2016    began Prolia injections with Dr. Dagmar Hait 05/2014?    Past Surgical History  Procedure Laterality Date  . Cervical fusion  2002    x2  . Elbow surgery    . Tonsilectomy, adenoidectomy, bilateral myringotomy and tubes    . Appendectomy      age 46  . Laparoscopy      age 70  . Cataract extraction Bilateral 2012  . Cholecystectomy  2003  . Abdominal hysterectomy  1990  . Bartholin cyst marsupialization Right 07/05/2012    Procedure: BARTHOLIN CYST MARSUPIALIZATION;  Surgeon: Arloa Koh, MD;  Location: [facility];  Service: Gynecology;  Laterality: Right;  Excision of right Bartholin Gland  . Knee surgery Right 07/2012    menicus tear repair  . Gynecologic cryosurgery    . Colposcopy w/ biopsy / curettage      30 years ago  . Bunionectomy      left foot   . Total knee arthroplasty Right 12/11/2013    Procedure: RIGHT TOTAL KNEE ARTHROPLASTY;  Surgeon: Gearlean Alf, MD;  Location: [facility];  Service: Orthopedics;  Laterality: Right;    Current  Outpatient Prescriptions  Medication Sig Dispense Refill  . acetaminophen (TYLENOL) 500 MG tablet Take 1,000 mg by mouth once as needed for mild pain or headache.    . ARIPiprazole (ABILIFY) 5 MG tablet Take 2.5 mg by mouth at bedtime.    . cyclobenzaprine (FLEXERIL) 10 MG tablet Take 10 mg by mouth at bedtime.     Marland Kitchen denosumab (PROLIA) 60 MG/ML SOLN injection Inject 60 mg into the skin every 6 (six) months. Administer in upper arm, thigh, or abdomen    . Docusate Calcium (STOOL SOFTENER PO) Take 3 tablets by mouth 2 (two) times daily.    . DULoxetine (CYMBALTA) 60 MG capsule Take 60 mg by mouth every morning.     Marland Kitchen esomeprazole (NEXIUM) 20 MG capsule Take 20 mg by mouth daily at 12 noon.    Marland Kitchen estradiol (ESTRACE) 0.5 MG tablet Take 1 tablet (0.5 mg total) by mouth daily. 90 tablet 3  . Estradiol 10 MCG TABS vaginal tablet Place 1 tablet (10 mcg total) vaginally 2 (two) times a week. 8 tablet 11   . ipratropium (ATROVENT) 0.03 % nasal spray Place 2 sprays into both nostrils daily.    Marland Kitchen levothyroxine (SYNTHROID, LEVOTHROID) 125 MCG tablet Take 125 mcg by mouth daily before breakfast. One hour before meal.    . Linaclotide (LINZESS) 290 MCG CAPS Take 1 capsule by mouth every morning. Takes with a glass of water.    Marland Kitchen LORazepam (ATIVAN) 1 MG tablet Take 2 mg by mouth at bedtime.    Marland Kitchen MAGNESIUM PO Take 3-4 tablets by mouth 2 (two) times daily. 3 tablets in the am and 4 tablets at night    . methocarbamol (ROBAXIN) 500 MG tablet Take 1 tablet (500 mg total) by mouth every 8 (eight) hours as needed for muscle spasms. 80 tablet 0  . metoprolol succinate (TOPROL XL) 25 MG 24 hr tablet Take 1 tablet (25 mg total) by mouth daily. 90 tablet 3  . NONFORMULARY OR COMPOUNDED ITEM Testosterone propionate 2% in white petrolatum, apply bid for 6 weeks and then daily as directed.  60 grams. 60 each 0  . OVER THE COUNTER MEDICATION Take 1 capsule by mouth daily. Eye health capsule daily- l    . Polyethyl Glycol-Propyl Glycol (SYSTANE OP) Place 1 drop into both eyes 2 (two) times daily.    . propranolol (INDERAL) 10 MG tablet Take 1 tablet (10 mg total) by mouth 4 (four) times daily as needed (as needed for fast heart rate and/or palpitations). 60 tablet 6  . SUMAtriptan (IMITREX) 6 MG/0.5ML SOLN injection Inject 6 mg into the skin every 2 (two) hours as needed for migraine or headache. F    . valACYclovir (VALTREX) 500 MG tablet TAKE 1 TABLET (500 MG TOTAL) BY MOUTH DAILY. 90 tablet 3   No current facility-administered medications for this visit.     ALLERGIES: Codeine; Nsaids; Sulfa antibiotics; and Sulfamethoxazole  Family History  Problem Relation Age of Onset  . Colon cancer Father   . Heart disease Father   . Kidney failure Father   . Hypertension Father   . Alcohol abuse Father   . Stroke Mother   . Osteoporosis Mother   . Rheum arthritis Mother   . Dementia Mother   . Hypertension Mother    . Anxiety disorder Brother   . Insomnia Brother   . Depression Brother   . Alcohol abuse Brother   . Cancer Father     Social History  Social History  . Marital Status: Married    Spouse Name: N/A  . Number of Children: N/A  . Years of Education: N/A   Occupational History  . Not on file.   Social History Main Topics  . Smoking status: Never Smoker   . Smokeless tobacco: Never Used  . Alcohol Use: 7.2 - 8.4 oz/week    12-14 Glasses of wine per week     Comment: 2 glasses of wine at night  . Drug Use: No  . Sexual Activity:    Partners: Male    Birth Control/ Protection: Surgical     Comment: TAH   Other Topics Concern  . Not on file   Social History Narrative    ROS:  Pertinent items are noted in HPI.  PHYSICAL EXAMINATION:    BP 110/64 mmHg  Pulse 76  Ht 5\' 5"  (1.651 m)  Wt 135 lb 3.2 oz (61.326 kg)  BMI 22.50 kg/m2  LMP 03/02/1988 (Approximate)    General appearance: alert, cooperative and appears stated age  Pelvic: External genitalia:  Right vulva with 5 mm nontender right labia majora sebaceous cyst.              Urethra:  normal appearing urethra with no masses, tenderness or lesions              Bartholins and Skenes: normal          Chaperone was present for exam.  ASSESSMENT  Right vulvar sebaceous cyst.  No evidence of cellulitis. Decreased libido improved with testosterone cream.  PLAN  Discussion of epidermoid cysts.  Discussed avoidance of manipulation to avoid cellulitis.  Offered removal with office procedure for excision.  Procedure explained.  Patient will consider. Ok to resume testosterone cream.  Avoid placing near sebaceous cyst.  Ok to reuce usage to once every other day. Follow up prn.   An After Visit Summary was printed and given to the patient.  __15____ minutes face to face time of which over 50% was spent in counseling.

## 2015-07-08 NOTE — Telephone Encounter (Signed)
Call to patient. Apologized for delay in return call. Office closed due to water emergency in building. Patient states she had red painful irritated bump in outer wall of vagina as a result of testosterone cream. Discontinued cream and it is improved but not resolved.  Office visit scheduled for this afternoon at [facility] to provider for final review. Patient agreeable to disposition. Will close encounter.

## 2015-07-09 ENCOUNTER — Ambulatory Visit (HOSPITAL_COMMUNITY)
Admission: RE | Admit: 2015-07-09 | Discharge: 2015-07-09 | Disposition: A | Discharge status: Home / Self Care | Payer: Medicare Other | Source: Ambulatory Visit | Attending: Internal Medicine | Admitting: Internal Medicine

## 2015-07-09 DIAGNOSIS — M8580 Other specified disorders of bone density and structure, unspecified site: Secondary | ICD-10-CM | POA: Insufficient documentation

## 2015-07-09 MED ORDER — DENOSUMAB 60 MG/ML ~~LOC~~ SOLN
60.0000 mg | Freq: Once | SUBCUTANEOUS | Status: AC
  Administered 2015-07-09: 60 mg via SUBCUTANEOUS
  Filled 2015-07-09: qty 1

## 2015-07-10 ENCOUNTER — Encounter: Payer: Self-pay | Admitting: Obstetrics and Gynecology

## 2015-07-11 DIAGNOSIS — M859 Disorder of bone density and structure, unspecified: Secondary | ICD-10-CM | POA: Diagnosis not present

## 2015-07-11 DIAGNOSIS — D539 Nutritional anemia, unspecified: Secondary | ICD-10-CM | POA: Diagnosis not present

## 2015-07-11 DIAGNOSIS — E784 Other hyperlipidemia: Secondary | ICD-10-CM | POA: Diagnosis not present

## 2015-07-11 DIAGNOSIS — E038 Other specified hypothyroidism: Secondary | ICD-10-CM | POA: Diagnosis not present

## 2015-07-16 DIAGNOSIS — Z471 Aftercare following joint replacement surgery: Secondary | ICD-10-CM | POA: Diagnosis not present

## 2015-07-16 DIAGNOSIS — S8001XD Contusion of right knee, subsequent encounter: Secondary | ICD-10-CM | POA: Diagnosis not present

## 2015-07-16 DIAGNOSIS — Z96651 Presence of right artificial knee joint: Secondary | ICD-10-CM | POA: Diagnosis not present

## 2015-07-17 DIAGNOSIS — R5381 Other malaise: Secondary | ICD-10-CM | POA: Diagnosis not present

## 2015-07-17 DIAGNOSIS — M542 Cervicalgia: Secondary | ICD-10-CM | POA: Diagnosis not present

## 2015-07-17 DIAGNOSIS — M797 Fibromyalgia: Secondary | ICD-10-CM | POA: Diagnosis not present

## 2015-07-17 DIAGNOSIS — M25512 Pain in left shoulder: Secondary | ICD-10-CM | POA: Diagnosis not present

## 2015-08-06 DIAGNOSIS — E038 Other specified hypothyroidism: Secondary | ICD-10-CM | POA: Diagnosis not present

## 2015-08-06 DIAGNOSIS — Z1389 Encounter for screening for other disorder: Secondary | ICD-10-CM | POA: Diagnosis not present

## 2015-08-06 DIAGNOSIS — E784 Other hyperlipidemia: Secondary | ICD-10-CM | POA: Diagnosis not present

## 2015-08-06 DIAGNOSIS — M797 Fibromyalgia: Secondary | ICD-10-CM | POA: Diagnosis not present

## 2015-08-06 DIAGNOSIS — R002 Palpitations: Secondary | ICD-10-CM | POA: Diagnosis not present

## 2015-08-06 DIAGNOSIS — F329 Major depressive disorder, single episode, unspecified: Secondary | ICD-10-CM | POA: Diagnosis not present

## 2015-08-06 DIAGNOSIS — Z0000 Encounter for general adult medical examination without abnormal findings: Secondary | ICD-10-CM | POA: Diagnosis not present

## 2015-08-06 DIAGNOSIS — J309 Allergic rhinitis, unspecified: Secondary | ICD-10-CM | POA: Diagnosis not present

## 2015-08-06 DIAGNOSIS — D539 Nutritional anemia, unspecified: Secondary | ICD-10-CM | POA: Diagnosis not present

## 2015-08-06 DIAGNOSIS — K635 Polyp of colon: Secondary | ICD-10-CM | POA: Diagnosis not present

## 2015-08-06 DIAGNOSIS — K219 Gastro-esophageal reflux disease without esophagitis: Secondary | ICD-10-CM | POA: Diagnosis not present

## 2015-08-06 DIAGNOSIS — M810 Age-related osteoporosis without current pathological fracture: Secondary | ICD-10-CM | POA: Diagnosis not present

## 2015-08-07 DIAGNOSIS — Z1212 Encounter for screening for malignant neoplasm of rectum: Secondary | ICD-10-CM | POA: Diagnosis not present

## 2015-08-15 DIAGNOSIS — M79645 Pain in left finger(s): Secondary | ICD-10-CM | POA: Diagnosis not present

## 2015-08-15 DIAGNOSIS — M1812 Unilateral primary osteoarthritis of first carpometacarpal joint, left hand: Secondary | ICD-10-CM | POA: Diagnosis not present

## 2015-08-20 DIAGNOSIS — H524 Presbyopia: Secondary | ICD-10-CM | POA: Diagnosis not present

## 2015-08-20 DIAGNOSIS — H52223 Regular astigmatism, bilateral: Secondary | ICD-10-CM | POA: Diagnosis not present

## 2015-08-20 DIAGNOSIS — H5203 Hypermetropia, bilateral: Secondary | ICD-10-CM | POA: Diagnosis not present

## 2015-08-20 DIAGNOSIS — H26491 Other secondary cataract, right eye: Secondary | ICD-10-CM | POA: Diagnosis not present

## 2015-08-20 DIAGNOSIS — H26492 Other secondary cataract, left eye: Secondary | ICD-10-CM | POA: Diagnosis not present

## 2015-08-23 DIAGNOSIS — H26491 Other secondary cataract, right eye: Secondary | ICD-10-CM | POA: Diagnosis not present

## 2015-08-28 ENCOUNTER — Telehealth: Payer: Self-pay | Admitting: Obstetrics and Gynecology

## 2015-08-28 DIAGNOSIS — N907 Vulvar cyst: Secondary | ICD-10-CM

## 2015-08-28 NOTE — Telephone Encounter (Signed)
Spoke with patient. Patient states that she has been seen with Dr.Silva regarding a vulvar cyst and was offered to have it removed, but did not want to at that time. Reports the cyst has not gone away which she was advised by Dr.Silva may occur and she would like to have it removed at this time. Patient was last seen in the office for right vulvar cyst on 07/08/2015 with Dr.Silva. Reports cyst is in the same area and around the same size. Denies any changes to the area such as redness, oozing, or warmth. Denies any fevers or chills. Appointment for removal scheduled for 09/02/2015 at 10 am with Dr.Silva. Advised I will speak with Dr.Silva and return call with any further recommendations prior to her appointment. She is agreeable and verbalizes understanding.  Routing to [place] for review and advise. Order will need to be placed for cyst excision for precert.

## 2015-08-28 NOTE — Telephone Encounter (Signed)
[place] for vulvar cyst removal.  Size of the cyst is 5 mm.  I dot not have the code for precert for removal.  This is more than a vulvar biopsy.  Can you help with the appropriate order for this precert?

## 2015-08-28 NOTE — Telephone Encounter (Signed)
Patient has a cyst on her vagina and wants to have this removed.

## 2015-08-29 DIAGNOSIS — H26492 Other secondary cataract, left eye: Secondary | ICD-10-CM | POA: Diagnosis not present

## 2015-08-29 DIAGNOSIS — H52223 Regular astigmatism, bilateral: Secondary | ICD-10-CM | POA: Diagnosis not present

## 2015-08-29 DIAGNOSIS — Z961 Presence of intraocular lens: Secondary | ICD-10-CM | POA: Diagnosis not present

## 2015-08-29 DIAGNOSIS — H5203 Hypermetropia, bilateral: Secondary | ICD-10-CM | POA: Diagnosis not present

## 2015-08-29 DIAGNOSIS — Z9841 Cataract extraction status, right eye: Secondary | ICD-10-CM | POA: Diagnosis not present

## 2015-08-29 DIAGNOSIS — H524 Presbyopia: Secondary | ICD-10-CM | POA: Diagnosis not present

## 2015-08-29 NOTE — Telephone Encounter (Signed)
Reviewed with Deloris Ping. Order for [phone] which is the vulvar biopsy code placed with note that this will be a vulvar cyst removal. Deloris Ping will review the order and make changes as needed for precertification of vulvar cyst removal.  Cc: Lerry Liner  Routing to provider for final review. Patient agreeable to disposition. Will close encounter.

## 2015-08-30 DIAGNOSIS — M542 Cervicalgia: Secondary | ICD-10-CM | POA: Diagnosis not present

## 2015-09-02 ENCOUNTER — Encounter: Payer: Self-pay | Admitting: Obstetrics and Gynecology

## 2015-09-02 ENCOUNTER — Ambulatory Visit (INDEPENDENT_AMBULATORY_CARE_PROVIDER_SITE_OTHER): Payer: Medicare Other | Admitting: Obstetrics and Gynecology

## 2015-09-02 VITALS — BP 110/70 | HR 70 | Ht 65.0 in | Wt 140.2 lb

## 2015-09-02 DIAGNOSIS — N907 Vulvar cyst: Secondary | ICD-10-CM | POA: Diagnosis not present

## 2015-09-02 MED ORDER — DOXYCYCLINE HYCLATE 100 MG PO CAPS: 100.0000 mg | ORAL_CAPSULE | Freq: Two times a day (BID) | ORAL | Status: DC

## 2015-09-02 NOTE — Progress Notes (Signed)
Subjective:     Patient ID: [name], female   DOB: [birth year], 65 y.o.   MRN: [id number]  HPI  65 y.o. G29P0010 Married Caucasian female here today for excision of right vulvar sebaceous cyst.  Has flared back up again.  Feels painful. It was red but now not really so. No drainage.   Notes a cystic are on the left vulva as well.   GYNECOLOGY  VISIT   HPI: 65 y.o.   Married  Caucasian  female   G1P0010 with Patient's last menstrual period was 03/02/1988 (approximate).   here for   exicison of right vulvar cyst.   States it is painful.  Has increased in size.   Also has left vulvar cyst.   GYNECOLOGIC HISTORY: Patient's last menstrual period was 03/02/1988 (approximate). Contraception:  hyst Menopausal hormone therapy:  Estrace 0.5 mg, Vagifem 10 mcg. Last mammogram:  06/11/15 - Bi-RADS1, [facility]. Last pap smear:   2010 - neg.       OB History    Gravida Para Term Preterm AB TAB SAB Ectopic Multiple Living   1 0   1  1   0         Patient Active Problem List   Diagnosis Date Noted  . PVC (premature ventricular contraction) 11/08/2014  . OA (osteoarthritis) of knee 12/11/2013  . Vaginal atrophy 08/05/2011  . Arthritis 10/08/2010  . GERD (gastroesophageal reflux disease) 10/08/2010  . IBS (irritable bowel syndrome) 10/08/2010  . Depression 10/08/2010  . Fibromyalgia 10/08/2010  . Migraines 10/08/2010    Past Medical History  Diagnosis Date  . GERD (gastroesophageal reflux disease)   . IBS (irritable bowel syndrome)   . Mental disorder     depression  . Migraines   . Abnormal Pap smear     hx of colpo and cryo  . Thyroid disease   . Herpes   . Insomnia   . Depression   . Ulcer   . Arthritis     osteoarthritis  . Endometriosis   . Fibromyalgia     muscle spasms, joint pain triggered by stress  . History of blood transfusion 75    [place]  . Arthritis   . Osteopenia   . Meniscus tear     Right knee  . Heart murmur   . Hypothyroidism   .  Anxiety   . Chest pain   . SOB (shortness of breath)   . Palpitations   . Diaphoresis   . Osteoporosis 2016    began Prolia injections with Dr. Dagmar Hait 05/2014?    Past Surgical History  Procedure Laterality Date  . Cervical fusion  2002    x2  . Elbow surgery    . Tonsilectomy, adenoidectomy, bilateral myringotomy and tubes    . Appendectomy      age 41  . Laparoscopy      age 77  . Cataract extraction Bilateral 2012  . Cholecystectomy  2003  . Abdominal hysterectomy  1990  . Bartholin cyst marsupialization Right 07/05/2012    Procedure: BARTHOLIN CYST MARSUPIALIZATION;  Surgeon: Arloa Koh, MD;  Location: [facility];  Service: Gynecology;  Laterality: Right;  Excision of right Bartholin Gland  . Knee surgery Right 07/2012    menicus tear repair  . Gynecologic cryosurgery    . Colposcopy w/ biopsy / curettage      30 years ago  . Bunionectomy      left foot   . Total knee arthroplasty Right 12/11/2013  Procedure: RIGHT TOTAL KNEE ARTHROPLASTY;  Surgeon: Gearlean Alf, MD;  Location: [facility];  Service: Orthopedics;  Laterality: Right;    Current Outpatient Prescriptions  Medication Sig Dispense Refill  . acetaminophen (TYLENOL) 500 MG tablet Take 1,000 mg by mouth once as needed for mild pain or headache.    . ARIPiprazole (ABILIFY) 5 MG tablet Take 2.5 mg by mouth at bedtime.    . cyclobenzaprine (FLEXERIL) 10 MG tablet Take 10 mg by mouth at bedtime.     Marland Kitchen denosumab (PROLIA) 60 MG/ML SOLN injection Inject 60 mg into the skin every 6 (six) months. Administer in upper arm, thigh, or abdomen    . Docusate Calcium (STOOL SOFTENER PO) Take 3 tablets by mouth 2 (two) times daily.    . DULoxetine (CYMBALTA) 60 MG capsule Take 60 mg by mouth every morning.     Marland Kitchen estradiol (ESTRACE) 0.5 MG tablet Take 1 tablet (0.5 mg total) by mouth daily. 90 tablet 3  . Estradiol 10 MCG TABS vaginal tablet Place 1 tablet (10 mcg total) vaginally 2 (two) times a week. 8 tablet 11  . ipratropium  (ATROVENT) 0.03 % nasal spray Place 2 sprays into both nostrils daily.    Marland Kitchen levothyroxine (SYNTHROID, LEVOTHROID) 125 MCG tablet Take 125 mcg by mouth daily before breakfast. One hour before meal.    . Linaclotide (LINZESS) 290 MCG CAPS Take 1 capsule by mouth every morning. Takes with a glass of water.    Marland Kitchen LORazepam (ATIVAN) 1 MG tablet Take 2 mg by mouth at bedtime.    Marland Kitchen MAGNESIUM PO Take 3-4 tablets by mouth 2 (two) times daily. 3 tablets in the am and 4 tablets at night    . methocarbamol (ROBAXIN) 500 MG tablet Take 1 tablet (500 mg total) by mouth every 8 (eight) hours as needed for muscle spasms. 80 tablet 0  . metoprolol succinate (TOPROL XL) 25 MG 24 hr tablet Take 1 tablet (25 mg total) by mouth daily. 90 tablet 3  . NONFORMULARY OR COMPOUNDED ITEM Testosterone propionate 2% in white petrolatum, apply bid for 6 weeks and then daily as directed.  60 grams. 60 each 0  . OVER THE COUNTER MEDICATION Take 1 capsule by mouth daily. Eye health capsule daily- l    . Polyethyl Glycol-Propyl Glycol (SYSTANE OP) Place 1 drop into both eyes 2 (two) times daily.    . propranolol (INDERAL) 10 MG tablet Take 1 tablet (10 mg total) by mouth 4 (four) times daily as needed (as needed for fast heart rate and/or palpitations). 60 tablet 6  . SUMAtriptan (IMITREX) 6 MG/0.5ML SOLN injection Inject 6 mg into the skin every 2 (two) hours as needed for migraine or headache. F    . valACYclovir (VALTREX) 500 MG tablet TAKE 1 TABLET (500 MG TOTAL) BY MOUTH DAILY. 90 tablet 3  . DEXILANT 60 MG capsule Take 1 capsule by mouth daily.    Marland Kitchen doxycycline (VIBRAMYCIN) 100 MG capsule Take 1 capsule (100 mg total) by mouth 2 (two) times daily. Take BID for 14 days.  Take with food as can cause GI distress. 28 capsule 0  . SUMAtriptan 6 MG/0.5ML SOAJ Injection prn Migraine     No current facility-administered medications for this visit.     ALLERGIES: Codeine; Nsaids; Sulfa antibiotics; and Sulfamethoxazole  Family  History  Problem Relation Age of Onset  . Colon cancer Father   . Heart disease Father   . Kidney failure Father   . Hypertension  Father   . Alcohol abuse Father   . Stroke Mother   . Osteoporosis Mother   . Rheum arthritis Mother   . Dementia Mother   . Hypertension Mother   . Anxiety disorder Brother   . Insomnia Brother   . Depression Brother   . Alcohol abuse Brother   . Cancer Father     Social History   Social History  . Marital Status: Married    Spouse Name: N/A  . Number of Children: N/A  . Years of Education: N/A   Occupational History  . Not on file.   Social History Main Topics  . Smoking status: Never Smoker   . Smokeless tobacco: Never Used  . Alcohol Use: 7.2 - 8.4 oz/week    12-14 Glasses of wine per week     Comment: 2 glasses of wine at night  . Drug Use: No  . Sexual Activity:    Partners: Male    Birth Control/ Protection: Surgical     Comment: TAH   Other Topics Concern  . Not on file   Social History Narrative    Review of Systems  Pertinent items are in the HPI.       Objective:   Physical Exam BP 110/70 mmHg  Pulse 70  Ht 5\' 5"  (1.651 m)  Wt 140 lb 3.2 oz (63.594 kg)  BMI 23.33 kg/m2  LMP 03/02/1988 (Approximate)    General appearance: alert, cooperative and appears stated age Head: Normocephalic, without obvious abnormality, atraumatic Neck: no adenopathy, supple, symmetrical, trachea midline and thyroid normal to inspection and palpation Lungs: clear to auscultation bilaterally Heart: regular rate and rhythm Abdomen:  Soft, nontender, no masses.  No HSM. Extremities: extremities normal, atraumatic, no cyanosis or edema Skin: Skin color, texture, turgor normal. No rashes or lesions Lymph nodes: Cervical, supraclavicular, and axillary nodes normal. No abnormal inguinal nodes palpated Neurologic: Grossly normal  Pelvic: External genitalia:  Right labia majora 1.5 cm deep subcutaneous cyst.  Left labia majora 3 - 4 mm  sebaceous cyst.               Urethra:  normal appearing urethra with no masses, tenderness or lesions              Bartholins and Skenes: normal               Chaperone was present for exam   Assessment:     Bilateral vulvar cysts.   Both appear to be sebaceous cysts.  Left cyst increasing in size.  Potential for infection, so will plan for course of abx.    Plan:     Plan for excision of bilateral vulvar cysts in outpatient setting.  Risks, benefits, and alternatives reviewed with patient who wishes to proceed.  Course of Doxycycline 100 mg po bid for one week.  Precautions given.       __15_____ minutes face to face time of which over 50% was spent in counseling.   After visit summary to patient.

## 2015-09-09 ENCOUNTER — Telehealth: Payer: Self-pay | Admitting: Obstetrics and Gynecology

## 2015-09-09 NOTE — Telephone Encounter (Signed)
Patient was seen in the office on 09/02/2015 with Dr.Silva for excision of a right vulvar cyst. At her appointment left vulvar cyst was also noted. Patient was placed on Doxycycline 100 mg po bid for 1 week. She is to have bilateral cyst removal in an outpatient setting. Patient has completed her Doxycycline and is ready to schedule her cyst excision.  Assessment:     Bilateral vulvar cysts.  Both appear to be sebaceous cysts. Left cyst increasing in size. Potential for infection, so will plan for course of abx.    Plan:     Plan for excision of bilateral vulvar cysts in outpatient setting.  Risks, benefits, and alternatives reviewed with patient who wishes to proceed.  Course of Doxycycline 100 mg po bid for one week. Precautions given.         Routing to Lamont Snowball, RN for review and scheduling.

## 2015-09-09 NOTE — Telephone Encounter (Signed)
Patient is asking to talk with Dr.Silva's nurse regarding having a cyst removed. Patient also wanted to let Dr.Silva know that she stopped taking the doxycycline (VIBRAMYCIN) 100 MG capsule.

## 2015-09-09 NOTE — Telephone Encounter (Signed)
Spoke with patient regarding benefit for surgery. Patient understood and agreeable. Patient aware this is professional benefit only. Patient aware will be contacted by hospital for separate benefits. Patient had no questions and is ready to proceed with scheduling  Routing to Lamont Snowball for scheduling

## 2015-09-10 NOTE — Telephone Encounter (Signed)
Call to patient, left message to call back. 

## 2015-09-10 NOTE — Telephone Encounter (Signed)
Patient returns call. Surgery date options reviewed and patient agreeable to proceed with first available date. Surgery scheduled for 09-24-15 at 1015 at [place] to arrive at [place] unless hospital instructs differently. Surgery instruction sheet reviewed and printed copy will be provided at surgery consult on Friday 09-13-15. Patient states she had to stop taking the doxycycline.   Routing to provider for final review. Patient agreeable to disposition. Will close encounter.

## 2015-09-13 ENCOUNTER — Ambulatory Visit (INDEPENDENT_AMBULATORY_CARE_PROVIDER_SITE_OTHER): Payer: Medicare Other | Admitting: Obstetrics and Gynecology

## 2015-09-13 ENCOUNTER — Encounter: Payer: Self-pay | Admitting: Obstetrics and Gynecology

## 2015-09-13 VITALS — BP 110/70 | HR 72 | Resp 18 | Ht 65.0 in | Wt 140.0 lb

## 2015-09-13 DIAGNOSIS — N907 Vulvar cyst: Secondary | ICD-10-CM | POA: Diagnosis not present

## 2015-09-13 DIAGNOSIS — M542 Cervicalgia: Secondary | ICD-10-CM | POA: Diagnosis not present

## 2015-09-13 NOTE — Progress Notes (Signed)
GYNECOLOGY  VISIT   HPI: 65 y.o.   Married  Caucasian  female   [id number] with Patient's last menstrual period was 03/02/1988 (approximate).   here for Pre Op - 09/24/15 EXCISION VAGINAL CYST, bilateral vulvar cysts.  Cyst shrunk after taking the doxycycline but she stopped due to upset stomach, LE swelling, and aching joints.  The cyst on the right labia comes and goes and becomes painful.   Had a tick bite on her lower right abdomen this am.  No rash around the area.  Husband is struggling with anxiety issues.   GYNECOLOGIC HISTORY: Patient's last menstrual period was 03/02/1988 (approximate). Contraception:  Hysterectomy  Menopausal hormone therapy:  Estrace 0.5mg , Vagifem 39mcg. Last mammogram:  06/11/15 BIRADS1:Neg Last pap smear:   2010 Neg        OB History    Gravida Para Term Preterm AB TAB SAB Ectopic Multiple Living   1 0   1  1   0         Patient Active Problem List   Diagnosis Date Noted  . PVC (premature ventricular contraction) 11/08/2014  . OA (osteoarthritis) of knee 12/11/2013  . Vaginal atrophy 08/05/2011  . Arthritis 10/08/2010  . GERD (gastroesophageal reflux disease) 10/08/2010  . IBS (irritable bowel syndrome) 10/08/2010  . Depression 10/08/2010  . Fibromyalgia 10/08/2010  . Migraines 10/08/2010    Past Medical History  Diagnosis Date  . GERD (gastroesophageal reflux disease)   . IBS (irritable bowel syndrome)   . Mental disorder     depression  . Migraines   . Abnormal Pap smear     hx of colpo and cryo  . Thyroid disease   . Herpes   . Insomnia   . Depression   . Ulcer   . Arthritis     osteoarthritis  . Endometriosis   . Fibromyalgia     muscle spasms, joint pain triggered by stress  . History of blood transfusion 75     [place]  . Arthritis   . Osteopenia   . Meniscus tear     Right knee  . Heart murmur   . Hypothyroidism   . Anxiety   . Chest pain   . SOB (shortness of breath)   . Palpitations   . Diaphoresis   .  Osteoporosis 2016    began Prolia injections with Dr. Dagmar Hait 05/2014?    Past Surgical History  Procedure Laterality Date  . Cervical fusion  2002    x2  . Elbow surgery    . Tonsilectomy, adenoidectomy, bilateral myringotomy and tubes    . Appendectomy      age 45  . Laparoscopy      age 33  . Cataract extraction Bilateral 2012  . Cholecystectomy  2003  . Abdominal hysterectomy  1990  . Bartholin cyst marsupialization Right 07/05/2012    Procedure: BARTHOLIN CYST MARSUPIALIZATION;  Surgeon: Arloa Koh, MD;  Location: [facility];  Service: Gynecology;  Laterality: Right;  Excision of right Bartholin Gland  . Knee surgery Right 07/2012    menicus tear repair  . Gynecologic cryosurgery    . Colposcopy w/ biopsy / curettage      30 years ago  . Bunionectomy      left foot   . Total knee arthroplasty Right 12/11/2013    Procedure: RIGHT TOTAL KNEE ARTHROPLASTY;  Surgeon: Gearlean Alf, MD;  Location: [facility];  Service: Orthopedics;  Laterality: Right;    Current Outpatient Prescriptions  Medication  Sig Dispense Refill  . acetaminophen (TYLENOL) 500 MG tablet Take 1,000 mg by mouth once as needed for mild pain or headache.    . ARIPiprazole (ABILIFY) 5 MG tablet Take 2.5 mg by mouth at bedtime.    . cyclobenzaprine (FLEXERIL) 10 MG tablet Take 10 mg by mouth at bedtime.     Marland Kitchen denosumab (PROLIA) 60 MG/ML SOLN injection Inject 60 mg into the skin every 6 (six) months. Administer in upper arm, thigh, or abdomen    . DEXILANT 60 MG capsule Take 1 capsule by mouth daily.    [name] Calcium (STOOL SOFTENER PO) Take 3 tablets by mouth 2 (two) times daily.    . DULoxetine (CYMBALTA) 60 MG capsule Take 60 mg by mouth every morning.     Marland Kitchen estradiol (ESTRACE) 0.5 MG tablet Take 1 tablet (0.5 mg total) by mouth daily. 90 tablet 3  . Estradiol 10 MCG TABS vaginal tablet Place 1 tablet (10 mcg total) vaginally 2 (two) times a week. 8 tablet 11  . ipratropium (ATROVENT) 0.03 % nasal spray Place 2  sprays into both nostrils daily.    Marland Kitchen levothyroxine (SYNTHROID, LEVOTHROID) 125 MCG tablet Take 125 mcg by mouth daily before breakfast. One hour before meal.    . Linaclotide (LINZESS) 290 MCG CAPS Take 1 capsule by mouth every morning. Takes with a glass of water.    Marland Kitchen LORazepam (ATIVAN) 1 MG tablet Take 2 mg by mouth at bedtime.    Marland Kitchen MAGNESIUM PO Take 3-4 tablets by mouth 2 (two) times daily. 3 tablets in the am and 4 tablets at night    . methocarbamol (ROBAXIN) 500 MG tablet Take 1 tablet (500 mg total) by mouth every 8 (eight) hours as needed for muscle spasms. 80 tablet 0  . metoprolol succinate (TOPROL XL) 25 MG 24 hr tablet Take 1 tablet (25 mg total) by mouth daily. 90 tablet 3  . NONFORMULARY OR COMPOUNDED ITEM Testosterone propionate 2% in white petrolatum, apply bid for 6 weeks and then daily as directed.  60 grams. (Patient taking differently: Apply 1 application topically daily. Testosterone propionate 2% in white petrolatum, apply bid for 6 weeks and then daily as directed.  60 grams.) 60 each 0  . OVER THE COUNTER MEDICATION Take 1 capsule by mouth daily. Eye health capsule daily- l    . Polyethyl Glycol-Propyl Glycol (SYSTANE OP) Place 1 drop into both eyes 2 (two) times daily.    . propranolol (INDERAL) 10 MG tablet Take 1 tablet (10 mg total) by mouth 4 (four) times daily as needed (as needed for fast heart rate and/or palpitations). 60 tablet 6  . SUMAtriptan (IMITREX) 6 MG/0.5ML SOLN injection Inject 6 mg into the skin every 2 (two) hours as needed for migraine or headache. F    . valACYclovir (VALTREX) 500 MG tablet TAKE 1 TABLET (500 MG TOTAL) BY MOUTH DAILY. 90 tablet 3   No current facility-administered medications for this visit.     ALLERGIES: Codeine; Doxycycline; Nsaids; Sulfa antibiotics; and Sulfamethoxazole  Family History  Problem Relation Age of Onset  . Colon cancer Father   . Heart disease Father   . Kidney failure Father   . Hypertension Father   .  Alcohol abuse Father   . Stroke Mother   . Osteoporosis Mother   . Rheum arthritis Mother   . Dementia Mother   . Hypertension Mother   . Anxiety disorder Brother   . Insomnia Brother   . Depression  Brother   . Alcohol abuse Brother   . Cancer Father     Social History   Social History  . Marital Status: Married    Spouse Name: N/A  . Number of Children: N/A  . Years of Education: N/A   Occupational History  . Not on file.   Social History Main Topics  . Smoking status: Never Smoker   . Smokeless tobacco: Never Used  . Alcohol Use: 7.2 - 8.4 oz/week    12-14 Glasses of wine per week     Comment: 2 glasses of wine at night  . Drug Use: No  . Sexual Activity:    Partners: Male    Birth Control/ Protection: Surgical     Comment: TAH   Other Topics Concern  . Not on file   Social History Narrative    ROS:  Pertinent items are noted in HPI.  PHYSICAL EXAMINATION:    BP 110/70 mmHg  Pulse 72  Resp 18  Ht 5\' 5"  (1.651 m)  Wt 140 lb (63.504 kg)  BMI 23.30 kg/m2  LMP 03/02/1988 (Approximate)    General appearance: alert, cooperative and appears stated age Head: Normocephalic, without obvious abnormality, atraumatic Neck: no adenopathy, supple, symmetrical, trachea midline and thyroid normal to inspection and palpation Lungs: clear to auscultation bilaterally Heart: regular rate and rhythm Abdomen:  soft, non-tender, no masses,  no organomegaly Extremities: extremities normal, atraumatic, no cyanosis or edema Skin: Skin color, texture, turgor normal. No rashes or lesions Lymph nodes: Cervical, supraclavicular, and axillary nodes normal. No abnormal inguinal nodes palpated Neurologic: Grossly normal  Pelvic: External genitalia:  Right labia majora 0.75 cm subcutaneous cyst, nontender.  Right lateral labia majora with 3 mm sebaceous cyst.              Urethra:  normal appearing urethra with no masses, tenderness or lesions              Bartholins and Skenes:  normal                 Vagina: normal appearing vagina with normal color and discharge, no lesions              Cervix: absent          Bimanual Exam:  Uterus:  uterus absent              Adnexa: no mass, fullness, tenderness            Chaperone was present for exam.  ASSESSMENT  Bilateral vulvar cysts.    PLAN  Excision of vulvar cysts in OR.  Risks and benefits reviewed.  Risks include bleeding, infection, and risk of anesthesia.  Low risk of DVT/PE. Procedure expectations and recovery explained.   She wishes to proceed.    An After Visit Summary was printed and given to the patient.  _15_____ minutes face to face time of which over 50% was spent in counseling.

## 2015-09-13 NOTE — Patient Instructions (Addendum)
Your procedure is scheduled on:  Tuesday, September 24, 2015  Enter through the Main Entrance of [facility] at:  8:30 AM  Pick up the phone at the desk and dial [phone].  Call this number if you have problems the morning of surgery: [phone].  Remember: Do NOT eat food or drink after:  Midnight Monday  Take these medicines the morning of surgery with a SIP OF WATER:  Metoprolol, Levothyroxine, Dexilant, Cymbalta, Valtrex, Linzess  Do NOT wear jewelry (body piercing), metal hair clips/bobby pins, make-up, or nail polish. Do NOT wear lotions, powders, or perfumes.  You may wear deodorant. Do NOT shave for 48 hours prior to surgery. Do NOT bring valuables to the hospital. Contacts, dentures, or bridgework may not be worn into surgery.  Have a responsible adult drive you home and stay with you for 24 hours after your procedure

## 2015-09-16 ENCOUNTER — Encounter (HOSPITAL_COMMUNITY): Payer: Self-pay

## 2015-09-16 ENCOUNTER — Encounter (HOSPITAL_COMMUNITY)
Admission: RE | Admit: 2015-09-16 | Discharge: 2015-09-16 | Disposition: A | Discharge status: Home / Self Care | Payer: Medicare Other | Source: Ambulatory Visit | Attending: Obstetrics and Gynecology | Admitting: Obstetrics and Gynecology

## 2015-09-16 DIAGNOSIS — Z01812 Encounter for preprocedural laboratory examination: Secondary | ICD-10-CM | POA: Diagnosis not present

## 2015-09-16 HISTORY — DX: Spontaneous ecchymoses: R23.3

## 2015-09-16 HISTORY — DX: Hypotension, unspecified: I95.9

## 2015-09-16 HISTORY — DX: Palpitations: R00.2

## 2015-09-16 HISTORY — DX: Other skin changes: R23.8

## 2015-09-16 LAB — CBC
HCT: 40.8 % (ref 36.0–46.0)
Hemoglobin: 13.9 g/dL (ref 12.0–15.0)
MCH: 34.6 pg — ABNORMAL HIGH (ref 26.0–34.0)
MCHC: 34.1 g/dL (ref 30.0–36.0)
MCV: 101.5 fL — ABNORMAL HIGH (ref 78.0–100.0)
Platelets: 204 10*3/uL (ref 150–400)
RBC: 4.02 MIL/uL (ref 3.87–5.11)
RDW: 13.4 % (ref 11.5–15.5)
WBC: 6.2 10*3/uL (ref 4.0–10.5)

## 2015-09-16 LAB — BASIC METABOLIC PANEL
Anion gap: 6 (ref 5–15)
BUN: 17 mg/dL (ref 6–20)
CO2: 29 mmol/L (ref 22–32)
Calcium: 9.1 mg/dL (ref 8.9–10.3)
Chloride: 101 mmol/L (ref 101–111)
Creatinine, Ser: 0.91 mg/dL (ref 0.44–1.00)
GFR calc Af Amer: >60 mL/min (ref >60)
GFR calc non Af Amer: >60 mL/min (ref >60)
Glucose, Bld: 90 mg/dL (ref 65–99)
Potassium: 5 mmol/L (ref 3.5–5.1)
Sodium: 136 mmol/L (ref 135–145)

## 2015-09-17 DIAGNOSIS — M542 Cervicalgia: Secondary | ICD-10-CM | POA: Diagnosis not present

## 2015-09-23 ENCOUNTER — Encounter (HOSPITAL_COMMUNITY): Payer: Self-pay

## 2015-09-24 ENCOUNTER — Ambulatory Visit (HOSPITAL_COMMUNITY): Payer: Medicare Other | Admitting: Anesthesiology

## 2015-09-24 ENCOUNTER — Encounter (HOSPITAL_COMMUNITY)
Admission: RE | Disposition: A | Discharge status: Home / Self Care | Payer: Self-pay | Source: Ambulatory Visit | Attending: Obstetrics and Gynecology

## 2015-09-24 ENCOUNTER — Encounter (HOSPITAL_COMMUNITY): Payer: Self-pay

## 2015-09-24 ENCOUNTER — Ambulatory Visit (HOSPITAL_COMMUNITY)
Admission: RE | Admit: 2015-09-24 | Discharge: 2015-09-24 | Disposition: A | Discharge status: Home / Self Care | Payer: Medicare Other | Source: Ambulatory Visit | Attending: Obstetrics and Gynecology | Admitting: Obstetrics and Gynecology

## 2015-09-24 DIAGNOSIS — M1990 Unspecified osteoarthritis, unspecified site: Secondary | ICD-10-CM | POA: Diagnosis not present

## 2015-09-24 DIAGNOSIS — N8302 Follicular cyst of left ovary: Secondary | ICD-10-CM | POA: Insufficient documentation

## 2015-09-24 DIAGNOSIS — Z881 Allergy status to other antibiotic agents status: Secondary | ICD-10-CM | POA: Diagnosis not present

## 2015-09-24 DIAGNOSIS — M797 Fibromyalgia: Secondary | ICD-10-CM | POA: Diagnosis not present

## 2015-09-24 DIAGNOSIS — I499 Cardiac arrhythmia, unspecified: Secondary | ICD-10-CM | POA: Diagnosis not present

## 2015-09-24 DIAGNOSIS — Z90710 Acquired absence of both cervix and uterus: Secondary | ICD-10-CM | POA: Insufficient documentation

## 2015-09-24 DIAGNOSIS — N907 Vulvar cyst: Secondary | ICD-10-CM | POA: Diagnosis not present

## 2015-09-24 DIAGNOSIS — G4700 Insomnia, unspecified: Secondary | ICD-10-CM | POA: Insufficient documentation

## 2015-09-24 DIAGNOSIS — F419 Anxiety disorder, unspecified: Secondary | ICD-10-CM | POA: Diagnosis not present

## 2015-09-24 DIAGNOSIS — Z981 Arthrodesis status: Secondary | ICD-10-CM | POA: Insufficient documentation

## 2015-09-24 DIAGNOSIS — M810 Age-related osteoporosis without current pathological fracture: Secondary | ICD-10-CM | POA: Diagnosis not present

## 2015-09-24 DIAGNOSIS — N8301 Follicular cyst of right ovary: Secondary | ICD-10-CM | POA: Insufficient documentation

## 2015-09-24 DIAGNOSIS — E039 Hypothyroidism, unspecified: Secondary | ICD-10-CM | POA: Insufficient documentation

## 2015-09-24 DIAGNOSIS — Z885 Allergy status to narcotic agent status: Secondary | ICD-10-CM | POA: Diagnosis not present

## 2015-09-24 DIAGNOSIS — Z79899 Other long term (current) drug therapy: Secondary | ICD-10-CM | POA: Insufficient documentation

## 2015-09-24 DIAGNOSIS — Z882 Allergy status to sulfonamides status: Secondary | ICD-10-CM | POA: Diagnosis not present

## 2015-09-24 DIAGNOSIS — G43909 Migraine, unspecified, not intractable, without status migrainosus: Secondary | ICD-10-CM | POA: Insufficient documentation

## 2015-09-24 DIAGNOSIS — R011 Cardiac murmur, unspecified: Secondary | ICD-10-CM | POA: Insufficient documentation

## 2015-09-24 DIAGNOSIS — Z9049 Acquired absence of other specified parts of digestive tract: Secondary | ICD-10-CM | POA: Insufficient documentation

## 2015-09-24 DIAGNOSIS — Z96651 Presence of right artificial knee joint: Secondary | ICD-10-CM | POA: Insufficient documentation

## 2015-09-24 DIAGNOSIS — Z886 Allergy status to analgesic agent status: Secondary | ICD-10-CM | POA: Insufficient documentation

## 2015-09-24 DIAGNOSIS — K589 Irritable bowel syndrome without diarrhea: Secondary | ICD-10-CM | POA: Insufficient documentation

## 2015-09-24 DIAGNOSIS — N898 Other specified noninflammatory disorders of vagina: Secondary | ICD-10-CM | POA: Diagnosis not present

## 2015-09-24 DIAGNOSIS — F329 Major depressive disorder, single episode, unspecified: Secondary | ICD-10-CM | POA: Insufficient documentation

## 2015-09-24 DIAGNOSIS — N9489 Other specified conditions associated with female genital organs and menstrual cycle: Secondary | ICD-10-CM | POA: Insufficient documentation

## 2015-09-24 DIAGNOSIS — D280 Benign neoplasm of vulva: Secondary | ICD-10-CM | POA: Insufficient documentation

## 2015-09-24 DIAGNOSIS — M8580 Other specified disorders of bone density and structure, unspecified site: Secondary | ICD-10-CM | POA: Insufficient documentation

## 2015-09-24 DIAGNOSIS — K219 Gastro-esophageal reflux disease without esophagitis: Secondary | ICD-10-CM | POA: Diagnosis not present

## 2015-09-24 HISTORY — PX: EXCISION VAGINAL CYST: SHX5825

## 2015-09-24 SURGERY — EXCISION, CYST, VAGINA
Anesthesia: Monitor Anesthesia Care | Site: Vagina | Laterality: Bilateral

## 2015-09-24 MED ORDER — SCOPOLAMINE 1 MG/3DAYS TD PT72: 1.0000 | MEDICATED_PATCH | Freq: Once | TRANSDERMAL | Status: DC

## 2015-09-24 MED ORDER — DEXAMETHASONE SODIUM PHOSPHATE 10 MG/ML IJ SOLN
INTRAMUSCULAR | Status: DC | PRN
  Administered 2015-09-24: 4 mg via INTRAVENOUS

## 2015-09-24 MED ORDER — MIDAZOLAM HCL 2 MG/2ML IJ SOLN
INTRAMUSCULAR | Status: AC
  Filled 2015-09-24: qty 2

## 2015-09-24 MED ORDER — ONDANSETRON HCL 4 MG/2ML IJ SOLN: 4.0000 mg | Freq: Once | INTRAMUSCULAR | Status: DC | PRN

## 2015-09-24 MED ORDER — PROPOFOL 500 MG/50ML IV EMUL
INTRAVENOUS | Status: DC | PRN
  Administered 2015-09-24: 100 ug/kg/min via INTRAVENOUS

## 2015-09-24 MED ORDER — TRAMADOL HCL 50 MG PO TABS: 50.0000 mg | ORAL_TABLET | Freq: Four times a day (QID) | ORAL | 0 refills | Status: DC | PRN

## 2015-09-24 MED ORDER — LIDOCAINE HCL 1 % IJ SOLN
INTRAMUSCULAR | Status: AC
  Filled 2015-09-24: qty 20

## 2015-09-24 MED ORDER — LIDOCAINE HCL 1 % IJ SOLN
INTRAMUSCULAR | Status: DC | PRN
  Administered 2015-09-24: 9 mL

## 2015-09-24 MED ORDER — FENTANYL CITRATE (PF) 100 MCG/2ML IJ SOLN
25.0000 ug | INTRAMUSCULAR | Status: DC | PRN
  Administered 2015-09-24: 25 ug via INTRAVENOUS

## 2015-09-24 MED ORDER — FENTANYL CITRATE (PF) 100 MCG/2ML IJ SOLN
INTRAMUSCULAR | Status: AC
  Administered 2015-09-24: 25 ug via INTRAVENOUS
  Filled 2015-09-24: qty 2

## 2015-09-24 MED ORDER — ONDANSETRON HCL 4 MG/2ML IJ SOLN
INTRAMUSCULAR | Status: DC | PRN
  Administered 2015-09-24: 4 mg via INTRAVENOUS

## 2015-09-24 MED ORDER — ONDANSETRON HCL 4 MG/2ML IJ SOLN
INTRAMUSCULAR | Status: AC
  Filled 2015-09-24: qty 2

## 2015-09-24 MED ORDER — LIDOCAINE HCL (CARDIAC) 20 MG/ML IV SOLN
INTRAVENOUS | Status: AC
  Filled 2015-09-24: qty 5

## 2015-09-24 MED ORDER — CEFAZOLIN SODIUM-DEXTROSE 2-4 GM/100ML-% IV SOLN
2.0000 g | INTRAVENOUS | Status: AC
  Administered 2015-09-24: 2 g via INTRAVENOUS

## 2015-09-24 MED ORDER — SUMATRIPTAN SUCCINATE 6 MG/0.5ML ~~LOC~~ SOLN
6.0000 mg | Freq: Once | SUBCUTANEOUS | Status: AC
  Administered 2015-09-24: 6 mg via SUBCUTANEOUS
  Filled 2015-09-24: qty 0.5

## 2015-09-24 MED ORDER — FENTANYL CITRATE (PF) 100 MCG/2ML IJ SOLN
INTRAMUSCULAR | Status: AC
  Filled 2015-09-24: qty 2

## 2015-09-24 MED ORDER — PROPOFOL 10 MG/ML IV BOLUS
INTRAVENOUS | Status: AC
  Filled 2015-09-24: qty 40

## 2015-09-24 MED ORDER — DEXAMETHASONE SODIUM PHOSPHATE 4 MG/ML IJ SOLN
INTRAMUSCULAR | Status: AC
  Filled 2015-09-24: qty 1

## 2015-09-24 MED ORDER — FENTANYL CITRATE (PF) 100 MCG/2ML IJ SOLN
INTRAMUSCULAR | Status: DC | PRN
  Administered 2015-09-24: 25 ug via INTRAVENOUS

## 2015-09-24 MED ORDER — MIDAZOLAM HCL 2 MG/2ML IJ SOLN
INTRAMUSCULAR | Status: DC | PRN
  Administered 2015-09-24: 2 mg via INTRAVENOUS

## 2015-09-24 MED ORDER — LACTATED RINGERS IV SOLN
INTRAVENOUS | Status: DC
  Administered 2015-09-24 (×2): via INTRAVENOUS

## 2015-09-24 SURGICAL SUPPLY — 25 items
BLADE SURG 15 STRL LF C SS BP (BLADE) ×1 IMPLANT
BLADE SURG 15 STRL SS (BLADE) ×2
CLOTH BEACON ORANGE TIMEOUT ST (SAFETY) ×3 IMPLANT
CONTAINER PREFILL 10% NBF 15ML (MISCELLANEOUS) ×3 IMPLANT
COUNTER NEEDLE 1200 MAGNETIC (NEEDLE) ×3 IMPLANT
ELECT REM PT RETURN 9FT ADLT (ELECTROSURGICAL) ×3
ELECTRODE REM PT RTRN 9FT ADLT (ELECTROSURGICAL) ×1 IMPLANT
GLOVE BIO SURGEON STRL SZ 6.5 (GLOVE) ×2 IMPLANT
GLOVE BIO SURGEONS STRL SZ 6.5 (GLOVE) ×1
GLOVE BIOGEL PI IND STRL 7.0 (GLOVE) ×1 IMPLANT
GLOVE BIOGEL PI INDICATOR 7.0 (GLOVE) ×2
GOWN STRL REUS W/TWL LRG LVL3 (GOWN DISPOSABLE) ×6 IMPLANT
NEEDLE HYPO 22GX1.5 SAFETY (NEEDLE) ×3 IMPLANT
NS IRRIG 1000ML POUR BTL (IV SOLUTION) ×3 IMPLANT
PACK VAGINAL MINOR WOMEN LF (CUSTOM PROCEDURE TRAY) ×3 IMPLANT
PAD OB MATERNITY 4.3X12.25 (PERSONAL CARE ITEMS) ×3 IMPLANT
PAD PREP 24X48 CUFFED NSTRL (MISCELLANEOUS) ×3 IMPLANT
PENCIL BUTTON HOLSTER BLD 10FT (ELECTRODE) ×3 IMPLANT
SUT VIC AB 0 CT1 27 (SUTURE)
SUT VIC AB 0 CT1 27XBRD ANBCTR (SUTURE) IMPLANT
SUT VIC AB 2-0 SH 27 (SUTURE) ×2
SUT VIC AB 2-0 SH 27XBRD (SUTURE) ×1 IMPLANT
SUT VICRYL RAPIDE 4/0 PS 2 (SUTURE) IMPLANT
TOWEL OR 17X24 6PK STRL BLUE (TOWEL DISPOSABLE) ×6 IMPLANT
WATER STERILE IRR 1000ML POUR (IV SOLUTION) ×3 IMPLANT

## 2015-09-24 NOTE — Anesthesia Postprocedure Evaluation (Signed)
Anesthesia Post Note  Patient: [name]  Procedure(s) Performed: Procedure(s) (LRB): EXCISION VAGINAL CYST, bilateral vulvar cysts (Bilateral)  Patient location during evaluation: PACU Anesthesia Type: MAC Level of consciousness: awake and alert Pain management: pain level controlled Vital Signs Assessment: post-procedure vital signs reviewed and stable Respiratory status: spontaneous breathing, nonlabored ventilation, respiratory function stable and patient connected to nasal cannula oxygen Cardiovascular status: stable and blood pressure returned to baseline Anesthetic complications: no     Last Vitals:  Vitals:   09/24/15 1038 09/24/15 1100  BP:  113/77  Pulse:    Resp: 16   Temp: 36.4 C     Last Pain:  Vitals:   09/24/15 1100  TempSrc:   PainSc: 0-No pain   Pain Goal:                 [name]

## 2015-09-24 NOTE — Brief Op Note (Signed)
09/24/2015  10:44 AM  PATIENT:  [name]  65 y.o. female  PRE-OPERATIVE DIAGNOSIS:  vulvar cysts, unresponsive to antibiotics  POST-OPERATIVE DIAGNOSIS:  vulvar cysts, unresponsive to antibiotics  PROCEDURE:  Procedure(s): EXCISION VAGINAL CYST, bilateral vulvar cysts (Bilateral)  SURGEON:  Surgeon(s) and Role:    * Brook E Yisroel Ramming, MD - Primary  PHYSICIAN ASSISTANT:  NA  ASSISTANTS: none   ANESTHESIA:   local and MAC  EBL:  Total I/O In: 1200 [I.V.:1200] Out: 155 [Urine:150; Blood:5]  BLOOD ADMINISTERED:none  DRAINS: none   LOCAL MEDICATIONS USED:  LIDOCAINE 1%  SPECIMEN:  Source of Specimen:  left and right vulvar cysts.  DISPOSITION OF SPECIMEN:  PATHOLOGY  COUNTS:  YES  TOURNIQUET:  * No tourniquets in log *  DICTATION: .Other Dictation: Dictation Number    PLAN OF CARE: Discharge to home after PACU  PATIENT DISPOSITION:  PACU - hemodynamically stable.   Delay start of Pharmacological VTE agent (>24hrs) due to surgical blood loss or risk of bleeding: not applicable

## 2015-09-24 NOTE — Anesthesia Preprocedure Evaluation (Signed)
Anesthesia Evaluation  Patient identified by MRN, date of birth, ID band Patient awake    Reviewed: Allergy & Precautions, NPO status , Patient's Chart, lab work & pertinent test results  History of Anesthesia Complications Negative for: history of anesthetic complications  Airway Mallampati: II  TM Distance: >3 FB Neck ROM: Full    Dental  (+) Teeth Intact, Dental Advisory Given, Caps,    Pulmonary neg pulmonary ROS,    Pulmonary exam normal breath sounds clear to auscultation       Cardiovascular Exercise Tolerance: Good (-) hypertension(-) CAD and (-) Past MI Normal cardiovascular exam+ dysrhythmias (Palpitations--on metroprolol)  Rhythm:Regular Rate:Normal     Neuro/Psych  Headaches, PSYCHIATRIC DISORDERS Anxiety Depression    GI/Hepatic Neg liver ROS, GERD  Medicated and Controlled,  Endo/Other  Hypothyroidism   Renal/GU negative Renal ROS     Musculoskeletal  (+) Arthritis , Osteoarthritis,  Fibromyalgia -, narcotic dependent  Abdominal   Peds  Hematology negative hematology ROS (+)   Anesthesia Other Findings Day of surgery medications reviewed with the patient.  Reproductive/Obstetrics                             Anesthesia Physical Anesthesia Plan  ASA: III  Anesthesia Plan: MAC   Post-op Pain Management:    Induction: Intravenous  Airway Management Planned: Nasal Cannula  Additional Equipment:   Intra-op Plan:   Post-operative Plan:   Informed Consent: I have reviewed the patients History and Physical, chart, labs and discussed the procedure including the risks, benefits and alternatives for the proposed anesthesia with the patient or authorized representative who has indicated his/her understanding and acceptance.   Dental advisory given  Plan Discussed with: CRNA and Anesthesiologist  Anesthesia Plan Comments: (Discussed risks/benefits/alternatives to MAC  sedation including need for ventilatory support, hypotension, need for conversion to general anesthesia.  All patient questions answered.  Patient/guardian wishes to proceed.)        Anesthesia Quick Evaluation

## 2015-09-24 NOTE — Progress Notes (Signed)
Update to History and Physical  Woke up with a migraine headache today.  Received Imitrex here and is feeling better now.  Ok to proceed with surgery.  Will treat post op pain with Tramadol.  Unable to take NSAIDS.

## 2015-09-24 NOTE — Transfer of Care (Signed)
Immediate Anesthesia Transfer of Care Note  Patient: [name]  Procedure(s) Performed: Procedure(s): EXCISION VAGINAL CYST, bilateral vulvar cysts (Bilateral)  Patient Location: PACU  Anesthesia Type:MAC  Level of Consciousness: awake, alert  and oriented  Airway & Oxygen Therapy: Patient Spontanous Breathing  Post-op Assessment: Report given to RN and Post -op Vital signs reviewed and stable  Post vital signs: Reviewed and stable  Last Vitals:  Vitals:   09/24/15 0836  BP: 122/87  Pulse: 71  Resp: 16  Temp: 36.4 C    Last Pain:  Vitals:   09/24/15 0840  TempSrc:   PainSc: 5          Complications: No apparent anesthesia complications

## 2015-09-24 NOTE — Discharge Instructions (Addendum)
°  Post Anesthesia Home Care Instructions  Activity: Get plenty of rest for the remainder of the day. A responsible adult should stay with you for 24 hours following the procedure.  For the next 24 hours, DO NOT: -Drive a car -Paediatric nurse -Drink alcoholic beverages -Take any medication unless instructed by your physician -Make any legal decisions or sign important papers.  Meals: Start with liquid foods such as gelatin or soup. Progress to regular foods as tolerated. Avoid greasy, spicy, heavy foods. If nausea and/or vomiting occur, drink only clear liquids until the nausea and/or vomiting subsides. Call your physician if vomiting continues.  Special Instructions/Symptoms: Your throat may feel dry or sore from the anesthesia or the breathing tube placed in your throat during surgery. If this causes discomfort, gargle with warm salt water. The discomfort should disappear within 24 hours.  [name],   You did well with the removal of the vulvar cysts today.  The one on the right was deep, just as we expected.  I am recommending the Tramadol and Tylenol for pain.  An ice pack on your vulva for the next 24 hours will also reduce pain and swelling.  Please keep the incisions clean and dry for at least 24 hours.  Call if you have any fever, heavy bleeding, or redness or drainage from the incisions.  We will assess the sutures at your two week post op visit and determine if they need to be removed or if we will let them fall out on their own.  They are dissolvable.  Thank you,   Josefa Half, MD

## 2015-09-24 NOTE — H&P (Signed)
Office Visit   09/13/2015 [facility]  [name] E Yisroel Ramming, MD  Obstetrics and Gynecology   Vulvar cysts  Dx   Pre-op Exam ; Referred by Prince Solian, MD  Reason for Visit   Progress Notes     GYNECOLOGY  VISIT   HPI: 65 y.o.   Married  Caucasian  female   G1P0010 with Patient's last menstrual period was 03/02/1988 (approximate).   here for Pre Op - 09/24/15 EXCISION VAGINAL CYST, bilateral vulvar cysts.  Cyst shrunk after taking the doxycycline but she stopped due to upset stomach, LE swelling, and aching joints.  The cyst on the right labia comes and goes and becomes painful.   Had a tick bite on her lower right abdomen this am.  No rash around the area.  Husband is struggling with anxiety issues.   GYNECOLOGIC HISTORY: Patient's last menstrual period was 03/02/1988 (approximate). Contraception:  Hysterectomy  Menopausal hormone therapy:  Estrace 0.5mg , Vagifem 19mcg. Last mammogram:  06/11/15 BIRADS1:Neg Last pap smear:   2010 Neg        OB History    Gravida Para Term Preterm AB TAB SAB Ectopic Multiple Living   1 0   1  1   0             Patient Active Problem List   Diagnosis Date Noted  . PVC (premature ventricular contraction) 11/08/2014  . OA (osteoarthritis) of knee 12/11/2013  . Vaginal atrophy 08/05/2011  . Arthritis 10/08/2010  . GERD (gastroesophageal reflux disease) 10/08/2010  . IBS (irritable bowel syndrome) 10/08/2010  . Depression 10/08/2010  . Fibromyalgia 10/08/2010  . Migraines 10/08/2010         Past Medical History  Diagnosis Date  . GERD (gastroesophageal reflux disease)   . IBS (irritable bowel syndrome)   . Mental disorder     depression  . Migraines   . Abnormal Pap smear     hx of colpo and cryo  . Thyroid disease   . Herpes   . Insomnia   . Depression   . Ulcer   . Arthritis     osteoarthritis  . Endometriosis   . Fibromyalgia     muscle spasms, joint pain  triggered by stress  . History of blood transfusion 75    [place]  . Arthritis   . Osteopenia   . Meniscus tear     Right knee  . Heart murmur   . Hypothyroidism   . Anxiety   . Chest pain   . SOB (shortness of breath)   . Palpitations   . Diaphoresis   . Osteoporosis 2016    began Prolia injections with Dr. Dagmar Hait 05/2014?          Past Surgical History  Procedure Laterality Date  . Cervical fusion  2002    x2  . Elbow surgery    . Tonsilectomy, adenoidectomy, bilateral myringotomy and tubes    . Appendectomy      age 51  . Laparoscopy      age 58  . Cataract extraction Bilateral 2012  . Cholecystectomy  2003  . Abdominal hysterectomy  1990  . Bartholin cyst marsupialization Right 07/05/2012    Procedure: BARTHOLIN CYST MARSUPIALIZATION;  Surgeon: Arloa Koh, MD;  Location: [facility];  Service: Gynecology;  Laterality: Right;  Excision of right Bartholin Gland  . Knee surgery Right 07/2012    menicus tear repair  . Gynecologic cryosurgery    .  Colposcopy w/ biopsy / curettage      30 years ago  . Bunionectomy      left foot   . Total knee arthroplasty Right 12/11/2013    Procedure: RIGHT TOTAL KNEE ARTHROPLASTY;  Surgeon: Gearlean Alf, MD;  Location: [facility];  Service: Orthopedics;  Laterality: Right;          Current Outpatient Prescriptions  Medication Sig Dispense Refill  . acetaminophen (TYLENOL) 500 MG tablet Take 1,000 mg by mouth once as needed for mild pain or headache.    . ARIPiprazole (ABILIFY) 5 MG tablet Take 2.5 mg by mouth at bedtime.    . cyclobenzaprine (FLEXERIL) 10 MG tablet Take 10 mg by mouth at bedtime.     Marland Kitchen denosumab (PROLIA) 60 MG/ML SOLN injection Inject 60 mg into the skin every 6 (six) months. Administer in upper arm, thigh, or abdomen    . DEXILANT 60 MG capsule Take 1 capsule by mouth daily.    [name] Calcium (STOOL SOFTENER PO) Take 3 tablets by mouth 2 (two) times  daily.    . DULoxetine (CYMBALTA) 60 MG capsule Take 60 mg by mouth every morning.     Marland Kitchen estradiol (ESTRACE) 0.5 MG tablet Take 1 tablet (0.5 mg total) by mouth daily. 90 tablet 3  . Estradiol 10 MCG TABS vaginal tablet Place 1 tablet (10 mcg total) vaginally 2 (two) times a week. 8 tablet 11  . ipratropium (ATROVENT) 0.03 % nasal spray Place 2 sprays into both nostrils daily.    Marland Kitchen levothyroxine (SYNTHROID, LEVOTHROID) 125 MCG tablet Take 125 mcg by mouth daily before breakfast. One hour before meal.    . Linaclotide (LINZESS) 290 MCG CAPS Take 1 capsule by mouth every morning. Takes with a glass of water.    Marland Kitchen LORazepam (ATIVAN) 1 MG tablet Take 2 mg by mouth at bedtime.    Marland Kitchen MAGNESIUM PO Take 3-4 tablets by mouth 2 (two) times daily. 3 tablets in the am and 4 tablets at night    . methocarbamol (ROBAXIN) 500 MG tablet Take 1 tablet (500 mg total) by mouth every 8 (eight) hours as needed for muscle spasms. 80 tablet 0  . metoprolol succinate (TOPROL XL) 25 MG 24 hr tablet Take 1 tablet (25 mg total) by mouth daily. 90 tablet 3  . NONFORMULARY OR COMPOUNDED ITEM Testosterone propionate 2% in white petrolatum, apply bid for 6 weeks and then daily as directed.  60 grams. (Patient taking differently: Apply 1 application topically daily. Testosterone propionate 2% in white petrolatum, apply bid for 6 weeks and then daily as directed.  60 grams.) 60 each 0  . OVER THE COUNTER MEDICATION Take 1 capsule by mouth daily. Eye health capsule daily- l    . Polyethyl Glycol-Propyl Glycol (SYSTANE OP) Place 1 drop into both eyes 2 (two) times daily.    . propranolol (INDERAL) 10 MG tablet Take 1 tablet (10 mg total) by mouth 4 (four) times daily as needed (as needed for fast heart rate and/or palpitations). 60 tablet 6  . SUMAtriptan (IMITREX) 6 MG/0.5ML SOLN injection Inject 6 mg into the skin every 2 (two) hours as needed for migraine or headache. F    . valACYclovir (VALTREX) 500 MG tablet  TAKE 1 TABLET (500 MG TOTAL) BY MOUTH DAILY. 90 tablet 3   No current facility-administered medications for this visit.     ALLERGIES: Codeine; Doxycycline; Nsaids; Sulfa antibiotics; and Sulfamethoxazole       Family History  Problem Relation Age of Onset  . Colon cancer Father   . Heart disease Father   . Kidney failure Father   . Hypertension Father   . Alcohol abuse Father   . Stroke Mother   . Osteoporosis Mother   . Rheum arthritis Mother   . Dementia Mother   . Hypertension Mother   . Anxiety disorder Brother   . Insomnia Brother   . Depression Brother   . Alcohol abuse Brother   . Cancer Father     Social History        Social History  . Marital Status: Married    Spouse Name: N/A  . Number of Children: N/A  . Years of Education: N/A      Occupational History  . Not on file.         Social History Main Topics  . Smoking status: Never Smoker   . Smokeless tobacco: Never Used  . Alcohol Use: 7.2 - 8.4 oz/week    12-14 Glasses of wine per week     Comment: 2 glasses of wine at night  . Drug Use: No  . Sexual Activity:     Partners: Male    Birth Control/ Protection: Surgical     Comment: TAH       Other Topics Concern  . Not on file   Social History Narrative    ROS:  Pertinent items are noted in HPI.  PHYSICAL EXAMINATION:    BP 110/70 mmHg  Pulse 72  Resp 18  Ht 5\' 5"  (1.651 m)  Wt 140 lb (63.504 kg)  BMI 23.30 kg/m2  LMP 03/02/1988 (Approximate)    General appearance: alert, cooperative and appears stated age Head: Normocephalic, without obvious abnormality, atraumatic Neck: no adenopathy, supple, symmetrical, trachea midline and thyroid normal to inspection and palpation Lungs: clear to auscultation bilaterally Heart: regular rate and rhythm Abdomen:  soft, non-tender, no masses,  no organomegaly Extremities: extremities normal, atraumatic, no cyanosis or edema Skin: Skin color, texture,  turgor normal. No rashes or lesions Lymph nodes: Cervical, supraclavicular, and axillary nodes normal. No abnormal inguinal nodes palpated Neurologic: Grossly normal  Pelvic: External genitalia:  Right labia majora 0.75 cm subcutaneous cyst, nontender.  Left lateral labia majora with 3 mm sebaceous cyst.              Urethra:  normal appearing urethra with no masses, tenderness or lesions              Bartholins and Skenes: normal                 Vagina: normal appearing vagina with normal color and discharge, no lesions              Cervix: absent          Bimanual Exam:  Uterus:  uterus absent              Adnexa: no mass, fullness, tenderness            Chaperone was present for exam.  ASSESSMENT  Bilateral vulvar cysts.    PLAN  Excision of vulvar cysts in OR.  Risks and benefits reviewed.  Risks include bleeding, infection, and risk of reaction to anesthesia.  Low risk of DVT/PE. Procedure expectations and recovery explained.   She wishes to proceed.    An After Visit Summary was printed and given to the patient.  _15_____ minutes face to face time of which over 50% was spent in  counseling.

## 2015-09-25 DIAGNOSIS — M1990 Unspecified osteoarthritis, unspecified site: Secondary | ICD-10-CM | POA: Diagnosis not present

## 2015-09-25 DIAGNOSIS — N907 Vulvar cyst: Secondary | ICD-10-CM | POA: Diagnosis not present

## 2015-09-25 DIAGNOSIS — D280 Benign neoplasm of vulva: Secondary | ICD-10-CM | POA: Diagnosis not present

## 2015-09-25 DIAGNOSIS — K219 Gastro-esophageal reflux disease without esophagitis: Secondary | ICD-10-CM | POA: Diagnosis not present

## 2015-09-25 DIAGNOSIS — F329 Major depressive disorder, single episode, unspecified: Secondary | ICD-10-CM | POA: Diagnosis not present

## 2015-09-25 DIAGNOSIS — I499 Cardiac arrhythmia, unspecified: Secondary | ICD-10-CM | POA: Diagnosis not present

## 2015-09-25 DIAGNOSIS — F419 Anxiety disorder, unspecified: Secondary | ICD-10-CM | POA: Diagnosis not present

## 2015-09-25 NOTE — Op Note (Signed)
NAMECARLISLE, [name] NO.:  [id number]  MEDICAL RECORD NO.:  [id number]  LOCATION:  [facility]                          FACILITY:  [facility]  PHYSICIAN:  Lenard Galloway, M.D.   DATE OF BIRTH:  [birth year]  DATE OF PROCEDURE:  09/24/2015 DATE OF DISCHARGE:                              OPERATIVE REPORT   PREOPERATIVE DIAGNOSIS:  Bilateral vulvar cysts.  POSTOPERATIVE DIAGNOSIS:  Bilateral vulvar cysts.  PROCEDURE:  Excision of bilateral vulvar cysts.  SURGEON:  Josefa Half, MD.  ANESTHESIA:  MAC, local with 1% lidocaine.  IV FLUIDS:  1200 mL of Ringer's lactate.  ESTIMATED BLOOD LOSS:  5 mL.  URINE OUTPUT:  150 mL.  COMPLICATIONS:  None.  INDICATIONS FOR THE PROCEDURE:  The patient is a 65 year old Caucasian female who presents with a painful right vulvar cyst of the right labia majora.  The cyst has been increasing in size, and the patient was given a course of doxycycline, which did shrink this cyst slightly but also was intolerable due to upset stomach, swelling, and aching joints.  The patient reports that the cyst on the right labia does come and go and that it can become painful.  She also reports a small cyst on the left vulvar region as well.  Plan is now made to proceed with excision of the bilateral vulvar cysts after risks, benefits, and alternatives are reviewed.  FINDINGS:  Exam under anesthesia revealed a 0.75 cm deep right labia majora subcutaneous mass and a 3 mm sebaceous cyst of the left labia minora.  SPECIMENS:  The left labial cyst and the right labial cyst were sent to Pathology separately.  DESCRIPTION OF PROCEDURE:  The patient was reidentified in the preoperative hold area.  She did receive Ancef IV for antibiotic prophylaxis and TED hose and PAS stockings for DVT prophylaxis.  In the operating room, the patient was placed in the dorsal lithotomy position with Allen stirrups and MAC anesthetic was induced.  The vulva was then  sterilely prepped with Betadine and she was catheterized of urine.  The patient then received local anesthetic to the bilateral labia.  Each cyst was excised using an elliptical incision which was created with a scalpel.  The base of the excision on the right vulva was treated with monopolar cautery for hemostasis.  Both of the incisions were then closed with interrupted sutures of 3-0 Vicryl for excellent hemostasis.  The patient was cleansed of any remaining Betadine.  The right vulvar incision was dressed with a sterile gauze and surgical tape.  The patient was awakened and escorted to the recovery room in stable condition.  There were no complications to the procedure.  All needle, instrument, and sponge counts were correct.     Lenard Galloway, M.D.     BES/MEDQ  D:  09/24/2015  T:  09/25/2015  Job:  [id number]

## 2015-09-26 DIAGNOSIS — K219 Gastro-esophageal reflux disease without esophagitis: Secondary | ICD-10-CM | POA: Diagnosis not present

## 2015-09-26 DIAGNOSIS — R140 Abdominal distension (gaseous): Secondary | ICD-10-CM | POA: Diagnosis not present

## 2015-09-26 DIAGNOSIS — K5900 Constipation, unspecified: Secondary | ICD-10-CM | POA: Diagnosis not present

## 2015-09-26 DIAGNOSIS — R1013 Epigastric pain: Secondary | ICD-10-CM | POA: Diagnosis not present

## 2015-09-27 ENCOUNTER — Telehealth: Payer: Self-pay

## 2015-09-27 NOTE — Telephone Encounter (Signed)
-----   Message from Nunzio Cobbs, MD sent at 09/26/2015  8:57 PM EDT ----- Please report final surgical pathology to patient: Bilateral benign follicular cysts.  Cc- Marisa Sprinkles

## 2015-09-27 NOTE — Telephone Encounter (Signed)
Spoke with patient. Advised of results and message as seen below from [place]. She is agreeable and verbalizes understanding.  Routing to provider for final review. Patient agreeable to disposition. Will close encounter.

## 2015-09-30 ENCOUNTER — Encounter (HOSPITAL_COMMUNITY): Payer: Self-pay | Admitting: Obstetrics and Gynecology

## 2015-10-07 ENCOUNTER — Encounter: Payer: Self-pay | Admitting: Obstetrics and Gynecology

## 2015-10-07 ENCOUNTER — Ambulatory Visit (INDEPENDENT_AMBULATORY_CARE_PROVIDER_SITE_OTHER): Payer: Medicare Other | Admitting: Obstetrics and Gynecology

## 2015-10-07 VITALS — BP 110/68 | HR 66 | Ht 65.0 in | Wt 142.0 lb

## 2015-10-07 DIAGNOSIS — Z9889 Other specified postprocedural states: Secondary | ICD-10-CM
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
## 2015-10-14 DIAGNOSIS — M5414 Radiculopathy, thoracic region: Secondary | ICD-10-CM | POA: Diagnosis not present

## 2015-10-14 DIAGNOSIS — M542 Cervicalgia: Secondary | ICD-10-CM | POA: Diagnosis not present

## 2015-10-14 DIAGNOSIS — M5030 Other cervical disc degeneration, unspecified cervical region: Secondary | ICD-10-CM | POA: Diagnosis not present

## 2015-10-14 DIAGNOSIS — M4722 Other spondylosis with radiculopathy, cervical region: Secondary | ICD-10-CM | POA: Diagnosis not present

## 2015-10-14 DIAGNOSIS — M546 Pain in thoracic spine: Secondary | ICD-10-CM | POA: Diagnosis not present

## 2015-10-17 DIAGNOSIS — M47817 Spondylosis without myelopathy or radiculopathy, lumbosacral region: Secondary | ICD-10-CM | POA: Diagnosis not present

## 2015-10-17 DIAGNOSIS — M4726 Other spondylosis with radiculopathy, lumbar region: Secondary | ICD-10-CM | POA: Diagnosis not present

## 2015-10-17 DIAGNOSIS — M5116 Intervertebral disc disorders with radiculopathy, lumbar region: Secondary | ICD-10-CM | POA: Diagnosis not present

## 2015-10-18 DIAGNOSIS — M4722 Other spondylosis with radiculopathy, cervical region: Secondary | ICD-10-CM | POA: Diagnosis not present

## 2015-10-18 DIAGNOSIS — M4802 Spinal stenosis, cervical region: Secondary | ICD-10-CM | POA: Diagnosis not present

## 2015-10-18 DIAGNOSIS — M5414 Radiculopathy, thoracic region: Secondary | ICD-10-CM | POA: Diagnosis not present

## 2015-10-18 DIAGNOSIS — M5124 Other intervertebral disc displacement, thoracic region: Secondary | ICD-10-CM | POA: Diagnosis not present

## 2015-10-23 DIAGNOSIS — M5412 Radiculopathy, cervical region: Secondary | ICD-10-CM | POA: Diagnosis not present

## 2015-10-23 DIAGNOSIS — M4722 Other spondylosis with radiculopathy, cervical region: Secondary | ICD-10-CM | POA: Diagnosis not present

## 2015-10-23 DIAGNOSIS — M5030 Other cervical disc degeneration, unspecified cervical region: Secondary | ICD-10-CM | POA: Diagnosis not present

## 2015-10-23 DIAGNOSIS — M542 Cervicalgia: Secondary | ICD-10-CM | POA: Diagnosis not present

## 2015-11-11 DIAGNOSIS — M5412 Radiculopathy, cervical region: Secondary | ICD-10-CM | POA: Diagnosis not present

## 2015-11-11 DIAGNOSIS — M4722 Other spondylosis with radiculopathy, cervical region: Secondary | ICD-10-CM | POA: Diagnosis not present

## 2015-11-12 DIAGNOSIS — D539 Nutritional anemia, unspecified: Secondary | ICD-10-CM | POA: Diagnosis not present

## 2015-11-12 DIAGNOSIS — G4761 Periodic limb movement disorder: Secondary | ICD-10-CM | POA: Diagnosis not present

## 2015-11-12 DIAGNOSIS — F410 Panic disorder [episodic paroxysmal anxiety] without agoraphobia: Secondary | ICD-10-CM | POA: Diagnosis not present

## 2015-11-12 DIAGNOSIS — Z23 Encounter for immunization: Secondary | ICD-10-CM | POA: Diagnosis not present

## 2015-11-12 DIAGNOSIS — M797 Fibromyalgia: Secondary | ICD-10-CM | POA: Diagnosis not present

## 2015-11-12 DIAGNOSIS — E038 Other specified hypothyroidism: Secondary | ICD-10-CM | POA: Diagnosis not present

## 2015-11-12 DIAGNOSIS — Z6823 Body mass index (BMI) 23.0-23.9, adult: Secondary | ICD-10-CM | POA: Diagnosis not present

## 2015-11-12 DIAGNOSIS — R259 Unspecified abnormal involuntary movements: Secondary | ICD-10-CM | POA: Diagnosis not present

## 2015-11-12 DIAGNOSIS — R251 Tremor, unspecified: Secondary | ICD-10-CM | POA: Diagnosis not present

## 2015-11-12 DIAGNOSIS — R0600 Dyspnea, unspecified: Secondary | ICD-10-CM | POA: Diagnosis not present

## 2015-11-21 DIAGNOSIS — M19241 Secondary osteoarthritis, right hand: Secondary | ICD-10-CM | POA: Diagnosis not present

## 2015-11-21 DIAGNOSIS — M19242 Secondary osteoarthritis, left hand: Secondary | ICD-10-CM | POA: Diagnosis not present

## 2015-11-21 DIAGNOSIS — M174 Other bilateral secondary osteoarthritis of knee: Secondary | ICD-10-CM | POA: Diagnosis not present

## 2015-11-21 DIAGNOSIS — M797 Fibromyalgia: Secondary | ICD-10-CM | POA: Diagnosis not present

## 2015-12-19 DIAGNOSIS — G43019 Migraine without aura, intractable, without status migrainosus: Secondary | ICD-10-CM | POA: Diagnosis not present

## 2015-12-19 DIAGNOSIS — G43719 Chronic migraine without aura, intractable, without status migrainosus: Secondary | ICD-10-CM | POA: Diagnosis not present

## 2015-12-31 DIAGNOSIS — M545 Low back pain: Secondary | ICD-10-CM | POA: Diagnosis not present

## 2015-12-31 DIAGNOSIS — M5412 Radiculopathy, cervical region: Secondary | ICD-10-CM | POA: Diagnosis not present

## 2015-12-31 DIAGNOSIS — M5136 Other intervertebral disc degeneration, lumbar region: Secondary | ICD-10-CM | POA: Diagnosis not present

## 2015-12-31 DIAGNOSIS — M4726 Other spondylosis with radiculopathy, lumbar region: Secondary | ICD-10-CM | POA: Diagnosis not present

## 2015-12-31 DIAGNOSIS — M542 Cervicalgia: Secondary | ICD-10-CM | POA: Diagnosis not present

## 2015-12-31 DIAGNOSIS — M5030 Other cervical disc degeneration, unspecified cervical region: Secondary | ICD-10-CM | POA: Diagnosis not present

## 2015-12-31 DIAGNOSIS — M4722 Other spondylosis with radiculopathy, cervical region: Secondary | ICD-10-CM | POA: Diagnosis not present

## 2016-02-10 DIAGNOSIS — M797 Fibromyalgia: Secondary | ICD-10-CM | POA: Diagnosis not present

## 2016-02-10 DIAGNOSIS — R209 Unspecified disturbances of skin sensation: Secondary | ICD-10-CM | POA: Diagnosis not present

## 2016-02-10 DIAGNOSIS — M810 Age-related osteoporosis without current pathological fracture: Secondary | ICD-10-CM | POA: Diagnosis not present

## 2016-02-10 DIAGNOSIS — Z6824 Body mass index (BMI) 24.0-24.9, adult: Secondary | ICD-10-CM | POA: Diagnosis not present

## 2016-02-10 DIAGNOSIS — E038 Other specified hypothyroidism: Secondary | ICD-10-CM | POA: Diagnosis not present

## 2016-02-10 DIAGNOSIS — E784 Other hyperlipidemia: Secondary | ICD-10-CM | POA: Diagnosis not present

## 2016-02-10 DIAGNOSIS — G4761 Periodic limb movement disorder: Secondary | ICD-10-CM | POA: Diagnosis not present

## 2016-02-10 DIAGNOSIS — F329 Major depressive disorder, single episode, unspecified: Secondary | ICD-10-CM | POA: Diagnosis not present

## 2016-02-13 ENCOUNTER — Other Ambulatory Visit (HOSPITAL_COMMUNITY): Payer: Self-pay | Admitting: *Deleted

## 2016-02-14 ENCOUNTER — Inpatient Hospital Stay (HOSPITAL_COMMUNITY): Admission: RE | Admit: 2016-02-14 | Payer: Medicare Other | Source: Ambulatory Visit

## 2016-02-25 ENCOUNTER — Other Ambulatory Visit (HOSPITAL_COMMUNITY): Payer: Self-pay | Admitting: *Deleted

## 2016-02-26 ENCOUNTER — Ambulatory Visit (HOSPITAL_COMMUNITY)
Admission: RE | Admit: 2016-02-26 | Discharge: 2016-02-26 | Disposition: A | Discharge status: Home / Self Care | Payer: Medicare Other | Source: Ambulatory Visit | Attending: Internal Medicine | Admitting: Internal Medicine

## 2016-02-26 DIAGNOSIS — M810 Age-related osteoporosis without current pathological fracture: Secondary | ICD-10-CM | POA: Diagnosis not present

## 2016-02-26 MED ORDER — DENOSUMAB 60 MG/ML ~~LOC~~ SOLN
60.0000 mg | Freq: Once | SUBCUTANEOUS | Status: AC
  Administered 2016-02-26: 60 mg via SUBCUTANEOUS
  Filled 2016-02-26: qty 1

## 2016-03-12 ENCOUNTER — Other Ambulatory Visit: Payer: Self-pay | Admitting: Obstetrics and Gynecology

## 2016-03-12 DIAGNOSIS — G43019 Migraine without aura, intractable, without status migrainosus: Secondary | ICD-10-CM | POA: Diagnosis not present

## 2016-03-12 DIAGNOSIS — G43719 Chronic migraine without aura, intractable, without status migrainosus: Secondary | ICD-10-CM | POA: Diagnosis not present

## 2016-03-12 NOTE — Telephone Encounter (Signed)
Medication refill request: Estradiol 0.5 Last AEX:  04/24/15 BS Next AEX: 04/24/16 BS Last MMG (if hormonal medication request): 06/11/15 BIRADS1, Density C, [facility] Refill authorized: 04/24/15 #[id number]. Please advise. Thank you.

## 2016-03-25 ENCOUNTER — Encounter: Payer: Self-pay | Admitting: Neurology

## 2016-03-25 ENCOUNTER — Ambulatory Visit (INDEPENDENT_AMBULATORY_CARE_PROVIDER_SITE_OTHER): Payer: Medicare Other | Admitting: Neurology

## 2016-03-25 VITALS — BP 115/68 | HR 72 | Resp 20 | Ht 65.0 in | Wt 147.0 lb

## 2016-03-25 DIAGNOSIS — G4761 Periodic limb movement disorder: Secondary | ICD-10-CM

## 2016-03-25 DIAGNOSIS — M25552 Pain in left hip: Secondary | ICD-10-CM

## 2016-03-25 DIAGNOSIS — R0683 Snoring: Secondary | ICD-10-CM

## 2016-03-25 DIAGNOSIS — M25551 Pain in right hip: Secondary | ICD-10-CM

## 2016-03-25 DIAGNOSIS — M25561 Pain in right knee: Secondary | ICD-10-CM

## 2016-03-25 DIAGNOSIS — G8929 Other chronic pain: Secondary | ICD-10-CM

## 2016-03-25 DIAGNOSIS — G43101 Migraine with aura, not intractable, with status migrainosus: Secondary | ICD-10-CM

## 2016-03-25 DIAGNOSIS — M25562 Pain in left knee: Secondary | ICD-10-CM

## 2016-03-25 DIAGNOSIS — G253 Myoclonus: Secondary | ICD-10-CM

## 2016-03-25 DIAGNOSIS — G2581 Restless legs syndrome: Secondary | ICD-10-CM | POA: Diagnosis not present

## 2016-03-25 DIAGNOSIS — G2589 Other specified extrapyramidal and movement disorders: Secondary | ICD-10-CM

## 2016-03-25 DIAGNOSIS — G4701 Insomnia due to medical condition: Secondary | ICD-10-CM

## 2016-03-25 NOTE — Patient Instructions (Signed)
Restless Legs Syndrome Introduction Restless legs syndrome is a condition that causes uncomfortable feelings or sensations in the legs, especially while sitting or lying down. The sensations usually cause an overwhelming urge to move the legs. The arms can also sometimes be affected. The condition can range from mild to severe. The symptoms often interfere with a person's ability to sleep. What are the causes? The cause of this condition is not known. What increases the risk? This condition is more likely to develop in:  People who are older than age 20.  Pregnant women. In general, restless legs syndrome is more common in women than in men.  People who have a family history of the condition.  People who have certain medical conditions, such as iron deficiency, kidney disease, Parkinson disease, or nerve damage.  People who take certain medicines, such as medicines for high blood pressure, nausea, colds, allergies, depression, and some heart conditions. What are the signs or symptoms? The main symptom of this condition is uncomfortable sensations in the legs. These sensations may be:  Described as pulling, tingling, prickling, throbbing, crawling, or burning.  Worse while you are sitting or lying down.  Worse during periods of rest or inactivity.  Worse at night, often interfering with your sleep.  Accompanied by a very strong urge to move your legs.  Temporarily relieved by movement of your legs. The sensations usually affect both sides of the body. The arms can also be affected, but this is rare. People who have this condition often have tiredness during the day because of their lack of sleep at night. How is this diagnosed? This condition may be diagnosed based on your description of the symptoms. You may also have tests, including blood tests, to check for other conditions that may lead to your symptoms. In some cases, you may be asked to spend some time in a sleep lab so your  sleeping can be monitored. How is this treated? Treatment for this condition is focused on managing the symptoms. Treatment may include:  Self-help and lifestyle changes.  Medicines. Follow these instructions at home:  Take medicines only as directed by your health care provider.  Try these methods to get temporary relief from the uncomfortable sensations:  Massage your legs.  Walk or stretch.  Take a cold or hot bath.  Practice good sleep habits. For example, go to bed and get up at the same time every day.  Exercise regularly.  Practice ways of relaxing, such as yoga or meditation.  Avoid caffeine and alcohol.  Do not use any tobacco products, including cigarettes, chewing tobacco, or electronic cigarettes. If you need help quitting, ask your health care provider.  Keep all follow-up visits as directed by your health care provider. This is important. Contact a health care provider if: Your symptoms do not improve with treatment, or they get worse. This information is not intended to replace advice given to you by your health care provider. Make sure you discuss any questions you have with your health care provider. Document Released: 02/06/2002 Document Revised: 07/25/2015 Document Reviewed: 02/12/2014  2017 [employer]
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
## 2016-03-26 DIAGNOSIS — Z8679 Personal history of other diseases of the circulatory system: Secondary | ICD-10-CM | POA: Insufficient documentation

## 2016-03-26 DIAGNOSIS — Z96651 Presence of right artificial knee joint: Secondary | ICD-10-CM | POA: Insufficient documentation

## 2016-03-26 DIAGNOSIS — Z8669 Personal history of other diseases of the nervous system and sense organs: Secondary | ICD-10-CM | POA: Insufficient documentation

## 2016-03-26 DIAGNOSIS — M19041 Primary osteoarthritis, right hand: Secondary | ICD-10-CM | POA: Insufficient documentation

## 2016-03-26 DIAGNOSIS — M47812 Spondylosis without myelopathy or radiculopathy, cervical region: Secondary | ICD-10-CM | POA: Insufficient documentation

## 2016-03-26 DIAGNOSIS — G5622 Lesion of ulnar nerve, left upper limb: Secondary | ICD-10-CM | POA: Insufficient documentation

## 2016-03-26 DIAGNOSIS — M19042 Primary osteoarthritis, left hand: Secondary | ICD-10-CM

## 2016-03-26 DIAGNOSIS — F5101 Primary insomnia: Secondary | ICD-10-CM | POA: Insufficient documentation

## 2016-03-26 DIAGNOSIS — M810 Age-related osteoporosis without current pathological fracture: Secondary | ICD-10-CM | POA: Insufficient documentation

## 2016-03-26 DIAGNOSIS — Z8639 Personal history of other endocrine, nutritional and metabolic disease: Secondary | ICD-10-CM | POA: Insufficient documentation

## 2016-03-26 DIAGNOSIS — M7502 Adhesive capsulitis of left shoulder: Secondary | ICD-10-CM | POA: Insufficient documentation

## 2016-03-26 DIAGNOSIS — E039 Hypothyroidism, unspecified: Secondary | ICD-10-CM | POA: Insufficient documentation

## 2016-03-26 DIAGNOSIS — Z8719 Personal history of other diseases of the digestive system: Secondary | ICD-10-CM | POA: Insufficient documentation

## 2016-03-26 DIAGNOSIS — Z8659 Personal history of other mental and behavioral disorders: Secondary | ICD-10-CM | POA: Insufficient documentation

## 2016-03-27 ENCOUNTER — Ambulatory Visit: Payer: PRIVATE HEALTH INSURANCE | Admitting: Rheumatology

## 2016-03-31 ENCOUNTER — Other Ambulatory Visit: Payer: Self-pay | Admitting: Obstetrics and Gynecology

## 2016-03-31 DIAGNOSIS — F4323 Adjustment disorder with mixed anxiety and depressed mood: Secondary | ICD-10-CM | POA: Diagnosis not present

## 2016-03-31 NOTE — Telephone Encounter (Signed)
Medication refill request: Vagifem  Last AEX:  04-24-15  Next AEX: 04-24-16  Last MMG (if hormonal medication request): 06-11-15 WNL  Refill authorized: please advise

## 2016-04-02 ENCOUNTER — Encounter: Payer: Self-pay | Admitting: Cardiovascular Disease

## 2016-04-02 ENCOUNTER — Telehealth: Payer: Self-pay | Admitting: *Deleted

## 2016-04-02 ENCOUNTER — Ambulatory Visit (INDEPENDENT_AMBULATORY_CARE_PROVIDER_SITE_OTHER): Payer: Medicare Other | Admitting: Neurology

## 2016-04-02 ENCOUNTER — Ambulatory Visit (INDEPENDENT_AMBULATORY_CARE_PROVIDER_SITE_OTHER): Payer: Self-pay | Admitting: Neurology

## 2016-04-02 ENCOUNTER — Ambulatory Visit (INDEPENDENT_AMBULATORY_CARE_PROVIDER_SITE_OTHER): Payer: Medicare Other | Admitting: Cardiovascular Disease

## 2016-04-02 VITALS — BP 106/66 | HR 69 | Ht 65.5 in | Wt 143.1 lb

## 2016-04-02 DIAGNOSIS — G4761 Periodic limb movement disorder: Secondary | ICD-10-CM

## 2016-04-02 DIAGNOSIS — R202 Paresthesia of skin: Secondary | ICD-10-CM

## 2016-04-02 DIAGNOSIS — G253 Myoclonus: Secondary | ICD-10-CM

## 2016-04-02 DIAGNOSIS — I493 Ventricular premature depolarization: Secondary | ICD-10-CM

## 2016-04-02 DIAGNOSIS — Z0289 Encounter for other administrative examinations: Secondary | ICD-10-CM

## 2016-04-02 DIAGNOSIS — G4701 Insomnia due to medical condition: Secondary | ICD-10-CM

## 2016-04-02 DIAGNOSIS — G2581 Restless legs syndrome: Secondary | ICD-10-CM

## 2016-04-02 NOTE — Patient Instructions (Signed)

## 2016-04-02 NOTE — Progress Notes (Addendum)
[facility]    Provider:  Dr Jaynee Eagles Referring Provider: Larey Seat Primary Care Physician:  Tivis Ringer, MD   HPI:  [name] is a 66 y.o. female here as a referral from Dr. Brett Fairy for evaluation of neuropathy.  Summary  Nerve conduction studies were performed on the bilateral upper and right lower extremities:  All nerves and muscles (as indicated in the following tables) were within normal limits.    EMG Needle study was performed on selected right upper and right lower extremity muscles:   The Deltoid, Triceps, Pronator Teres, Opponens Pollicis, First Dorsal Interosseous,  Vastus Medialis, Anterior Tibialis, Medial Gastrocnemius, Extensor Hallucis Longus, Abductor Hallucis muscles were within normal limits.  Conclusion: This is a normal study. No electrophysiologic evidence for peripheral polyneuropathy, mononeuropathy, radiculopathy, or neuromuscular disorder.   Cc: Dr. Brett Fairy  [facility]    Nerve / Sites Rec. Site Peak Lat Ref.  Amp Ref. Segments Distance    ms ms V V  cm  R Sural - Ankle (Calf)     Calf Ankle 3.5 ?4.4 12 ?6 Calf - Ankle 14  R Superficial peroneal - Ankle     Lat leg Ankle 2.9 ?4.4 22 ?6 Lat leg - Ankle 14  R Median - Orthodromic (Dig II, Mid palm)     Dig II Wrist 3.2 ?3.4 41 ?10 Dig II - Wrist 13  R Median, Ulnar - Transcarpal comparison     Median Palm Wrist 2.1 ?2.2 181 ?50 Median Palm - Wrist 8     Ulnar Palm Wrist 2.0 ?2.2 43 ?12 Ulnar Palm - Wrist 8        Median 3 - Ulnar Palm   R Ulnar - Orthodromic, (Dig V, Mid palm)     Dig V Wrist 2.8 ?3.1 13 ?5 Dig V - Wrist 11  L Ulnar - Orthodromic, (Dig V, Mid palm)     Dig V Wrist 2.9 ?3.1 17 ?5 Dig V - Wrist 11                 [facility]    Nerve / Sites Muscle Latency Ref. Amplitude Ref. Rel Amp Segments Distance Lat Diff Velocity Ref. Area    ms ms mV mV %  cm ms m/s m/s mVms  R Peroneal - EDB     Ankle EDB 5.3 ?6.5 5.1 ?2.0 100 Ankle - EDB 9    16.2     Fib  head EDB 11.6  4.7  93.2 Fib head - Ankle 30 6.4 47 ?44 15.7     Pop fossa EDB 13.4  4.5  95.1 Pop fossa - Fib head 10 1.8 56 ?44 15.5         Pop fossa - Ankle  8.1     R Tibial - AH     Ankle AH 4.0 ?5.8 22.3 ?4.0 100 Ankle - AH 9    41.4     Pop fossa AH 13.6  15.5  69.5 Pop fossa - Ankle 40 9.7 41 ?41 34.4  R Median - APB     Wrist APB 3.9 ?4.4 5.5 ?4.0 100 Wrist - APB 7    20.8     Upper arm APB 7.8  4.4  79.6 Upper arm - Wrist 23 4.0 58  15.1  R Ulnar - ADM     Wrist ADM 3.1 ?3.3 11.8 ?6.0 100 Wrist - ADM 7    49.3     B.Elbow ADM 6.2  11.9  101 B.Elbow - Wrist 18 3.1 59 ?49 49.0     A.Elbow ADM 8.6  11.4  95.5 A.Elbow - B.Elbow 14 2.4 58 ?49 48.7         A.Elbow - Wrist  5.5     L Ulnar - ADM     Wrist ADM 3.1 ?3.3 10.0 ?6.0 100 Wrist - ADM 7    38.7     B.Elbow ADM 6.8  9.5  95 B.Elbow - Wrist 20 3.8 53 ?49 40.1     A.Elbow ADM 9.0  9.5  99.8 A.Elbow - B.Elbow 11 2.1 52 ?49 40.0         A.Elbow - Wrist  5.9                  F  Wave    Nerve F Lat Ref.   ms ms  R Tibial - AH 53.8 ?56.0  R Median - APB 27.7 ?31.0  R Ulnar - ADM 28.8 ?32.0  L Ulnar - ADM 28.6 ?32.0             EMG full      Sarina Ill, MD  [facility] [address] [place],  [address]  Phone [phone] Fax [phone]

## 2016-04-02 NOTE — Progress Notes (Signed)
See procedure note.

## 2016-04-02 NOTE — Telephone Encounter (Signed)
Patient advised and states she will get her prescription from [facility]. Patient will advise [place] when she needs her prescription refilled.

## 2016-04-02 NOTE — Telephone Encounter (Signed)
-----   Message from [name], [name] sent at 03/26/2016 10:41 AM EST -----   ----- Message ----- From: Eliezer Lofts, PA-C Sent: 03/24/2016  12:20 PM To: [name], RT  Tell patient [employer] sent [place] a letter on 03/16/2016 Stating that cyclobenzaprine tablet 10 mg is on your formulary but requires prior authorization  We can do a prior authorization but they are requiring for you to meet certain requirements or date approve an exception for you that she don't need any prior authorization.  On option would be to buy the flexeril (after we give you a Rx)  from [name] directly without using insurance and instead using good [email or web address]. You can get 90 pills for $10  Let us know how you want [place] to proceed.    [email or web address]

## 2016-04-02 NOTE — Telephone Encounter (Signed)
Attempted to contact the patient and left message for patient to call the office.  
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
## 2016-04-04 ENCOUNTER — Other Ambulatory Visit: Payer: Self-pay | Admitting: Cardiovascular Disease

## 2016-04-04 DIAGNOSIS — I493 Ventricular premature depolarization: Secondary | ICD-10-CM

## 2016-04-05 ENCOUNTER — Ambulatory Visit (INDEPENDENT_AMBULATORY_CARE_PROVIDER_SITE_OTHER): Payer: Medicare Other | Admitting: Neurology

## 2016-04-05 DIAGNOSIS — G43101 Migraine with aura, not intractable, with status migrainosus: Secondary | ICD-10-CM

## 2016-04-05 DIAGNOSIS — G4761 Periodic limb movement disorder: Secondary | ICD-10-CM

## 2016-04-05 DIAGNOSIS — G2581 Restless legs syndrome: Secondary | ICD-10-CM

## 2016-04-05 DIAGNOSIS — R0683 Snoring: Secondary | ICD-10-CM

## 2016-04-05 NOTE — Procedures (Signed)
[facility]    Provider:  Dr Jaynee Eagles Referring Provider: Larey Seat Primary Care Physician:  Tivis Ringer, MD   HPI:  [name] is a 66 y.o. female here as a referral from Dr. Brett Fairy for evaluation of neuropathy.  Summary  Nerve conduction studies were performed on the bilateral upper and right lower extremities:  All nerves and muscles (as indicated in the following tables) were within normal limits.    EMG Needle study was performed on selected right upper and right lower extremity muscles:   The Deltoid, Triceps, Pronator Teres, Opponens Pollicis, First Dorsal Interosseous,  Vastus Medialis, Anterior Tibialis, Medial Gastrocnemius, Extensor Hallucis Longus, Abductor Hallucis muscles were within normal limits.  Conclusion: This is a normal study. No electrophysiologic evidence for peripheral polyneuropathy, mononeuropathy, radiculopathy, or neuromuscular disorder.   Cc: Dr. Brett Fairy  [facility]    Nerve / Sites Rec. Site Peak Lat Ref.  Amp Ref. Segments Distance    ms ms V V  cm  R Sural - Ankle (Calf)     Calf Ankle 3.5 ?4.4 12 ?6 Calf - Ankle 14  R Superficial peroneal - Ankle     Lat leg Ankle 2.9 ?4.4 22 ?6 Lat leg - Ankle 14  R Median - Orthodromic (Dig II, Mid palm)     Dig II Wrist 3.2 ?3.4 41 ?10 Dig II - Wrist 13  R Median, Ulnar - Transcarpal comparison     Median Palm Wrist 2.1 ?2.2 181 ?50 Median Palm - Wrist 8     Ulnar Palm Wrist 2.0 ?2.2 43 ?12 Ulnar Palm - Wrist 8        Median 3 - Ulnar Palm   R Ulnar - Orthodromic, (Dig V, Mid palm)     Dig V Wrist 2.8 ?3.1 13 ?5 Dig V - Wrist 11  L Ulnar - Orthodromic, (Dig V, Mid palm)     Dig V Wrist 2.9 ?3.1 17 ?5 Dig V - Wrist 11                 MNC    Nerve / Sites Muscle Latency Ref. Amplitude Ref. Rel Amp Segments Distance Lat Diff Velocity Ref. Area    ms ms mV mV %  cm ms m/s m/s mVms  R Peroneal - EDB     Ankle EDB 5.3 ?6.5 5.1 ?2.0 100 Ankle - EDB 9    16.2     Fib head  EDB 11.6  4.7  93.2 Fib head - Ankle 30 6.4 47 ?44 15.7     Pop fossa EDB 13.4  4.5  95.1 Pop fossa - Fib head 10 1.8 56 ?44 15.5         Pop fossa - Ankle  8.1     R Tibial - AH     Ankle AH 4.0 ?5.8 22.3 ?4.0 100 Ankle - AH 9    41.4     Pop fossa AH 13.6  15.5  69.5 Pop fossa - Ankle 40 9.7 41 ?41 34.4  R Median - APB     Wrist APB 3.9 ?4.4 5.5 ?4.0 100 Wrist - APB 7    20.8     Upper arm APB 7.8  4.4  79.6 Upper arm - Wrist 23 4.0 58  15.1  R Ulnar - ADM     Wrist ADM 3.1 ?3.3 11.8 ?6.0 100 Wrist - ADM 7    49.3     B.Elbow ADM 6.2  11.9  101 B.Elbow - Wrist 18 3.1 59 ?49 49.0     A.Elbow ADM 8.6  11.4  95.5 A.Elbow - B.Elbow 14 2.4 58 ?49 48.7         A.Elbow - Wrist  5.5     L Ulnar - ADM     Wrist ADM 3.1 ?3.3 10.0 ?6.0 100 Wrist - ADM 7    38.7     B.Elbow ADM 6.8  9.5  95 B.Elbow - Wrist 20 3.8 53 ?49 40.1     A.Elbow ADM 9.0  9.5  99.8 A.Elbow - B.Elbow 11 2.1 52 ?49 40.0         A.Elbow - Wrist  5.9                  F  Wave    Nerve F Lat Ref.   ms ms  R Tibial - AH 53.8 ?56.0  R Median - APB 27.7 ?31.0  R Ulnar - ADM 28.8 ?32.0  L Ulnar - ADM 28.6 ?32.0             EMG full      Sarina Ill, MD  [facility] [address] [place], [place] [address]  Phone [phone] Fax [phone]

## 2016-04-06 NOTE — Progress Notes (Signed)
I agree with the assessment and plan as directed by MD .The patient is known to me.    Asahel Risden, MD  

## 2016-04-07 ENCOUNTER — Telehealth: Payer: Self-pay

## 2016-04-07 NOTE — Telephone Encounter (Signed)
-----   Message from Lester Weir, RN sent at 04/06/2016  1:13 PM EST -----   ----- Message ----- From: Larey Seat, MD Sent: 04/06/2016  12:58 PM To: Cyril Mourning Dinkins, RN  Complete normal NCV and EMG- no evidence of large fiber neuropathy.

## 2016-04-07 NOTE — Telephone Encounter (Signed)
LM with results below. Left call back number for any questions.

## 2016-04-09 ENCOUNTER — Encounter: Payer: Self-pay | Admitting: Neurology

## 2016-04-09 ENCOUNTER — Telehealth: Payer: Self-pay | Admitting: Obstetrics and Gynecology

## 2016-04-09 ENCOUNTER — Telehealth: Payer: Self-pay | Admitting: Neurology

## 2016-04-09 ENCOUNTER — Other Ambulatory Visit: Payer: Self-pay | Admitting: Rheumatology

## 2016-04-09 DIAGNOSIS — F4323 Adjustment disorder with mixed anxiety and depressed mood: Secondary | ICD-10-CM | POA: Diagnosis not present

## 2016-04-09 DIAGNOSIS — G4733 Obstructive sleep apnea (adult) (pediatric): Secondary | ICD-10-CM

## 2016-04-09 MED ORDER — CYCLOBENZAPRINE HCL 10 MG PO TABS: 10.0000 mg | ORAL_TABLET | Freq: Every day | ORAL | 2 refills | Status: DC

## 2016-04-09 NOTE — Telephone Encounter (Signed)
Last Visit: 11/22/15 Next Visit: 05/08/16  Okay to refill Flexeril?

## 2016-04-09 NOTE — Telephone Encounter (Signed)
Ok for switch to transdermal estradiol 0.5 mg twice weekly.  Dispense - 8, RF none.  Has appointment for annual exam in about 2 weeks.

## 2016-04-09 NOTE — Telephone Encounter (Signed)
IMPRESSION: Results of PSG at [facility]  from 04-06-2016 .  please share with patient and inform her that CPAP therapy is strongly recommended.   1. Obstructive Sleep Apnea(OSA), overall mild but strongly accentuated by REM sleep to an AHI of 58.4/hr. 2. Borderline hypoxemia, Nadir Spo2 at 70%.  3. Primary Snoring RECOMMENDATIONS:  1. Advise full-night, attended, CPAP titration study to optimize therapy.  This type of REM dependent apnea does not respond to dental devices.  2. The patient is not overweight (BMI 24.6) therefor weight loss is not a therapy for snoring and apnea.  3. Further information regarding OSA may be obtained from [employer] (www.sleepfoundation.org) or American Sleep Apnea Association (www.sleepapnea.org). 4. A follow up appointment will be scheduled in the Sleep Clinic at [facility]. The referring provider will be notified of the results.      I certify that I have reviewed the entire raw data recording prior to the issuance of this report in accordance with the Standards of Accreditation of the [place] Academy of Sleep Medicine (AASM)      Larey Seat, MD  04-09-2016 Diplomat, American Board of Psychiatry and Neurology  Diplomat, American Board of Sleep Medicine

## 2016-04-09 NOTE — Telephone Encounter (Signed)
Patient is asking to change Estradiol to the patch form due to insurance change. Confirmed pharmacy on file.

## 2016-04-09 NOTE — Telephone Encounter (Signed)
Dr. Silva, see patient message below and advise.  

## 2016-04-09 NOTE — Telephone Encounter (Signed)
Dr. Quincy Simmonds, can you clarify the dosage for estradiol patch? 0.05mg  or 0.5mg ? Thanks.

## 2016-04-09 NOTE — Telephone Encounter (Signed)
Dosage of the estradiol patch is 0.05 mg! Thanks for checking back.

## 2016-04-09 NOTE — Telephone Encounter (Signed)
Patient states she spoke with Seth Bake about her flexeril and is requesting that it be called into [facility] at [place].
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
## 2016-04-10 MED ORDER — ESTRADIOL 0.05 MG/24HR TD PTTW: 1.0000 | MEDICATED_PATCH | TRANSDERMAL | 0 refills | Status: DC

## 2016-04-10 NOTE — Telephone Encounter (Signed)
Call to patient. Advised estradiol patch has been sent to pharmacy. Instructions provided for twice weekly application. Advised can review with provider at appointment and determine if any needed changes. Patient agreeable.   Encounter closed.

## 2016-04-14 NOTE — Telephone Encounter (Signed)
-----   Message from Larey Seat, MD sent at 04/09/2016  5:48 PM EST ----- PERIODIC LIMB MOVEMENTS:   The patient had a total of 0 Periodic Limb Movements.   The arousals were noted as: 16 were spontaneous, 0 were associated with PLMs, and  23 were associated with respiratory events.  Audio and video analysis did not show any abnormal or unusual movements, behaviors, phonations or vocalizations.   The patient took 1 bathroom break before she went to sleep, no Nocturia. Snoring was noted. EKG was in keeping with sinus rhythm (NSR).  IMPRESSION:  1. Obstructive Sleep Apnea(OSA), overall mild but strongly accentuated by REM sleep to an AHI of 58.4/hr. 2. Borderline hypoxemia, Nadir Spo2 at 70%.  3. Primary Snoring RECOMMENDATIONS:  1. Advise full-night, attended, CPAP titration study to optimize therapy.  This type of REM dependent apnea does not respond to dental devices.  2. The patient is not overweight (BMI 24.6) therefor weight loss is not a therapy for snoring and apnea.  3. Further information regarding OSA may be obtained from [employer] (www.sleepfoundation.org) or American Sleep Apnea Association (www.sleepapnea.org). 4. A follow up appointment will be scheduled in the Sleep Clinic at [facility]. The referring provider will be notified of the results.

## 2016-04-14 NOTE — Telephone Encounter (Signed)
I spoke to patient and she is aware of results and recommendations. She is willing to proceed with titration study. I will send report to PCP.  

## 2016-04-16 ENCOUNTER — Other Ambulatory Visit: Payer: Self-pay | Admitting: Obstetrics and Gynecology

## 2016-04-16 ENCOUNTER — Other Ambulatory Visit: Payer: Self-pay

## 2016-04-16 DIAGNOSIS — F4323 Adjustment disorder with mixed anxiety and depressed mood: Secondary | ICD-10-CM | POA: Diagnosis not present

## 2016-04-16 DIAGNOSIS — G4733 Obstructive sleep apnea (adult) (pediatric): Secondary | ICD-10-CM

## 2016-04-16 NOTE — Telephone Encounter (Signed)
Medication refill request: valacyclovir Last AEX:  04/24/15 BS Next AEX: 04/24/16 BS Last MMG (if hormonal medication request): 06/11/15 BIRADS1, Density C, Solis Refill authorized: 05/06/15 #90 3R. Please advise. Thank you.

## 2016-04-22 ENCOUNTER — Other Ambulatory Visit: Payer: Self-pay | Admitting: Obstetrics and Gynecology

## 2016-04-22 NOTE — Telephone Encounter (Signed)
Medication refill request: VAGIFEM 51mcg Last AEX:  04/24/15 BS Next AEX: 04/24/16 Last MMG (if hormonal medication request): 06/11/15 BIRADS 1 negative Refill authorized: 03/31/16 #8 w/0 refills; today please advise

## 2016-04-23 ENCOUNTER — Ambulatory Visit (INDEPENDENT_AMBULATORY_CARE_PROVIDER_SITE_OTHER): Payer: Medicare Other | Admitting: Neurology

## 2016-04-23 DIAGNOSIS — G4733 Obstructive sleep apnea (adult) (pediatric): Secondary | ICD-10-CM | POA: Diagnosis not present

## 2016-04-23 DIAGNOSIS — F4323 Adjustment disorder with mixed anxiety and depressed mood: Secondary | ICD-10-CM | POA: Diagnosis not present
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
## 2016-04-24 ENCOUNTER — Encounter: Payer: Self-pay | Admitting: Obstetrics and Gynecology

## 2016-04-24 ENCOUNTER — Ambulatory Visit (INDEPENDENT_AMBULATORY_CARE_PROVIDER_SITE_OTHER): Payer: Medicare Other | Admitting: Obstetrics and Gynecology

## 2016-04-24 VITALS — BP 106/64 | HR 72 | Resp 16 | Ht 64.75 in | Wt 144.0 lb

## 2016-04-24 DIAGNOSIS — Z79899 Other long term (current) drug therapy: Secondary | ICD-10-CM | POA: Diagnosis not present

## 2016-04-24 DIAGNOSIS — Z124 Encounter for screening for malignant neoplasm of cervix: Secondary | ICD-10-CM

## 2016-04-24 DIAGNOSIS — Z01419 Encounter for gynecological examination (general) (routine) without abnormal findings: Secondary | ICD-10-CM | POA: Diagnosis not present

## 2016-04-24 MED ORDER — ESTRADIOL 10 MCG VA TABS
ORAL_TABLET | VAGINAL | 11 refills | Status: DC
Start: 2016-04-24 — End: 2016-05-08

## 2016-04-24 MED ORDER — VALACYCLOVIR HCL 500 MG PO TABS: ORAL_TABLET | ORAL | 11 refills | Status: DC

## 2016-04-24 MED ORDER — ESTRADIOL 0.05 MG/24HR TD PTTW: 1.0000 | MEDICATED_PATCH | TRANSDERMAL | 11 refills | Status: DC

## 2016-04-24 NOTE — Patient Instructions (Signed)

## 2016-04-26 ENCOUNTER — Encounter: Payer: Self-pay | Admitting: Obstetrics and Gynecology

## 2016-04-27 ENCOUNTER — Telehealth: Payer: Self-pay | Admitting: Neurology

## 2016-04-27 DIAGNOSIS — G4733 Obstructive sleep apnea (adult) (pediatric): Secondary | ICD-10-CM

## 2016-04-27 NOTE — Procedures (Signed)
PATIENT'S NAME:  [name], Beckstrand DOB:      1950/05/02      MR#:    [id number]     DATE OF RECORDING: 04/05/2016 REFERRING M.D.:  Prince Solian, MD Study Performed:   CPAP  Titration HISTORY:  Pt returning for PAP titration study following a PSG performed on 04/05/2016 at [place] , which resulted in an AHI of 10.5/hr. and SPO2 nadir of 70%. The  REM AHI of 58.4 made CPAP treatment the preferred therapeutic option. Patient is concerned about CPAP tolerance.   CURRENT MEDICATIONS: Valtrex, Imitrex, Probiotic, Toprol, Robaxin, Magnesium, Ativan, Linzess, Synthroid, Atrovent, Estradiol, Cymbalta, Docusate Calcium, Dexilant, Prolia, Flexeril, Tylenol  PROCEDURE:  This is a multichannel digital polysomnogram utilizing the SomnoStar 11.2 system.  Electrodes and sensors were applied and monitored per AASM Specifications.   EEG, EOG, Chin and Limb EMG, were sampled at 200 Hz.  ECG, Snore and Nasal Pressure, Thermal Airflow, Respiratory Effort, CPAP Flow and Pressure, Oximetry was sampled at 50 Hz. Digital video and audio were recorded.      CPAP was initiated at  5 cmH20 with heated humidity per AASM split night standards and pressure was advanced to 8 cmH20 because of hypopneas, apneas and desaturations. It was evident that the patient did not tolerate CPAP well at lowest pressure, but sleep efficiency increased at 6,7 and 8 cm water.  At a PAP pressure of 8 cmH20, there was a reduction of the AHI to 0.0  with improvement of the nadir to 92%   Lights Out was at 21:16 and Lights On at 05:24. Total recording time (TRT) was 483 minutes, with a total sleep time (TST) of 308.5 minutes. The patient's sleep latency was 95  minutes with 75 minutes of wake time after sleep onset. REM latency was 355 minutes.  The  sleep efficiency was 64 %.   SLEEP ARCHITECTURE: WASO (Wake after sleep onset)  was 25 minutes.  There were 50.5 minutes in Stage N1, 252 minutes Stage N2, 18.5 minutes Stage N3 and 38 minutes in Stage REM.   The percentage of Stage N1 was 14.1%, Stage N2 was 70.2%, Stage N3 was 5.2% and Stage R (REM sleep) was 10.6%.   RESPIRATORY ANALYSIS:  There was a total of 0 respiratory events:. The patient also had 0 respiratory event related arousals (RERAs).     The total APNEA/HYPOPNEA INDEX  (AHI) was 0.0 /hr. and the total RESPIRATORY DISTURBANCE INDEX was 0.0/hr.  .  The patient spent 73 minutes of total sleep time in the supine position and 286 minutes in non-supine.  PERIODIC LIMB MOVEMENTS:   The patient had a total of 0 Periodic Limb Movements.  The arousals were noted as: 17 were spontaneous, 0 were associated with PLMs, and 23 were associated with respiratory events.  Audio and video analysis did not show any abnormal or unusual movements, behaviors, phonations or vocalizations.   The patient took bathroom breaks. Snoring was noted. EKG was in keeping with normal sinus rhythm (NSR) with PACs. .  The patient was fitted with a ResMed Airfit P10 apparatus in small size.  DIAGNOSIS CPAP titration to 8 cm water , using a ResMed Airfit P 10 in small size, heated humidity.  Poor sleep efficiency and oral venting.   PLANS/RECOMMENDATIONS:    Please schedule for an auto-titration device 5-12 cm water with Airfit P 10 , heated humidity and additional teaching through DME.  Will follow after 30 days of CPAP use and review Epworth score and  sleep diary.   A follow up appointment will be scheduled in the Sleep Clinic at [facility].   Please call [phone] with any questions.    I certify that I have reviewed the entire raw data recording prior to the issuance of this report in accordance with the Standards of Accreditation of the American Academy of Sleep Medicine (AASM)    Larey Seat, M.D.  04-27-2016  Diplomat, American Board of Psychiatry and Neurology  Diplomat, [place] [facility] Medical Director, [place] [facility]
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
## 2016-04-29 NOTE — Telephone Encounter (Signed)
-----   Message from Larey Seat, MD sent at 04/27/2016  5:03 PM EST ----- The patient was fitted with a ResMed Airfit P10 apparatus in small size.  DIAGNOSIS CPAP titration to 8 cm water allowed for complete alleviation of apnea and snoring, hypoxemia and allowed for REM sleep. The patient was using a ResMed Airfit P 10 in small size and heated humidity.   Poor sleep efficiency and oral venting were noted, but may improve with time.   PLANS/RECOMMENDATIONS:    Please schedule for an auto-titration device 5-12 cm water with an interface in small size, an  Airfit P 10 , heated humidity and additional teaching through DME.  Will follow after 30 days of CPAP use and review Epworth score and sleep habits/ diary.   A follow up appointment will be scheduled in the Sleep Clinic at [facility].   Please call [phone] with any questions.

## 2016-04-29 NOTE — Telephone Encounter (Signed)
I spoke to patient and she is aware of results and recommendations. Patient stated that she really did not want to use PAP, she did not feel that any  Of the masks were comfortable to her. I was able to make an appt with Dr. Brett Fairy next week to discuss further. Copy of report sent to PCP.

## 2016-04-30 ENCOUNTER — Encounter: Payer: PRIVATE HEALTH INSURANCE | Admitting: Neurology

## 2016-05-01 DIAGNOSIS — M5136 Other intervertebral disc degeneration, lumbar region: Secondary | ICD-10-CM | POA: Diagnosis not present

## 2016-05-01 DIAGNOSIS — M4722 Other spondylosis with radiculopathy, cervical region: Secondary | ICD-10-CM | POA: Diagnosis not present

## 2016-05-01 DIAGNOSIS — M5412 Radiculopathy, cervical region: Secondary | ICD-10-CM | POA: Diagnosis not present

## 2016-05-01 DIAGNOSIS — M542 Cervicalgia: Secondary | ICD-10-CM | POA: Diagnosis not present

## 2016-05-01 DIAGNOSIS — M5030 Other cervical disc degeneration, unspecified cervical region: Secondary | ICD-10-CM | POA: Diagnosis not present

## 2016-05-01 DIAGNOSIS — M4726 Other spondylosis with radiculopathy, lumbar region: Secondary | ICD-10-CM | POA: Diagnosis not present

## 2016-05-01 DIAGNOSIS — M545 Low back pain: Secondary | ICD-10-CM | POA: Diagnosis not present

## 2016-05-06 ENCOUNTER — Other Ambulatory Visit: Payer: Self-pay | Admitting: Obstetrics and Gynecology

## 2016-05-07 ENCOUNTER — Ambulatory Visit (INDEPENDENT_AMBULATORY_CARE_PROVIDER_SITE_OTHER): Payer: Medicare Other | Admitting: Neurology

## 2016-05-07 ENCOUNTER — Encounter: Payer: Self-pay | Admitting: Neurology

## 2016-05-07 VITALS — BP 104/60 | Resp 20 | Ht 65.0 in | Wt 141.0 lb

## 2016-05-07 DIAGNOSIS — Z789 Other specified health status: Secondary | ICD-10-CM | POA: Diagnosis not present

## 2016-05-07 DIAGNOSIS — G4733 Obstructive sleep apnea (adult) (pediatric): Secondary | ICD-10-CM | POA: Diagnosis not present

## 2016-05-07 HISTORY — DX: Obstructive sleep apnea (adult) (pediatric): G47.33
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
## 2016-05-08 ENCOUNTER — Ambulatory Visit (INDEPENDENT_AMBULATORY_CARE_PROVIDER_SITE_OTHER): Payer: Medicare Other | Admitting: Rheumatology

## 2016-05-08 ENCOUNTER — Encounter: Payer: Self-pay | Admitting: Rheumatology

## 2016-05-08 VITALS — BP 118/68 | HR 64 | Resp 14 | Ht 65.0 in | Wt 144.0 lb

## 2016-05-08 DIAGNOSIS — M7502 Adhesive capsulitis of left shoulder: Secondary | ICD-10-CM

## 2016-05-08 DIAGNOSIS — M5030 Other cervical disc degeneration, unspecified cervical region: Secondary | ICD-10-CM

## 2016-05-08 DIAGNOSIS — M25511 Pain in right shoulder: Secondary | ICD-10-CM | POA: Diagnosis not present

## 2016-05-08 DIAGNOSIS — F5101 Primary insomnia: Secondary | ICD-10-CM | POA: Diagnosis not present

## 2016-05-08 DIAGNOSIS — M170 Bilateral primary osteoarthritis of knee: Secondary | ICD-10-CM

## 2016-05-08 DIAGNOSIS — M19041 Primary osteoarthritis, right hand: Secondary | ICD-10-CM | POA: Diagnosis not present

## 2016-05-08 DIAGNOSIS — Z8639 Personal history of other endocrine, nutritional and metabolic disease: Secondary | ICD-10-CM | POA: Diagnosis not present

## 2016-05-08 DIAGNOSIS — M7712 Lateral epicondylitis, left elbow: Secondary | ICD-10-CM

## 2016-05-08 DIAGNOSIS — M797 Fibromyalgia: Secondary | ICD-10-CM

## 2016-05-08 DIAGNOSIS — Z96651 Presence of right artificial knee joint: Secondary | ICD-10-CM

## 2016-05-08 DIAGNOSIS — G8929 Other chronic pain: Secondary | ICD-10-CM

## 2016-05-08 DIAGNOSIS — Z8659 Personal history of other mental and behavioral disorders: Secondary | ICD-10-CM | POA: Diagnosis not present

## 2016-05-08 DIAGNOSIS — M810 Age-related osteoporosis without current pathological fracture: Secondary | ICD-10-CM | POA: Diagnosis not present

## 2016-05-08 DIAGNOSIS — M47812 Spondylosis without myelopathy or radiculopathy, cervical region: Secondary | ICD-10-CM

## 2016-05-08 DIAGNOSIS — M19042 Primary osteoarthritis, left hand: Secondary | ICD-10-CM
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
## 2016-05-09 ENCOUNTER — Other Ambulatory Visit: Payer: Self-pay | Admitting: Obstetrics and Gynecology

## 2016-05-14 DIAGNOSIS — F4323 Adjustment disorder with mixed anxiety and depressed mood: Secondary | ICD-10-CM | POA: Diagnosis not present

## 2016-06-10 DIAGNOSIS — F330 Major depressive disorder, recurrent, mild: Secondary | ICD-10-CM | POA: Diagnosis not present

## 2016-06-11 NOTE — Telephone Encounter (Signed)
Encounter closed

## 2016-06-11 NOTE — Telephone Encounter (Signed)
Ok to close encounter. 

## 2016-06-15 DIAGNOSIS — Z1231 Encounter for screening mammogram for malignant neoplasm of breast: Secondary | ICD-10-CM | POA: Diagnosis not present

## 2016-07-02 ENCOUNTER — Encounter: Payer: Self-pay | Admitting: Obstetrics and Gynecology

## 2016-08-04 DIAGNOSIS — M810 Age-related osteoporosis without current pathological fracture: Secondary | ICD-10-CM | POA: Diagnosis not present

## 2016-08-04 DIAGNOSIS — Z0000 Encounter for general adult medical examination without abnormal findings: Secondary | ICD-10-CM | POA: Diagnosis not present

## 2016-08-04 DIAGNOSIS — D539 Nutritional anemia, unspecified: Secondary | ICD-10-CM | POA: Diagnosis not present

## 2016-08-04 DIAGNOSIS — E038 Other specified hypothyroidism: Secondary | ICD-10-CM | POA: Diagnosis not present

## 2016-08-04 DIAGNOSIS — F330 Major depressive disorder, recurrent, mild: Secondary | ICD-10-CM | POA: Diagnosis not present

## 2016-08-04 DIAGNOSIS — E784 Other hyperlipidemia: Secondary | ICD-10-CM | POA: Diagnosis not present

## 2016-08-07 DIAGNOSIS — Z1389 Encounter for screening for other disorder: Secondary | ICD-10-CM | POA: Diagnosis not present

## 2016-08-07 DIAGNOSIS — M810 Age-related osteoporosis without current pathological fracture: Secondary | ICD-10-CM | POA: Diagnosis not present

## 2016-08-07 DIAGNOSIS — E784 Other hyperlipidemia: Secondary | ICD-10-CM | POA: Diagnosis not present

## 2016-08-07 DIAGNOSIS — M797 Fibromyalgia: Secondary | ICD-10-CM | POA: Diagnosis not present

## 2016-08-07 DIAGNOSIS — E038 Other specified hypothyroidism: Secondary | ICD-10-CM | POA: Diagnosis not present

## 2016-08-07 DIAGNOSIS — D692 Other nonthrombocytopenic purpura: Secondary | ICD-10-CM | POA: Diagnosis not present

## 2016-08-07 DIAGNOSIS — G4733 Obstructive sleep apnea (adult) (pediatric): Secondary | ICD-10-CM | POA: Diagnosis not present

## 2016-08-07 DIAGNOSIS — K219 Gastro-esophageal reflux disease without esophagitis: Secondary | ICD-10-CM | POA: Diagnosis not present

## 2016-08-07 DIAGNOSIS — Z0000 Encounter for general adult medical examination without abnormal findings: Secondary | ICD-10-CM | POA: Diagnosis not present

## 2016-08-07 DIAGNOSIS — F329 Major depressive disorder, single episode, unspecified: Secondary | ICD-10-CM | POA: Diagnosis not present

## 2016-08-07 DIAGNOSIS — R208 Other disturbances of skin sensation: Secondary | ICD-10-CM | POA: Diagnosis not present

## 2016-08-07 DIAGNOSIS — Z6823 Body mass index (BMI) 23.0-23.9, adult: Secondary | ICD-10-CM | POA: Diagnosis not present

## 2016-08-10 DIAGNOSIS — Z1212 Encounter for screening for malignant neoplasm of rectum: Secondary | ICD-10-CM | POA: Diagnosis not present

## 2016-08-13 DIAGNOSIS — Z86010 Personal history of colonic polyps: Secondary | ICD-10-CM | POA: Diagnosis not present

## 2016-08-13 DIAGNOSIS — K581 Irritable bowel syndrome with constipation: Secondary | ICD-10-CM | POA: Diagnosis not present

## 2016-08-13 DIAGNOSIS — K219 Gastro-esophageal reflux disease without esophagitis: Secondary | ICD-10-CM | POA: Diagnosis not present

## 2016-08-13 DIAGNOSIS — R140 Abdominal distension (gaseous): Secondary | ICD-10-CM | POA: Diagnosis not present

## 2016-08-14 ENCOUNTER — Other Ambulatory Visit (HOSPITAL_COMMUNITY): Payer: Self-pay

## 2016-08-17 ENCOUNTER — Ambulatory Visit (HOSPITAL_COMMUNITY)
Admission: RE | Admit: 2016-08-17 | Discharge: 2016-08-17 | Disposition: A | Discharge status: Home / Self Care | Payer: Medicare Other | Source: Ambulatory Visit | Attending: Internal Medicine | Admitting: Internal Medicine

## 2016-08-17 ENCOUNTER — Encounter (HOSPITAL_COMMUNITY): Payer: Medicare Other

## 2016-08-17 DIAGNOSIS — M810 Age-related osteoporosis without current pathological fracture: Secondary | ICD-10-CM | POA: Insufficient documentation

## 2016-08-17 MED ORDER — DENOSUMAB 60 MG/ML ~~LOC~~ SOLN
60.0000 mg | Freq: Once | SUBCUTANEOUS | Status: AC
  Administered 2016-08-17: 13:00:00 60 mg via SUBCUTANEOUS
  Filled 2016-08-17: qty 1

## 2016-08-18 DIAGNOSIS — F330 Major depressive disorder, recurrent, mild: Secondary | ICD-10-CM | POA: Diagnosis not present

## 2016-08-19 DIAGNOSIS — F4323 Adjustment disorder with mixed anxiety and depressed mood: Secondary | ICD-10-CM | POA: Diagnosis not present

## 2016-08-21 ENCOUNTER — Telehealth: Payer: Self-pay

## 2016-08-21 DIAGNOSIS — R509 Fever, unspecified: Secondary | ICD-10-CM | POA: Diagnosis not present

## 2016-08-21 DIAGNOSIS — E038 Other specified hypothyroidism: Secondary | ICD-10-CM | POA: Diagnosis not present

## 2016-08-21 DIAGNOSIS — M791 Myalgia: Secondary | ICD-10-CM | POA: Diagnosis not present

## 2016-08-21 DIAGNOSIS — R3121 Asymptomatic microscopic hematuria: Secondary | ICD-10-CM | POA: Diagnosis not present

## 2016-08-21 DIAGNOSIS — Z6823 Body mass index (BMI) 23.0-23.9, adult: Secondary | ICD-10-CM | POA: Diagnosis not present

## 2016-08-21 DIAGNOSIS — M5489 Other dorsalgia: Secondary | ICD-10-CM | POA: Diagnosis not present

## 2016-08-21 NOTE — Telephone Encounter (Signed)
I called patient. She states she's running 101 fever. She also feels achy all over. I've advised her to see her PCP or go to the urgent care. She voices understanding and will be going to see her PCP.

## 2016-08-21 NOTE — Telephone Encounter (Signed)
Patient stated that she is having pain all over her body.  Stated that it started in her back, but thinks she may be having a Fibromyalgia  Flare. Has been like this for a couple of days. CB# is [phone].  Please Advise.

## 2016-08-21 NOTE — Telephone Encounter (Signed)
Patient states she started with pain in her lower back and off about 1 month ago. She has an appointment with Dr. Rita Ohara for an injection in her back next week. Patient states 2-3 days ago she started with a tender spot below her right breast , and now she is experiencing pain in her shoulders, hands, elbows, knees, left heel and neck. Patient states it has progressively gotten worse. Patient states it is a aching pain. And then occasionally a sharp shooting pain in her leg.  Patient also has numbness in right finger tips. Please advise.

## 2016-08-24 DIAGNOSIS — Z6823 Body mass index (BMI) 23.0-23.9, adult: Secondary | ICD-10-CM | POA: Diagnosis not present

## 2016-08-24 DIAGNOSIS — E784 Other hyperlipidemia: Secondary | ICD-10-CM | POA: Diagnosis not present

## 2016-08-24 DIAGNOSIS — M797 Fibromyalgia: Secondary | ICD-10-CM | POA: Diagnosis not present

## 2016-08-24 DIAGNOSIS — M791 Myalgia: Secondary | ICD-10-CM | POA: Diagnosis not present

## 2016-08-24 DIAGNOSIS — R3121 Asymptomatic microscopic hematuria: Secondary | ICD-10-CM | POA: Diagnosis not present

## 2016-08-24 DIAGNOSIS — R509 Fever, unspecified: Secondary | ICD-10-CM | POA: Diagnosis not present

## 2016-08-24 DIAGNOSIS — R21 Rash and other nonspecific skin eruption: Secondary | ICD-10-CM | POA: Diagnosis not present

## 2016-08-27 DIAGNOSIS — F4323 Adjustment disorder with mixed anxiety and depressed mood: Secondary | ICD-10-CM | POA: Diagnosis not present

## 2016-09-10 DIAGNOSIS — M5416 Radiculopathy, lumbar region: Secondary | ICD-10-CM | POA: Diagnosis not present

## 2016-09-10 DIAGNOSIS — M47816 Spondylosis without myelopathy or radiculopathy, lumbar region: Secondary | ICD-10-CM | POA: Diagnosis not present

## 2016-09-14 DIAGNOSIS — F4323 Adjustment disorder with mixed anxiety and depressed mood: Secondary | ICD-10-CM | POA: Diagnosis not present

## 2016-09-22 ENCOUNTER — Other Ambulatory Visit: Payer: Self-pay | Admitting: Rheumatology

## 2016-09-22 MED ORDER — METHOCARBAMOL 500 MG PO TABS: ORAL_TABLET | ORAL | 0 refills | Status: DC

## 2016-09-22 NOTE — Telephone Encounter (Signed)
Patient is asking for a refill on Robaxin. We have not filled this prescription since 2016. Patient is on Flexeril at bedtime.  Last visit: 11/22/15 Next Visit: 11/17/16  Okay to refill Robaxin?

## 2016-09-22 NOTE — Telephone Encounter (Signed)
Patient needs new RX for Robaxin. Patient used as need ed, and original has now expired. Patient uses [facility] [address]. Please call patient to advise.

## 2016-09-25 DIAGNOSIS — H9313 Tinnitus, bilateral: Secondary | ICD-10-CM | POA: Diagnosis not present

## 2016-09-25 DIAGNOSIS — H918X1 Other specified hearing loss, right ear: Secondary | ICD-10-CM | POA: Diagnosis not present

## 2016-09-25 DIAGNOSIS — H903 Sensorineural hearing loss, bilateral: Secondary | ICD-10-CM | POA: Diagnosis not present

## 2016-10-15 DIAGNOSIS — F4323 Adjustment disorder with mixed anxiety and depressed mood: Secondary | ICD-10-CM | POA: Diagnosis not present

## 2016-10-22 DIAGNOSIS — F4323 Adjustment disorder with mixed anxiety and depressed mood: Secondary | ICD-10-CM | POA: Diagnosis not present

## 2016-10-29 DIAGNOSIS — F4323 Adjustment disorder with mixed anxiety and depressed mood: Secondary | ICD-10-CM | POA: Diagnosis not present

## 2016-11-03 DIAGNOSIS — M545 Low back pain: Secondary | ICD-10-CM | POA: Diagnosis not present

## 2016-11-03 DIAGNOSIS — M5136 Other intervertebral disc degeneration, lumbar region: Secondary | ICD-10-CM | POA: Diagnosis not present

## 2016-11-03 DIAGNOSIS — M4726 Other spondylosis with radiculopathy, lumbar region: Secondary | ICD-10-CM | POA: Diagnosis not present
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
## 2016-11-17 ENCOUNTER — Ambulatory Visit (INDEPENDENT_AMBULATORY_CARE_PROVIDER_SITE_OTHER): Payer: Medicare Other | Admitting: Rheumatology

## 2016-11-17 ENCOUNTER — Encounter: Payer: Self-pay | Admitting: Rheumatology

## 2016-11-17 VITALS — BP 116/60 | HR 60 | Ht 65.5 in | Wt 146.0 lb

## 2016-11-17 DIAGNOSIS — E559 Vitamin D deficiency, unspecified: Secondary | ICD-10-CM

## 2016-11-17 DIAGNOSIS — M19041 Primary osteoarthritis, right hand: Secondary | ICD-10-CM | POA: Diagnosis not present

## 2016-11-17 DIAGNOSIS — M5030 Other cervical disc degeneration, unspecified cervical region: Secondary | ICD-10-CM

## 2016-11-17 DIAGNOSIS — Z8719 Personal history of other diseases of the digestive system: Secondary | ICD-10-CM

## 2016-11-17 DIAGNOSIS — Z8659 Personal history of other mental and behavioral disorders: Secondary | ICD-10-CM | POA: Diagnosis not present

## 2016-11-17 DIAGNOSIS — F5101 Primary insomnia: Secondary | ICD-10-CM

## 2016-11-17 DIAGNOSIS — Z96651 Presence of right artificial knee joint: Secondary | ICD-10-CM | POA: Diagnosis not present

## 2016-11-17 DIAGNOSIS — M1712 Unilateral primary osteoarthritis, left knee: Secondary | ICD-10-CM | POA: Diagnosis not present

## 2016-11-17 DIAGNOSIS — M542 Cervicalgia: Secondary | ICD-10-CM | POA: Diagnosis not present

## 2016-11-17 DIAGNOSIS — M797 Fibromyalgia: Secondary | ICD-10-CM | POA: Diagnosis not present

## 2016-11-17 DIAGNOSIS — M7502 Adhesive capsulitis of left shoulder: Secondary | ICD-10-CM

## 2016-11-17 DIAGNOSIS — M810 Age-related osteoporosis without current pathological fracture: Secondary | ICD-10-CM | POA: Diagnosis not present

## 2016-11-17 DIAGNOSIS — M19042 Primary osteoarthritis, left hand: Secondary | ICD-10-CM

## 2016-11-17 DIAGNOSIS — Z8669 Personal history of other diseases of the nervous system and sense organs: Secondary | ICD-10-CM | POA: Diagnosis not present

## 2016-11-17 MED ORDER — TRIAMCINOLONE ACETONIDE 40 MG/ML IJ SUSP
10.0000 mg | INTRAMUSCULAR | Status: AC | PRN
  Administered 2016-11-17: 10 mg via INTRAMUSCULAR

## 2016-11-17 MED ORDER — LIDOCAINE HCL 1 % IJ SOLN
0.5000 mL | INTRAMUSCULAR | Status: AC | PRN
  Administered 2016-11-17: .5 mL

## 2016-11-28 ENCOUNTER — Other Ambulatory Visit: Payer: Self-pay | Admitting: Rheumatology

## 2016-11-30 NOTE — Telephone Encounter (Signed)
Last visit: 11/22/15 Next Visit: 11/17/16  Okay to refill per Dr. Estanislado Pandy

## 2016-12-01 DIAGNOSIS — Z23 Encounter for immunization: Secondary | ICD-10-CM | POA: Diagnosis not present

## 2016-12-21 DIAGNOSIS — F330 Major depressive disorder, recurrent, mild: Secondary | ICD-10-CM | POA: Diagnosis not present

## 2016-12-28 DIAGNOSIS — F330 Major depressive disorder, recurrent, mild: Secondary | ICD-10-CM | POA: Diagnosis not present

## 2017-01-15 DIAGNOSIS — F330 Major depressive disorder, recurrent, mild: Secondary | ICD-10-CM | POA: Diagnosis not present

## 2017-01-19 DIAGNOSIS — F330 Major depressive disorder, recurrent, mild: Secondary | ICD-10-CM | POA: Diagnosis not present

## 2017-01-27 DIAGNOSIS — F330 Major depressive disorder, recurrent, mild: Secondary | ICD-10-CM | POA: Diagnosis not present

## 2017-02-02 DIAGNOSIS — F330 Major depressive disorder, recurrent, mild: Secondary | ICD-10-CM | POA: Diagnosis not present

## 2017-02-02 DIAGNOSIS — E7849 Other hyperlipidemia: Secondary | ICD-10-CM | POA: Diagnosis not present

## 2017-02-04 DIAGNOSIS — F3289 Other specified depressive episodes: Secondary | ICD-10-CM | POA: Diagnosis not present

## 2017-02-04 DIAGNOSIS — Z6824 Body mass index (BMI) 24.0-24.9, adult: Secondary | ICD-10-CM | POA: Diagnosis not present

## 2017-02-04 DIAGNOSIS — G4733 Obstructive sleep apnea (adult) (pediatric): Secondary | ICD-10-CM | POA: Diagnosis not present

## 2017-02-04 DIAGNOSIS — E7849 Other hyperlipidemia: Secondary | ICD-10-CM | POA: Diagnosis not present

## 2017-02-04 DIAGNOSIS — E038 Other specified hypothyroidism: Secondary | ICD-10-CM | POA: Diagnosis not present

## 2017-02-04 DIAGNOSIS — R42 Dizziness and giddiness: Secondary | ICD-10-CM | POA: Diagnosis not present

## 2017-02-17 DIAGNOSIS — F330 Major depressive disorder, recurrent, mild: Secondary | ICD-10-CM | POA: Diagnosis not present

## 2017-03-04 DIAGNOSIS — F330 Major depressive disorder, recurrent, mild: Secondary | ICD-10-CM | POA: Diagnosis not present

## 2017-03-22 ENCOUNTER — Other Ambulatory Visit: Payer: Self-pay | Admitting: Cardiovascular Disease

## 2017-03-22 DIAGNOSIS — M25512 Pain in left shoulder: Secondary | ICD-10-CM | POA: Diagnosis not present

## 2017-03-22 DIAGNOSIS — M7712 Lateral epicondylitis, left elbow: Secondary | ICD-10-CM | POA: Diagnosis not present

## 2017-03-22 DIAGNOSIS — M25522 Pain in left elbow: Secondary | ICD-10-CM | POA: Diagnosis not present

## 2017-03-22 DIAGNOSIS — I493 Ventricular premature depolarization: Secondary | ICD-10-CM

## 2017-03-22 DIAGNOSIS — M6530 Trigger finger, unspecified finger: Secondary | ICD-10-CM | POA: Diagnosis not present

## 2017-03-27 ENCOUNTER — Other Ambulatory Visit: Payer: Self-pay | Admitting: Obstetrics and Gynecology

## 2017-03-29 NOTE — Telephone Encounter (Signed)
Medication refill request: Vagifem Last AEX:  04/24/16 BS Next AEX: 04/30/17 Last MMG (if hormonal medication request): 06/15/16 BIRADS 1 negative/density c Refill authorized: please advise

## 2017-03-30 DIAGNOSIS — F330 Major depressive disorder, recurrent, mild: Secondary | ICD-10-CM | POA: Diagnosis not present

## 2017-04-06 DIAGNOSIS — F330 Major depressive disorder, recurrent, mild: Secondary | ICD-10-CM | POA: Diagnosis not present

## 2017-04-12 DIAGNOSIS — F330 Major depressive disorder, recurrent, mild: Secondary | ICD-10-CM | POA: Diagnosis not present

## 2017-04-13 ENCOUNTER — Telehealth: Payer: Self-pay | Admitting: Podiatry

## 2017-04-13 NOTE — Telephone Encounter (Signed)
I told pt that if it appears to be a bruise then she should watch that it fades, but if it does not fade or if she is concerned that it is a lesion she should make an appt, we have not seen her since 2016. Pt states she will watch the area and call if concerned.

## 2017-04-13 NOTE — Telephone Encounter (Signed)
I'm calling because I noticed something on the bottom of my big toe near the joint that looks like a bruise. It's not really hurting and I wanted to make sure it was something I should not worry about. I do not recall stumping my toe. If you could give me a call back at [phone] and give me some advice. Thank you.

## 2017-04-15 DIAGNOSIS — F330 Major depressive disorder, recurrent, mild: Secondary | ICD-10-CM | POA: Diagnosis not present

## 2017-04-19 DIAGNOSIS — M25522 Pain in left elbow: Secondary | ICD-10-CM | POA: Diagnosis not present

## 2017-04-19 DIAGNOSIS — M7712 Lateral epicondylitis, left elbow: Secondary | ICD-10-CM | POA: Diagnosis not present

## 2017-04-20 DIAGNOSIS — F330 Major depressive disorder, recurrent, mild: Secondary | ICD-10-CM | POA: Diagnosis not present

## 2017-04-22 ENCOUNTER — Other Ambulatory Visit: Payer: Self-pay | Admitting: Cardiovascular Disease

## 2017-04-22 DIAGNOSIS — Z86010 Personal history of colonic polyps: Secondary | ICD-10-CM | POA: Diagnosis not present

## 2017-04-22 DIAGNOSIS — Z1211 Encounter for screening for malignant neoplasm of colon: Secondary | ICD-10-CM | POA: Diagnosis not present

## 2017-04-22 DIAGNOSIS — K625 Hemorrhage of anus and rectum: Secondary | ICD-10-CM | POA: Diagnosis not present

## 2017-04-22 DIAGNOSIS — I493 Ventricular premature depolarization: Secondary | ICD-10-CM

## 2017-04-22 DIAGNOSIS — K5900 Constipation, unspecified: Secondary | ICD-10-CM | POA: Diagnosis not present

## 2017-04-22 DIAGNOSIS — K219 Gastro-esophageal reflux disease without esophagitis: Secondary | ICD-10-CM | POA: Diagnosis not present

## 2017-04-26 DIAGNOSIS — R3911 Hesitancy of micturition: Secondary | ICD-10-CM | POA: Diagnosis not present

## 2017-04-26 DIAGNOSIS — Z6825 Body mass index (BMI) 25.0-25.9, adult: Secondary | ICD-10-CM | POA: Diagnosis not present

## 2017-04-26 DIAGNOSIS — J111 Influenza due to unidentified influenza virus with other respiratory manifestations: Secondary | ICD-10-CM | POA: Diagnosis not present

## 2017-04-26 DIAGNOSIS — J329 Chronic sinusitis, unspecified: Secondary | ICD-10-CM | POA: Diagnosis not present

## 2017-04-30 ENCOUNTER — Encounter: Payer: Self-pay | Admitting: Obstetrics and Gynecology

## 2017-04-30 ENCOUNTER — Ambulatory Visit (INDEPENDENT_AMBULATORY_CARE_PROVIDER_SITE_OTHER): Payer: Medicare Other | Admitting: Obstetrics and Gynecology

## 2017-04-30 ENCOUNTER — Other Ambulatory Visit: Payer: Self-pay

## 2017-04-30 ENCOUNTER — Ambulatory Visit: Payer: Medicare Other | Admitting: Obstetrics and Gynecology

## 2017-04-30 VITALS — BP 114/72 | HR 72 | Resp 16 | Ht 64.75 in | Wt 148.0 lb

## 2017-04-30 DIAGNOSIS — Z01419 Encounter for gynecological examination (general) (routine) without abnormal findings: Secondary | ICD-10-CM | POA: Diagnosis not present

## 2017-04-30 DIAGNOSIS — Z124 Encounter for screening for malignant neoplasm of cervix: Secondary | ICD-10-CM

## 2017-04-30 MED ORDER — ESTRADIOL 10 MCG VA TABS: ORAL_TABLET | VAGINAL | 11 refills | Status: DC

## 2017-04-30 MED ORDER — VALACYCLOVIR HCL 500 MG PO TABS: ORAL_TABLET | ORAL | 11 refills | Status: DC
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
## 2017-04-30 NOTE — Patient Instructions (Signed)

## 2017-05-04 DIAGNOSIS — M546 Pain in thoracic spine: Secondary | ICD-10-CM | POA: Diagnosis not present

## 2017-05-04 DIAGNOSIS — M545 Low back pain: Secondary | ICD-10-CM | POA: Diagnosis not present

## 2017-05-04 DIAGNOSIS — M5136 Other intervertebral disc degeneration, lumbar region: Secondary | ICD-10-CM | POA: Diagnosis not present

## 2017-05-04 DIAGNOSIS — F330 Major depressive disorder, recurrent, mild: Secondary | ICD-10-CM | POA: Diagnosis not present

## 2017-05-04 DIAGNOSIS — M5414 Radiculopathy, thoracic region: Secondary | ICD-10-CM | POA: Diagnosis not present

## 2017-05-04 DIAGNOSIS — R030 Elevated blood-pressure reading, without diagnosis of hypertension: Secondary | ICD-10-CM | POA: Diagnosis not present

## 2017-05-04 DIAGNOSIS — M4726 Other spondylosis with radiculopathy, lumbar region: Secondary | ICD-10-CM | POA: Diagnosis not present
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
## 2017-05-06 DIAGNOSIS — M7702 Medial epicondylitis, left elbow: Secondary | ICD-10-CM | POA: Diagnosis not present

## 2017-05-06 DIAGNOSIS — G5622 Lesion of ulnar nerve, left upper limb: Secondary | ICD-10-CM | POA: Diagnosis not present

## 2017-05-06 DIAGNOSIS — M7712 Lateral epicondylitis, left elbow: Secondary | ICD-10-CM | POA: Diagnosis not present

## 2017-05-11 DIAGNOSIS — F330 Major depressive disorder, recurrent, mild: Secondary | ICD-10-CM | POA: Diagnosis not present

## 2017-05-12 DIAGNOSIS — M5414 Radiculopathy, thoracic region: Secondary | ICD-10-CM | POA: Diagnosis not present

## 2017-05-12 DIAGNOSIS — M5126 Other intervertebral disc displacement, lumbar region: Secondary | ICD-10-CM | POA: Diagnosis not present

## 2017-05-12 DIAGNOSIS — M5124 Other intervertebral disc displacement, thoracic region: Secondary | ICD-10-CM | POA: Diagnosis not present

## 2017-05-12 DIAGNOSIS — M5136 Other intervertebral disc degeneration, lumbar region: Secondary | ICD-10-CM | POA: Diagnosis not present
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
## 2017-05-14 DIAGNOSIS — M5136 Other intervertebral disc degeneration, lumbar region: Secondary | ICD-10-CM | POA: Diagnosis not present

## 2017-05-14 DIAGNOSIS — M4726 Other spondylosis with radiculopathy, lumbar region: Secondary | ICD-10-CM | POA: Diagnosis not present

## 2017-05-14 DIAGNOSIS — M545 Low back pain: Secondary | ICD-10-CM | POA: Diagnosis not present

## 2017-05-18 ENCOUNTER — Ambulatory Visit: Payer: PRIVATE HEALTH INSURANCE | Admitting: Physician Assistant

## 2017-05-20 ENCOUNTER — Other Ambulatory Visit: Payer: Self-pay | Admitting: Obstetrics and Gynecology
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
## 2017-05-24 DIAGNOSIS — F330 Major depressive disorder, recurrent, mild: Secondary | ICD-10-CM | POA: Diagnosis not present

## 2017-05-25 ENCOUNTER — Ambulatory Visit: Payer: PRIVATE HEALTH INSURANCE | Admitting: Physician Assistant

## 2017-05-25 DIAGNOSIS — Z6824 Body mass index (BMI) 24.0-24.9, adult: Secondary | ICD-10-CM | POA: Diagnosis not present

## 2017-05-25 DIAGNOSIS — R10817 Generalized abdominal tenderness: Secondary | ICD-10-CM | POA: Diagnosis not present

## 2017-05-26 ENCOUNTER — Ambulatory Visit (INDEPENDENT_AMBULATORY_CARE_PROVIDER_SITE_OTHER): Payer: Medicare Other | Admitting: Physician Assistant

## 2017-05-26 ENCOUNTER — Encounter: Payer: Self-pay | Admitting: Physician Assistant

## 2017-05-26 VITALS — BP 108/77 | HR 80 | Resp 12 | Wt 150.0 lb

## 2017-05-26 DIAGNOSIS — Z96651 Presence of right artificial knee joint: Secondary | ICD-10-CM | POA: Diagnosis not present

## 2017-05-26 DIAGNOSIS — Z8669 Personal history of other diseases of the nervous system and sense organs: Secondary | ICD-10-CM

## 2017-05-26 DIAGNOSIS — M5030 Other cervical disc degeneration, unspecified cervical region: Secondary | ICD-10-CM | POA: Diagnosis not present

## 2017-05-26 DIAGNOSIS — F5101 Primary insomnia: Secondary | ICD-10-CM

## 2017-05-26 DIAGNOSIS — M810 Age-related osteoporosis without current pathological fracture: Secondary | ICD-10-CM | POA: Diagnosis not present

## 2017-05-26 DIAGNOSIS — Z8659 Personal history of other mental and behavioral disorders: Secondary | ICD-10-CM

## 2017-05-26 DIAGNOSIS — M1712 Unilateral primary osteoarthritis, left knee: Secondary | ICD-10-CM

## 2017-05-26 DIAGNOSIS — M19041 Primary osteoarthritis, right hand: Secondary | ICD-10-CM | POA: Diagnosis not present

## 2017-05-26 DIAGNOSIS — M19042 Primary osteoarthritis, left hand: Secondary | ICD-10-CM

## 2017-05-26 DIAGNOSIS — Z8719 Personal history of other diseases of the digestive system: Secondary | ICD-10-CM | POA: Diagnosis not present

## 2017-05-26 DIAGNOSIS — M7502 Adhesive capsulitis of left shoulder: Secondary | ICD-10-CM | POA: Diagnosis not present

## 2017-05-26 DIAGNOSIS — E559 Vitamin D deficiency, unspecified: Secondary | ICD-10-CM | POA: Diagnosis not present

## 2017-05-26 DIAGNOSIS — M797 Fibromyalgia: Secondary | ICD-10-CM

## 2017-05-26 MED ORDER — CYCLOBENZAPRINE HCL 10 MG PO TABS: 10.0000 mg | ORAL_TABLET | Freq: Every day | ORAL | 2 refills | Status: DC

## 2017-05-26 NOTE — Patient Instructions (Signed)

## 2017-05-27 ENCOUNTER — Other Ambulatory Visit: Payer: Self-pay | Admitting: Family Medicine

## 2017-05-27 DIAGNOSIS — R109 Unspecified abdominal pain: Secondary | ICD-10-CM

## 2017-05-31 ENCOUNTER — Telehealth: Payer: Self-pay

## 2017-05-31 DIAGNOSIS — F330 Major depressive disorder, recurrent, mild: Secondary | ICD-10-CM | POA: Diagnosis not present

## 2017-05-31 NOTE — Telephone Encounter (Signed)
-----   Message from [place] sent at 05/26/2017  2:02 PM EDT ----- Regarding: Prolia Patient currently receives Prolia injections at [facility]. Can you check with her insurance to see if it will cover injecting in the office? I believe her PCP currently orders it.  Thanks!

## 2017-05-31 NOTE — Telephone Encounter (Signed)
Ran a test claim for Prolia through [facility] with pts PartD plan. Prior authorization is not required. Patient has a co-pay of $450.91.   Called pt to update. Patient plans to continue getting prolia at [facility]. She is prescribed Prolia by her PCP. She will check in with them to schedule her next injection.   Tamaka Sawin, [place], CPhT 9:13 AM

## 2017-06-04 ENCOUNTER — Ambulatory Visit
Admission: RE | Admit: 2017-06-04 | Discharge: 2017-06-04 | Disposition: A | Discharge status: Home / Self Care | Payer: Medicare Other | Source: Ambulatory Visit | Attending: Family Medicine | Admitting: Family Medicine

## 2017-06-04 DIAGNOSIS — R3129 Other microscopic hematuria: Secondary | ICD-10-CM | POA: Diagnosis not present

## 2017-06-04 DIAGNOSIS — R109 Unspecified abdominal pain: Secondary | ICD-10-CM

## 2017-06-07 ENCOUNTER — Encounter (HOSPITAL_COMMUNITY): Payer: Medicare Other

## 2017-06-07 ENCOUNTER — Other Ambulatory Visit (HOSPITAL_COMMUNITY): Payer: Self-pay | Admitting: *Deleted

## 2017-06-07 DIAGNOSIS — M47816 Spondylosis without myelopathy or radiculopathy, lumbar region: Secondary | ICD-10-CM | POA: Diagnosis not present

## 2017-06-08 ENCOUNTER — Other Ambulatory Visit: Payer: Self-pay | Admitting: Neurology

## 2017-06-08 ENCOUNTER — Ambulatory Visit (HOSPITAL_COMMUNITY)
Admission: RE | Admit: 2017-06-08 | Discharge: 2017-06-08 | Disposition: A | Discharge status: Home / Self Care | Payer: Medicare Other | Source: Ambulatory Visit | Attending: Internal Medicine | Admitting: Internal Medicine

## 2017-06-08 DIAGNOSIS — M810 Age-related osteoporosis without current pathological fracture: Secondary | ICD-10-CM | POA: Diagnosis not present

## 2017-06-08 DIAGNOSIS — F330 Major depressive disorder, recurrent, mild: Secondary | ICD-10-CM | POA: Diagnosis not present

## 2017-06-08 DIAGNOSIS — R0683 Snoring: Secondary | ICD-10-CM

## 2017-06-08 DIAGNOSIS — G4733 Obstructive sleep apnea (adult) (pediatric): Secondary | ICD-10-CM

## 2017-06-08 MED ORDER — DENOSUMAB 60 MG/ML ~~LOC~~ SOLN
60.0000 mg | Freq: Once | SUBCUTANEOUS | Status: AC
  Administered 2017-06-08: 60 mg via SUBCUTANEOUS
  Filled 2017-06-08: qty 1

## 2017-06-17 DIAGNOSIS — Z1231 Encounter for screening mammogram for malignant neoplasm of breast: Secondary | ICD-10-CM | POA: Diagnosis not present

## 2017-06-22 DIAGNOSIS — F330 Major depressive disorder, recurrent, mild: Secondary | ICD-10-CM | POA: Diagnosis not present

## 2017-06-30 ENCOUNTER — Encounter: Payer: Self-pay | Admitting: Cardiovascular Disease

## 2017-06-30 ENCOUNTER — Ambulatory Visit (INDEPENDENT_AMBULATORY_CARE_PROVIDER_SITE_OTHER): Payer: Medicare Other | Admitting: Cardiovascular Disease

## 2017-06-30 VITALS — BP 108/70 | HR 80 | Ht 65.0 in | Wt 148.8 lb

## 2017-06-30 DIAGNOSIS — I493 Ventricular premature depolarization: Secondary | ICD-10-CM | POA: Diagnosis not present

## 2017-06-30 NOTE — Patient Instructions (Signed)
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
## 2017-07-09 DIAGNOSIS — F330 Major depressive disorder, recurrent, mild: Secondary | ICD-10-CM | POA: Diagnosis not present

## 2017-07-13 ENCOUNTER — Other Ambulatory Visit: Payer: Self-pay | Admitting: Cardiovascular Disease

## 2017-07-13 DIAGNOSIS — I493 Ventricular premature depolarization: Secondary | ICD-10-CM

## 2017-07-20 DIAGNOSIS — F330 Major depressive disorder, recurrent, mild: Secondary | ICD-10-CM | POA: Diagnosis not present

## 2017-08-02 DIAGNOSIS — F330 Major depressive disorder, recurrent, mild: Secondary | ICD-10-CM | POA: Diagnosis not present

## 2017-08-03 DIAGNOSIS — M542 Cervicalgia: Secondary | ICD-10-CM | POA: Diagnosis not present

## 2017-08-03 DIAGNOSIS — M546 Pain in thoracic spine: Secondary | ICD-10-CM | POA: Diagnosis not present

## 2017-08-03 DIAGNOSIS — M5136 Other intervertebral disc degeneration, lumbar region: Secondary | ICD-10-CM | POA: Diagnosis not present

## 2017-08-03 DIAGNOSIS — M47816 Spondylosis without myelopathy or radiculopathy, lumbar region: Secondary | ICD-10-CM | POA: Diagnosis not present

## 2017-08-03 DIAGNOSIS — M545 Low back pain: Secondary | ICD-10-CM | POA: Diagnosis not present

## 2017-08-03 DIAGNOSIS — M5030 Other cervical disc degeneration, unspecified cervical region: Secondary | ICD-10-CM | POA: Diagnosis not present

## 2017-08-03 DIAGNOSIS — M47812 Spondylosis without myelopathy or radiculopathy, cervical region: Secondary | ICD-10-CM | POA: Diagnosis not present

## 2017-08-03 DIAGNOSIS — M4726 Other spondylosis with radiculopathy, lumbar region: Secondary | ICD-10-CM | POA: Diagnosis not present

## 2017-08-04 DIAGNOSIS — F330 Major depressive disorder, recurrent, mild: Secondary | ICD-10-CM | POA: Diagnosis not present

## 2017-08-10 ENCOUNTER — Telehealth: Payer: Self-pay | Admitting: Obstetrics and Gynecology

## 2017-08-10 NOTE — Telephone Encounter (Signed)
Patient is having a pinching feeling near vagina. She noticed two places: a growth near clitoris and a bump that looks like an ingrown hair.

## 2017-08-10 NOTE — Telephone Encounter (Signed)
Call to patient. Patient states that her pants started to feel like they were pinching her, so she looked as best she could and noticed a bump near her clitoris and then when she was taking a bath, she noticed a bump "about the size of a marker head" on the inner portion of her labia. Patient denies fever. RN advised OV recommended for further evaluation. Patient agreeable. Patient scheduled for OV with Dr. Quincy Simmonds on 08-12-17 at 1530. Patient agreeable to date and time of appointment. Patient advised to return call to office prior to appointment if condition worsens. Patient agreeable.   Routing to provider for final review. Patient agreeable to disposition. Will close encounter.

## 2017-08-12 ENCOUNTER — Ambulatory Visit (INDEPENDENT_AMBULATORY_CARE_PROVIDER_SITE_OTHER): Payer: Medicare Other | Admitting: Obstetrics and Gynecology

## 2017-08-12 ENCOUNTER — Encounter: Payer: Self-pay | Admitting: Obstetrics and Gynecology

## 2017-08-12 ENCOUNTER — Other Ambulatory Visit: Payer: Self-pay

## 2017-08-12 VITALS — BP 124/70 | HR 88 | Resp 14 | Ht 65.0 in | Wt 150.0 lb

## 2017-08-12 DIAGNOSIS — N907 Vulvar cyst: Secondary | ICD-10-CM | POA: Diagnosis not present

## 2017-08-12 NOTE — Patient Instructions (Signed)
Epidermal Cyst An epidermal cyst is sometimes called an epidermal inclusion cyst or an infundibular cyst. It is a sac made of skin tissue. The sac contains a substance called keratin. Keratin is a protein that is normally secreted through the hair follicles. When keratin becomes trapped in the top layer of skin (epidermis), it can form an epidermal cyst. Epidermal cysts are usually found on the face, neck, trunk, and genitals. These cysts are usually harmless (benign), and they may not cause symptoms unless they become infected. It is important not to pop epidermal cysts yourself. What are the causes? This condition may be caused by:  A blocked hair follicle.  A hair that curls and re-enters the skin instead of growing straight out of the skin (ingrown hair).  A blocked pore.  Irritated skin.  An injury to the skin.  Certain conditions that are passed along from parent to child (inherited).  Human papillomavirus (HPV).  What increases the risk? The following factors may make you more likely to develop an epidermal cyst:  Having acne.  Being overweight.  Wearing tight clothing.  What are the signs or symptoms? The only symptom of this condition may be a small, painless lump underneath the skin. When an epidermal cyst becomes infected, symptoms may include:  Redness.  Inflammation.  Tenderness.  Warmth.  Fever.  Keratin draining from the cyst. Keratin may look like a grayish-white, bad-smelling substance.  Pus draining from the cyst.  How is this diagnosed? This condition is diagnosed with a physical exam. In some cases, you may have a sample of tissue (biopsy) taken from your cyst to be examined under a microscope or tested for bacteria. You may be referred to a health care provider who specializes in skin care (dermatologist). How is this treated? In many cases, epidermal cysts go away on their own without treatment. If a cyst becomes infected, treatment may  include:  Opening and draining the cyst. After draining, minor surgery to remove the rest of the cyst may be done.  Antibiotic medicine to help prevent infection.  Injections of medicines (steroids) that help to reduce inflammation.  Surgery to remove the cyst. Surgery may be done if: ? The cyst becomes large. ? The cyst bothers you. ? There is a chance that the cyst could turn into cancer.  Follow these instructions at home:  Take over-the-counter and prescription medicines only as told by your health care provider.  If you were prescribed an antibiotic, use it as told by your health care provider. Do not stop using the antibiotic even if you start to feel better.  Keep the area around your cyst clean and dry.  Wear loose, dry clothing.  Do not try to pop your cyst.  Avoid touching your cyst.  Check your cyst every day for signs of infection.  Keep all follow-up visits as told by your health care provider. This is important. How is this prevented?  Wear clean, dry, clothing.  Avoid wearing tight clothing.  Keep your skin clean and dry. Shower or take baths every day.  Wash your body with a benzoyl peroxide wash when you shower or bathe. Contact a health care provider if:  Your cyst develops symptoms of infection.  Your condition is not improving or is getting worse.  You develop a cyst that looks different from other cysts you have had.  You have a fever. Get help right away if:  Redness spreads from the cyst into the surrounding area. This information is   not intended to replace advice given to you by your health care provider. Make sure you discuss any questions you have with your health care provider. Document Released: 01/18/2004 Document Revised: 10/16/2015 Document Reviewed: 12/19/2014 Elsevier Interactive Patient Education  2018 [employer]  
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
## 2017-08-16 ENCOUNTER — Encounter: Payer: Self-pay | Admitting: Obstetrics and Gynecology

## 2017-08-16 DIAGNOSIS — F330 Major depressive disorder, recurrent, mild: Secondary | ICD-10-CM | POA: Diagnosis not present

## 2017-08-23 DIAGNOSIS — F330 Major depressive disorder, recurrent, mild: Secondary | ICD-10-CM | POA: Diagnosis not present

## 2017-08-24 DIAGNOSIS — D229 Melanocytic nevi, unspecified: Secondary | ICD-10-CM | POA: Diagnosis not present

## 2017-08-24 DIAGNOSIS — L821 Other seborrheic keratosis: Secondary | ICD-10-CM | POA: Diagnosis not present

## 2017-09-08 DIAGNOSIS — F330 Major depressive disorder, recurrent, mild: Secondary | ICD-10-CM | POA: Diagnosis not present

## 2017-09-10 DIAGNOSIS — D538 Other specified nutritional anemias: Secondary | ICD-10-CM | POA: Diagnosis not present

## 2017-09-10 DIAGNOSIS — M810 Age-related osteoporosis without current pathological fracture: Secondary | ICD-10-CM | POA: Diagnosis not present

## 2017-09-10 DIAGNOSIS — E7849 Other hyperlipidemia: Secondary | ICD-10-CM | POA: Diagnosis not present

## 2017-09-10 DIAGNOSIS — D539 Nutritional anemia, unspecified: Secondary | ICD-10-CM | POA: Diagnosis not present

## 2017-09-10 DIAGNOSIS — E038 Other specified hypothyroidism: Secondary | ICD-10-CM | POA: Diagnosis not present

## 2017-09-16 DIAGNOSIS — M797 Fibromyalgia: Secondary | ICD-10-CM | POA: Diagnosis not present

## 2017-09-16 DIAGNOSIS — M859 Disorder of bone density and structure, unspecified: Secondary | ICD-10-CM | POA: Diagnosis not present

## 2017-09-16 DIAGNOSIS — E038 Other specified hypothyroidism: Secondary | ICD-10-CM | POA: Diagnosis not present

## 2017-09-16 DIAGNOSIS — K635 Polyp of colon: Secondary | ICD-10-CM | POA: Diagnosis not present

## 2017-09-16 DIAGNOSIS — Z1389 Encounter for screening for other disorder: Secondary | ICD-10-CM | POA: Diagnosis not present

## 2017-09-16 DIAGNOSIS — M810 Age-related osteoporosis without current pathological fracture: Secondary | ICD-10-CM | POA: Diagnosis not present

## 2017-09-16 DIAGNOSIS — Z6825 Body mass index (BMI) 25.0-25.9, adult: Secondary | ICD-10-CM | POA: Diagnosis not present

## 2017-09-16 DIAGNOSIS — E7849 Other hyperlipidemia: Secondary | ICD-10-CM | POA: Diagnosis not present

## 2017-09-16 DIAGNOSIS — K219 Gastro-esophageal reflux disease without esophagitis: Secondary | ICD-10-CM | POA: Diagnosis not present

## 2017-09-16 DIAGNOSIS — F329 Major depressive disorder, single episode, unspecified: Secondary | ICD-10-CM | POA: Diagnosis not present

## 2017-09-16 DIAGNOSIS — Z0000 Encounter for general adult medical examination without abnormal findings: Secondary | ICD-10-CM | POA: Diagnosis not present

## 2017-09-16 DIAGNOSIS — G4733 Obstructive sleep apnea (adult) (pediatric): Secondary | ICD-10-CM | POA: Diagnosis not present

## 2017-09-21 DIAGNOSIS — Z1212 Encounter for screening for malignant neoplasm of rectum: Secondary | ICD-10-CM | POA: Diagnosis not present

## 2017-09-23 DIAGNOSIS — M7712 Lateral epicondylitis, left elbow: Secondary | ICD-10-CM | POA: Diagnosis not present

## 2017-09-23 DIAGNOSIS — M25522 Pain in left elbow: Secondary | ICD-10-CM | POA: Diagnosis not present

## 2017-09-23 DIAGNOSIS — M7702 Medial epicondylitis, left elbow: Secondary | ICD-10-CM | POA: Diagnosis not present

## 2017-09-24 DIAGNOSIS — Z86010 Personal history of colonic polyps: Secondary | ICD-10-CM | POA: Diagnosis not present

## 2017-09-24 DIAGNOSIS — K625 Hemorrhage of anus and rectum: Secondary | ICD-10-CM | POA: Diagnosis not present

## 2017-09-24 DIAGNOSIS — Z1211 Encounter for screening for malignant neoplasm of colon: Secondary | ICD-10-CM | POA: Diagnosis not present

## 2017-09-29 DIAGNOSIS — F330 Major depressive disorder, recurrent, mild: Secondary | ICD-10-CM | POA: Diagnosis not present

## 2017-10-06 DIAGNOSIS — F330 Major depressive disorder, recurrent, mild: Secondary | ICD-10-CM | POA: Diagnosis not present

## 2017-10-22 DIAGNOSIS — F330 Major depressive disorder, recurrent, mild: Secondary | ICD-10-CM | POA: Diagnosis not present

## 2017-10-25 DIAGNOSIS — F330 Major depressive disorder, recurrent, mild: Secondary | ICD-10-CM | POA: Diagnosis not present

## 2017-10-27 DIAGNOSIS — M7712 Lateral epicondylitis, left elbow: Secondary | ICD-10-CM | POA: Diagnosis not present

## 2017-10-27 DIAGNOSIS — M7702 Medial epicondylitis, left elbow: Secondary | ICD-10-CM | POA: Diagnosis not present

## 2017-10-27 DIAGNOSIS — M25522 Pain in left elbow: Secondary | ICD-10-CM | POA: Diagnosis not present

## 2017-10-28 DIAGNOSIS — F330 Major depressive disorder, recurrent, mild: Secondary | ICD-10-CM | POA: Diagnosis not present

## 2017-10-29 ENCOUNTER — Other Ambulatory Visit: Payer: Self-pay | Admitting: Rheumatology

## 2017-10-29 NOTE — Telephone Encounter (Signed)
Last visit: 05/26/17 Next visit: 11/30/17  Okay to refill per Dr. Estanislado Pandy

## 2017-11-03 DIAGNOSIS — F330 Major depressive disorder, recurrent, mild: Secondary | ICD-10-CM | POA: Diagnosis not present

## 2017-11-09 ENCOUNTER — Other Ambulatory Visit: Payer: Self-pay | Admitting: Dermatology

## 2017-11-09 DIAGNOSIS — B078 Other viral warts: Secondary | ICD-10-CM | POA: Diagnosis not present

## 2017-11-09 DIAGNOSIS — D485 Neoplasm of uncertain behavior of skin: Secondary | ICD-10-CM | POA: Diagnosis not present

## 2017-11-09 DIAGNOSIS — L820 Inflamed seborrheic keratosis: Secondary | ICD-10-CM | POA: Diagnosis not present

## 2017-11-10 DIAGNOSIS — M47816 Spondylosis without myelopathy or radiculopathy, lumbar region: Secondary | ICD-10-CM | POA: Diagnosis not present

## 2017-11-10 DIAGNOSIS — M5136 Other intervertebral disc degeneration, lumbar region: Secondary | ICD-10-CM | POA: Diagnosis not present

## 2017-11-10 DIAGNOSIS — M545 Low back pain: Secondary | ICD-10-CM | POA: Diagnosis not present

## 2017-11-15 DIAGNOSIS — M545 Low back pain: Secondary | ICD-10-CM | POA: Diagnosis not present

## 2017-11-15 DIAGNOSIS — M47816 Spondylosis without myelopathy or radiculopathy, lumbar region: Secondary | ICD-10-CM | POA: Diagnosis not present
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
## 2017-11-24 DIAGNOSIS — M7712 Lateral epicondylitis, left elbow: Secondary | ICD-10-CM | POA: Diagnosis not present

## 2017-11-24 DIAGNOSIS — M25522 Pain in left elbow: Secondary | ICD-10-CM | POA: Diagnosis not present

## 2017-11-24 DIAGNOSIS — F330 Major depressive disorder, recurrent, mild: Secondary | ICD-10-CM | POA: Diagnosis not present

## 2017-11-24 DIAGNOSIS — M7702 Medial epicondylitis, left elbow: Secondary | ICD-10-CM | POA: Diagnosis not present

## 2017-11-30 ENCOUNTER — Ambulatory Visit (INDEPENDENT_AMBULATORY_CARE_PROVIDER_SITE_OTHER): Payer: Medicare Other | Admitting: Rheumatology

## 2017-11-30 ENCOUNTER — Encounter: Payer: Self-pay | Admitting: Rheumatology

## 2017-11-30 VITALS — BP 109/74 | HR 71 | Resp 12 | Ht 65.0 in | Wt 151.6 lb

## 2017-11-30 DIAGNOSIS — M19042 Primary osteoarthritis, left hand: Secondary | ICD-10-CM

## 2017-11-30 DIAGNOSIS — Z96651 Presence of right artificial knee joint: Secondary | ICD-10-CM | POA: Diagnosis not present

## 2017-11-30 DIAGNOSIS — F5101 Primary insomnia: Secondary | ICD-10-CM

## 2017-11-30 DIAGNOSIS — Z8719 Personal history of other diseases of the digestive system: Secondary | ICD-10-CM | POA: Diagnosis not present

## 2017-11-30 DIAGNOSIS — M7712 Lateral epicondylitis, left elbow: Secondary | ICD-10-CM | POA: Diagnosis not present

## 2017-11-30 DIAGNOSIS — M19041 Primary osteoarthritis, right hand: Secondary | ICD-10-CM

## 2017-11-30 DIAGNOSIS — M810 Age-related osteoporosis without current pathological fracture: Secondary | ICD-10-CM | POA: Diagnosis not present

## 2017-11-30 DIAGNOSIS — M7502 Adhesive capsulitis of left shoulder: Secondary | ICD-10-CM | POA: Diagnosis not present

## 2017-11-30 DIAGNOSIS — Z8669 Personal history of other diseases of the nervous system and sense organs: Secondary | ICD-10-CM

## 2017-11-30 DIAGNOSIS — M1712 Unilateral primary osteoarthritis, left knee: Secondary | ICD-10-CM

## 2017-11-30 DIAGNOSIS — E559 Vitamin D deficiency, unspecified: Secondary | ICD-10-CM

## 2017-11-30 DIAGNOSIS — M797 Fibromyalgia: Secondary | ICD-10-CM | POA: Diagnosis not present

## 2017-11-30 DIAGNOSIS — Z8659 Personal history of other mental and behavioral disorders: Secondary | ICD-10-CM

## 2017-11-30 DIAGNOSIS — M5030 Other cervical disc degeneration, unspecified cervical region: Secondary | ICD-10-CM

## 2017-11-30 MED ORDER — DICLOFENAC SODIUM 1 % TD GEL: TRANSDERMAL | 3 refills | Status: DC

## 2017-12-01 ENCOUNTER — Ambulatory Visit (INDEPENDENT_AMBULATORY_CARE_PROVIDER_SITE_OTHER): Payer: Medicare Other | Admitting: Psychiatry

## 2017-12-01 ENCOUNTER — Encounter: Payer: Self-pay | Admitting: Psychiatry

## 2017-12-01 DIAGNOSIS — F330 Major depressive disorder, recurrent, mild: Secondary | ICD-10-CM

## 2017-12-01 NOTE — Patient Instructions (Signed)
Focus more on self-care as she is primary support for her husband who struggles with serious emotional and physical illnesses.

## 2017-12-01 NOTE — Progress Notes (Signed)
      [facility] Counselor/Therapist Progress Note   Patient ID: [name], MRN: [id number]  Date: 12/01/2017  Timespent: 45 minutes  Treatment Type: Individual  Subjective: Patient seen today for continued depression, anxiety, and stress.  Emotional and financial issues.  Patient is primary support and caregiver for husband who struggles with serious emotional and physical illnesses.  Interventions:Solution Focused, Strength-based, Supportive and Reframing  Mental Status Exam:   Appearance:   Neat     Behavior:  Motivated  Motor:  Normal  Speech/Language:   Normal Rate  Affect:  Depressed  Mood:  depressed  Thought process:  Intact  Thought content:    Logical and Ilusions  Perceptual disturbances:    Normal  Orientation:  Full (Time, Place, and Person)  Attention:  Good  Concentration:  good  Memory:  Immediate  Fund of knowledge:   Good  Insight:    Good  Judgment:   Good  Impulse Control:  good    Reported Symptoms: Depression, stress, frustration, anxiety, difficulty remembering self-care.  Risk Assessment: Danger to Self:  No Self-injurious Behavior: No Danger to Others: No Duty to Warn:no Physical Aggression / Violence:No  Access to Firearms a concern: No  Gang Involvement:No   Diagnosis:   ICD-10-CM   1. Major depressive disorder, recurrent episode, mild ([place]) F33.0      Plan: Follow patient in individual psychotherapy every 1-2 weeks.  Focusing on her own self-care and emotional needs while being primary support and caregiver for husband.  Shanon Ace, LCSW

## 2017-12-02 ENCOUNTER — Telehealth: Payer: Self-pay | Admitting: *Deleted

## 2017-12-02 NOTE — Telephone Encounter (Signed)
Prior authorization approved. Referral number [id number]. Approved until 03/01/18.  Patient advised.

## 2017-12-02 NOTE — Telephone Encounter (Signed)
Prior Authorization for Voltaren Gel submitted via cover my meds. Will updated once response received.

## 2017-12-03 DIAGNOSIS — M25522 Pain in left elbow: Secondary | ICD-10-CM | POA: Diagnosis not present

## 2017-12-04 DIAGNOSIS — Z23 Encounter for immunization: Secondary | ICD-10-CM | POA: Diagnosis not present

## 2017-12-06 DIAGNOSIS — F330 Major depressive disorder, recurrent, mild: Secondary | ICD-10-CM

## 2017-12-07 ENCOUNTER — Ambulatory Visit (INDEPENDENT_AMBULATORY_CARE_PROVIDER_SITE_OTHER): Payer: Medicare Other | Admitting: Psychiatry

## 2017-12-07 DIAGNOSIS — Z79899 Other long term (current) drug therapy: Secondary | ICD-10-CM

## 2017-12-07 DIAGNOSIS — F331 Major depressive disorder, recurrent, moderate: Secondary | ICD-10-CM | POA: Diagnosis not present

## 2017-12-07 NOTE — Progress Notes (Signed)
      [facility] Counselor/Therapist Progress Note   Patient ID: [name], MRN: [id number]  Date: 12/07/2017  Timespent: 60 minutes  Treatment Type: Individual  Subjective: Patient seen today and reports depressed mood with the contributing factors being mostly her husband's physical and emotional illnesses and a financial crisis they are currently facing. Appearing on the edge of tears several times throughout session.  Coping strategies were discussed and self-care was reviewed with patient, as we also acknowledged her strengths. Encouraged to use some of her support network as needed.    Interventions:Strength-based, Supportive and Family Systems  Mental Status Exam:   Appearance:   Neat     Behavior:  Appropriate and Sharing  Motor:  Normal  Speech/Language:   Normal Rate  Affect:  Congruent  Mood:  anxious, depressed and irritable  Thought process:  Coherent  Thought content:    Logical  Perceptual disturbances:    Normal  Orientation:  Full (Time, Place, and Person)  Attention:  Good  Concentration:  good  Memory:  Immediate  Fund of knowledge:   Good  Insight:    Good  Judgment:   Good  Impulse Control:  good    Reported Symptoms: depressed mood   Risk Assessment: Danger to Self:  No Self-injurious Behavior: No Danger to Others: No Duty to Warn:no Physical Aggression / Violence:No  Access to Firearms a concern: No  Gang Involvement:No   Diagnosis:   ICD-10-CM   1. Major depressive disorder, recurrent episode, moderate ([facility]) F33.1      Plan: Will see patient within 2 wks to continue goal-directed treatment.  Shanon Ace, LCSW

## 2017-12-08 ENCOUNTER — Ambulatory Visit: Payer: Medicare Other | Admitting: Psychiatry

## 2017-12-09 DIAGNOSIS — M7712 Lateral epicondylitis, left elbow: Secondary | ICD-10-CM | POA: Diagnosis not present

## 2017-12-15 ENCOUNTER — Ambulatory Visit (INDEPENDENT_AMBULATORY_CARE_PROVIDER_SITE_OTHER): Payer: Medicare Other | Admitting: Psychiatry

## 2017-12-15 DIAGNOSIS — F331 Major depressive disorder, recurrent, moderate: Secondary | ICD-10-CM

## 2017-12-15 NOTE — Progress Notes (Signed)
      [facility] Counselor/Therapist Progress Note   Patient ID: [name], MRN: [id number]  Date: 12/15/2017  Timespent: 45 minutes  Treatment Type: Individual  Subjective: Patient in today and obviously more sad and with depressed/flat affect.  Husband was just recently denied Disability benefits which was very disappointing to patient and husband.  Talked through her concerns and sadness over husband being denied.  They are considering re-applying but trying to gather more info now.  Also looking for more activities to be involved in that are low in cost.  Emphasized self-care strategies to patient and also to remain involved with friends and their church which is very supportive of her and her husband.    Interventions:Solution Focused and Supportive  Mental Status Exam:   Appearance:   Casual     Behavior:  Appropriate and Sharing  Motor:  Normal  Speech/Language:   Normal Rate  Affect:  Appropriate, Depressed and Flat  Mood:  depressed  Thought process:  Coherent  Thought content:    Logical  Perceptual disturbances:    Normal  Orientation:  Full (Time, Place, and Person)  Attention:  Good  Concentration:  good  Memory:  Immediate  Fund of knowledge:   Good  Insight:    Good  Judgment:   Good  Impulse Control:  good    Reported Symptoms: depressed mood, sad, discouraged, fatigue, stressed  Risk Assessment: Danger to Self:  No Self-injurious Behavior: No Danger to Others: No Duty to Warn:no Physical Aggression / Violence:No  Access to Firearms a concern: No  Gang Involvement:No   Diagnosis:   ICD-10-CM   1. Major depressive disorder, recurrent episode, moderate (HCC) F33.1      Plan:  Will see patient in 1 week.  Shanon Ace, LCSW

## 2017-12-17 ENCOUNTER — Encounter: Payer: Self-pay | Admitting: Psychiatry

## 2017-12-17 ENCOUNTER — Ambulatory Visit (INDEPENDENT_AMBULATORY_CARE_PROVIDER_SITE_OTHER): Payer: Medicare Other | Admitting: Psychiatry

## 2017-12-17 VITALS — BP 101/70 | HR 85

## 2017-12-17 DIAGNOSIS — F331 Major depressive disorder, recurrent, moderate: Secondary | ICD-10-CM | POA: Diagnosis not present

## 2017-12-17 DIAGNOSIS — F5101 Primary insomnia: Secondary | ICD-10-CM | POA: Diagnosis not present

## 2017-12-17 DIAGNOSIS — F411 Generalized anxiety disorder: Secondary | ICD-10-CM

## 2017-12-17 MED ORDER — BUPROPION HCL ER (XL) 150 MG PO TB24: 150.0000 mg | ORAL_TABLET | Freq: Every day | ORAL | 1 refills | Status: DC
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
## 2017-12-22 ENCOUNTER — Ambulatory Visit (INDEPENDENT_AMBULATORY_CARE_PROVIDER_SITE_OTHER): Payer: Medicare Other | Admitting: Psychiatry

## 2017-12-22 DIAGNOSIS — F331 Major depressive disorder, recurrent, moderate: Secondary | ICD-10-CM | POA: Diagnosis not present

## 2017-12-22 NOTE — Progress Notes (Signed)
      [facility] Counselor/Therapist Progress Note   Patient ID: [name], MRN: [id number]  Date: 12/22/2017  Timespent: 58 minutes  Treatment Type: Individual  Subjective: Patient in today with symptoms of anxiety and some depressed mood.  Ups and downs with husband's mental and physical health.  More downs more recently and seem to be a result of current stressors.  Financial stress especially since husband's disability application denial.  Patient is following up with additional resources and considering whether to re-file.  Did reach out to [employer] to get more info and some direction. Patient reports "I really work hard trying to stay up and motivated", it's hard when husband struggles with memory or other functioning problems.  Husband having personal issues recently that are embarrassing for him.  Encouraged patient to consider having him seen by a physician specialist (gastorenterologist). Pt self-conscious about financial issues that is out of her control.  Processed at length her concerns and support provided.  Goal review and progress noted.    Interventions:Solution Focused and Supportive  Mental Status Exam:   Appearance:   Neat     Behavior:  Appropriate and Sharing  Motor:  Normal  Speech/Language:   Normal Rate  Affect:  Congruent  Mood:  anxious  Thought process:  Relevant  Thought content:    Logical  Perceptual disturbances:    Normal  Orientation:  Full (Time, Place, and Person)  Attention:  Good  Concentration:  good  Memory:  Immediate  Fund of knowledge:   Good  Insight:    Good  Judgment:   Good  Impulse Control:  good    Reported Symptoms: anxious, depression,  Risk Assessment: Danger to Self:  No Self-injurious Behavior: No Danger to Others: No Duty to Warn:no Physical Aggression / Violence:No  Access to Firearms a concern: No  Gang Involvement:No   Diagnosis:   ICD-10-CM   1. Major depressive disorder, recurrent  episode, moderate (HCC) F33.1      Plan: Will see patient next week to continue goal-directed treatment.  Shanon Ace, LCSW

## 2017-12-29 ENCOUNTER — Ambulatory Visit (INDEPENDENT_AMBULATORY_CARE_PROVIDER_SITE_OTHER): Payer: Medicare Other | Admitting: Psychiatry

## 2017-12-29 DIAGNOSIS — F331 Major depressive disorder, recurrent, moderate: Secondary | ICD-10-CM | POA: Diagnosis not present

## 2017-12-29 NOTE — Progress Notes (Signed)
      [facility] Counselor/Therapist Progress Note   Patient ID: [name], MRN: [id number]  Date: 12/29/2017  Timespent: 58 minutes   Treatment Type: Individual   Reported Symptoms: Fatigue and frustration, fears, depressed mood, anxiety,   Mental Status Exam:    Appearance:   Casual     Behavior:  Appropriate and Sharing  Motor:  Normal  Speech/Language:   Normal Rate  Affect:  Depressed and Tearful  Mood:  anxious, depressed and sad  Thought process:  normal  Thought content:    WNL  Sensory/Perceptual disturbances:    WNL  Orientation:  oriented to person, place, time, situation, date, year  Attention:  Good  Concentration:  Good  Memory:  WNL  Fund of knowledge:   Good  Insight:    Good  Judgment:   Good  Impulse Control:  Good     Risk Assessment: Danger to Self:  No Self-injurious Behavior: No Danger to Others: No Duty to Warn:no Physical Aggression / Violence:No  Access to Firearms a concern: No  Gang Involvement:No    Subjective:   Patient in today feeling stressed, anxious, sad, depressed, and fatigue.  Very upset over their current personal distress that she wishes to remain private.  Tearfully talked through a lot of her concerns and did seem to feel more encouraged by end of session. ;Seems calmer and has a plan to help her get through this coming week, asking for what she needs and following up with family member.   Interventions: Solution-Oriented/Positive Psychology, Ego-Supportive and Insight-Oriented   Diagnosis:   ICD-10-CM   1. Major depressive disorder, recurrent episode, moderate (HCC) F33.1      Plan:   To see patient again in approx 1 week for continued goal-directed treatment.   Shanon Ace, LCSW

## 2017-12-31 DIAGNOSIS — M7712 Lateral epicondylitis, left elbow: Secondary | ICD-10-CM | POA: Diagnosis not present

## 2018-01-05 ENCOUNTER — Ambulatory Visit (INDEPENDENT_AMBULATORY_CARE_PROVIDER_SITE_OTHER): Payer: Medicare Other | Admitting: Psychiatry

## 2018-01-05 DIAGNOSIS — F331 Major depressive disorder, recurrent, moderate: Secondary | ICD-10-CM

## 2018-01-05 NOTE — Progress Notes (Signed)
      [facility] Counselor/Therapist Progress Note   Patient ID: KAMERAN LALLIER, MRN: [id number]  Date: 01/05/2018  Timespent: 60 minutes   Treatment Type: Individual   Reported Symptoms: sadness, depression, anxiety   Mental Status Exam:    Appearance:   Casual     Behavior:  Appropriate, Sharing and tearful  Motor:  Normal  Speech/Language:   Normal Rate  Affect:  Depressed and Tearful  Mood:  anxious, depressed and sad  Thought process:  normal  Thought content:    WNL  Sensory/Perceptual disturbances:    WNL  Orientation:  oriented to person, place, time/date, situation, day of week, month of year and year  Attention:  Good  Concentration:  Good  Memory:  WNL  Fund of knowledge:   Good  Insight:    Good  Judgment:   Good  Impulse Control:  Good     Risk Assessment: Danger to Self:  No Self-injurious Behavior: No Danger to Others: No Duty to Warn:no Physical Aggression / Violence:No  Access to Firearms a concern: No  Gang Involvement:No    Subjective: Patient in today with symptoms of sadness, hurt, depression, and anxiety.  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   Fears of the unknown and of the future.  Blaming self and feeling badly---seemed to be assigning less blame to herself by end of session.  Processed lots of feelings and emotions, as well as looked at who their support system is and realize this is a time where they need to be open to receive support of others. Encouraged self-care (healthy eating, good sleep, exercise, contact with family/friends).  Return next week.  Interventions: Solution-Oriented/Positive Psychology and Ego-Supportive   Diagnosis:   ICD-10-CM   1. Major depressive disorder, recurrent episode, moderate ([place]) F33.1      Plan: Plan to see patient next week to continue supportive and goal-directed treatment.   Shanon Ace,  LCSW

## 2018-01-07 DIAGNOSIS — M25522 Pain in left elbow: Secondary | ICD-10-CM | POA: Diagnosis not present

## 2018-01-09 ENCOUNTER — Encounter: Payer: Self-pay | Admitting: Emergency Medicine

## 2018-01-09 DIAGNOSIS — F3342 Major depressive disorder, recurrent, in full remission: Secondary | ICD-10-CM | POA: Insufficient documentation

## 2018-01-09 DIAGNOSIS — F419 Anxiety disorder, unspecified: Secondary | ICD-10-CM | POA: Insufficient documentation

## 2018-01-10 ENCOUNTER — Ambulatory Visit (INDEPENDENT_AMBULATORY_CARE_PROVIDER_SITE_OTHER): Payer: Medicare Other | Admitting: Psychiatry

## 2018-01-10 DIAGNOSIS — F331 Major depressive disorder, recurrent, moderate: Secondary | ICD-10-CM

## 2018-01-10 NOTE — Progress Notes (Signed)
      [facility] Counselor/Therapist Progress Note   Patient ID: [name], MRN: [id number]  Date: 01/10/2018  Timespent: 57 minutes    Treatment Type: Individual   Reported Symptoms: Fatigue and anxiety and depression   Mental Status Exam:    Appearance:   Casual     Behavior:  Appropriate and Sharing  Motor:  Normal  Speech/Language:   Normal Rate  Affect:  Depressed  Mood:  anxious, depressed and sad  Thought process:  normal  Thought content:    WNL  Sensory/Perceptual disturbances:    WNL  Orientation:  oriented to person, place, time/date, situation, day of week, month of year and year  Attention:  Good  Concentration:  Good  Memory:  WNL  Fund of knowledge:   Good  Insight:    Good  Judgment:   Good  Impulse Control:  Good     Risk Assessment: Danger to Self:  No Self-injurious Behavior: No Danger to Others: No Duty to Warn:no Physical Aggression / Violence:No  Access to Firearms a concern: No  Gang Involvement:No    Subjective:  Patient stressed today with personal legal issues right now.  Embarassed, fearful, anxious, and depressed. Has some really good friends that are being supportive of patient and husband.  Processed her sadness and grief about all they are going through right now.  Encouraged her to stay in contact with her support system of friends and continuing to work in therapy where she is progressing.   Interventions: Solution-Oriented/Positive Psychology and Ego-Supportive   Diagnosis:   ICD-10-CM   1. Major depressive disorder, recurrent episode, moderate ([place]) F33.1      Plan: Plan to see patient in 1-2 weeks to continue goal-directed treatment.   Shanon Ace, LCSW

## 2018-01-14 DIAGNOSIS — M25522 Pain in left elbow: Secondary | ICD-10-CM | POA: Diagnosis not present

## 2018-01-17 ENCOUNTER — Ambulatory Visit: Payer: Medicare Other | Admitting: Psychiatry

## 2018-01-19 ENCOUNTER — Ambulatory Visit (INDEPENDENT_AMBULATORY_CARE_PROVIDER_SITE_OTHER): Payer: Medicare Other | Admitting: Psychiatry

## 2018-01-19 DIAGNOSIS — F331 Major depressive disorder, recurrent, moderate: Secondary | ICD-10-CM | POA: Diagnosis not present

## 2018-01-19 NOTE — Progress Notes (Signed)
      [facility] Counselor/Therapist Progress Note   Patient ID: [name], MRN: [id number]  Date: 01/19/2018  Timespent:  58 minutes  Treatment Type: Individual   Reported Symptoms: depression, anxiety, sadness, fear,    Mental Status Exam:    Appearance:   Neat     Behavior:  Appropriate and Sharing  Motor:  Normal  Speech/Language:   Normal Rate  Affect:  Depressed  Mood:  depressed  Thought process:  normal  Thought content:    WNL  Sensory/Perceptual disturbances:    WNL  Orientation:  oriented to person, place, time/date, situation, day of week, month of year and year  Attention:  Good  Concentration:  Good  Memory:  WNL  Fund of knowledge:   Good  Insight:    Good  Judgment:   Good  Impulse Control:  Good     Risk Assessment: Danger to Self:  No Self-injurious Behavior: No Danger to Others: No Duty to Warn:no Physical Aggression / Violence:No  Access to Firearms a concern: No  Gang Involvement:No    Subjective: Patient in today needing to process her feelings of anxiety, fear, and concern re: their personal financial issues going on right now.  Came in tearful and quite depressed and anxious.  Has been focusing on her fear and imaging only bad things happening.  Worked with her to develop strategies to have an outlook that includes staying in the present and be looking out for some positives to happen.  Tendency to dwell on the negative and worries "all the time" .  Focused on some of the good things that have transpired for her and husband more recently thank to their friends.  Did seem a bit less stressed upon leaving at end of session.   Interventions: Cognitive Behavioral Therapy, Ego-Supportive and Grief Therapy   Diagnosis:   ICD-10-CM   1. Major depressive disorder, recurrent episode, moderate ([place]) F33.1      Plan: Choose to use some strategies discussed in session that can help her feel more stable and manage current anxiety and fear.   Realizes she is awful-izing and looking into the future expecting things to go badly.  Understandable fears but also helped patient not to make negative assumptions based on her fears.   Shanon Ace, LCSW

## 2018-01-20 DIAGNOSIS — M25522 Pain in left elbow: Secondary | ICD-10-CM | POA: Diagnosis not present

## 2018-01-31 ENCOUNTER — Ambulatory Visit (INDEPENDENT_AMBULATORY_CARE_PROVIDER_SITE_OTHER): Payer: Medicare Other | Admitting: Psychiatry

## 2018-01-31 ENCOUNTER — Encounter: Payer: Self-pay | Admitting: Psychiatry

## 2018-01-31 VITALS — BP 97/60 | HR 80

## 2018-01-31 DIAGNOSIS — F411 Generalized anxiety disorder: Secondary | ICD-10-CM

## 2018-01-31 DIAGNOSIS — F5101 Primary insomnia: Secondary | ICD-10-CM

## 2018-01-31 DIAGNOSIS — F330 Major depressive disorder, recurrent, mild: Secondary | ICD-10-CM | POA: Diagnosis not present

## 2018-01-31 MED ORDER — BUSPIRONE HCL 15 MG PO TABS: ORAL_TABLET | ORAL | 1 refills | Status: DC

## 2018-01-31 MED ORDER — DULOXETINE HCL 60 MG PO CPEP
60.0000 mg | ORAL_CAPSULE | ORAL | 1 refills | Status: DC
Start: 2018-01-31 — End: 2018-03-14

## 2018-01-31 MED ORDER — METFORMIN HCL 500 MG PO TABS: 500.0000 mg | ORAL_TABLET | Freq: Two times a day (BID) | ORAL | 1 refills | Status: DC
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
## 2018-02-01 DIAGNOSIS — M25522 Pain in left elbow: Secondary | ICD-10-CM | POA: Diagnosis not present

## 2018-02-02 ENCOUNTER — Ambulatory Visit (INDEPENDENT_AMBULATORY_CARE_PROVIDER_SITE_OTHER): Payer: Medicare Other | Admitting: Psychiatry

## 2018-02-02 DIAGNOSIS — F331 Major depressive disorder, recurrent, moderate: Secondary | ICD-10-CM

## 2018-02-02 NOTE — Progress Notes (Signed)
      [facility] Counselor/Therapist Progress Note  Patient ID: [name], MRN: [id number],    Date: 02/02/2018  Time Spent:  58 minutes  Treatment Type: Individual Therapy  Reported Symptoms: Depressed mood, Anxious Mood, Irritability, Fatigue and Isolation and withdrawal  Mental Status Exam:  Appearance:   Casual     Behavior:  Appropriate and Sharing  Motor:  Normal  Speech/Language:   Normal Rate  Affect:  Congruent  Mood:  anxious and depressed  Thought process:  normal  Thought content:    WNL  Sensory/Perceptual disturbances:    WNL  Orientation:  oriented to person, place, time/date, situation, day of week, month of year and year  Attention:  Good  Concentration:  Good  Memory:  WNL  Fund of knowledge:   Good  Insight:    Good  Judgment:   Good  Impulse Control:  Good   Risk Assessment: Danger to Self:  No Self-injurious Behavior: No Danger to Others: No Duty to Warn:no Physical Aggression / Violence:No  Access to Firearms a concern: No  Gang Involvement:No   Subjective:  Patient in today experiencing anxiety and depression however is better.  Took good trip to [place] to see friends over Thanksgiving which was uplifting.  Has started Buspar to help the anxiety this past week.  Encouraged to use some of the CBT we used today in doing some re-framing and really looking at how her thoughts really impact her feelings, and ultimately her behavior which she found helpful.    Interventions: Cognitive Behavioral Therapy and Ego-Supportive  Diagnosis:   ICD-10-CM   1. Major depressive disorder, recurrent episode, moderate ([place]) F33.1     Plan:  Patient to practice with the CBT exercises and also encouraged good self-care, in addition to getting out of house often.  Shanon Ace, LCSW

## 2018-02-09 ENCOUNTER — Ambulatory Visit (INDEPENDENT_AMBULATORY_CARE_PROVIDER_SITE_OTHER): Payer: Medicare Other | Admitting: Psychiatry

## 2018-02-09 ENCOUNTER — Other Ambulatory Visit: Payer: Self-pay | Admitting: Physician Assistant

## 2018-02-09 DIAGNOSIS — F331 Major depressive disorder, recurrent, moderate: Secondary | ICD-10-CM | POA: Diagnosis not present

## 2018-02-09 NOTE — Progress Notes (Signed)
      [facility] Counselor/Therapist Progress Note  Patient ID: [name], MRN: [id number],    Date: 02/09/2018  Time Spent:  58 minutes  Treatment Type: Individual Therapy  Reported Symptoms:  Depression, anxiety (lessened)  Mental Status Exam:  Appearance:   Neat     Behavior:  Appropriate and Sharing  Motor:  Normal  Speech/Language:   Clear and Coherent  Affect:  Depressed and Tearful  Mood:  anxious and depressed  Thought process:  normal  Thought content:    WNL  Sensory/Perceptual disturbances:    WNL  Orientation:  oriented to person, place, time/date, situation, day of week, month of year and year  Attention:  Good  Concentration:  Good  Memory:  WNL  Fund of knowledge:   Good  Insight:    Good  Judgment:   Good  Impulse Control:  Good   Risk Assessment: Danger to Self:  No Self-injurious Behavior: No Danger to Others: No Duty to Warn:no Physical Aggression / Violence:No  Access to Firearms a concern: No  Gang Involvement:No   Subjective:  Patient in today with anxiety and depression.  Depression has lessened some. Some guilt when she speaks of her disappointments and concerns with what all has happened with husband's health, physically and emotionally.  Multiple grief issues for patient are much more sensitive for patient during holiday times.  Patient processed some of this during today's session and felt encouraged at the end.  Interventions: Cognitive Behavioral Therapy and Ego-Supportive  Diagnosis:   ICD-10-CM   1. Major depressive disorder, recurrent episode, moderate ([place]) F33.1     Plan:  Patient to focus on allowing herself to have her moments of grief rather than trying to hold them back.  Also to be in touch with friends that are very supportive of her and her husband as their contacts are helpful for patient.  Shanon Ace, LCSW

## 2018-02-09 NOTE — Telephone Encounter (Signed)
Last Visit: 11/30/17 Next Visit: 06/01/18  Okay to refill per Dr. Estanislado Pandy

## 2018-02-09 NOTE — Telephone Encounter (Signed)
ok 

## 2018-02-11 DIAGNOSIS — K219 Gastro-esophageal reflux disease without esophagitis: Secondary | ICD-10-CM | POA: Diagnosis not present

## 2018-02-11 DIAGNOSIS — M1990 Unspecified osteoarthritis, unspecified site: Secondary | ICD-10-CM | POA: Diagnosis not present

## 2018-02-11 DIAGNOSIS — F329 Major depressive disorder, single episode, unspecified: Secondary | ICD-10-CM | POA: Diagnosis not present

## 2018-02-11 DIAGNOSIS — Z6823 Body mass index (BMI) 23.0-23.9, adult: Secondary | ICD-10-CM | POA: Diagnosis not present

## 2018-02-11 DIAGNOSIS — E038 Other specified hypothyroidism: Secondary | ICD-10-CM | POA: Diagnosis not present

## 2018-02-11 DIAGNOSIS — E7849 Other hyperlipidemia: Secondary | ICD-10-CM | POA: Diagnosis not present

## 2018-02-16 ENCOUNTER — Ambulatory Visit (INDEPENDENT_AMBULATORY_CARE_PROVIDER_SITE_OTHER): Payer: Medicare Other | Admitting: Psychiatry

## 2018-02-16 DIAGNOSIS — F331 Major depressive disorder, recurrent, moderate: Secondary | ICD-10-CM | POA: Diagnosis not present

## 2018-02-16 NOTE — Progress Notes (Signed)
      [facility] Counselor/Therapist Progress Note  Patient ID: CERIAH Fischer, MRN: [id number],    Date: 02/16/2018  Time Spent: 60 minutes  Treatment Type: Individual Therapy  Reported Symptoms:  Depression, anxiety, "up and down but not extreme",   Mental Status Exam:  Appearance:   Casual     Behavior:  Appropriate and Sharing  Motor:  Normal  Speech/Language:   Normal Rate  Affect:  Congruent  Mood:  anxious and depressed  Thought process:  normal  Thought content:    WNL  Sensory/Perceptual disturbances:    WNL  Orientation:  oriented to person, place, time/date, situation, day of week, month of year and year  Attention:  Good  Concentration:  Good  Memory:  WNL  Fund of knowledge:   Good  Insight:    Good  Judgment:   Good  Impulse Control:  Good   Risk Assessment: Danger to Self:  No Self-injurious Behavior: No Danger to Others: No Duty to Warn:no Physical Aggression / Violence:No  Access to Firearms a concern: No  Gang Involvement:No   Subjective: Patient in today with symptoms of anxiety and depression, with occasional ups and downs.  Appreciates supportive friends.  Not as tearful as last week.  Still dealing with sadness but feeling a little more confident to manage current stressors.  Processes her feelings re: her husband's health and her fears of what may happen in the future.  Is trying ot be careful to not jump ahead and to stay in the present.  Working to find out ways to take care of her self in midst of being primary caretaker for husband.  Good self-care strategies discussed and patient encouraged to use them on regular basis.  Interventions: Cognitive Behavioral Therapy and Ego-Supportive  Diagnosis:   ICD-10-CM   1. Major depressive disorder, recurrent episode, moderate (HCC) F33.1     Plan:  Patient to focus on self-care being a regular part of her daily life. Also to accept support from friends without feeling she is a problem for them.  Stay  on her meds as prescribed.  Continue some of the activities she really enjoys such as small groups at church, book studies, and audiobooks.  To return in 1-2 wks.  Shanon Ace, LCSW

## 2018-02-18 DIAGNOSIS — M25522 Pain in left elbow: Secondary | ICD-10-CM | POA: Diagnosis not present

## 2018-02-21 DIAGNOSIS — M25522 Pain in left elbow: Secondary | ICD-10-CM | POA: Diagnosis not present

## 2018-02-28 DIAGNOSIS — M25522 Pain in left elbow: Secondary | ICD-10-CM | POA: Diagnosis not present

## 2018-03-01 ENCOUNTER — Ambulatory Visit (INDEPENDENT_AMBULATORY_CARE_PROVIDER_SITE_OTHER): Payer: Medicare Other | Admitting: Psychiatry

## 2018-03-01 DIAGNOSIS — F331 Major depressive disorder, recurrent, moderate: Secondary | ICD-10-CM | POA: Diagnosis not present

## 2018-03-01 DIAGNOSIS — E7849 Other hyperlipidemia: Secondary | ICD-10-CM | POA: Diagnosis not present

## 2018-03-01 DIAGNOSIS — E038 Other specified hypothyroidism: Secondary | ICD-10-CM | POA: Diagnosis not present

## 2018-03-01 NOTE — Progress Notes (Signed)
      [facility] Counselor/Therapist Progress Note  Patient ID: [name], MRN: [id number],    Date: 03/01/2018  Time Spent: 60 minutes  Treatment Type: Individual Therapy  Reported Symptoms:   Sadness, anxiety, depressed  Mental Status Exam:  Appearance:   Casual     Behavior:  Appropriate and Sharing  Motor:  Normal  Speech/Language:   Normal Rate  Affect:  Congruent  Mood:  anxious and depressed  Thought process:  normal  Thought content:    WNL  Sensory/Perceptual disturbances:    WNL  Orientation:  oriented to person, place, time/date, situation, day of week, month of year and year  Attention:  Good  Concentration:  Good  Memory:  WNL  Fund of knowledge:   Good  Insight:    Good  Judgment:   Good  Impulse Control:  Good   Risk Assessment: Danger to Self:  No Self-injurious Behavior: No Danger to Others: No Duty to Warn:no Physical Aggression / Violence:No  Access to Firearms a concern: No  Gang Involvement:No   Subjective:   Patient in today with symptoms noted above.  Has had some increased depressed mood around holiday time, and is some better now a few days beyond Christmas.  Has plans with friends from church to go out for dinner New Year's day.  Patient shared some "down" times she is having sometimes mostly about the losses she and husband are experiencing---big losses financially, physically, and other losses in relationship etc.  Pet dog "Vira Agar" is "therapeutic for her as well as good friends.  Patient" feels big responsibility to be on top of everything and make sure all is ok".  Homework assignment given re: losses that keep coming up in her thoughts and sadden her a lot.  To return within 2 wks.  Interventions: Cognitive Behavioral Therapy and Ego-Supportive  Diagnosis:   ICD-10-CM   1. Major depressive disorder, recurrent episode, moderate (HCC) F33.1     Plan: To follow through on homework re: losses and focus also on increased self-care  (physically, mentally, and emotionally).   Will see within 3 wks.  Shanon Ace, LCSW

## 2018-03-03 DIAGNOSIS — M5136 Other intervertebral disc degeneration, lumbar region: Secondary | ICD-10-CM | POA: Diagnosis not present

## 2018-03-03 DIAGNOSIS — E039 Hypothyroidism, unspecified: Secondary | ICD-10-CM | POA: Diagnosis not present

## 2018-03-03 DIAGNOSIS — M542 Cervicalgia: Secondary | ICD-10-CM | POA: Diagnosis not present

## 2018-03-03 DIAGNOSIS — M545 Low back pain: Secondary | ICD-10-CM | POA: Diagnosis not present

## 2018-03-03 DIAGNOSIS — M47816 Spondylosis without myelopathy or radiculopathy, lumbar region: Secondary | ICD-10-CM | POA: Diagnosis not present

## 2018-03-03 DIAGNOSIS — G8929 Other chronic pain: Secondary | ICD-10-CM | POA: Diagnosis not present

## 2018-03-03 DIAGNOSIS — M5030 Other cervical disc degeneration, unspecified cervical region: Secondary | ICD-10-CM | POA: Diagnosis not present

## 2018-03-03 DIAGNOSIS — E875 Hyperkalemia: Secondary | ICD-10-CM | POA: Diagnosis not present

## 2018-03-03 DIAGNOSIS — M47812 Spondylosis without myelopathy or radiculopathy, cervical region: Secondary | ICD-10-CM | POA: Diagnosis not present

## 2018-03-04 DIAGNOSIS — M25522 Pain in left elbow: Secondary | ICD-10-CM | POA: Diagnosis not present

## 2018-03-05 ENCOUNTER — Other Ambulatory Visit: Payer: Self-pay | Admitting: Psychiatry

## 2018-03-05 DIAGNOSIS — F331 Major depressive disorder, recurrent, moderate: Secondary | ICD-10-CM

## 2018-03-08 ENCOUNTER — Ambulatory Visit (INDEPENDENT_AMBULATORY_CARE_PROVIDER_SITE_OTHER): Payer: Medicare Other | Admitting: Psychiatry

## 2018-03-08 DIAGNOSIS — F331 Major depressive disorder, recurrent, moderate: Secondary | ICD-10-CM | POA: Diagnosis not present

## 2018-03-08 DIAGNOSIS — M25522 Pain in left elbow: Secondary | ICD-10-CM | POA: Diagnosis not present

## 2018-03-08 NOTE — Progress Notes (Signed)
      [facility] Counselor/Therapist Progress Note  Patient ID: [name], MRN: [id number],    Date: 03/08/2018  Time Spent: 60 minutes   Treatment Type: Individual Therapy  Reported Symptoms:   Sadness, depression, anxiety  Mental Status Exam:  Appearance:   Casual     Behavior:  Appropriate and Sharing  Motor:  Normal  Speech/Language:   Normal Rate  Affect:  Congruent  Mood:  anxious and depressed  Thought process:  normal  Thought content:    WNL  Sensory/Perceptual disturbances:    WNL  Orientation:  oriented to person, place, time/date, situation, day of week, month of year and year  Attention:  Good  Concentration:  Good  Memory:  WNL  Fund of knowledge:   Good  Insight:    Good  Judgment:   Good  Impulse Control:  Good   Risk Assessment: Danger to Self:  No Self-injurious Behavior: No Danger to Others: No Duty to Warn:no Physical Aggression / Violence:No  Access to Firearms a concern: No  Gang Involvement:No   Subjective: Patient in today reporting depression, recent sadness, and anxiety.  Husband, with multiple health concerns including cognitive issues, is increasingly stressful for patient. Is getting info on facilities that provide respite care for patients to give relief for caregivers.  The demands of caregiving for her husband have increased some. Encouraged good self-care again for patient (emotional, physical, socially, nutritionally).  Interventions: Cognitive Behavioral Therapy and Solution-Oriented/Positive Psychology  Diagnosis:   ICD-10-CM   1. Major depressive disorder, recurrent episode, moderate (HCC) F33.1     Plan: Patient to consider "respite assistance" through [place], which would help patient in her self-care goals.  Also to practice good self-care per discussion in session.  Shanon Ace, LCSW

## 2018-03-14 ENCOUNTER — Encounter: Payer: Self-pay | Admitting: Psychiatry

## 2018-03-14 ENCOUNTER — Ambulatory Visit (INDEPENDENT_AMBULATORY_CARE_PROVIDER_SITE_OTHER): Payer: Medicare Other | Admitting: Psychiatry

## 2018-03-14 VITALS — BP 105/69 | HR 76

## 2018-03-14 DIAGNOSIS — F5101 Primary insomnia: Secondary | ICD-10-CM | POA: Diagnosis not present

## 2018-03-14 DIAGNOSIS — F330 Major depressive disorder, recurrent, mild: Secondary | ICD-10-CM | POA: Diagnosis not present

## 2018-03-14 DIAGNOSIS — F411 Generalized anxiety disorder: Secondary | ICD-10-CM

## 2018-03-14 MED ORDER — BUSPIRONE HCL 15 MG PO TABS: 15.0000 mg | ORAL_TABLET | Freq: Two times a day (BID) | ORAL | 1 refills | Status: DC

## 2018-03-14 MED ORDER — DULOXETINE HCL 60 MG PO CPEP: 60.0000 mg | ORAL_CAPSULE | Freq: Every day | ORAL | 5 refills | Status: DC

## 2018-03-14 MED ORDER — LORAZEPAM 1 MG PO TABS: ORAL_TABLET | ORAL | 5 refills | Status: DC
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
## 2018-03-15 ENCOUNTER — Ambulatory Visit: Payer: PRIVATE HEALTH INSURANCE | Admitting: Psychiatry

## 2018-03-16 DIAGNOSIS — Z6823 Body mass index (BMI) 23.0-23.9, adult: Secondary | ICD-10-CM | POA: Diagnosis not present

## 2018-03-16 DIAGNOSIS — R42 Dizziness and giddiness: Secondary | ICD-10-CM | POA: Diagnosis not present

## 2018-03-18 DIAGNOSIS — M25522 Pain in left elbow: Secondary | ICD-10-CM | POA: Diagnosis not present

## 2018-03-22 ENCOUNTER — Encounter

## 2018-03-22 ENCOUNTER — Ambulatory Visit (INDEPENDENT_AMBULATORY_CARE_PROVIDER_SITE_OTHER): Payer: Medicare Other | Admitting: Psychiatry

## 2018-03-22 DIAGNOSIS — F330 Major depressive disorder, recurrent, mild: Secondary | ICD-10-CM

## 2018-03-22 DIAGNOSIS — M25522 Pain in left elbow: Secondary | ICD-10-CM | POA: Diagnosis not present

## 2018-03-22 NOTE — Progress Notes (Signed)
      [facility] Counselor/Therapist Progress Note  Patient ID: [name], MRN: [id number],    Date: 03/22/2018  Time Spent:  60 minutes  Treatment Type: Individual Therapy  Reported Symptoms: sad, anxious, some depressed  Mental Status Exam:  Appearance:   Casual     Behavior:  Appropriate and Sharing  Motor:  Normal  Speech/Language:   Clear and Coherent  Affect:  Depressed  Mood:  anxious, depressed and sad  Thought process:  normal  Thought content:    WNL  Sensory/Perceptual disturbances:    WNL  Orientation:  oriented to person, place, time/date, situation, day of week, month of year and year  Attention:  Good  Concentration:  Good  Memory:  WNL  Fund of knowledge:   Good  Insight:    Good  Judgment:   Good  Impulse Control:  Good   Risk Assessment: Danger to Self:  No Self-injurious Behavior: No Danger to Others: No Duty to Warn:no Physical Aggression / Violence:No  Access to Firearms a concern: No  Gang Involvement:No   Subjective: Patient in today feeling sad about her and husband's current situation,especially with husband's Parkinson's disease and bipolar disorder.  Husband's demeanor, actions, physical movements, and understanding have all really changed and it's sad for patient to see on a daily basis.  Feels sad and then feels bad and guilty for feeling sad.  Talked through this at length as well as alternative ways of looking at the situation and becoming less judgmental  of herself.  Interventions: Cognitive Behavioral Therapy and Ego-Supportive  Diagnosis:   ICD-10-CM   1. Major depressive disorder, recurrent episode, mild ([place]) F33.0     Plan: Patient to follow through on strategies stated above to be more self accepting of her emotions regarding her and husband's current situation, and practice reframing some of her thoughts to be more self-caring.  To return in 1-2 wks.  Shanon Ace, LCSW

## 2018-03-23 DIAGNOSIS — R42 Dizziness and giddiness: Secondary | ICD-10-CM | POA: Diagnosis not present

## 2018-03-23 DIAGNOSIS — J300 Vasomotor rhinitis: Secondary | ICD-10-CM | POA: Diagnosis not present

## 2018-03-27 ENCOUNTER — Other Ambulatory Visit: Payer: Self-pay | Admitting: Obstetrics and Gynecology

## 2018-03-28 NOTE — Telephone Encounter (Signed)
Medication refill request: Yuvafem #[id number] Last AEX:  04-30-17 Next AEX: 05-19-18 Last MMG (if hormonal medication request): 06-17-17 3D Neg/density C/BiRads1 Refill authorized: Please advise

## 2018-03-29 ENCOUNTER — Ambulatory Visit (INDEPENDENT_AMBULATORY_CARE_PROVIDER_SITE_OTHER): Payer: Medicare Other | Admitting: Psychiatry

## 2018-03-29 DIAGNOSIS — F330 Major depressive disorder, recurrent, mild: Secondary | ICD-10-CM

## 2018-03-29 NOTE — Progress Notes (Signed)
      [facility] Counselor/Therapist Progress Note  Patient ID: [name], MRN: [id number],    Date: 03/29/2018  Time Spent:  60 minutes  Treatment Type: Individual Therapy  Reported Symptoms: sad, anxious, some depressed,   Mental Status Exam:  Appearance:   Casual     Behavior:  Appropriate and Sharing  Motor:  Normal  Speech/Language:   Clear and Coherent  Affect:  Depressed  Mood:  anxious, depressed and sad  Thought process:  normal  Thought content:    WNL  Sensory/Perceptual disturbances:    WNL  Orientation:  oriented to person, place, time/date, situation, day of week, month of year and year  Attention:  Good  Concentration:  Good  Memory:  WNL  Fund of knowledge:   Good  Insight:    Good  Judgment:   Good  Impulse Control:  Good   Risk Assessment: Danger to Self:  No Self-injurious Behavior: No Danger to Others: No Duty to Warn:no Physical Aggression / Violence:No  Access to Firearms a concern: No  Gang Involvement:No   Subjective:   Continued work on the sadness patient is experiencing re: husband's Parkinson's and overall decline especially mentally/emotionally.  Patient continues feeling sad about her and husband's current situation, with medical status and bipolar disorder.  Husband's demeanor, actions, physical movements, and understanding have all really changed and it's difficult for patient to handle on a daily basis, however she is making progress.  Feels sad and then feels bad and guilty for feeling sad.  Talked through this at length as well as alternative ways of looking at the situation and becoming less judgmental  of herself. Also focused on more community resources that may be of help and interest to them.  Interventions: Cognitive Behavioral Therapy and Ego-Supportive  Diagnosis:   ICD-10-CM   1. Major depressive disorder, recurrent episode, mild ([place]) F33.0     Plan: Patient to reach out and get more information on events that patient  and husband might could be involved in, as noted above.  Helps emotionally to get out and cost is a factor for them.  Patient to continue to follow through on strategies stated above to be more self accepting of her emotions regarding her and husband's current situation, and practice reframing some of her thoughts to be more self-caring.  To return in 1-2 wks.  Shanon Ace, LCSW

## 2018-03-31 DIAGNOSIS — M25522 Pain in left elbow: Secondary | ICD-10-CM | POA: Diagnosis not present

## 2018-04-04 DIAGNOSIS — M25522 Pain in left elbow: Secondary | ICD-10-CM | POA: Diagnosis not present

## 2018-04-05 ENCOUNTER — Ambulatory Visit: Payer: Medicare Other | Admitting: Psychiatry

## 2018-04-05 ENCOUNTER — Ambulatory Visit: Payer: PRIVATE HEALTH INSURANCE | Admitting: Psychiatry

## 2018-04-08 DIAGNOSIS — M25522 Pain in left elbow: Secondary | ICD-10-CM | POA: Diagnosis not present

## 2018-04-12 ENCOUNTER — Ambulatory Visit (INDEPENDENT_AMBULATORY_CARE_PROVIDER_SITE_OTHER): Payer: Medicare Other | Admitting: Psychiatry

## 2018-04-12 ENCOUNTER — Encounter: Payer: Self-pay | Admitting: Psychiatry

## 2018-04-12 DIAGNOSIS — M25522 Pain in left elbow: Secondary | ICD-10-CM | POA: Diagnosis not present

## 2018-04-12 DIAGNOSIS — F330 Major depressive disorder, recurrent, mild: Secondary | ICD-10-CM

## 2018-04-12 NOTE — Progress Notes (Signed)
      [facility] Counselor/Therapist Progress Note  Patient ID: [name], MRN: [id number],    Date: 04/12/2018  Time Spent:  58  minutes  Treatment Type:  Individual Therapy  Reported Symptoms: sad, anxious, some depressed,  grief re: husband's physical decline  Mental Status Exam:  Appearance:   Casual     Behavior:  Appropriate and Sharing  Motor:  Normal  Speech/Language:   Clear and Coherent  Affect:  Depressed  Mood:  anxious, depressed and sad  Thought process:  normal  Thought content:    WNL  Sensory/Perceptual disturbances:    WNL  Orientation:  oriented to person, place, time/date, situation, day of week, month of year and year  Attention:  Good  Concentration:  Good  Memory:  WNL  Fund of knowledge:   Good  Insight:    Good  Judgment:   Good  Impulse Control:  Good   Risk Assessment: Danger to Self:  No Self-injurious Behavior: No Danger to Others: No Duty to Warn:no Physical Aggression / Violence:No  Access to Firearms a concern: No  Gang Involvement:No   Subjective:  Patient in today with the above-mentioned symptoms.  Sadness and grief are the most acute symptoms right now, although the strength can come and go.  Often happens more in the a.m. but can occur at any time. Physical exercise helps, primarily walking and being outside. Audiobooks is also helpful. Just recently signed up for [employer] (for Parkinson's patients) for husband and patient is going to serve as a Psychologist, occupational with the group.  Continued work on the sadness patient is experiencing re: husband's Parkinson's and overall decline especially mentally/emotionally.  Patient continues feeling sad about her and husband's current situation, with medical status and bipolar disorder.  Husband's demeanor, actions, physical movements, and understanding have all really changed and it's difficult for patient to handle on a daily basis, however she is making progress.  Feels sad and then feels  bad and guilty for feeling sad.  Talked through this at length as well as alternative ways of looking at the situation and becoming less judgmental  of herself. Also focused on more community resources that may be of help and interest to them.  Interventions: Cognitive Behavioral Therapy and Ego-Supportive  Diagnosis:   ICD-10-CM   1. Major depressive disorder, recurrent episode, mild ([place]) F33.0     Plan: Patient to reach out and get more information on events that patient and husband might could be involved in, as noted above.  Helps emotionally to get out and cost is a factor for them.  Patient to continue to follow through on strategies stated above to be more self accepting of her emotions regarding her and husband's current situation, and practice reframing some of her thoughts to be more self-caring.  To return in 1-2 wks.  Anna Ace, LCSW

## 2018-04-15 DIAGNOSIS — M25522 Pain in left elbow: Secondary | ICD-10-CM | POA: Diagnosis not present

## 2018-04-18 ENCOUNTER — Ambulatory Visit (INDEPENDENT_AMBULATORY_CARE_PROVIDER_SITE_OTHER): Payer: Medicare Other | Admitting: Psychiatry

## 2018-04-18 DIAGNOSIS — F330 Major depressive disorder, recurrent, mild: Secondary | ICD-10-CM

## 2018-04-18 NOTE — Progress Notes (Signed)
      [facility] Counselor/Therapist Progress Note  Patient ID: SHALISHA CLAUSING, MRN: [id number],    Date: 04/18/2018  Time Spent:  58  minutes  Treatment Type:  Individual Therapy  Reported Symptoms: sad (lessened), anxious, grief re: husband's physical decline  Mental Status Exam:  Appearance:   Casual     Behavior:  Appropriate and Sharing  Motor:  Normal  Speech/Language:   Clear and Coherent  Affect:  Depressed  Mood:  anxious, depressed and sad  Thought process:  normal  Thought content:    WNL  Sensory/Perceptual disturbances:    WNL  Orientation:  oriented to person, place, time/date, situation, day of week, month of year and year  Attention:  Good  Concentration:  Good  Memory:  WNL  Fund of knowledge:   Good  Insight:    Good  Judgment:   Good  Impulse Control:  Good   Risk Assessment: Danger to Self:  No Self-injurious Behavior: No Danger to Others: No Duty to Warn:no Physical Aggression / Violence:No  Access to Firearms a concern: No  Gang Involvement:No   Subjective:   Depression and sadness has lessened some.  Patient's husband  making progress in his [employer] classes (for Parkinson's patients) and other activities there at their center such as "gentle yoga".  Physical exercise helps, primarily walking and being outside.    Patient continues feeling sad about her and husband's current situation (Parkinson's and bipolar),  however able to laugh more and sees "the good moments in a day".  Becoming less judgmental  of herself. Also focused on more community resources that may be of help and interest to them. Did get the name and # of a referral source for "respite care" for whenever needed. Reviewed personal skills for managing stress of caregiving as well as taking care of herself.  Interventions: Cognitive Behavioral Therapy and Ego-Supportive  Diagnosis:   ICD-10-CM   1. Major depressive disorder, recurrent episode, mild (HCC) F33.0     Plan:     Goal review with patient and progress noted. Patient to continue to follow through on strategies taught and reviewed in sessions to be more self accepting of her emotions regarding her and husband's current situation, and practice reframing some of her thoughts to be more self-caring, including it being ok to "take a break" from caregiving at times and seeing this as important self-care.  To return in 1-2 wks.  Shanon Ace, LCSW

## 2018-04-19 ENCOUNTER — Ambulatory Visit: Payer: PRIVATE HEALTH INSURANCE | Admitting: Psychiatry

## 2018-04-19 DIAGNOSIS — M25522 Pain in left elbow: Secondary | ICD-10-CM | POA: Diagnosis not present

## 2018-04-26 ENCOUNTER — Ambulatory Visit: Payer: PRIVATE HEALTH INSURANCE | Admitting: Psychiatry

## 2018-04-26 DIAGNOSIS — M25522 Pain in left elbow: Secondary | ICD-10-CM | POA: Diagnosis not present

## 2018-05-02 ENCOUNTER — Ambulatory Visit (INDEPENDENT_AMBULATORY_CARE_PROVIDER_SITE_OTHER): Payer: Medicare Other | Admitting: Psychiatry

## 2018-05-02 DIAGNOSIS — F330 Major depressive disorder, recurrent, mild: Secondary | ICD-10-CM | POA: Diagnosis not present

## 2018-05-02 NOTE — Progress Notes (Signed)
      [facility] Counselor/Therapist Progress Note  Patient ID: [name], MRN: [id number],    Date: 05/02/2018  Time Spent:  58  minutes  Treatment Type:  Individual Therapy  Reported Symptoms: sad (lessened), anxious, grief re: husband's physical decline, some anxiety re: new virus threat  Mental Status Exam:  Appearance:   Casual     Behavior:  Appropriate and Sharing  Motor:  Normal  Speech/Language:   Clear and Coherent  Affect:  Anxious, some depressed mood  Mood:  anxious, depressed and sad  Thought process:  normal  Thought content:    WNL  Sensory/Perceptual disturbances:    WNL  Orientation:  oriented to person, place, time/date, situation, day of week, month of year and year  Attention:  Good  Concentration:  Good  Memory:  WNL  Fund of knowledge:   Good  Insight:    Good  Judgment:   Good  Impulse Control:  Good   Risk Assessment: Danger to Self:  No Self-injurious Behavior: No Danger to Others: No Duty to Warn:no Physical Aggression / Violence:No  Access to Firearms a concern: No  Gang Involvement:No   Subjective:   Continued depression and sadness although less.  Patient's husband still making progress in his [employer] classes (for Parkinson's patients)..  Physical exercise helps, primarily walking and being outside.    Less judgmental of herself. Patient continues feeling sad about her and husband's current situation (Parkinson's and bipolar),  And discusses this at length, still seeing "the good moments in a day".   Reviewed personal skills for managing stress of caregiving as well as taking care of herself while being husband's primary caregiver.  Working to develop healthier cognitive patterns to be more positive in the midst of difficult circumstances.  Interventions: Cognitive Behavioral Therapy and Ego-Supportive  Diagnosis:   ICD-10-CM   1. Major depressive disorder, recurrent episode, mild (HCC) F33.0     Plan:    Goal review  per flowsheets with patient and progress noted. Patient to continue to follow through on strategies taught and reviewed in sessions to be more self accepting of her emotions regarding her and husband's current situation, and practice reframing some of her thoughts to be more self-caring, including it being ok to "take a break" from caregiving at times and seeing this as important self-care.  To return in 1-2 wks.  Shanon Ace, LCSW

## 2018-05-03 ENCOUNTER — Other Ambulatory Visit: Payer: Self-pay

## 2018-05-03 MED ORDER — VALACYCLOVIR HCL 500 MG PO TABS: ORAL_TABLET | ORAL | 0 refills | Status: DC

## 2018-05-03 NOTE — Telephone Encounter (Signed)
Medication refill request: Valtrex  Last AEX:  05/09/17 Next AEX: 05/19/18 Last MMG (if hormonal medication request):06/17/17 Bi-rads 1 Neg   Refill authorized:  #30 with 0 Rf to get her to her AEX

## 2018-05-06 ENCOUNTER — Other Ambulatory Visit: Payer: Self-pay | Admitting: Rheumatology

## 2018-05-06 DIAGNOSIS — M25522 Pain in left elbow: Secondary | ICD-10-CM | POA: Diagnosis not present

## 2018-05-06 NOTE — Telephone Encounter (Signed)
Last Visit: 11/30/17 Next Visit: 06/01/18  Okay to refill per Dr. Estanislado Pandy

## 2018-05-07 ENCOUNTER — Encounter: Payer: Self-pay | Admitting: Obstetrics and Gynecology

## 2018-05-10 ENCOUNTER — Ambulatory Visit: Payer: Medicare Other | Admitting: Psychiatry

## 2018-05-11 ENCOUNTER — Ambulatory Visit (INDEPENDENT_AMBULATORY_CARE_PROVIDER_SITE_OTHER): Payer: Medicare Other | Admitting: Psychiatry

## 2018-05-11 ENCOUNTER — Other Ambulatory Visit: Payer: Self-pay

## 2018-05-11 DIAGNOSIS — F330 Major depressive disorder, recurrent, mild: Secondary | ICD-10-CM | POA: Diagnosis not present

## 2018-05-11 NOTE — Progress Notes (Signed)
      [facility] Counselor/Therapist Progress Note  Patient ID: [name], MRN: [id number],    Date: 05/11/2018  Time Spent:  60  minutes  Treatment Type:  Individual Therapy  Reported Symptoms:  anxious, grief re: husband's physical decline, some anxiety re: new virus threat  Mental Status Exam:  Appearance:   Casual     Behavior:  Appropriate and Sharing  Motor:  Normal  Speech/Language:   Clear and Coherent  Affect:  Anxious, some depressed mood  Mood:  anxious, depressed and sad  Thought process:  normal  Thought content:    WNL  Sensory/Perceptual disturbances:    WNL  Orientation:  oriented to person, place, time/date, situation, day of week, month of year and year  Attention:  Good  Concentration:  Good  Memory:  WNL  Fund of knowledge:   Good  Insight:    Good  Judgment:   Good  Impulse Control:  Good   Risk Assessment: Danger to Self:  No Self-injurious Behavior: No Danger to Others: No Duty to Warn:no Physical Aggression / Violence:No  Access to Firearms a concern: No  Gang Involvement:No   Subjective:   Patient still experiencing anxiety and depression, and on some days it has lessened.  Sadness re: husband's illness and the unpredictability of it.  Some days are better and some are very challenging especially emotionally.    Physical exercise helps, primarily walking and being outside.  The [employer] classes (and relationships with others there (for [facility] patients) helps, along with good close friends, and involvement in her church helps.   Trying to see "the good moments in a day" each day, even those days that are especially difficult. Reviewed personal skills for managing stress of caregiving.   Interventions: Cognitive Behavioral Therapy and Ego-Supportive  Diagnosis:   ICD-10-CM   1. Major depressive disorder, recurrent episode, mild (HCC) F33.0     Plan:    Goal review per flowsheets with patient and progress noted. Patient  to continue to follow through on strategies taught and reviewed in sessions to be more self accepting of her emotions regarding her and husband's current situation, and practice reframing some of her thoughts to be more self-caring, including it being ok to "take a break" from caregiving at times and seeing this as important self-care.  To return in 1-2 wks.  Shanon Ace, LCSW

## 2018-05-13 DIAGNOSIS — M25522 Pain in left elbow: Secondary | ICD-10-CM | POA: Diagnosis not present

## 2018-05-19 ENCOUNTER — Ambulatory Visit: Payer: Medicare Other | Admitting: Obstetrics and Gynecology

## 2018-05-23 ENCOUNTER — Ambulatory Visit: Payer: Medicare Other | Admitting: Obstetrics and Gynecology

## 2018-05-24 ENCOUNTER — Ambulatory Visit: Payer: PRIVATE HEALTH INSURANCE | Admitting: Psychiatry

## 2018-05-27 ENCOUNTER — Ambulatory Visit: Payer: Medicare Other | Admitting: Psychiatry

## 2018-05-30 ENCOUNTER — Other Ambulatory Visit: Payer: Self-pay | Admitting: Psychiatry

## 2018-05-30 ENCOUNTER — Other Ambulatory Visit: Payer: Self-pay | Admitting: Obstetrics and Gynecology

## 2018-05-30 DIAGNOSIS — F331 Major depressive disorder, recurrent, moderate: Secondary | ICD-10-CM

## 2018-05-30 NOTE — Telephone Encounter (Signed)
Medication refill request: Valtrex Last AEX:  04/30/17 BS Next AEX: patient's appointment has been canceled Last MMG (if hormonal medication request): 06/17/17 BIRADS 1 negative/density c Refill authorized: Order pended for #30 w/0 refills if authorized

## 2018-06-01 ENCOUNTER — Ambulatory Visit: Payer: Self-pay | Admitting: Rheumatology

## 2018-06-06 ENCOUNTER — Ambulatory Visit (INDEPENDENT_AMBULATORY_CARE_PROVIDER_SITE_OTHER): Payer: Medicare Other | Admitting: Psychiatry

## 2018-06-06 DIAGNOSIS — F330 Major depressive disorder, recurrent, mild: Secondary | ICD-10-CM | POA: Diagnosis not present

## 2018-06-06 NOTE — Progress Notes (Signed)
[facility] Counselor/Therapist Progress Note  Patient ID: [name], MRN: [id number],    Date: 06/06/2018  Time Spent:  23  Minutes        2:58pm to 3:58pm  Treatment Type:  Individual Therapy   Virtual Visit via Video Note I connected with patient by a video enabled telemedicine application or telephone, with their informed consent, and verified patient privacy and that I am speaking with the correct person using two identifiers. I am at [place] and patient is at home.   I discussed the limitations, risks, security and privacy concerns of performing psychotherapy and management service by telephone and the availability of in person appointments. I also discussed with the patient that there may be a patient responsible charge related to this service. The patient expressed understanding and agreed to proceed.  I discussed the treatment planning with the patient. The patient was provided an opportunity to ask questions and all were answered. The patient agreed with the plan and demonstrated an understanding of the instructions.   The patient was advised to call  our office if  symptoms worsen or feel they are in a crisis state and need immediate contact.   Reported Symptoms:  anxious, grief re: husband's physical decline, some anxiety and fears re: new virus threat  Mental Status Exam:  Appearance:    Casual  Behavior:  Sharing  Motor:  Normal  Speech/Language:   Clear and Coherent  Affect:  anxious  Mood:  anxious, depressed and sad  Thought process:  normal  Thought content:    WNL  Sensory/Perceptual disturbances:    WNL  Orientation:  oriented to person, place, time/date, situation, day of week, month of year and year  Attention:  Good  Concentration:  Good  Memory:  WNL  Fund of knowledge:   Good  Insight:    Good  Judgment:   Good  Impulse Control:  Good   Risk Assessment: Danger to Self:  No Self-injurious Behavior: No Danger to Others:  No Duty to Warn:no Physical Aggression / Violence:No  Access to Firearms a concern: No  Gang Involvement:No   Subjective:    Patient reports the symptoms note above. Especially concerned with the coronavirus issues.  Fearful, anxious, and realizing how uncertain things are.  Difficulty in things being "out of our control" and "being uncertain".  Normalized her feelings as some others have felt similarly in these uncertain times.    Processes more about her husband's illness and the unpredictability of it.  Some days are better and some are very challenging especially emotionally.  States "he actually has done pretty well these past few days." One thing that really helps is physical exercise, primarily walking and being outside with their dog.  The [employer] classes and church are both closed until the virus situation clears out so they are really missing those things and seeing familiar people.    to see "the good moments in a day" each day, even those days that are especially difficult. Reviewed personal skills for managing stress of caregiving.   Interventions: Cognitive Behavioral Therapy and Ego-Supportive  Diagnosis:   ICD-10-CM   1. Major depressive disorder, recurrent episode, mild (HCC) F33.0     Plan:    Goal review per flowsheets with patient and progress noted. Patient to continue to follow through on strategies taught and reviewed in sessions to be more self accepting of her emotions regarding her and husband's current situation, and practice  reframing some of her thoughts to be more self-caring, including it being ok to "take a break" from caregiving at times and seeing this as important self-care.  Next session in 1-2 wks.  Shanon Ace, LCSW

## 2018-06-08 ENCOUNTER — Ambulatory Visit: Payer: Medicare Other | Admitting: Obstetrics and Gynecology

## 2018-06-20 ENCOUNTER — Other Ambulatory Visit: Payer: Self-pay

## 2018-06-20 ENCOUNTER — Ambulatory Visit (INDEPENDENT_AMBULATORY_CARE_PROVIDER_SITE_OTHER): Payer: Medicare Other | Admitting: Psychiatry

## 2018-06-20 DIAGNOSIS — F330 Major depressive disorder, recurrent, mild: Secondary | ICD-10-CM | POA: Diagnosis not present

## 2018-06-20 NOTE — Progress Notes (Signed)
[facility] Counselor/Therapist Progress Note  Patient ID: [name], MRN: [id number],    Date: 06/20/2018  Time Spent:  27  Minutes        11:55am. to 12:55pm  Treatment Type:  Individual Therapy   Virtual Visit  Note I connected with patient by a video enabled telemedicine application or telephone, with their informed consent, and verified patient privacy and that I am speaking with the correct person using two identifiers. I am at [place] and patient is at home.   I discussed the limitations, risks, security and privacy concerns of performing psychotherapy and management service by telephone and the availability of in person appointments. I also discussed with the patient that there may be a patient responsible charge related to this service. The patient expressed understanding and agreed to proceed.  I discussed the treatment planning with the patient. The patient was provided an opportunity to ask questions and all were answered. The patient agreed with the plan and demonstrated an understanding of the instructions.   The patient was advised to call  our office if  symptoms worsen or feel they are in a crisis state and need immediate contact.   Reported Symptoms:  anxious, grief re: husband's physical decline, some anxiety and fears re:  virus threat  Mental Status Exam:  Appearance:    Casual  Behavior:  Sharing  Motor:  Normal  Speech/Language:   Clear and Coherent  Affect:  anxious  Mood:  anxious, depressed and sad  Thought process:  normal  Thought content:    WNL  Sensory/Perceptual disturbances:    WNL  Orientation:  oriented to person, place, time/date, situation, day of week, month of year and year  Attention:  Good  Concentration:  Good  Memory:  WNL  Fund of knowledge:   Good  Insight:    Good  Judgment:   Good  Impulse Control:  Good   Risk Assessment: Danger to Self:  No Self-injurious Behavior: No Danger to Others: No Duty to  Warn:no Physical Aggression / Violence:No  Access to Firearms a concern: No  Gang Involvement:No   Subjective:    Patient continues have symptoms of depression, anxiety, stress, and fear.  Symptoms are concerned mostly with the coronavirus issues.  Fearful and anxious realizing how uncertain things are.  Difficulty in things being "out of our control" and "being uncertain".  Normalized her feelings as some others have felt similarly in these uncertain times.  The uncertainty of her husband's illness adds to her anxiety, fears, and stress.  We discussed this today and also reviewed what has helped and not helped in the past with times she struggled with anxiety, depression, and fear.  Reinforced good self-care (physical and mental), especially staying in touch with friends/family, eating healthy, maintaining some exercise, and participating in activities including reading, books on tape, playing with dog, play online word game, look at [place], and tv movies.  Interventions: Cognitive Behavioral Therapy and Ego-Supportive  Diagnosis:   ICD-10-CM   1. Major depressive disorder, recurrent episode, mild ([place]) F33.0     Plan:    Patient to continue to follow through on strategies taught and reviewed in sessions to better manage anxiety and stress and depression,  to be more self accepting of her emotions, and practice reframing some of her thoughts to be more self-caring, including it being ok to "take a break" from caregiving at times and seeing this as important self-care. Encourage the improvement she  sees in her husband's resilience recently.  Next session in 1-2 wks.  Shanon Ace, LCSW

## 2018-06-24 ENCOUNTER — Telehealth: Payer: Self-pay

## 2018-06-24 NOTE — Telephone Encounter (Signed)
Pt c/o side effects from Metoprolol she is taking. Pt states that she has been seeing things that she is not supposed to be seeing since starting the medication. Pt states that this only happens after waking up.

## 2018-06-24 NOTE — Telephone Encounter (Signed)
[name] has given consent for a [facility] visit with Dr. Acie Fredrickson. [name] has been advised to have BP, HR, and weight ready for visit.   YOUR CARDIOLOGY TEAM HAS ARRANGED FOR AN E-VISIT FOR YOUR APPOINTMENT - PLEASE REVIEW IMPORTANT INFORMATION BELOW SEVERAL DAYS PRIOR TO YOUR APPOINTMENT  Due to the recent COVID-19 pandemic, we are transitioning in-person office visits to tele-medicine visits in an effort to decrease unnecessary exposure to our patients and staff. Medicare and most insurances are covering these visits without a copay needed. You will need a working email and a smartphone or computer with a camera and microphone. For patients that do not have these items, we can still complete the visit using a telephone but do prefer video when possible. If possible, we also ask that you have a blood pressure cuff and scale at home to measure your blood pressure, heart rate and weight prior to your scheduled appointment. Patients with clinical needs that need an in-person evaluation and testing will still be able to come to the office if absolutely necessary. If you have any questions, feel free to call our office.     DOWNLOADING THE SOFTWARE  Download the [employer] app to enable video and telephone visits with your [facility] Provider.   Instructions for downloading [employer] WebEx: - Go to https://www.webex.com/downloads.html and follow the instructions, or download the app on your smartphone [facility] [employer] Meetings). - If you have technical difficulties with downloading WebEx, please call WebEx at [phone]. - Once the app is downloaded (can be done on either mobile or desktop computer), go to Settings in the upper left hand corner.  Be sure that camera and audio are enabled.  - You will receive an email message with a link to the meeting with a time to join for your tele-health visit.  - Please download the app and have settings configured prior to the appointment time.      2-3 DAYS BEFORE YOUR  APPOINTMENT  One of our staff will call you to confirm that you have been able to set up your WebEx account. We will remind you check your blood pressure, heart rate and weight prior to your scheduled appointment. If you have an Apple Watch or Kardia, please upload any pertinent ECG strips the day before or morning of your appointment to [place]. Our staff will also make sure you have reviewed the consent and agree to move forward with your scheduled tele-health visit.    THE DAY OF YOUR APPOINTMENT  Approximately 15-20 minutes prior to your scheduled appointment, you will receive an e-mail directly from one of our staff member's [email or web address] e-mail accounts inviting you to join a WebEx meeting.  Please do not reply to that email - simply join the [employer].  Upon joining, a member of the office staff will speak with you initially through the WebEx platform to confirm medications, vital signs for the day and any symptoms you may be experiencing.  Please have this information available prior to the time of visit start.      CONSENT FOR TELE-HEALTH VISIT - PLEASE RVIEW  I hereby voluntarily request, consent and authorize [facility] and its employed or contracted physicians, physician assistants, nurse practitioners or other licensed health care professionals (the Practitioner), to provide me with telemedicine health care services (the "Services") as deemed necessary by the treating Practitioner. I acknowledge and consent to receive the Services by the Practitioner via telemedicine. I understand that the telemedicine visit will involve communicating with the  Practitioner through live Emergency planning/management officer and the disclosure of certain medical information by electronic transmission. I acknowledge that I have been given the opportunity to request an in-person assessment or other available alternative prior to the telemedicine visit and am voluntarily participating in the  telemedicine visit.  I understand that I have the right to withhold or withdraw my consent to the use of telemedicine in the course of my care at any time, without affecting my right to future care or treatment, and that the Practitioner or I may terminate the telemedicine visit at any time. I understand that I have the right to inspect all information obtained and/or recorded in the course of the telemedicine visit and may receive copies of available information for a reasonable fee.  I understand that some of the potential risks of receiving the Services via telemedicine include:  Marland Kitchen Delay or interruption in medical evaluation due to technological equipment failure or disruption; . Information transmitted may not be sufficient (e.g. poor resolution of images) to allow for appropriate medical decision making by the Practitioner; and/or  . In rare instances, security protocols could fail, causing a breach of personal health information.  Furthermore, I acknowledge that it is my responsibility to provide information about my medical history, conditions and care that is complete and accurate to the best of my ability. I acknowledge that Practitioner's advice, recommendations, and/or decision may be based on factors not within their control, such as incomplete or inaccurate data provided by me or distortions of diagnostic images or specimens that may result from electronic transmissions. I understand that the practice of medicine is not an exact science and that Practitioner makes no warranties or guarantees regarding treatment outcomes. I acknowledge that I will receive a copy of this consent concurrently upon execution via email to the email address I last provided but may also request a printed copy by calling the office of [place].    I understand that my insurance will be billed for this visit.   I have read or had this consent read to me. . I understand the contents of this consent, which  adequately explains the benefits and risks of the Services being provided via telemedicine.  . I have been provided ample opportunity to ask questions regarding this consent and the Services and have had my questions answered to my satisfaction. . I give my informed consent for the services to be provided through the use of telemedicine in my medical care  By participating in this telemedicine visit I agree to the above.

## 2018-06-26 NOTE — Telephone Encounter (Signed)
That does not sound like a side effect from the metoprolol. She may hold it for a week and see how she does I

## 2018-06-27 ENCOUNTER — Telehealth: Payer: Self-pay | Admitting: Cardiovascular Disease

## 2018-06-27 ENCOUNTER — Other Ambulatory Visit: Payer: Self-pay | Admitting: *Deleted

## 2018-06-27 MED ORDER — ESTRADIOL 10 MCG VA TABS: ORAL_TABLET | VAGINAL | 0 refills | Status: DC

## 2018-06-27 NOTE — Telephone Encounter (Signed)
Spoke with patient who confirmed all demographics. Patient uses My Chart and has a smart phone.  Will have vitals ready for visit. °

## 2018-06-27 NOTE — Telephone Encounter (Signed)
Spoke with patient who states she is having hallucinations since starting Metoprolol. She states she had been taking the medication at night and thought this was causing the problem so she switched it to the morning. States hallucinations occur during the night but are very vivid even since she started taking metoprolol in the morning. She states she takes lorazepam to help with sleep but was taking that prior to starting metoprolol and did not have this problem before. I advised her to hold the metoprolol per Dr. Acie Fredrickson and we will follow-up during her virtual visit later this week. She verbalized understanding and agreement with plan and thanked me for the call.

## 2018-06-27 NOTE — Telephone Encounter (Signed)
Medication refill request: [name]  Last AEX:  04/30/17 Dr. Quincy Simmonds  Next AEX: none Last MMG (if hormonal medication request): 06/17/17 BIRADS1:Neg  Refill authorized: 03/28/18 #[id number]. Today please advise.

## 2018-06-30 ENCOUNTER — Telehealth (INDEPENDENT_AMBULATORY_CARE_PROVIDER_SITE_OTHER): Payer: Medicare Other | Admitting: Cardiovascular Disease

## 2018-06-30 ENCOUNTER — Encounter: Payer: Self-pay | Admitting: Cardiovascular Disease

## 2018-06-30 ENCOUNTER — Other Ambulatory Visit: Payer: Self-pay

## 2018-06-30 VITALS — BP 124/81 | HR 98 | Ht 65.5 in | Wt 139.0 lb

## 2018-06-30 DIAGNOSIS — I493 Ventricular premature depolarization: Secondary | ICD-10-CM

## 2018-06-30 MED ORDER — BISOPROLOL FUMARATE 5 MG PO TABS: 2.5000 mg | ORAL_TABLET | Freq: Every day | ORAL | 11 refills | Status: DC

## 2018-06-30 NOTE — Patient Instructions (Signed)
Medication Instructions:  Your physician has recommended you make the following change in your medication:  STOP Metoprolol Succinate (Toprol XL) START Bisoprolol (Zebeta) 2.5 mg once daily  If you need a refill on your cardiac medications before your next appointment, please call your pharmacy.    Lab work: None Ordered    Testing/Procedures: None Ordered    Follow-Up: At [facility], you and your health needs are our priority.  As part of our continuing mission to provide you with exceptional heart care, we have created designated Provider Care Teams.  These Care Teams include your primary Cardiologist (physician) and Advanced Practice Providers (APPs -  Physician Assistants and Nurse Practitioners) who all work together to provide you with the care you need, when you need it. You will need a follow up appointment in:  6 months.  Please call our office 2 months in advance to schedule this appointment.  You may see Mertie Moores, MD or one of the following Advanced Practice Providers on your designated Care Team: Richardson Dopp, PA-C [place], [place] . Daune Perch, NP
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
## 2018-07-04 ENCOUNTER — Ambulatory Visit (INDEPENDENT_AMBULATORY_CARE_PROVIDER_SITE_OTHER): Payer: Medicare Other | Admitting: Psychiatry

## 2018-07-04 ENCOUNTER — Other Ambulatory Visit: Payer: Self-pay

## 2018-07-04 ENCOUNTER — Telehealth: Payer: Self-pay | Admitting: Nurse Practitioner

## 2018-07-04 DIAGNOSIS — F330 Major depressive disorder, recurrent, mild: Secondary | ICD-10-CM

## 2018-07-04 MED ORDER — ATENOLOL 25 MG PO TABS: 25.0000 mg | ORAL_TABLET | Freq: Every day | ORAL | 11 refills | Status: DC

## 2018-07-04 NOTE — Telephone Encounter (Signed)
Agree with trial of Atenolol 25 mg a day

## 2018-07-04 NOTE — Telephone Encounter (Signed)
Patient sent MyChart encounter with complaints of hallucinations with bisoprolol after having no hallucinations while off of beta blocker therapy.  I reached out to one of our office pharmacists, Marcelle Overlie, [facility] who replied:  Certainly could be from the metoprolol and bisoprolol. Although rare, hallucinations have definitely been reported and they do both cross the blood brain barrier. I would try atenolol 25mg  daily as it does not cross blood brain barrier and is associated with the fewest reports. Or you could try switching classes to diltiazem ER 120mg  daily (hallucinations have also been reported with this <2%). Might try atenolol first.   Patient would like to stop bisoprolol and start atenolol 25 mg daily after a washout period of 2-5 days. The patient started the bisoprolol on the 5th day after stopping metoprolol. I advised her to call back with questions or concerns.

## 2018-07-04 NOTE — Progress Notes (Signed)
[facility] Counselor/Therapist Progress Note  Patient ID: [name], MRN: [id number],    Date: 07/04/2018  Time Spent:  18  Minutes        12:00noon to 1:00pm  Treatment Type:  Individual Therapy   Virtual Visit  Note I connected with patient by a video enabled telemedicine application or telephone, with their informed consent, and verified patient privacy and that I am speaking with the correct person using two identifiers. I am at [place] and patient is at home.   I discussed the limitations, risks, security and privacy concerns of performing psychotherapy and management service by telephone and the availability of in person appointments. I also discussed with the patient that there may be a patient responsible charge related to this service. The patient expressed understanding and agreed to proceed.  I discussed the treatment planning with the patient. The patient was provided an opportunity to ask questions and all were answered. The patient agreed with the plan and demonstrated an understanding of the instructions.   The patient was advised to call  our office if  symptoms worsen or feel they are in a crisis state and need immediate contact.   Reported Symptoms:  anxious, depressed (some less), some anxiety and fears re:  virus threat, sleep is interrupted and she is working with her cardiologist on this as they feel it is related to medication being prescribed there  Mental Status Exam:  Appearance:    N/A  (telehealth)  Behavior:  Sharing  Motor:  Normal  Speech/Language:   Clear and Coherent  Affect:  N/A  (telehealth)  Mood:  Anxious, depressed, fearful, apprehensive  Thought process:  normal  Thought content:    WNL  Sensory/Perceptual disturbances:    WNL  Orientation:  oriented to person, place, time/date, situation, day of week, month of year and year  Attention:  Good  Concentration:  Good  Memory:  WNL  Fund of knowledge:   Good  Insight:     Good  Judgment:   Good  Impulse Control:  Good   Risk Assessment: Danger to Self:  No Self-injurious Behavior: No Danger to Others: No Duty to Warn:no Physical Aggression / Violence:No  Access to Firearms a concern: No  Gang Involvement:No   Subjective:   Patient continues have symptoms of depression, anxiety, stress, and fear, and some sleep disruption and nightmares due to med prescribed by cardiologist and they are working on meds with patient. The doctor did stop the current medication and the side effects stopped.  Is going to try a different medication soon.   A lot of her current anxiety, depression, and fears are concerned mostly with the "Unknowns" of the coronavirus issues.  Fearful and anxious realizing how uncertain things are.  Having difficulty with things being "out of our control" and "being uncertain".  Again, normalized her feelings as some others have felt similarly in these uncertain times.  The uncertainty of her husband's illness also adds to her anxiety, fears, and stress.  Processed this more today and how disconnecting from chronic newscasts about the coronavirus situation as this fuels their anxiety and fears even more.  Reviewed other strategies that can help her better manage her current anxiety, stress, depression, and fearfulness.  Did seem to feel calmer at end of session.   Reinforced good self-care (physical and mental), especially staying in touch with friends/family, eating healthy, maintaining some exercise, and participating in activities including reading, books on tape, playing  with dog, play online word game, look at facebook, and tv movies.  Getting outside of house on daily basis and takes walks with husband and dog as part of her overall self-care.  Interventions: Cognitive Behavioral Therapy and Ego-Supportive  Diagnosis:   ICD-10-CM   1. Major depressive disorder, recurrent episode, mild ([place]) F33.0     Plan:    Patient to continue to follow  through on strategies reviewed in sessions to better manage anxiety and stress and depression,  to be more self accepting of her emotions, and practice reframing some of her thoughts to be more self-caring, including it being ok to "take a break" from caregiving at times and seeing this as important self-care. Next session in 1-2 wks.  Shanon Ace, LCSW

## 2018-07-13 ENCOUNTER — Ambulatory Visit (INDEPENDENT_AMBULATORY_CARE_PROVIDER_SITE_OTHER): Payer: Medicare Other | Admitting: Psychiatry

## 2018-07-13 ENCOUNTER — Other Ambulatory Visit: Payer: Self-pay

## 2018-07-13 DIAGNOSIS — F330 Major depressive disorder, recurrent, mild: Secondary | ICD-10-CM

## 2018-07-13 DIAGNOSIS — F411 Generalized anxiety disorder: Secondary | ICD-10-CM | POA: Diagnosis not present

## 2018-07-13 DIAGNOSIS — F5101 Primary insomnia: Secondary | ICD-10-CM | POA: Diagnosis not present

## 2018-07-13 MED ORDER — BUSPIRONE HCL 15 MG PO TABS: 15.0000 mg | ORAL_TABLET | Freq: Two times a day (BID) | ORAL | 1 refills | Status: DC

## 2018-07-13 MED ORDER — METFORMIN HCL 500 MG PO TABS: 500.0000 mg | ORAL_TABLET | Freq: Two times a day (BID) | ORAL | 1 refills | Status: DC

## 2018-07-13 MED ORDER — DULOXETINE HCL 60 MG PO CPEP: 60.0000 mg | ORAL_CAPSULE | Freq: Every day | ORAL | 5 refills | Status: DC
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
## 2018-07-14 DIAGNOSIS — R040 Epistaxis: Secondary | ICD-10-CM | POA: Insufficient documentation

## 2018-07-14 DIAGNOSIS — H90A22 Sensorineural hearing loss, unilateral, left ear, with restricted hearing on the contralateral side: Secondary | ICD-10-CM | POA: Diagnosis not present

## 2018-07-14 DIAGNOSIS — H9313 Tinnitus, bilateral: Secondary | ICD-10-CM | POA: Diagnosis not present

## 2018-07-14 DIAGNOSIS — Z974 Presence of external hearing-aid: Secondary | ICD-10-CM | POA: Diagnosis not present

## 2018-07-14 DIAGNOSIS — H903 Sensorineural hearing loss, bilateral: Secondary | ICD-10-CM | POA: Diagnosis not present

## 2018-07-14 DIAGNOSIS — Z7289 Other problems related to lifestyle: Secondary | ICD-10-CM | POA: Diagnosis not present

## 2018-07-14 DIAGNOSIS — H90A31 Mixed conductive and sensorineural hearing loss, unilateral, right ear with restricted hearing on the contralateral side: Secondary | ICD-10-CM | POA: Diagnosis not present

## 2018-07-14 DIAGNOSIS — J343 Hypertrophy of nasal turbinates: Secondary | ICD-10-CM | POA: Diagnosis not present

## 2018-07-15 ENCOUNTER — Encounter: Payer: Self-pay | Admitting: Psychiatry

## 2018-07-18 ENCOUNTER — Other Ambulatory Visit: Payer: Self-pay

## 2018-07-18 ENCOUNTER — Ambulatory Visit (INDEPENDENT_AMBULATORY_CARE_PROVIDER_SITE_OTHER): Payer: Medicare Other | Admitting: Psychiatry

## 2018-07-18 DIAGNOSIS — F330 Major depressive disorder, recurrent, mild: Secondary | ICD-10-CM

## 2018-07-18 NOTE — Progress Notes (Signed)
[facility] Counselor/Therapist Progress Note  Patient ID: [name], MRN: [id number],    Date: 07/18/2018  Time Spent:  70  Minutes        12:00noon to 1:00pm  Treatment Type:  Individual Therapy   Virtual Visit  Note I connected with patient by a video enabled telemedicine application or telephone, with their informed consent, and verified patient privacy and that I am speaking with the correct person using two identifiers. I am at [place] and patient is at home.   I discussed the limitations, risks, security and privacy concerns of performing psychotherapy and management service by telephone and the availability of in person appointments. I also discussed with the patient that there may be a patient responsible charge related to this service. The patient expressed understanding and agreed to proceed.  I discussed the treatment planning with the patient. The patient was provided an opportunity to ask questions and all were answered. The patient agreed with the plan and demonstrated an understanding of the instructions.   The patient was advised to call  our office if  symptoms worsen or feel they are in a crisis state and need immediate contact.   Reported Symptoms:  anxious, depressed (some less), some anxiety and fears re:  virus threat.  Some physical issues with vertigo (balance and dizziness).  Any new medications since last appt:  Atenolol (for heart palpitations by cardiologist, Dr. Cathie Olden.  Any health changes:  Vertigo (balance, dizzy, ears ringing)--has appt Wed. with doctor  Mental Status Exam:  Appearance:    N/A  (telehealth)  Behavior:  Sharing  Motor:  Normal  Speech/Language:   Clear and Coherent  Affect:  N/A  (telehealth)  Mood:  Anxious, depressed, fearful, apprehensive  Thought process:  normal  Thought content:    WNL  Sensory/Perceptual disturbances:    WNL  Orientation:  oriented to person, place, time/date, situation, day of week,  month of year and year  Attention:  Good  Concentration:  Good  Memory:  WNL  Fund of knowledge:   Good  Insight:    Good  Judgment:   Good  Impulse Control:  Good   Risk Assessment: Danger to Self:  No Self-injurious Behavior: No Danger to Others: No Duty to Warn:no Physical Aggression / Violence:No  Access to Firearms a concern: No  Gang Involvement:No   Subjective:   Patient having some recently developed physical symptoms of vertigo (including dizziness, ears ringing, and balance issues).  Can't drive and some walking is unsteady. Has appt to be seen at [place] office this Wednesday. This has exacerbated her symptoms of anxiety, depression, and some additional fears with all the uncertainty in the world right now and with her current physical concerns. Cardiologist did change meds (recently added Atenolol) and her sleep disruptions and nightmares have stopped, providing patient relief and improved sleep.    Current anxiety, depression, and fears are concerned mostly with the "Unknowns" of the coronavirus issues and her more recent physical issues.  Having difficulty with things being "out of our control" and "being uncertain".  The uncertainty of her husband's illness also adds to her anxiety, fears, and stress, however he is actually doing some better.  Due to symptoms of anxiety and depression heightened some, she is encouraged to disconnect more from the news on TV and online as this feeds her fears and anxiety even more. Reviewed other strategies that can help her better manage her current anxiety, stress, depression, and  fearfulness.  Reinforced good self-care (physical and mental), especially staying in touch with friends/family, eating healthy, maintaining some exercise (if able), and participating in activities including reading, books on tape, time with her dog, play online word game, and tv movies.  Getting outside of house on daily basis as able right now with husband as part  of her overall self-care.  Interventions: Cognitive Behavioral Therapy and Ego-Supportive  Diagnosis:   ICD-10-CM   1. Major depressive disorder, recurrent episode, mild ([place]) F33.0     Plan:    Patient to continue to follow through on strategies reviewed in sessions to better manage anxiety and stress and depression,  to be more self accepting of her emotions, and practice reframing some of her thoughts to be more self-caring.  Next session in 1-2 wks.  Shanon Ace, LCSW

## 2018-07-20 ENCOUNTER — Encounter: Payer: Self-pay | Admitting: Rheumatology

## 2018-07-28 ENCOUNTER — Telehealth: Payer: Self-pay | Admitting: *Deleted

## 2018-07-28 MED ORDER — CYCLOBENZAPRINE HCL 10 MG PO TABS: ORAL_TABLET | ORAL | 0 refills | Status: DC

## 2018-07-28 NOTE — Telephone Encounter (Signed)
Refill request received via fax  Last Visit: 11/30/17 Next Visit: due in April 2020. Message sent to the front to schedule patient  Okay to refill per Dr. Estanislado Pandy

## 2018-07-28 NOTE — Telephone Encounter (Signed)
Please schedule patient for a follow up visit. Thanks! Patient was due April 2020.

## 2018-07-28 NOTE — Telephone Encounter (Signed)
LMOM for patient to call and schedule follow-up appointment.   °

## 2018-08-01 ENCOUNTER — Ambulatory Visit (INDEPENDENT_AMBULATORY_CARE_PROVIDER_SITE_OTHER): Payer: Medicare Other | Admitting: Psychiatry

## 2018-08-01 ENCOUNTER — Other Ambulatory Visit: Payer: Self-pay

## 2018-08-01 DIAGNOSIS — F330 Major depressive disorder, recurrent, mild: Secondary | ICD-10-CM | POA: Diagnosis not present
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
## 2018-08-09 DIAGNOSIS — L299 Pruritus, unspecified: Secondary | ICD-10-CM | POA: Diagnosis not present

## 2018-08-09 DIAGNOSIS — D229 Melanocytic nevi, unspecified: Secondary | ICD-10-CM | POA: Diagnosis not present

## 2018-08-09 DIAGNOSIS — L820 Inflamed seborrheic keratosis: Secondary | ICD-10-CM | POA: Diagnosis not present

## 2018-08-10 ENCOUNTER — Other Ambulatory Visit: Payer: Self-pay

## 2018-08-10 ENCOUNTER — Ambulatory Visit (HOSPITAL_COMMUNITY)
Admission: EM | Admit: 2018-08-10 | Discharge: 2018-08-10 | Disposition: A | Discharge status: Home / Self Care | Payer: Medicare Other | Attending: Family Medicine | Admitting: Family Medicine

## 2018-08-10 ENCOUNTER — Encounter (HOSPITAL_COMMUNITY): Payer: Self-pay | Admitting: Emergency Medicine

## 2018-08-10 DIAGNOSIS — W260XXA Contact with knife, initial encounter: Secondary | ICD-10-CM | POA: Diagnosis not present

## 2018-08-10 DIAGNOSIS — Z23 Encounter for immunization: Secondary | ICD-10-CM | POA: Diagnosis not present

## 2018-08-10 DIAGNOSIS — S81811A Laceration without foreign body, right lower leg, initial encounter: Secondary | ICD-10-CM | POA: Diagnosis not present

## 2018-08-10 MED ORDER — LIDOCAINE HCL (PF) 1 % IJ SOLN
INTRAMUSCULAR | Status: AC
  Filled 2018-08-10: qty 2

## 2018-08-10 MED ORDER — TETANUS-DIPHTH-ACELL PERTUSSIS 5-2.5-18.5 LF-MCG/0.5 IM SUSP
INTRAMUSCULAR | Status: AC
  Filled 2018-08-10: qty 0.5

## 2018-08-10 MED ORDER — TETANUS-DIPHTH-ACELL PERTUSSIS 5-2.5-18.5 LF-MCG/0.5 IM SUSP
0.5000 mL | Freq: Once | INTRAMUSCULAR | Status: AC
  Administered 2018-08-10: 0.5 mL via INTRAMUSCULAR

## 2018-08-10 MED ORDER — LIDOCAINE-EPINEPHRINE (PF) 2 %-1:200000 IJ SOLN
INTRAMUSCULAR | Status: AC
  Filled 2018-08-10: qty 20

## 2018-08-10 NOTE — ED Triage Notes (Signed)
Laceration after a knife fell out of dish rack and cut right lower leg. Dressing from home is intact and dry

## 2018-08-10 NOTE — Discharge Instructions (Addendum)
Keep area clean and dry. Return if you develop worsening pain, purulent discharge, surrounding redness, fever.
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
## 2018-08-15 ENCOUNTER — Encounter: Payer: Self-pay | Admitting: Obstetrics and Gynecology

## 2018-08-15 ENCOUNTER — Ambulatory Visit: Payer: Medicare Other | Admitting: Psychiatry

## 2018-08-15 ENCOUNTER — Other Ambulatory Visit: Payer: Self-pay

## 2018-08-15 ENCOUNTER — Ambulatory Visit (INDEPENDENT_AMBULATORY_CARE_PROVIDER_SITE_OTHER): Payer: Medicare Other | Admitting: Obstetrics and Gynecology

## 2018-08-15 VITALS — BP 110/70 | HR 72 | Temp 97.6°F | Resp 12 | Ht 65.0 in | Wt 137.6 lb

## 2018-08-15 DIAGNOSIS — Z01419 Encounter for gynecological examination (general) (routine) without abnormal findings: Secondary | ICD-10-CM | POA: Diagnosis not present

## 2018-08-15 MED ORDER — VALACYCLOVIR HCL 500 MG PO TABS: ORAL_TABLET | ORAL | 11 refills | Status: DC

## 2018-08-15 MED ORDER — YUVAFEM 10 MCG VA TABS: ORAL_TABLET | VAGINAL | 11 refills | Status: DC

## 2018-08-15 NOTE — Patient Instructions (Signed)
Try Estoven for an herbal remedy for hot flashes.  You do not need a prescription for this.   EXERCISE AND DIET:  We recommended that you start or continue a regular exercise program for good health. Regular exercise means any activity that makes your heart beat faster and makes you sweat.  We recommend exercising at least 30 minutes per day at least 3 days a week, preferably 4 or 5.  We also recommend a diet low in fat and sugar.  Inactivity, poor dietary choices and obesity can cause diabetes, heart attack, stroke, and kidney damage, among others.    ALCOHOL AND SMOKING:  Women should limit their alcohol intake to no more than 7 drinks/beers/glasses of wine (combined, not each!) per week. Moderation of alcohol intake to this level decreases your risk of breast cancer and liver damage. And of course, no recreational drugs are part of a healthy lifestyle.  And absolutely no smoking or even second hand smoke. Most people know smoking can cause heart and lung diseases, but did you know it also contributes to weakening of your bones? Aging of your skin?  Yellowing of your teeth and nails?  CALCIUM AND VITAMIN D:  Adequate intake of calcium and Vitamin D are recommended.  The recommendations for exact amounts of these supplements seem to change often, but generally speaking 600 mg of calcium (either carbonate or citrate) and 800 units of Vitamin D per day seems prudent. Certain women may benefit from higher intake of Vitamin D.  If you are among these women, your doctor will have told you during your visit.    PAP SMEARS:  Pap smears, to check for cervical cancer or precancers,  have traditionally been done yearly, although recent scientific advances have shown that most women can have pap smears less often.  However, every woman still should have a physical exam from her gynecologist every year. It will include a breast check, inspection of the vulva and vagina to check for abnormal growths or skin changes, a  visual exam of the cervix, and then an exam to evaluate the size and shape of the uterus and ovaries.  And after 70 years of age, a rectal exam is indicated to check for rectal cancers. We will also provide age appropriate advice regarding health maintenance, like when you should have certain vaccines, screening for sexually transmitted diseases, bone density testing, colonoscopy, mammograms, etc.   MAMMOGRAMS:  All women over 42 years old should have a yearly mammogram. Many facilities now offer a "3D" mammogram, which may cost around $50 extra out of pocket. If possible,  we recommend you accept the option to have the 3D mammogram performed.  It both reduces the number of women who will be called back for extra views which then turn out to be normal, and it is better than the routine mammogram at detecting truly abnormal areas.    COLONOSCOPY:  Colonoscopy to screen for colon cancer is recommended for all women at age 59.  We know, you hate the idea of the prep.  We agree, BUT, having colon cancer and not knowing it is worse!!  Colon cancer so often starts as a polyp that can be seen and removed at colonscopy, which can quite literally save your life!  And if your first colonoscopy is normal and you have no family history of colon cancer, most women don't have to have it again for 10 years.  Once every ten years, you can do something that may end  up saving your life, right?  We will be happy to help you get it scheduled when you are ready.  Be sure to check your insurance coverage so you understand how much it will cost.  It may be covered as a preventative service at no cost, but you should check your particular policy.
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
## 2018-08-18 ENCOUNTER — Other Ambulatory Visit: Payer: Self-pay

## 2018-08-18 ENCOUNTER — Ambulatory Visit (INDEPENDENT_AMBULATORY_CARE_PROVIDER_SITE_OTHER): Payer: Medicare Other | Admitting: Psychiatry

## 2018-08-18 DIAGNOSIS — F330 Major depressive disorder, recurrent, mild: Secondary | ICD-10-CM
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
## 2018-08-21 ENCOUNTER — Other Ambulatory Visit: Payer: Self-pay

## 2018-08-21 ENCOUNTER — Encounter (HOSPITAL_COMMUNITY): Payer: Self-pay | Admitting: Family Medicine

## 2018-08-21 ENCOUNTER — Ambulatory Visit (HOSPITAL_COMMUNITY)
Admission: EM | Admit: 2018-08-21 | Discharge: 2018-08-21 | Disposition: A | Discharge status: Home / Self Care | Payer: Medicare Other | Attending: Family Medicine | Admitting: Family Medicine

## 2018-08-21 DIAGNOSIS — Z4802 Encounter for removal of sutures: Secondary | ICD-10-CM | POA: Diagnosis not present

## 2018-08-21 DIAGNOSIS — S81811D Laceration without foreign body, right lower leg, subsequent encounter: Secondary | ICD-10-CM

## 2018-08-21 DIAGNOSIS — M25571 Pain in right ankle and joints of right foot: Secondary | ICD-10-CM | POA: Diagnosis not present
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
## 2018-08-21 NOTE — Discharge Instructions (Signed)
Continue OTC analgesics, ice after going for walks. Return if you develop redness, worsening pain, swelling, difficulty ambulating.

## 2018-08-21 NOTE — ED Triage Notes (Signed)
Pt presents for suture removal; sutures placed right distal lower leg 08/10/18 at [facility].  Wound well approximated without any S/S infection.  C/O right ankle pain, tenderness, and swelling distal to wound.  Wishes to have eval.

## 2018-08-23 DIAGNOSIS — H9313 Tinnitus, bilateral: Secondary | ICD-10-CM | POA: Diagnosis not present

## 2018-08-26 DIAGNOSIS — L237 Allergic contact dermatitis due to plants, except food: Secondary | ICD-10-CM | POA: Diagnosis not present

## 2018-08-29 ENCOUNTER — Other Ambulatory Visit: Payer: Self-pay

## 2018-08-29 ENCOUNTER — Ambulatory Visit (INDEPENDENT_AMBULATORY_CARE_PROVIDER_SITE_OTHER): Payer: Medicare Other | Admitting: Psychiatry

## 2018-08-29 DIAGNOSIS — F330 Major depressive disorder, recurrent, mild: Secondary | ICD-10-CM

## 2018-08-29 NOTE — Progress Notes (Signed)
[facility] Counselor/Therapist Progress Note  Patient ID: KHILEY LIESER, MRN: [id number],    Date: 08/29/2018  Time Spent:  67  Minutes        12:00noon to 1:00pm  Treatment Type:  Individual Therapy   Virtual Visit  Note I connected with patient by a video enabled telemedicine application or telephone, with their informed consent, and verified patient privacy and that I am speaking with the correct person using two identifiers. I am at [place] and patient is at home.   I discussed the limitations, risks, security and privacy concerns of performing psychotherapy and management service by telephone and the availability of in person appointments. I also discussed with the patient that there may be a patient responsible charge related to this service. The patient expressed understanding and agreed to proceed.  I discussed the treatment planning with the patient. The patient was provided an opportunity to ask questions and all were answered. The patient agreed with the plan and demonstrated an understanding of the instructions.   The patient was advised to call  our office if  symptoms worsen or feel they are in a crisis state and need immediate contact.   Reported Symptoms:  anxious, depressed, some fears re: virus threat "but not quite as bad.".  Vertigo has subsided, and tinnitus has continued off and on.   Any new medications since last appt:  None reported  Any health changes:    No further episodes of vertigo since last appt.  Tinnitus has continued.  Has appt coming up June 30th re: her vertigo with specialist connected with [facility].  Mental Status Exam:  Appearance:    N/A  (telehealth)  Behavior:  Sharing  Motor:  Normal  Speech/Language:   Clear and Coherent  Affect:  N/A  (telehealth)  Mood:  Anxious, depressed, apprehensive  Thought process:  normal  Thought content:    WNL  Sensory/Perceptual disturbances:    WNL  Orientation:  oriented to  person, place, time/date, situation, day of week, month of year and year  Attention:  Good  Concentration:  Good  Memory:  WNL  Fund of knowledge:   Good  Insight:    Good  Judgment:   Good  Impulse Control:  Good   Risk Assessment: Danger to Self:  No Self-injurious Behavior: No Danger to Others: No Duty to Warn:no Physical Aggression / Violence:No  Access to Firearms a concern: No  Gang Involvement:No   Subjective:    Patient's tinnitus has been increased although still episodic more recently and does seem to worsen during periods of stress.  Sounds somewhat like  "roar", sometimes a "high pitch". The vertigo has decreased for now.  Her husband's health concerns had been unusually better during this pandemic time until these last few days, patient has worried due to some breathing issues but it was felt that it might be his asthma.  Had suggested to her that due to symptoms of anxiety and depression, it would be good for her to limit her consumption of the news on TV and online as this feeds her fears and anxiety even more.   Also reports "I've been more agitated and anxious recently" and I think it may be worried about [name], and some may be related to book study she is doing with lots of emotional overtones for patient re: racial tension. *Realized this is what she really needed to talk more about today. (Some info not shared in note here,  per patient request for privacy reasons.)  Processed this some with patient as she feels a lot of guilt and mixed feelings over several things within family. To do some homework around these issues and the tinnitus concerns (CBT) prior to next session.   Reviewed other strategies that can help her better manage her current anxiety, stress, depression, and fearfulness.  Her faith is a consolation and support to her as are her church members. Reviewed and reinforced good self-care (physical and mental), especially staying in touch with friends/family,  eating healthy, maintaining some exercise (if able), and participating in activities including reading, books on tape, time with her dog, play online word game, and tv movies, while limiting the TV exposure of pandemic and violence news.  Getting outside of house on daily basis as able right now (except when raining) with husband and dog as part of her overall self-care.  Interventions: Cognitive Behavioral Therapy and Ego-Supportive  Diagnosis:   ICD-10-CM   1. Major depressive disorder, recurrent episode, mild ([place])  F33.0     Plan:    Patient to continue to follow through on strategies and recommendations reviewed in sessions to better manage anxiety and stress and depression,  to be more self accepting of her emotions and feelings without judging them, and practice reframing some of her thoughts to be more self-caring.  Next session in 2-3  wks.  Shanon Ace, LCSW

## 2018-08-30 DIAGNOSIS — H903 Sensorineural hearing loss, bilateral: Secondary | ICD-10-CM | POA: Diagnosis not present

## 2018-08-30 DIAGNOSIS — R42 Dizziness and giddiness: Secondary | ICD-10-CM | POA: Diagnosis not present

## 2018-08-30 DIAGNOSIS — R51 Headache: Secondary | ICD-10-CM | POA: Diagnosis not present

## 2018-09-07 ENCOUNTER — Ambulatory Visit (INDEPENDENT_AMBULATORY_CARE_PROVIDER_SITE_OTHER): Payer: Medicare Other | Admitting: Psychiatry

## 2018-09-07 ENCOUNTER — Other Ambulatory Visit: Payer: Self-pay

## 2018-09-07 DIAGNOSIS — F330 Major depressive disorder, recurrent, mild: Secondary | ICD-10-CM

## 2018-09-07 NOTE — Progress Notes (Signed)
[facility] Counselor/Therapist Progress Note  Patient ID: [name], MRN: [id number],    Date: 09/07/2018  Time Spent:  9  Minutes        11:00am to 12noon  Treatment Type:  Individual Therapy   Virtual Visit  Note I connected with patient by a video enabled telemedicine application or telephone, with their informed consent, and verified patient privacy and that I am speaking with the correct person using two identifiers. I am at [place] and patient is at home.   I discussed the limitations, risks, security and privacy concerns of performing psychotherapy and management service by telephone and the availability of in person appointments. I also discussed with the patient that there may be a patient responsible charge related to this service. The patient expressed understanding and agreed to proceed.  I discussed the treatment planning with the patient. The patient was provided an opportunity to ask questions and all were answered. The patient agreed with the plan and demonstrated an understanding of the instructions.   The patient was advised to call  our office if  symptoms worsen or feel they are in a crisis state and need immediate contact.    Reported Symptoms:  anxious, depressed, some fears re: virus threat "but not quite as bad.".  Vertigo has subsided, and tinnitus has continued off and on.   Any new medications since last appt:  None reported  Any health changes:    No further episodes of vertigo since last appt.  Tinnitus has continued some but more mild.  Met appt June 30th re: her vertigo with specialist connected with [facility]. Patient reports that [facility] doctor feels she may be having vestibular migraines.   Mental Status Exam:  Appearance:    N/A  (telehealth)  Behavior:  Sharing  Motor:  Normal  Speech/Language:   Clear and Coherent  Affect:  N/A  (telehealth)  Mood:  Anxious, depressed, apprehensive  Thought process:  normal   Thought content:    WNL  Sensory/Perceptual disturbances:    WNL  Orientation:  oriented to person, place, time/date, situation, day of week, month of year and year  Attention:  Good  Concentration:  Good  Memory:  WNL  Fund of knowledge:   Good  Insight:    Good  Judgment:   Good  Impulse Control:  Good   Risk Assessment: Danger to Self:  No Self-injurious Behavior: No Danger to Others: No Duty to Warn:no Physical Aggression / Violence:No  Access to Firearms a concern: No  Gang Involvement:No   Subjective:   Patient did meet her appt at [facility] last week re: her vertigo. 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  Patient said Dr said they could treat her there with medication or she could see her neurologist,  in [place], or could be referred to [facility] specialist. Patient already has appt with her neurologist [facility] next [facility] so she plans to discuss situation with him. Her tinnitus has been more mild since last appt and there has not been any more occurrences of vertigo during this time. Her husband's health concerns had been unusually better during this pandemic time.  Had suggested to her that due to symptoms of anxiety and depression, it would be good for her to limit her consumption of the news on TV and online as this feeds her fears and anxiety even more.  Stress and anxiety continue about the  coronavirus and racial tensions and is better with using strategies to help manage her anxieties.  Also noticed that the anxiety may have heightened some as she and [name] are currently in a book study with racial issues being discussed (and this brought up issues with one of her daughters who is [place] and very dark skin, and there's been issues within the family on that.)  Talked through a lot of this today as well as her concerns/fears about her vertigo. Seemed to feel some relief in venting her thoughts/feelings as she doesn't  "talk to very many people about these things".  Worked with some CBT techniques again today (especially "was I a good enough mother to my brown children?)  Shared her homework assignment that she worked on with the CBT and due to running out of time, we will pick up on this next session.   Her faith remains a consolation and support to her as are her church members. Reviewed and reinforced good self-care (physical and mental), especially staying in touch with friends/family, eating healthy, maintaining some exercise (if able), and participating in activities including reading, books on tape, time with her dog, play online word game, and tv movies, while limiting the TV exposure of pandemic and violence news.  Getting outside of house on daily basis as able right now (except when raining) with husband and dog as part of her overall self-care.  Interventions: Cognitive Behavioral Therapy and Ego-Supportive  Diagnosis:   ICD-10-CM   1. Major depressive disorder, recurrent episode, mild ([place])  F33.0     Plan:    Patient to continue to follow through on strategies and recommendations reviewed in sessions to better manage anxiety and stress and depression,  to be more self accepting of her emotions and feelings without judging them, and practice reframing some of her thoughts to be more self-caring.  Next session within  2 wks.  Shanon Ace, LCSW

## 2018-09-12 ENCOUNTER — Ambulatory Visit (INDEPENDENT_AMBULATORY_CARE_PROVIDER_SITE_OTHER): Payer: Medicare Other | Admitting: Psychiatry

## 2018-09-12 ENCOUNTER — Other Ambulatory Visit: Payer: Self-pay

## 2018-09-12 DIAGNOSIS — F330 Major depressive disorder, recurrent, mild: Secondary | ICD-10-CM | POA: Diagnosis not present

## 2018-09-12 NOTE — Progress Notes (Signed)
[facility] Counselor/Therapist Progress Note  Patient ID: [name], MRN: [id number],    Date: 09/12/2018  Time Spent:  58  Minutes        11:00am to 12noon  Treatment Type:  Individual Therapy   Virtual Visit  Note I connected with patient by a video enabled telemedicine application or telephone, with their informed consent, and verified patient privacy and that I am speaking with the correct person using two identifiers. I am at [place] and patient is at home.   I discussed the limitations, risks, security and privacy concerns of performing psychotherapy and management service by telephone and the availability of in person appointments. I also discussed with the patient that there may be a patient responsible charge related to this service. The patient expressed understanding and agreed to proceed.  I discussed the treatment planning with the patient. The patient was provided an opportunity to ask questions and all were answered. The patient agreed with the plan and demonstrated an understanding of the instructions.   The patient was advised to call  our office if  symptoms worsen or feel they are in a crisis state and need immediate contact.   Reported Symptoms:  anxious, depressed, some fears re: virus threat "but not quite as bad.".  Vertigo has subsided, and tinnitus has continued off and on.   Any new medications since last appt:  None reported  Any health changes:    No further episodes of vertigo since last appt.  Tinnitus has continued some but more mild.  Met appt June 30th re: her vertigo with specialist connected with [facility]. Patient reports that [facility] doctor feels she may be having vestibular migraines.   Mental Status Exam:  Appearance:    N/A  (telehealth)  Behavior:  Sharing  Motor:  Normal  Speech/Language:   Clear and Coherent  Affect:  N/A  (telehealth)  Mood:  Anxious, depressed, apprehensive  Thought process:  normal   Thought content:    WNL  Sensory/Perceptual disturbances:    WNL  Orientation:  oriented to person, place, time/date, situation, day of week, month of year and year  Attention:  Good  Concentration:  Good  Memory:  WNL  Fund of knowledge:   Good  Insight:    Good  Judgment:   Good  Impulse Control:  Good   Risk Assessment: Danger to Self:  No Self-injurious Behavior: No Danger to Others: No Duty to Warn:no Physical Aggression / Violence:No  Access to Firearms a concern: No  Gang Involvement:No   Subjective:   Patient reports symptoms noted above. Is having a rough day today as it is her oldest daughter's birthday and daughter has cut off relationship with patient for past 8 yrs over daughter [name]) feeling hurt by patient in earlier years due to her feeling unprotected but yet did not share info with patient. Patient still sends her birthday cards but doesn't get responses from daughter. Patient really concerned and hurt by this situation and processed her thoughts and feelings in session today. Sadness, grief, pain, hurt, regret, guilt "that maybe I didn't handle things best as her mother, and feeling rejected and "held back".  Also discussed strategies, including CBT (and the homework followup from last appt.,  mindfulness,  and self-care techniques that can help her with these concerns and feelings.   Has had some additional tinnitus issues since last appt but no more vertigo at this time.  Patient [facility] and she plans to discuss her situation with him and see what he suggests from this point.   (Reviewed with patient as she feels it's helpful.)  Stress and anxiety continue about the coronavirus and racial tensions and is better with using strategies to help manage her anxieties.  Also noticed that the anxiety may have heightened some as she and [name] are currently in a book study with racial issues being discussed (and this brought up  issues with one of her daughters who is [place] and very dark skin, and there's been issues within the family on that.)  Her faith remains a consolation and support to her as are her church members. Reviewed and reinforced good self-care (physical and mental), especially staying in touch with friends/family, eating healthy, maintaining some exercise (if able), and participating in activities including reading, books on tape, time with her dog, play online word game, and tv movies, while limiting the TV exposure of pandemic and violence news.  Getting outside of house on daily basis as able right now (except when raining) with husband and dog as part of her overall self-care.  Interventions: Cognitive Behavioral Therapy and Ego-Supportive  Diagnosis:   ICD-10-CM   1. Major depressive disorder, recurrent episode, mild ([place])  F33.0     Plan:    Patient to continue to follow through on strategies and recommendations reviewed in sessions to better manage anxiety and stress and depression,  to be more self accepting of her emotions and feelings without judging them, and practice reframing some of her thoughts to be more self-caring.  Next session within  2 wks.  Shanon Ace, LCSW

## 2018-09-15 DIAGNOSIS — G43019 Migraine without aura, intractable, without status migrainosus: Secondary | ICD-10-CM | POA: Diagnosis not present

## 2018-09-15 DIAGNOSIS — Z79899 Other long term (current) drug therapy: Secondary | ICD-10-CM | POA: Diagnosis not present

## 2018-09-15 DIAGNOSIS — Z049 Encounter for examination and observation for unspecified reason: Secondary | ICD-10-CM | POA: Diagnosis not present

## 2018-09-20 ENCOUNTER — Encounter: Payer: Self-pay | Admitting: Obstetrics and Gynecology

## 2018-09-20 DIAGNOSIS — Z1231 Encounter for screening mammogram for malignant neoplasm of breast: Secondary | ICD-10-CM | POA: Diagnosis not present

## 2018-09-22 ENCOUNTER — Other Ambulatory Visit: Payer: Self-pay | Admitting: Rheumatology

## 2018-09-26 ENCOUNTER — Other Ambulatory Visit: Payer: Self-pay

## 2018-09-26 ENCOUNTER — Ambulatory Visit (INDEPENDENT_AMBULATORY_CARE_PROVIDER_SITE_OTHER): Payer: Medicare Other | Admitting: Psychiatry

## 2018-09-26 DIAGNOSIS — F330 Major depressive disorder, recurrent, mild: Secondary | ICD-10-CM

## 2018-09-26 NOTE — Progress Notes (Signed)
[facility] Counselor/Therapist Progress Note  Patient ID: [name], MRN: [id number],    Date: 09/26/2018  Time Spent:  54  Minutes        11:00am to 12noon  Treatment Type:  Individual Therapy   Virtual Visit  Note I connected with patient by a video enabled telemedicine application or telephone, with their informed consent, and verified patient privacy and that I am speaking with the correct person using two identifiers. I am at [place] and patient is at home.   I discussed the limitations, risks, security and privacy concerns of performing psychotherapy and management service by telephone and the availability of in person appointments. I also discussed with the patient that there may be a patient responsible charge related to this service. The patient expressed understanding and agreed to proceed.  I discussed the treatment planning with the patient. The patient was provided an opportunity to ask questions and all were answered. The patient agreed with the plan and demonstrated an understanding of the instructions.   The patient was advised to call  our office if symptoms worsen or feel they are in a crisis state and need immediate contact.   Reported Symptoms:  anxious, depressed, some fears re: virus threat "but not quite as bad.".  Vertigo has subsided, and tinnitus has continued some "but not bad".  Any new medications since last appt:  New medication since last appt per Dr. Domingo Cocking at [facility].  Zonisamide 25mg  once daily for 2 wks, then take 2 of the 25mg  daily.   Any health changes:    No further episodes of vertigo since last appt.  Tinnitus has continued some at a lower level and not problematic. Following up from appt earlier about vertigo and possible vestibular migraines.  Is to have a pressure point injections this week at [facility] with Dr. Domingo Cocking.  Also to see PCP soon for her physical.  Mental Status  Exam:  Appearance:    N/A  (telehealth)  Behavior:  Sharing  Motor:  Normal  Speech/Language:   Clear and Coherent  Affect:  N/A  (telehealth)  Mood:  Anxious, depressed, apprehensive  Thought process:  normal  Thought content:    WNL  Sensory/Perceptual disturbances:    WNL  Orientation:  oriented to person, place, time/date, situation, day of week, month of year and year  Attention:  Good  Concentration:  Good  Memory:  WNL  Fund of knowledge:   Good  Insight:    Good  Judgment:   Good  Impulse Control:  Good   Risk Assessment: Danger to Self:  No Self-injurious Behavior: No Danger to Others: No Duty to Warn:no Physical Aggression / Violence:No  Access to Firearms a concern: No  Gang Involvement:No   Subjective:   Patient today reporting symptoms listed above. Reviewed a bit of her feelings discussed last session re: not contact with her older daughter which is hurtful for her.  Processed some more of her feelings today especially some guilt, sadness, and regrets, as well as strategies to help with these feelings, especially CBT and mindfulness.  Is to go this week for her first injections at the [facility].  Is hoping they will be helpful in providing her relief at least or her headaches and maybe the vertigo also.  Continues working on multiple issues impacting her depression and anxiety mentioned above with her oldest daughter as well as issues with her husband's health issues, the  ongoing coronavirus and susceptibility for her and her husband and other loved ones, and the racial tensions throughout the world.   (Reviewed with patient as she feels it's helpful.)  Stress and anxiety continue about the coronavirus and racial tensions and is better with using strategies to help manage her anxieties.  Also noticed that the anxiety may have heightened some as she and [name] are currently in a book study with racial issues being discussed (and this brought up issues  with one of her daughters who is [place] and very dark skin, and there's been issues within the family on that.)  Her faith remains a consolation and support to her as are her church members. Reviewed and reinforced good self-care (physical and mental), especially staying in touch with friends/family, eating healthy, maintaining some exercise (if able), and participating in activities including reading, books on tape, time with her dog, play online word game, and tv movies, while limiting the TV exposure of pandemic and violence news.  Getting outside of house on daily basis as able right now (except when raining) with husband and dog as part of her overall self-care.  Interventions: Cognitive Behavioral Therapy and Ego-Supportive  Diagnosis:   ICD-10-CM   1. Major depressive disorder, recurrent episode, mild ([place])  F33.0     Plan:    Patient to continue to follow through on strategies and recommendations reviewed in session to better manage anxiety and stress and depression,  to be more self accepting of her emotions and feelings without judging them, and practice reframing some of her thoughts to be more self-caring.  Next session within  2 wks.  Shanon Ace, LCSW

## 2018-09-28 DIAGNOSIS — G518 Other disorders of facial nerve: Secondary | ICD-10-CM | POA: Diagnosis not present

## 2018-09-28 DIAGNOSIS — M542 Cervicalgia: Secondary | ICD-10-CM | POA: Diagnosis not present

## 2018-09-28 DIAGNOSIS — M791 Myalgia, unspecified site: Secondary | ICD-10-CM | POA: Diagnosis not present

## 2018-09-28 DIAGNOSIS — G43019 Migraine without aura, intractable, without status migrainosus: Secondary | ICD-10-CM | POA: Diagnosis not present

## 2018-10-04 ENCOUNTER — Other Ambulatory Visit: Payer: Self-pay | Admitting: Psychiatry

## 2018-10-04 DIAGNOSIS — F411 Generalized anxiety disorder: Secondary | ICD-10-CM

## 2018-10-04 DIAGNOSIS — F5101 Primary insomnia: Secondary | ICD-10-CM

## 2018-10-05 DIAGNOSIS — E038 Other specified hypothyroidism: Secondary | ICD-10-CM | POA: Diagnosis not present

## 2018-10-05 DIAGNOSIS — D539 Nutritional anemia, unspecified: Secondary | ICD-10-CM | POA: Diagnosis not present

## 2018-10-05 DIAGNOSIS — E7849 Other hyperlipidemia: Secondary | ICD-10-CM | POA: Diagnosis not present

## 2018-10-05 DIAGNOSIS — M810 Age-related osteoporosis without current pathological fracture: Secondary | ICD-10-CM | POA: Diagnosis not present

## 2018-10-05 NOTE — Telephone Encounter (Signed)
Her rx's on file expired, last fill 08/25/2018 Has appt back in [place]

## 2018-10-07 ENCOUNTER — Other Ambulatory Visit: Payer: Self-pay | Admitting: Rheumatology

## 2018-10-10 ENCOUNTER — Ambulatory Visit (INDEPENDENT_AMBULATORY_CARE_PROVIDER_SITE_OTHER): Payer: Medicare Other | Admitting: Psychiatry

## 2018-10-10 ENCOUNTER — Other Ambulatory Visit: Payer: Self-pay

## 2018-10-10 DIAGNOSIS — F330 Major depressive disorder, recurrent, mild: Secondary | ICD-10-CM

## 2018-10-10 NOTE — Progress Notes (Signed)
[facility] Counselor/Therapist Progress Note  Patient ID: [name], MRN: [id number],    Date: 10/10/2018  Time Spent:  69  Minutes        11:00am to 12noon  Treatment Type:  Individual Therapy   Virtual Visit  Note I connected with patient by a video enabled telemedicine application or telephone, with their informed consent, and verified patient privacy and that I am speaking with the correct person using two identifiers. I am at [place] and patient is at home.   I discussed the limitations, risks, security and privacy concerns of performing psychotherapy and management service by telephone and the availability of in person appointments. I also discussed with the patient that there may be a patient responsible charge related to this service. The patient expressed understanding and agreed to proceed.  I discussed the treatment planning with the patient. The patient was provided an opportunity to ask questions and all were answered. The patient agreed with the plan and demonstrated an understanding of the instructions.   The patient was advised to call  our office if symptoms worsen or feel they are in a crisis state and need immediate contact.   Reported Symptoms:  anxious, depressed, some fears re: virus threat "but not quite as bad.".  Vertigo has not been a problem since last appt,  and tinnitus has lessened. Sadness (husband's health issues, daughter's long term issues, worsening of the pandemic). Feels like her depression is aggravated by the pandemic due to the social isolation and limited people contact.  Any new medications since last appt:   Patient reports she [facility] still [facility] Not sure yet of overall benefit but still continuing at this point.  New medication since last appt per Dr. Domingo Cocking at [place].  Zonisamide 25mg  once daily for 2 wks, then take 2 of the 25mg  daily.   Any health changes:     Has not had vertigo episode since last appt.  Also Tinnitus has bothered her less since last appt.  Mental Status Exam:  Appearance:    N/A  (telehealth)  Behavior:  Sharing  Motor:  Normal  Speech/Language:   Clear and Coherent  Affect:  N/A  (telehealth)  Mood:  Anxious, depressed, apprehensive  Thought process:  normal  Thought content:    WNL  Sensory/Perceptual disturbances:    WNL  Orientation:  oriented to person, place, time/date, situation, day of week, month of year and year  Attention:  Good  Concentration:  Good  Memory:  WNL  Fund of knowledge:   Good  Insight:    Good  Judgment:   Good  Impulse Control:  Good   Risk Assessment: Danger to Self:  No Self-injurious Behavior: No Danger to Others: No Duty to Warn:no Physical Aggression / Violence:No  Access to Firearms a concern: No  Gang Involvement:No   Subjective:   Patient today reporting symptoms listed above and is really struggling with the effects of the pandemic and the limitation of not seeing people. "I miss being able to hug people."  Processed how she is affected in multiple ways and what meaning this has for her.  Is missing her church family as they are still not meeting in person, but are offering some meetings via [place].    States that she thought more recently about her oldest daughter and the issues between the two of them that have been so hurtful.  Daughter  has still not responded to patient's text message sent recently around her birthday.  Ongoing process of working through this for patient and she actually has begun to be able to talk about that relationship in more detail, especially some sadness, guilt, and regrets. Helps to talk openly, and CBT and mindfulness strategies also have helped some, and helped her at times to do some "letting go".   To go this week for her 2nd injection at the [facility], and feels the first one did help some a couple wks ago.  (Reviewed with patient  as she feels it's helpful.)  Stress and anxiety continue about the coronavirus and racial tensions and is better with using strategies to help manage her anxieties.  Also noticed that the anxiety may have heightened some as she and [name] are currently in a book study with racial issues being discussed (and this brought up issues with one of her daughters who is [place] and very dark skin, and there's been issues within the family on that.)  Her faith remains a consolation and support to her as are her church members. Reviewed and reinforced good self-care (physical and mental), especially staying in touch with friends/family, eating healthy, maintaining some exercise (if able), and participating in activities including reading, books on tape, time with her dog, play online word game, and tv movies, while limiting the TV exposure of pandemic and violence news.  Getting outside of house on daily basis as able right now (except when raining) with husband and dog as part of her overall self-care.  Interventions: Cognitive Behavioral Therapy and Ego-Supportive  Diagnosis:   ICD-10-CM   1. Major depressive disorder, recurrent episode, mild ([place])  F33.0     Plan:    Patient to continue to follow through on strategies and recommendations reviewed in session to better manage anxiety and stress and depression,  to be more self accepting of her emotions and feelings without judging them, and practice reframing some of her thoughts to be more self-caring.  Next session within  2 wks.  Shanon Ace, LCSW

## 2018-10-11 DIAGNOSIS — M791 Myalgia, unspecified site: Secondary | ICD-10-CM | POA: Diagnosis not present

## 2018-10-11 DIAGNOSIS — M542 Cervicalgia: Secondary | ICD-10-CM | POA: Diagnosis not present

## 2018-10-11 DIAGNOSIS — G518 Other disorders of facial nerve: Secondary | ICD-10-CM | POA: Diagnosis not present

## 2018-10-11 DIAGNOSIS — G43019 Migraine without aura, intractable, without status migrainosus: Secondary | ICD-10-CM | POA: Diagnosis not present

## 2018-10-12 DIAGNOSIS — F329 Major depressive disorder, single episode, unspecified: Secondary | ICD-10-CM | POA: Diagnosis not present

## 2018-10-12 DIAGNOSIS — G43009 Migraine without aura, not intractable, without status migrainosus: Secondary | ICD-10-CM | POA: Diagnosis not present

## 2018-10-12 DIAGNOSIS — R002 Palpitations: Secondary | ICD-10-CM | POA: Diagnosis not present

## 2018-10-12 DIAGNOSIS — R42 Dizziness and giddiness: Secondary | ICD-10-CM | POA: Diagnosis not present

## 2018-10-12 DIAGNOSIS — M810 Age-related osteoporosis without current pathological fracture: Secondary | ICD-10-CM | POA: Diagnosis not present

## 2018-10-12 DIAGNOSIS — R202 Paresthesia of skin: Secondary | ICD-10-CM | POA: Diagnosis not present

## 2018-10-12 DIAGNOSIS — Z0000 Encounter for general adult medical examination without abnormal findings: Secondary | ICD-10-CM | POA: Diagnosis not present

## 2018-10-12 DIAGNOSIS — Z1331 Encounter for screening for depression: Secondary | ICD-10-CM | POA: Diagnosis not present

## 2018-10-12 DIAGNOSIS — M797 Fibromyalgia: Secondary | ICD-10-CM | POA: Diagnosis not present

## 2018-10-12 DIAGNOSIS — E039 Hypothyroidism, unspecified: Secondary | ICD-10-CM | POA: Diagnosis not present

## 2018-10-12 DIAGNOSIS — K219 Gastro-esophageal reflux disease without esophagitis: Secondary | ICD-10-CM | POA: Diagnosis not present

## 2018-10-12 DIAGNOSIS — E785 Hyperlipidemia, unspecified: Secondary | ICD-10-CM | POA: Diagnosis not present

## 2018-10-13 DIAGNOSIS — Z23 Encounter for immunization: Secondary | ICD-10-CM | POA: Diagnosis not present

## 2018-10-20 ENCOUNTER — Other Ambulatory Visit (HOSPITAL_COMMUNITY): Payer: Self-pay

## 2018-10-21 ENCOUNTER — Ambulatory Visit (HOSPITAL_COMMUNITY)
Admission: RE | Admit: 2018-10-21 | Discharge: 2018-10-21 | Disposition: A | Discharge status: Home / Self Care | Payer: Medicare Other | Source: Ambulatory Visit | Attending: Internal Medicine | Admitting: Internal Medicine

## 2018-10-21 ENCOUNTER — Other Ambulatory Visit: Payer: Self-pay

## 2018-10-21 DIAGNOSIS — M810 Age-related osteoporosis without current pathological fracture: Secondary | ICD-10-CM | POA: Insufficient documentation

## 2018-10-21 MED ORDER — DENOSUMAB 60 MG/ML ~~LOC~~ SOSY
60.0000 mg | PREFILLED_SYRINGE | Freq: Once | SUBCUTANEOUS | Status: AC
  Administered 2018-10-21: 14:00:00 60 mg via SUBCUTANEOUS

## 2018-10-21 MED ORDER — DENOSUMAB 60 MG/ML ~~LOC~~ SOSY
PREFILLED_SYRINGE | SUBCUTANEOUS | Status: AC
  Filled 2018-10-21: qty 1

## 2018-10-24 ENCOUNTER — Other Ambulatory Visit: Payer: Self-pay

## 2018-10-24 ENCOUNTER — Other Ambulatory Visit: Payer: Self-pay | Admitting: Rheumatology

## 2018-10-24 ENCOUNTER — Ambulatory Visit (INDEPENDENT_AMBULATORY_CARE_PROVIDER_SITE_OTHER): Payer: Medicare Other | Admitting: Psychiatry

## 2018-10-24 DIAGNOSIS — F330 Major depressive disorder, recurrent, mild: Secondary | ICD-10-CM

## 2018-10-24 NOTE — Progress Notes (Signed)
[facility] Counselor/Therapist Progress Note  Patient ID: [name], MRN: [id number],    Date: 10/24/2018  Time Spent:  65  Minutes        11:00am to 12noon  Treatment Type:  Individual Therapy   Virtual Visit  Note I connected with patient by a video enabled telemedicine with their informed consent, and verified patient privacy and that I am speaking with the correct person using two identifiers. Since we had video connection, I could readily identify patient as the person with whom I was speaking.  I am at [place] and patient is at home.   I discussed the limitations, risks, security and privacy concerns of performing psychotherapy and management service by telephone and the availability of in person appointments. I also discussed with the patient that there may be a patient responsible charge related to this service. The patient expressed understanding and agreed to proceed.  I discussed the treatment planning with the patient. The patient was provided an opportunity to ask questions and all were answered. The patient agreed with the plan and demonstrated an understanding of the instructions.   The patient was advised to call  our office if symptoms worsen or feel they are in a crisis state and need immediate contact.  Any new medications or new health concerns since last appt:    None reported.   Reported Symptoms:  anxious, depressed, some fears re: virus threat "but not quite as bad.". Sadness (husband's long-term health issues, oldest daughter's long term emotional issues, worsening of the pandemic). Feels like her depression is aggravated by the pandemic due to the social isolation and limited people contact. Hurt and sadness re: recent situation with oldest daughter (who is now pregnant again)  Mental Status Exam:  Appearance:    N/A  (telehealth)  Behavior:  Sharing  Motor:  Normal  Speech/Language:   Clear and Coherent  Affect:  N/A  (telehealth)   Mood:  Anxious, depressed  Thought process:  normal  Thought content:    WNL  Sensory/Perceptual disturbances:    WNL  Orientation:  oriented to person, place, time/date, situation, day of week, month of year and year  Attention:  Good  Concentration:  Good  Memory:  WNL  Fund of knowledge:   Good  Insight:    Good  Judgment:   Good  Impulse Control:  Good   Risk Assessment: Danger to Self:  No Self-injurious Behavior: No Danger to Others: No Duty to Warn:no Physical Aggression / Violence:No  Access to Firearms a concern: No  Gang Involvement:No   Subjective:   Patient today reporting symptoms listed above and has been especially upset th past couple weeks over an issue/incident between her oldest daughter and patient (who does not have contact with patient). Situation has been very tense, hurtful, confusing, and sad for patient. * (Not all details in note due to patient privacy needs.) Quite emotional on the front end of session as she shared details and her concerns. Patient needing to process this at length as it brought up some painful history for her.  Lots of pain over a period of years where relationship has at times been tense and at other times where patient has experienced the pain of contact being withheld.  Patient tearful at some points and does some good work in talking through things and understanding "what is in her control and and what is not."   Still has some sadness, guilt, and regrets although is working towards  resolution of the "things I can control and change." Patient shares that is helps to talk openly, and CBT and mindfulness strategies also have helped some, and helped her at times to do some "letting go".    (Reviewed with patient as she feels it's helpful.)  Stress and anxiety continue about the coronavirus (especially due to her and her husband being more susceptible to the virus) and racial tensions and is better with using strategies to help manage her  anxieties.  Her faith remains a consolation and support to her as are her church members. Reviewed and reinforced good self-care (physical and mental), especially staying in touch with friends/family, eating healthy, maintaining some exercise (if able), and participating in activities including reading, books on tape, time with her dog, play online word game, and tv movies, while limiting the TV exposure of pandemic and violence news.  Getting outside of house on daily basis as able right now (except when raining) with husband and dog as part of her overall self-care.  Interventions: Cognitive Behavioral Therapy and Ego-Supportive  Diagnosis:   ICD-10-CM   1. Major depressive disorder, recurrent episode, mild ([name])  F33.0     Plan:    Patient to continue to follow through on strategies and recommendations reviewed in session to better manage anxiety and stress and depression,  to be more self accepting of her emotions and feelings without judging them, and practice reframing some of her thoughts to be more self-caring.  Goal review per [facility] an progress noted with patient. Next session within  2 wks.  Shanon Ace, LCSW

## 2018-10-26 ENCOUNTER — Telehealth: Payer: Self-pay | Admitting: Psychiatry

## 2018-10-26 NOTE — Telephone Encounter (Signed)
Left detailed message on personal voicemail of instructions and to call back with any concerns.

## 2018-10-26 NOTE — Telephone Encounter (Signed)
[name] called about her metformin.  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  She would like to come off this med.  Please call to tell her how to wean off.  Next appt 01/13/19.

## 2018-10-27 DIAGNOSIS — R1013 Epigastric pain: Secondary | ICD-10-CM | POA: Diagnosis not present

## 2018-10-27 DIAGNOSIS — K5900 Constipation, unspecified: Secondary | ICD-10-CM | POA: Diagnosis not present

## 2018-10-27 DIAGNOSIS — K219 Gastro-esophageal reflux disease without esophagitis: Secondary | ICD-10-CM | POA: Diagnosis not present

## 2018-10-27 DIAGNOSIS — Z86010 Personal history of colonic polyps: Secondary | ICD-10-CM | POA: Diagnosis not present

## 2018-10-28 DIAGNOSIS — G43019 Migraine without aura, intractable, without status migrainosus: Secondary | ICD-10-CM | POA: Diagnosis not present

## 2018-10-28 DIAGNOSIS — G518 Other disorders of facial nerve: Secondary | ICD-10-CM | POA: Diagnosis not present

## 2018-10-28 DIAGNOSIS — M542 Cervicalgia: Secondary | ICD-10-CM | POA: Diagnosis not present

## 2018-10-28 DIAGNOSIS — M791 Myalgia, unspecified site: Secondary | ICD-10-CM | POA: Diagnosis not present

## 2018-11-09 ENCOUNTER — Ambulatory Visit (INDEPENDENT_AMBULATORY_CARE_PROVIDER_SITE_OTHER): Payer: Medicare Other | Admitting: Psychiatry

## 2018-11-09 ENCOUNTER — Other Ambulatory Visit: Payer: Self-pay

## 2018-11-09 DIAGNOSIS — F330 Major depressive disorder, recurrent, mild: Secondary | ICD-10-CM

## 2018-11-09 NOTE — Progress Notes (Signed)
[facility] Counselor/Therapist Progress Note  Patient ID: [name], MRN: [id number],    Date: 11/09/2018  Time Spent:  60 minutes       11:00am to 12:00noon  Treatment Type:  Individual Therapy  Any new medications or new health concerns since last appt:    Patient reports that she has been taken off Metformin. Is to have an endoscopy this Friday due to recurrent pain in "upper abdomen area where ribs come together"  Reported Symptoms:  Anxiety, depression (decreased some), sadness re: husband's ongoing health issues and the conflict/alienation with oldest daughter, fears and concerns about "the virus and violence/tension in the world" and "I feel like it's not getting better"   Mental Status Exam:  Appearance:    neatly dressed  Behavior:  sharing  Motor:  normal  Speech/Language:   Clear and coherent  Affect:  anxious  Mood:  Anxious, depressed (decreased some recently)  Thought process:  WNL  Thought content:    WNL  Sensory/Perceptual disturbances:    WNL  Orientation:  Oriented to   Attention:  Good  Concentration:  Good  Memory:  WNL  Fund of knowledge:   Good  Insight:    Good  Judgment:   Good  Impulse Control:  Good   Risk Assessment: Danger to Self:  no  Self-injurious Behavior: no Danger to Others: no Duty to Warn: no Physical Aggression / Violence: no Access to Firearms a concern: no Gang Involvement: no  Subjective:   Patient anxious, stressed, concerned about husband's health, alienation with adult daughter, and ongoing world crises and tensions.  Very concerned and supportive with the BLM movement. Some sadness, tearfulness, and guilt. Does remind herself of "what I can change and what I can't".  Does try to use thought changing strategies based on CBT taught in earlier sessions. Knows that "I need to let go of some things", but is finding it difficult, and continues to work at it, making some progress.    (Reviewed on 11/09/18 with patient as  she feels it's helpful.)  Stress and anxiety continue about the coronavirus (especially due to her and her husband being more susceptible to the virus) and racial tensions and is better with using strategies to help manage her anxieties.  Her faith remains a consolation and support to her as are her church members. Reviewed and reinforced good self-care (physical and mental), especially staying in touch with friends/family, eating healthy, maintaining some exercise (if able), and participating in activities including reading, books on tape, time with her dog, play online word game, and tv movies, while limiting the TV exposure of pandemic and violence news.  Getting outside of house on daily basis as able right now (except when raining) with husband and dog as part of her overall self-care.  Interventions: Cognitive Behavioral  Diagnosis:   ICD-10-CM   1. Major depressive disorder, recurrent episode, mild ([place])  F33.0     Plan:   Treatment goals: Patient not signing goal updates on computer screen due to [place].  Long term goal: Develop healthy cognitive patterns and beliefs about self and the world that lead to alleviation of depression and anxiety, and help prevent relapse of depression and anxiety.  Progressing- Patient motivated and is working on changing her long-time thought patterns of worrying excessive.  Is having some success and working to better understand how the worry feeds her depression and anxiety.  Short term goal: Learn and implement personal skills for managing stress, solving daily  problems, and resolving conflicts effectively.  Strategy: Use modeling, role-playing, behavioral rehearsal, and constructive feedback for patient in trying to develop more skills in managing stress, conflict, and daily problems.   Progressing- Patient motivated and is working to "let go" of her older,  ineffective and anxiety-producing ways of handling stress, conflict, and daily problems,  while working to Network engineer and acquire skills that support personal empowerment and  better management of daily stressors.     Goal review and progress noted with patient. Next session within  2 weeks.   Shanon Ace, LCSW

## 2018-11-10 DIAGNOSIS — Z471 Aftercare following joint replacement surgery: Secondary | ICD-10-CM | POA: Diagnosis not present

## 2018-11-10 DIAGNOSIS — M7061 Trochanteric bursitis, right hip: Secondary | ICD-10-CM | POA: Diagnosis not present

## 2018-11-10 DIAGNOSIS — M7062 Trochanteric bursitis, left hip: Secondary | ICD-10-CM | POA: Diagnosis not present

## 2018-11-10 DIAGNOSIS — Z96651 Presence of right artificial knee joint: Secondary | ICD-10-CM | POA: Diagnosis not present

## 2018-11-10 DIAGNOSIS — M25562 Pain in left knee: Secondary | ICD-10-CM | POA: Diagnosis not present

## 2018-11-11 ENCOUNTER — Encounter: Payer: Self-pay | Admitting: Cardiovascular Disease

## 2018-11-11 DIAGNOSIS — K2970 Gastritis, unspecified, without bleeding: Secondary | ICD-10-CM | POA: Diagnosis not present

## 2018-11-11 DIAGNOSIS — K317 Polyp of stomach and duodenum: Secondary | ICD-10-CM | POA: Diagnosis not present

## 2018-11-11 DIAGNOSIS — R1013 Epigastric pain: Secondary | ICD-10-CM | POA: Diagnosis not present

## 2018-11-11 DIAGNOSIS — K219 Gastro-esophageal reflux disease without esophagitis: Secondary | ICD-10-CM | POA: Diagnosis not present

## 2018-11-14 DIAGNOSIS — G518 Other disorders of facial nerve: Secondary | ICD-10-CM | POA: Diagnosis not present

## 2018-11-14 DIAGNOSIS — M542 Cervicalgia: Secondary | ICD-10-CM | POA: Diagnosis not present

## 2018-11-14 DIAGNOSIS — G43019 Migraine without aura, intractable, without status migrainosus: Secondary | ICD-10-CM | POA: Diagnosis not present

## 2018-11-14 DIAGNOSIS — M791 Myalgia, unspecified site: Secondary | ICD-10-CM | POA: Diagnosis not present

## 2018-11-15 ENCOUNTER — Other Ambulatory Visit: Payer: Self-pay | Admitting: Gastroenterology

## 2018-11-15 DIAGNOSIS — R109 Unspecified abdominal pain: Secondary | ICD-10-CM

## 2018-11-21 ENCOUNTER — Ambulatory Visit (INDEPENDENT_AMBULATORY_CARE_PROVIDER_SITE_OTHER): Payer: Medicare Other | Admitting: Psychiatry

## 2018-11-21 ENCOUNTER — Other Ambulatory Visit: Payer: Self-pay

## 2018-11-21 DIAGNOSIS — F330 Major depressive disorder, recurrent, mild: Secondary | ICD-10-CM

## 2018-11-21 NOTE — Progress Notes (Signed)
[facility] Counselor/Therapist Progress Note  Patient ID: [name], MRN: [id number],    Date: 11/21/2018  Time Spent:  60 minutes     11:00am to 12:00pm  Treatment Type: Individual Therapy  Reported Symptoms:  Anxiety, stressed, depressed ,  "it's been a bad couple weeks" as I had a recent endoscopy and am being sent for further testing with CAT scan tomorrow. Sadness as she just learned her minister is going to be leaving the end of October. Neighbors also returning to [place].  Mental Status Exam:  Appearance:   Casual     Behavior:  Appropriate and Sharing  Motor:  Normal  Speech/Language:   Clear and Coherent  Affect:  Tearful and anxious  Mood:  anxious and sad  Thought process:  normal  Thought content:    WNL  Sensory/Perceptual disturbances:    WNL  Orientation:  oriented to person, place, time/date, situation, day of week, month of year and year  Attention:  Good  Concentration:  Good  Memory:  WNL  Fund of knowledge:   Good  Insight:    Good  Judgment:   Good  Impulse Control:  Good   Risk Assessment: Danger to Self:  No Self-injurious Behavior: No Danger to Others: No Duty to Warn:no Physical Aggression / Violence:No  Access to Firearms a concern: No  Gang Involvement:No   Subjective:  Patient in today with anxiety, stress, depression, and some tearfulness and grief (about her minister leaving and a close neighbor returning to [place]).  Also had an endoscopy recently where 2 polyps were removed and sent off for evaluation, but now doctor is sending her to have a CAT scan done tomorrow.  Really sad about her minister leaving at end of October and was tearful about it as she spoke.  Also sad that a neighbor is returning to [place]. Sadness about the "whole pandemic situation".   Interventions: Cognitive Behavioral Therapy and Solution-Oriented/Positive Psychology  Diagnosis:   ICD-10-CM   1. Major depressive disorder, recurrent episode, mild ([place])   F33.0     Plan:   Patient not signing tx plan updates on computer screen due to [place].  Treatment goals: Goals may remain on tx plan as patient works on strategies to meet her goals.   Long term goal: Develop healthy cognitive patterns and beliefs about self and the world that lead to alleviation of depression and anxiety, and help prevent relapse of depression and anxiety.  Progress: Patient continues to be motivated.  Working specifically to apply this long term goal due to some very recent stressors involving grief and loss in losing her minister who is leaving their church and a set of neighbors who are returning to [place]. Working to keep her thought patterns more leveled out to include some positives versus them all being negative due to some upcoming changes in her life. Her tendency has been to worry excessively so this is difficulty work for her.  Is realizing some more how worrying supports depression.  Short term goal: Learn and implement personal skills for managing stress, solving daily problems, and resolving conflicts effectively.  Strategy: Use modeling, role-playing, behavioral rehearsal, and constructive feedback for patient in trying to develop more skills in managing stress, conflict, and daily problems.   Progressing-  Patient continues to work to use some calming and relaxation skills to better manage stress and anxiety.  Working harder to believe in herself and not make assumptions that things will go badly, and  instead know that she can change some long time behaviors of "worrying and looking for what goes wrong versus right." Trying to think in ways that develop more personal empowerment---examples given in session today.    Next session within 2 weeks.   Shanon Ace, LCSW

## 2018-11-23 ENCOUNTER — Other Ambulatory Visit: Payer: Self-pay

## 2018-11-23 ENCOUNTER — Ambulatory Visit
Admission: RE | Admit: 2018-11-23 | Discharge: 2018-11-23 | Disposition: A | Discharge status: Home / Self Care | Payer: Medicare Other | Source: Ambulatory Visit | Attending: Gastroenterology | Admitting: Gastroenterology

## 2018-11-23 DIAGNOSIS — R109 Unspecified abdominal pain: Secondary | ICD-10-CM

## 2018-11-23 MED ORDER — IOPAMIDOL (ISOVUE-300) INJECTION 61%
100.0000 mL | Freq: Once | INTRAVENOUS | Status: AC | PRN
  Administered 2018-11-23: 10:00:00 100 mL via INTRAVENOUS

## 2018-11-29 DIAGNOSIS — Z86010 Personal history of colonic polyps: Secondary | ICD-10-CM | POA: Diagnosis not present

## 2018-11-29 DIAGNOSIS — M542 Cervicalgia: Secondary | ICD-10-CM

## 2018-11-29 DIAGNOSIS — R151 Fecal smearing: Secondary | ICD-10-CM | POA: Diagnosis not present

## 2018-11-29 DIAGNOSIS — R079 Chest pain, unspecified: Secondary | ICD-10-CM

## 2018-11-29 DIAGNOSIS — K219 Gastro-esophageal reflux disease without esophagitis: Secondary | ICD-10-CM | POA: Diagnosis not present

## 2018-11-29 DIAGNOSIS — Z800 Family history of malignant neoplasm of digestive organs: Secondary | ICD-10-CM | POA: Diagnosis not present

## 2018-11-30 DIAGNOSIS — G518 Other disorders of facial nerve: Secondary | ICD-10-CM | POA: Diagnosis not present

## 2018-11-30 DIAGNOSIS — R030 Elevated blood-pressure reading, without diagnosis of hypertension: Secondary | ICD-10-CM | POA: Diagnosis not present

## 2018-11-30 DIAGNOSIS — M791 Myalgia, unspecified site: Secondary | ICD-10-CM | POA: Diagnosis not present

## 2018-11-30 DIAGNOSIS — M5030 Other cervical disc degeneration, unspecified cervical region: Secondary | ICD-10-CM | POA: Diagnosis not present

## 2018-11-30 DIAGNOSIS — G43019 Migraine without aura, intractable, without status migrainosus: Secondary | ICD-10-CM | POA: Diagnosis not present

## 2018-11-30 DIAGNOSIS — M542 Cervicalgia: Secondary | ICD-10-CM | POA: Diagnosis not present

## 2018-11-30 DIAGNOSIS — M47812 Spondylosis without myelopathy or radiculopathy, cervical region: Secondary | ICD-10-CM | POA: Diagnosis not present

## 2018-11-30 NOTE — Telephone Encounter (Signed)
Called patient and reviewed Dr. Elmarie Shiley recommendations for lexiscan.  She agrees to test. lexiscan ordered for scheduling. Reviewed stress test instructions. She understands instruction letter will be released to MyChart once test is scheduled. She was grateful for assistance.

## 2018-12-01 ENCOUNTER — Encounter: Payer: Self-pay | Admitting: Nurse Practitioner

## 2018-12-02 ENCOUNTER — Telehealth: Payer: Self-pay | Admitting: Cardiovascular Disease

## 2018-12-02 NOTE — Telephone Encounter (Signed)
Spoke with patient and advised her that Dr. Acie Fredrickson is agreeable with stress test being planned for next week. I advised that if her symptoms worsen over the weekend that she can reach the provider on-call by calling the office number or proceed to [facility] for treatment. I advised that Dr. Acie Fredrickson will be one of the MDs working this weekend. She verbalized understanding and thanked me for the call.

## 2018-12-02 NOTE — Telephone Encounter (Signed)
New message   Per after hours message dated 11/29/18, the patient states that she missed a call from the MD and wants to know if the MD thinks that she should come in earlier than her scheduled appt. Please advise.

## 2018-12-05 ENCOUNTER — Telehealth (HOSPITAL_COMMUNITY): Payer: Self-pay | Admitting: *Deleted

## 2018-12-05 ENCOUNTER — Ambulatory Visit (INDEPENDENT_AMBULATORY_CARE_PROVIDER_SITE_OTHER): Payer: Medicare Other | Admitting: Psychiatry

## 2018-12-05 ENCOUNTER — Other Ambulatory Visit: Payer: Self-pay

## 2018-12-05 DIAGNOSIS — F330 Major depressive disorder, recurrent, mild: Secondary | ICD-10-CM

## 2018-12-05 NOTE — Progress Notes (Signed)
[facility] Counselor/Therapist Progress Note  Patient ID: [name], MRN: [id number],    Date: 12/05/2018  Time Spent:  60 minutes    11:00am to 12:00noon  Treatment Type: Individual Therapy  Reported Symptoms: depressed, anxiety, concerns with physical health issues  Mental Status Exam:  Appearance:   Neat     Behavior:  Appropriate and Sharing  Motor:  Normal  Speech/Language:   Normal Rate  Affect:  Depressed and anxious  Mood:  anxious and depressed  Thought process:  goal directed  Thought content:    WNL  Sensory/Perceptual disturbances:    WNL  Orientation:  oriented to person, place, time/date, situation, day of week, month of year and year  Attention:  Good  Concentration:  Good  Memory:  WNL  Fund of knowledge:   Good  Insight:    Good  Judgment:   Good  Impulse Control:  Good   Risk Assessment: Danger to Self:  No Self-injurious Behavior: No Danger to Others: No Duty to Warn:no Physical Aggression / Violence:No  Access to Firearms a concern: No  Gang Involvement:No   Subjective:  Patient in today with depression and anxiety, "have been up and down" (not drastic mood swings) but depressed, anxious, sad, husband's Parkinson's disease, bothered by all the conflict and anxieties in the world today, and the social isolation of pandemic, and some health concerns she is dealing with right now.  Interventions: Cognitive Behavioral Therapy and Ego-Supportive  Diagnosis:   ICD-10-CM   1. 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  Treatment goals: Goals may remain on tx plan as patient works on strategies to meet her goals. Progress is noted every session in the "Progress" section of Plan.  Long term goal: Develop healthy cognitive patterns and beliefs about self and the world that lead to alleviation of depression and anxiety, and help  prevent relapse of depression and anxiety.  Short term goal: Learn and implement personal skills for managing stress, solving daily problems, and resolving conflicts effectively.  Strategies: Use modeling, role-playing, behavioral rehearsal, and constructive feedback for patient in trying to develop more skills in managing stress, conflict, and daily problems.Aslo recognize negative thought patterns that create depressive feelings and depressive actions, interrupt them and replace with more positive reality-based thoughts that do not support depression.  Progress for Long Term Goal: Patient discussed her work on goals since last session. She continues to apply her goal-directed behaviors towards thoughts about situations very real in her life right now, especially some depressive thoughts she is continuing to have re: her minister leaving their church and also their close neighbors who are returning to [place].  Processed her sadness as being normal, but was able to see how her depressive thought pattern was leading her to have a darker view of the situations.  We talked about how she can continue to work on both of these losses and heal from them, and actually maintain at least online contact with her neighbors that are moving. Due to her closeness to both of these situations and the people involved, she accepts that is a "process" and not an "event".  Patient motivated and doing some good work.  Progress for Short Term goal: Patient is working to better manage stress, anxiety, and uncertainty by using relaxation/breathing strategies, positive affirmations, and exercise such as walking outside.  Is  also working to develop more positive self-talk, self-confidence, which se worked on today.  She is working to decrease her worrying and negative assumptions that trip her up.  Talked about examples in session again today.  Attitude is good and she is motivated.   Next appt within 2-3 wks.   Shanon Ace, LCSW

## 2018-12-05 NOTE — Telephone Encounter (Signed)
Left message on voicemail per DPR in reference to upcoming appointment scheduled on 12/07/18 at 8:15 with detailed instructions given per Myocardial Perfusion Study Information Sheet for the test. LM to arrive 15 minutes early, and that it is imperative to arrive on time for appointment to keep from having the test rescheduled. If you need to cancel or reschedule your appointment, please call the office within 24 hours of your appointment. Failure to do so may result in a cancellation of your appointment, and a $50 no show fee. Phone number given for call back for any questions.

## 2018-12-07 ENCOUNTER — Ambulatory Visit (HOSPITAL_COMMUNITY): Payer: Medicare Other | Attending: Cardiology

## 2018-12-07 ENCOUNTER — Other Ambulatory Visit: Payer: Self-pay

## 2018-12-07 DIAGNOSIS — M542 Cervicalgia: Secondary | ICD-10-CM | POA: Diagnosis not present

## 2018-12-07 DIAGNOSIS — R079 Chest pain, unspecified: Secondary | ICD-10-CM | POA: Diagnosis not present

## 2018-12-07 LAB — MYOCARDIAL PERFUSION IMAGING
LV dias vol: 55 mL (ref 46–106)
LV sys vol: 12 mL
Peak HR: 92 {beats}/min
Rest HR: 70 {beats}/min
SDS: 0
SRS: 0
SSS: 0
TID: 0.98

## 2018-12-07 MED ORDER — TECHNETIUM TC 99M TETROFOSMIN IV KIT
31.3000 | PACK | Freq: Once | INTRAVENOUS | Status: AC | PRN
  Administered 2018-12-07: 31.3 via INTRAVENOUS
  Filled 2018-12-07: qty 32

## 2018-12-07 MED ORDER — TECHNETIUM TC 99M TETROFOSMIN IV KIT
11.0000 | PACK | Freq: Once | INTRAVENOUS | Status: AC | PRN
  Administered 2018-12-07: 11 via INTRAVENOUS
  Filled 2018-12-07: qty 11

## 2018-12-07 MED ORDER — REGADENOSON 0.4 MG/5ML IV SOLN
0.4000 mg | Freq: Once | INTRAVENOUS | Status: AC
  Administered 2018-12-07: 0.4 mg via INTRAVENOUS
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
## 2018-12-09 ENCOUNTER — Ambulatory Visit (INDEPENDENT_AMBULATORY_CARE_PROVIDER_SITE_OTHER): Payer: Medicare Other | Admitting: Cardiovascular Disease

## 2018-12-09 ENCOUNTER — Other Ambulatory Visit: Payer: Self-pay

## 2018-12-09 ENCOUNTER — Encounter: Payer: Self-pay | Admitting: Cardiovascular Disease

## 2018-12-09 VITALS — BP 100/70 | HR 79 | Ht 65.0 in | Wt 146.2 lb

## 2018-12-09 DIAGNOSIS — E785 Hyperlipidemia, unspecified: Secondary | ICD-10-CM

## 2018-12-09 DIAGNOSIS — R079 Chest pain, unspecified: Secondary | ICD-10-CM | POA: Diagnosis not present

## 2018-12-09 DIAGNOSIS — I493 Ventricular premature depolarization: Secondary | ICD-10-CM

## 2018-12-09 DIAGNOSIS — I200 Unstable angina: Secondary | ICD-10-CM | POA: Diagnosis not present

## 2018-12-09 MED ORDER — METOPROLOL TARTRATE 50 MG PO TABS: ORAL_TABLET | ORAL | 0 refills | Status: DC

## 2018-12-09 MED ORDER — NITROGLYCERIN 0.4 MG SL SUBL: 0.4000 mg | SUBLINGUAL_TABLET | SUBLINGUAL | 3 refills | Status: DC | PRN

## 2018-12-09 NOTE — Patient Instructions (Addendum)
Medication Instructions:  Your physician has recommended you make the following change in your medication:  USE Nitroglycerin under your tongue if you have chest pain You may repeat 5 and 10 minutes later. If pain persists, call 911  If you need a refill on your cardiac medications before your next appointment, please call your pharmacy.   Lab work: Your physician recommends that you return for lab work in: 1 week before your Coronary CT  If you have labs (blood work) drawn today and your tests are completely normal, you will receive your results only by: Marland Kitchen MyChart Message (if you have MyChart) OR . A paper copy in the mail If you have any lab test that is abnormal or we need to change your treatment, we will call you to review the results.  Testing/Procedures: Your cardiac CT will be scheduled at one of the below locations:   [facility] [address] [place], [place] [address] (336) [address] [place], [place] [address] [phone]  If scheduled at [facility], please arrive at the [facility] main entrance of [facility] 30-45 minutes prior to test start time. Proceed to the [facility] Radiology Department (first floor) to check-in and test prep.  If scheduled at [facility], please arrive 15 mins early for check-in and test prep.  Please follow these instructions carefully (unless otherwise directed):  Hold all erectile dysfunction medications at least 3 days (72 hrs) prior to test.  On the Night Before the Test: . Be sure to Drink plenty of water. . Do not consume any caffeinated/decaffeinated beverages or chocolate 12 hours prior to your test. . Do not take any antihistamines 12 hours prior to your test.  On the Day of the Test: . Drink plenty of water. Do not drink any water within one hour of the test. . Do not eat any food 4 hours  prior to the test. . You may take your regular medications prior to the test.  . Take metoprolol (Lopressor) two hours prior to test. . FEMALES- please wear underwire-free bra if available      After the Test: . Drink plenty of water. . After receiving IV contrast, you may experience a mild flushed feeling. This is normal. . On occasion, you may experience a mild rash up to 24 hours after the test. This is not dangerous. If this occurs, you can take Benadryl 25 mg and increase your fluid intake. . If you experience trouble breathing, this can be serious. If it is severe call 911 IMMEDIATELY. If it is mild, please call our office. . If you take any of these medications: Glipizide/Metformin, Avandament, Glucavance, please do not take 48 hours after completing test unless otherwise instructed.    Please contact the cardiac imaging nurse navigator should you have any questions/concerns Marchia Bond, RN Navigator Cardiac Imaging [facility] [phone] Office  [phone] Cell    Follow-Up: At [facility], you and your health needs are our priority.  As part of our continuing mission to provide you with exceptional heart care, we have created designated Provider Care Teams.  These Care Teams include your primary Cardiologist (physician) and Advanced Practice Providers (APPs -  Physician Assistants and Nurse Practitioners) who all work together to provide you with the care you need, when you need it. You will need a follow up appointment in:  6 months.  Please call our office 2 months in advance to schedule this appointment.  You may see Mertie Moores, MD or one of the following Advanced Practice Providers on your designated Care Team: Richardson Dopp, PA-C [place], [place] . Daune Perch, NP

## 2018-12-15 DIAGNOSIS — M542 Cervicalgia: Secondary | ICD-10-CM | POA: Diagnosis not present

## 2018-12-15 DIAGNOSIS — M791 Myalgia, unspecified site: Secondary | ICD-10-CM | POA: Diagnosis not present

## 2018-12-15 DIAGNOSIS — G518 Other disorders of facial nerve: Secondary | ICD-10-CM | POA: Diagnosis not present

## 2018-12-15 DIAGNOSIS — G43019 Migraine without aura, intractable, without status migrainosus: Secondary | ICD-10-CM | POA: Diagnosis not present

## 2018-12-19 ENCOUNTER — Other Ambulatory Visit: Payer: Self-pay

## 2018-12-19 ENCOUNTER — Ambulatory Visit (INDEPENDENT_AMBULATORY_CARE_PROVIDER_SITE_OTHER): Payer: Medicare Other | Admitting: Psychiatry

## 2018-12-19 DIAGNOSIS — F330 Major depressive disorder, recurrent, mild: Secondary | ICD-10-CM

## 2018-12-19 NOTE — Progress Notes (Signed)
[facility] Counselor/Therapist Progress Note  Patient ID: [name], MRN: [id number],    Date: 12/19/2018  Time Spent: 60 minutes    11:00am to 12:00noon  Treatment Type: Individual Therapy  Reported Symptoms: depression, anxiety  Mental Status Exam:  Appearance:   Casual     Behavior:  Appropriate and Sharing  Motor:  Normal  Speech/Language:   Normal Rate  Affect:  Depressed and anxious  Mood:  anxious and depressed  Thought process:  goal directed  Thought content:    WNL  Sensory/Perceptual disturbances:    WNL  Orientation:  oriented to person, place, time/date, situation, day of week, month of year and year  Attention:  Good  Concentration:  Good  Memory:  some issues with "forgetting words temporarily"  Fund of knowledge:   Good  Insight:    Good  Judgment:   Good  Impulse Control:  Good   Risk Assessment: Danger to Self:  No Self-injurious Behavior: No Danger to Others: No Duty to Warn:no Physical Aggression / Violence:No  Access to Firearms a concern: No  Gang Involvement:No   Subjective:  Patient in today with depression and anxiety, particularly I reference to issues with one of her daughters where there has been escalating "differences".  Frustrated. Hurt. Misunderstood.  Interventions: Cognitive Behavioral Therapy  Diagnosis:   ICD-10-CM   1. Major depressive disorder, recurrent episode, mild ([place])  F33.0      Plan: Patient not signing tx plan updates on computer screen due to [place].  Treatment goals: Goals may remain on tx plan as patient works on strategies to meet her goals.Progress is noted every session in the "Progress" section of Plan.  Long term goal: Develop healthy cognitive patterns and beliefs about self and the world that lead to alleviation of depression and anxiety, and help prevent relapse of depression and anxiety.  Short term goal: Learn and implement personal skills for managing stress, solving daily  problems, and resolving conflicts effectively.  Strategies: Use modeling, role-playing, behavioral rehearsal, and constructive feedback for patient in trying to develop more skills in managing stress, conflict, and daily problems.Aslo recognize negative thought patterns that create depressive feelings and depressive actions, interrupt them and replace with more positive reality-based thoughts that do not support depression.  Progress: Patient and I focus more intently today on her short term goal especially in managing conflict more effectively and managing daily stresses.  Is having more issues within family that are stressful and conflictual.  Patient finding it hard at times not to respond out of her "anger" (which is likely related to some unresolved hurt and grief).  We talked about this today and patient was able to be very open about past hurts and grief involving the daughter as well as recent hurts even though they are not having any direct contact as the daughter will not allow it. Patient shared a message she had planned on sending to daughter via mail, and after discussing it in session today, she chose to refrain from sending it as it was patient expressing herself during a very angry time recently and she ended up realizing the message would likely cause more harm and not to anything positive or productive for their relationship. In midst of this, patient was able with some help to see how her negative/anxious/angry thoughts led to her feeling increased anger which eventually resulted in angry behaviors and expression.  She seems to be better grasping, in the moment, the connection between her  thoughts and feelings/behaviors. Maintaining her gains in this is a work in progress.  Using some strategies with benefit including more positive self-talk (although sometimes delayed), exercise/walking, slow deep breathing exercises, trying to set better boundaries, and working on her self-confidence.  Also still working to decrease worrying habit and negative assumptions.   Goal review and progress noted with patient.    Next appt within 2 weeks.   Shanon Ace, LCSW

## 2019-01-02 ENCOUNTER — Other Ambulatory Visit: Payer: Self-pay

## 2019-01-02 ENCOUNTER — Ambulatory Visit (INDEPENDENT_AMBULATORY_CARE_PROVIDER_SITE_OTHER): Payer: Medicare Other | Admitting: Psychiatry

## 2019-01-02 DIAGNOSIS — F411 Generalized anxiety disorder: Secondary | ICD-10-CM

## 2019-01-02 NOTE — Progress Notes (Signed)
      [facility] Counselor/Therapist Progress Note  Patient ID: DELANCEY FARHAN, MRN: [id number],    Date: 01/02/2019   Time Spent: 45 minutes 12:15pm to 1:00pm  Treatment Type: Individual Therapy  Reported Symptoms: anxiety, depression  Mental Status Exam:  Appearance:   Casual     Behavior:  Appropriate and Sharing  Motor:  Normal  Speech/Language:   Normal Rate  Affect:  anxious  Mood:  anxious and depressed  Thought process:  goal directed  Thought content:    WNL  Sensory/Perceptual disturbances:    WNL  Orientation:  oriented to person, place, time/date, situation, day of week, month of year and year  Attention:  Good  Concentration:  Good  Memory:  Pt reports some "forgetting of words" occasionally  Fund of knowledge:   Good  Insight:    Good  Judgment:   Good  Impulse Control:  Good   Risk Assessment: Danger to Self:  No Self-injurious Behavior: No Danger to Others: No Duty to Warn:no Physical Aggression / Violence:No  Access to Firearms a concern: No  Gang Involvement:No   Subjective:  Patient in today feeling anxious, stressed, and depressed.  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   Interventions: Cognitive Behavioral Therapy and Solution-Oriented/Positive Psychology  Diagnosis:   ICD-10-CM   1. Generalized anxiety disorder  F41.1     Plan: Patient not signing tx plan updates on computer screen due to [place].  Treatment goals: Goals may remain on tx plan as patient works on strategies to meet her goals.Progress is noted every session in the "Progress" section of Plan.  Long term goal: Develop healthy cognitive patterns and beliefs about self and the world that lead to alleviation of depression and anxiety, and help prevent relapse of depression and anxiety.  Short term goal: Learn and implement personal skills for managing stress, solving daily problems,  and resolving conflicts effectively.  Strategies: Use modeling, role-playing, behavioral rehearsal, and constructive feedback for patient in trying to develop more skills in managing stress, conflict, and daily problems.Aslo recognize negative thought patterns that create depressive feelings and depressive actions, interrupt them and replace with more positive reality-based thoughts that do not support depression.  Progress: Patient has worked hard on her long term goal and trying to develop healthier thought patterns that do not lead to depression nor anxiety.  Her efforts are good but is struggling to hold onto her gains, especially when life stressors are heightened.  Incident recently with oldest adult daughter has increased her anxiety and some depression most recently.  We focused more specifically today on her most immediate thoughts that seem to be feeding her anxiety.  We worked on changing those particular thoughts to be more positive, calming, and reality-based thoughts that would not support depression nor anxiety. Also looked at how the more positive/calming thought could be helpful with her thinking about issue with adult daughter. She is to practice such thoughts in the meantime, and we will follow up next session.   Next appt within 2-3 weeks.   Shanon Ace, LCSW

## 2019-01-03 ENCOUNTER — Other Ambulatory Visit: Payer: Medicare Other

## 2019-01-03 ENCOUNTER — Other Ambulatory Visit: Payer: Self-pay

## 2019-01-03 DIAGNOSIS — I493 Ventricular premature depolarization: Secondary | ICD-10-CM

## 2019-01-03 DIAGNOSIS — E785 Hyperlipidemia, unspecified: Secondary | ICD-10-CM | POA: Diagnosis not present

## 2019-01-03 DIAGNOSIS — R079 Chest pain, unspecified: Secondary | ICD-10-CM | POA: Diagnosis not present

## 2019-01-03 LAB — BASIC METABOLIC PANEL
BUN/Creatinine Ratio: 13 (ref 12–28)
BUN: 12 mg/dL (ref 8–27)
CO2: 25 mmol/L (ref 20–29)
Calcium: 9.7 mg/dL (ref 8.7–10.3)
Chloride: 101 mmol/L (ref 96–106)
Creatinine, Ser: 0.89 mg/dL (ref 0.57–1.00)
GFR calc Af Amer: 76 mL/min/{1.73_m2} (ref >59)
GFR calc non Af Amer: 66 mL/min/{1.73_m2} (ref >59)
Glucose: 91 mg/dL (ref 65–99)
Potassium: 4.7 mmol/L (ref 3.5–5.2)
Sodium: 138 mmol/L (ref 134–144)

## 2019-01-04 ENCOUNTER — Ambulatory Visit: Payer: Medicare Other | Admitting: Psychiatry

## 2019-01-05 ENCOUNTER — Other Ambulatory Visit: Payer: Self-pay | Admitting: Psychiatry

## 2019-01-05 DIAGNOSIS — F330 Major depressive disorder, recurrent, mild: Secondary | ICD-10-CM

## 2019-01-05 DIAGNOSIS — F411 Generalized anxiety disorder: Secondary | ICD-10-CM

## 2019-01-11 ENCOUNTER — Telehealth (HOSPITAL_COMMUNITY): Payer: Self-pay | Admitting: Emergency Medicine

## 2019-01-11 ENCOUNTER — Other Ambulatory Visit (HOSPITAL_COMMUNITY): Payer: Self-pay | Admitting: Emergency Medicine

## 2019-01-11 ENCOUNTER — Other Ambulatory Visit: Payer: Self-pay | Admitting: Emergency Medicine

## 2019-01-11 DIAGNOSIS — R079 Chest pain, unspecified: Secondary | ICD-10-CM

## 2019-01-11 NOTE — Telephone Encounter (Signed)
pt has "ro-bo caller filter" on phone which screens calls, was not able to reach patient

## 2019-01-11 NOTE — Telephone Encounter (Signed)
Pt returning phone call regarding upcoming cardiac imaging study; pt verbalizes understanding of appt date/time, parking situation and where to check in, pre-test NPO status and medications ordered, and verified current allergies; name and call back number provided for further questions should they arise Korde Jeppsen RN Navigator Cardiac Imaging [facility] [phone] office [phone] cell   

## 2019-01-11 NOTE — Progress Notes (Signed)
Added CT angio chest aorta per Dr Acie Fredrickson  Pt with appt for CCTA on 11/13 and Dr. Acie Fredrickson also wanted to eval for possible aortic dilatation/dissection per office note.   Pre-cert team notified of added order

## 2019-01-13 ENCOUNTER — Ambulatory Visit: Payer: Medicare Other | Admitting: Psychiatry

## 2019-01-13 ENCOUNTER — Encounter (HOSPITAL_COMMUNITY): Payer: Self-pay

## 2019-01-13 ENCOUNTER — Ambulatory Visit (HOSPITAL_COMMUNITY)
Admission: RE | Admit: 2019-01-13 | Discharge: 2019-01-13 | Disposition: A | Discharge status: Home / Self Care | Payer: Medicare Other | Source: Ambulatory Visit | Attending: Cardiovascular Disease | Admitting: Cardiovascular Disease

## 2019-01-13 ENCOUNTER — Other Ambulatory Visit: Payer: Self-pay

## 2019-01-13 DIAGNOSIS — I200 Unstable angina: Secondary | ICD-10-CM | POA: Diagnosis not present

## 2019-01-13 DIAGNOSIS — R079 Chest pain, unspecified: Secondary | ICD-10-CM | POA: Insufficient documentation

## 2019-01-13 MED ORDER — IOHEXOL 350 MG/ML SOLN
100.0000 mL | Freq: Once | INTRAVENOUS | Status: AC | PRN
  Administered 2019-01-13: 100 mL via INTRAVENOUS

## 2019-01-13 MED ORDER — NITROGLYCERIN 0.4 MG SL SUBL
SUBLINGUAL_TABLET | SUBLINGUAL | Status: AC
  Filled 2019-01-13: qty 2

## 2019-01-13 MED ORDER — METOPROLOL TARTRATE 5 MG/5ML IV SOLN: 5.0000 mg | INTRAVENOUS | Status: DC | PRN

## 2019-01-13 MED ORDER — METOPROLOL TARTRATE 5 MG/5ML IV SOLN
INTRAVENOUS | Status: AC
  Filled 2019-01-13: qty 5

## 2019-01-13 MED ORDER — NITROGLYCERIN 0.4 MG SL SUBL
0.8000 mg | SUBLINGUAL_TABLET | Freq: Once | SUBLINGUAL | Status: AC
  Administered 2019-01-13: 0.8 mg via SUBLINGUAL

## 2019-01-16 ENCOUNTER — Other Ambulatory Visit: Payer: Self-pay

## 2019-01-16 ENCOUNTER — Ambulatory Visit (INDEPENDENT_AMBULATORY_CARE_PROVIDER_SITE_OTHER): Payer: Medicare Other | Admitting: Psychiatry

## 2019-01-16 ENCOUNTER — Encounter: Payer: Self-pay | Admitting: Psychiatry

## 2019-01-16 ENCOUNTER — Ambulatory Visit: Payer: Medicare Other | Admitting: Psychiatry

## 2019-01-16 DIAGNOSIS — F411 Generalized anxiety disorder: Secondary | ICD-10-CM | POA: Diagnosis not present

## 2019-01-16 DIAGNOSIS — F5101 Primary insomnia: Secondary | ICD-10-CM | POA: Diagnosis not present

## 2019-01-16 DIAGNOSIS — F330 Major depressive disorder, recurrent, mild: Secondary | ICD-10-CM

## 2019-01-16 MED ORDER — DULOXETINE HCL 60 MG PO CPEP: 60.0000 mg | ORAL_CAPSULE | Freq: Every day | ORAL | 1 refills | Status: DC

## 2019-01-16 MED ORDER — BUSPIRONE HCL 15 MG PO TABS: 15.0000 mg | ORAL_TABLET | Freq: Two times a day (BID) | ORAL | 1 refills | Status: DC

## 2019-01-16 MED ORDER — LORAZEPAM 1 MG PO TABS: ORAL_TABLET | ORAL | 5 refills | Status: DC
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
## 2019-01-19 DIAGNOSIS — R040 Epistaxis: Secondary | ICD-10-CM | POA: Diagnosis not present

## 2019-01-19 DIAGNOSIS — Z7289 Other problems related to lifestyle: Secondary | ICD-10-CM | POA: Diagnosis not present

## 2019-01-19 DIAGNOSIS — J343 Hypertrophy of nasal turbinates: Secondary | ICD-10-CM | POA: Diagnosis not present

## 2019-01-20 ENCOUNTER — Ambulatory Visit (INDEPENDENT_AMBULATORY_CARE_PROVIDER_SITE_OTHER): Payer: Medicare Other | Admitting: Psychiatry

## 2019-01-20 ENCOUNTER — Other Ambulatory Visit: Payer: Self-pay

## 2019-01-20 DIAGNOSIS — F411 Generalized anxiety disorder: Secondary | ICD-10-CM | POA: Diagnosis not present

## 2019-01-20 NOTE — Progress Notes (Signed)
[facility] Counselor/Therapist Progress Note  Patient ID: [name], MRN: [id number],    Date: 01/20/2019  Time Spent: 60 minutes  12:00noon to 1:00pm  Treatment Type: Individual Therapy  Reported Symptoms: anxiety, depression (both have been some less the past week)  Mental Status Exam:  Appearance:   Well Groomed     Behavior:  Appropriate and Sharing  Motor:  Normal  Speech/Language:   Clear and Coherent  Affect:  anxious, depression  Mood:  anxious and depressed  Thought process:  goal directed  Thought content:    WNL  Sensory/Perceptual disturbances:    WNL  Orientation:  oriented to person, place, time/date, situation, day of week, month of year and year  Attention:  Good  Concentration:  Good  Memory:  Pt reports short term memory issues losing words, and PCP is aware.  Fund of knowledge:   Good  Insight:    Good  Judgment:   Good  Impulse Control:  Good   Risk Assessment: Danger to Self:  No Self-injurious Behavior: No Danger to Others: No Duty to Warn:no Physical Aggression / Violence:No  Access to Firearms a concern: No  Gang Involvement:No   Subjective:  Patient in today with anxiety and depression "and both have decreased a little"  Interventions: Cognitive Behavioral Therapy and Solution-Oriented/Positive Psychology  Diagnosis:   ICD-10-CM   1. Generalized anxiety disorder  F41.1     Plan: Patient not signing tx plan updates on computer screen due to [place].  Treatment goals: Goals may remain on tx plan as patient works on strategies to meet her goals.Progress is noted every session in the "Progress" section of Plan.  Long term goal: Develop healthy cognitive patterns and beliefs about self and the world that lead to alleviation of depression and anxiety, and help prevent relapse of depression and anxiety.  Short term goal: Learn and implement personal skills for managing stress, solving daily problems, and resolving  conflicts effectively.  Strategies: Use modeling, role-playing, behavioral rehearsal, and constructive feedback for patient in trying to develop more skills in managing stress, conflict, and daily problems.Aslo recognize negative thought patterns that create depressive feelings and depressive actions, interrupt them and replace with more positive reality-based thoughts that do not support depression.  Progress: Patient in today feeling anxious and depressed,but less than she was at last appointment.  She did try to follow up on homework from last session and did find it difficult as she continued to work on issues with her oldest daughter.  Patient explained update as to daughter's situation and how that has impacted patient, mostly in terms of some anxiety and depression and a sense of "not knowing what may happen next". Has also had more concern recently about her husband an his health problems although not a lot worse.  Patient shared a couple examples of where she has tried to recognize anxious/negative thoughts, interrupt them, and change them to thoughts that are more calming, reality-based, and empowering. Sometimes she finds that the thoughts come on quickly and she's feeling quite anxious before she is able to try and interrupt them in order to replace them.  Suggested proactively being more self-aware and trying to catch triggers earlier, and that could help her be more successful in changing the more anxiety-producing thought patterns.  Patient also struggles with similar experiences when her depressive feelings are a problem.  She does seem to have a little more energy, motivation, and insight as to the thoughts patterns and  behaviors she is working to change.  Has a few close friends and church friends which is supportive for patient.  Goal review and progress/insight/efforts noted with patient.                                Next appt within 2-3 weeks.   Shanon Ace,  LCSW

## 2019-01-24 ENCOUNTER — Other Ambulatory Visit: Payer: Self-pay

## 2019-01-24 ENCOUNTER — Encounter: Payer: Self-pay | Admitting: Rheumatology

## 2019-01-24 ENCOUNTER — Telehealth (INDEPENDENT_AMBULATORY_CARE_PROVIDER_SITE_OTHER): Payer: Medicare Other | Admitting: Rheumatology

## 2019-01-24 DIAGNOSIS — M7712 Lateral epicondylitis, left elbow: Secondary | ICD-10-CM

## 2019-01-24 DIAGNOSIS — Z8719 Personal history of other diseases of the digestive system: Secondary | ICD-10-CM | POA: Diagnosis not present

## 2019-01-24 DIAGNOSIS — F5101 Primary insomnia: Secondary | ICD-10-CM | POA: Diagnosis not present

## 2019-01-24 DIAGNOSIS — M797 Fibromyalgia: Secondary | ICD-10-CM

## 2019-01-24 DIAGNOSIS — M5030 Other cervical disc degeneration, unspecified cervical region: Secondary | ICD-10-CM | POA: Diagnosis not present

## 2019-01-24 DIAGNOSIS — E559 Vitamin D deficiency, unspecified: Secondary | ICD-10-CM | POA: Diagnosis not present

## 2019-01-24 DIAGNOSIS — M19041 Primary osteoarthritis, right hand: Secondary | ICD-10-CM

## 2019-01-24 DIAGNOSIS — Z8669 Personal history of other diseases of the nervous system and sense organs: Secondary | ICD-10-CM | POA: Diagnosis not present

## 2019-01-24 DIAGNOSIS — M79642 Pain in left hand: Secondary | ICD-10-CM

## 2019-01-24 DIAGNOSIS — M1712 Unilateral primary osteoarthritis, left knee: Secondary | ICD-10-CM | POA: Diagnosis not present

## 2019-01-24 DIAGNOSIS — Z96651 Presence of right artificial knee joint: Secondary | ICD-10-CM

## 2019-01-24 DIAGNOSIS — M19042 Primary osteoarthritis, left hand: Secondary | ICD-10-CM

## 2019-01-24 DIAGNOSIS — M79672 Pain in left foot: Secondary | ICD-10-CM

## 2019-01-24 DIAGNOSIS — M79641 Pain in right hand: Secondary | ICD-10-CM

## 2019-01-24 DIAGNOSIS — M79671 Pain in right foot: Secondary | ICD-10-CM

## 2019-01-24 DIAGNOSIS — I200 Unstable angina: Secondary | ICD-10-CM

## 2019-01-24 DIAGNOSIS — M810 Age-related osteoporosis without current pathological fracture: Secondary | ICD-10-CM | POA: Diagnosis not present

## 2019-01-24 DIAGNOSIS — M7502 Adhesive capsulitis of left shoulder: Secondary | ICD-10-CM

## 2019-01-24 DIAGNOSIS — Z8659 Personal history of other mental and behavioral disorders: Secondary | ICD-10-CM

## 2019-01-24 NOTE — Progress Notes (Signed)
Virtual Visit via Video Note  I connected with [name] on 01/24/19 at  3:15 PM EST by a video enabled telemedicine application and verified that I am speaking with the correct person using two identifiers.  Location: Patient: Home  Provider: Clinic   This service was conducted via virtual visit.  Both audio and visual tools were used.  The patient was located at home. I was located in my office.  Consent was obtained prior to the virtual visit and is aware of possible charges through their insurance for this visit.  The patient is an established patient.  Dr. Estanislado Pandy, MD conducted the virtual visit and Hazel Sams, PA-C acted as scribe during the service.  Office staff helped with scheduling follow up visits after the service was conducted.   I discussed the limitations of evaluation and management by telemedicine and the availability of in person appointments. The patient expressed understanding and agreed to proceed.  CC: Pain in both hands  History of Present Illness: [name] is a 68 y.o. female with history of fibromyalgia, DDD, and osteoarthritis.  She is experiencing increased pain in both hands but denies any joint swelling. She is having increased morning stiffness in both hands. She has been having increased pain in her feet first thing in the morning.  She continues to have generalized muscle aches and tenderness due to fibromyalgia.  She experiences muscle lower back pain daily.  She is cymbalta 60 mg 1 capsule by mouth daily.  She takes Robaxin 500 mg every 6 hours prn and flexeril 10 mg 1 tablet by mouth at bedtime for muscle spasms.  She walks 1 mile daily.  Her right knee replacement is doing well.  She is on Prolia 60 mg sq injections every 6 months.    Review of Systems  Constitutional: Negative for fever and malaise/fatigue.  Eyes: Negative for photophobia, pain, discharge and redness.  Respiratory: Negative for cough, shortness of breath and wheezing.    Cardiovascular: Negative for chest pain and palpitations.  Gastrointestinal: Negative for blood in stool, constipation and diarrhea.  Genitourinary: Negative for dysuria.  Musculoskeletal: Positive for back pain, joint pain and myalgias. Negative for neck pain.       +Morning stiffness   Skin: Negative for rash.  Neurological: Negative for dizziness and headaches.  Psychiatric/Behavioral: Negative for depression. The patient is not nervous/anxious and does not have insomnia.       Observations/Objective: Physical Exam  Constitutional: She is oriented to person, place, and time and well-developed, well-nourished, and in no distress.  HENT:  Head: Normocephalic and atraumatic.  Eyes: Conjunctivae are normal.  Pulmonary/Chest: Effort normal.  Neurological: She is alert and oriented to person, place, and time.  Psychiatric: Mood, memory, affect and judgment normal.   Patient reports morning stiffness for 5  minutes.   Patient reports nocturnal pain.  Difficulty dressing/grooming: Denies Difficulty climbing stairs: Denies Difficulty getting out of chair: Denies Difficulty using hands for taps, buttons, cutlery, and/or writing: Denies   Assessment and Plan: Visit Diagnoses: Fibromyalgia: She continues to have generalized muscle aches and muscle tenderness due to fibromyalgia.  She experiences left sided lower back pain on a daily basis. She is not having any symptoms of radiculopathy.   She takes robaxin 500 mg every 6 hours as needed and flexeril 10 mg po at bedtime for muscle spasms. She was encouraged to try to start reducing the dose of flexeril and taking it only as needed. She is on Cymbalta  60 mg by mouth daily.  She does not need any refills at this time.  We discussed the importance of regular exercise and good sleep hygiene.  She is walking 1 mile daily.  We discussed using a foam roller to help with her lower back discomfort. She will follow up in 6 months.   Primary insomnia:  She has interrupted sleep at night due to the discomfort she experiences.   Lateral epicondylitis, left elbow: She has been evaluated by Dr. Amedeo Plenty in the past.  She has occasional discomfort.    Adhesive capsulitis of left shoulder: She has intermittent discomfort in her left shoulder joint.    Primary osteoarthritis of both hands: She is having increased pain in both hands. She has had increased morning stiffness but has no noticed any joint swelling. She has complete fist formation bilaterally. She has a family history of rheumatoid arthritis (mother), so she is concerned about the increased discomfort and stiffness.  We will obtain the following lab work: RF, anti-CCP, 14-3-3 eta, uric acid, ESR.  We will update x-rays of both hands at her follow up visit. We discussed natural antiinflammatories to start taking.  Hand exercises were also discussed.  Joint protection and muscle strengthening were discussed.   Pain in both feet: She has been having increased discomfort in both feet, especially first thing in the morning.  She denies any joint swelling.  She will need to update x-rays of both feet at her follow up visit.  We will obtain the following labs: RF, anti-CCP, ESR, 14-3-3 eta, and uric acid.   Unilateral primary osteoarthritis, left knee: She has intermittent left knee joint pain. She walks 1 mile daily.    Status post total right knee replacement: Doing well.  She has no discomfort at this time.  She walks 1 mile daily.   DDD (degenerative disc disease), cervical - s/p cervical fusion  Age-related osteoporosis without current pathological fracture: She is on Prolia subcutaneous injections every 6 months. She is taking a vitamin D supplement.   Vitamin D deficiency: She is taking a vitamin D supplement.   Other medical conditions are listed as follows:  History of sleep apnea  History of gastroesophageal reflux (GERD)  History of IBS  History of  migraine  History of depression   Follow Up Instructions: She will follow up in 6 months.    I discussed the assessment and treatment plan with the patient. The patient was provided an opportunity to ask questions and all were answered. The patient agreed with the plan and demonstrated an understanding of the instructions.   The patient was advised to call back or seek an in-person evaluation if the symptoms worsen or if the condition fails to improve as anticipated.  I provided 25 minutes of non-face-to-face time during this encounter.  Bo Merino, MD   Scribed by-  Hazel Sams, PA-C

## 2019-01-30 ENCOUNTER — Ambulatory Visit (INDEPENDENT_AMBULATORY_CARE_PROVIDER_SITE_OTHER): Payer: Medicare Other | Admitting: Psychiatry

## 2019-01-30 ENCOUNTER — Other Ambulatory Visit: Payer: Self-pay

## 2019-01-30 DIAGNOSIS — F411 Generalized anxiety disorder: Secondary | ICD-10-CM | POA: Diagnosis not present

## 2019-01-30 NOTE — Progress Notes (Signed)
      [facility] Counselor/Therapist Progress Note  Patient ID: [name], MRN: [id number],    Date: 01/30/2019  Time Spent: 60 minutes 11:00am to 12:00noon  Treatment Type: Individual Therapy  Reported Symptoms: anxiety, some depression, some "better days" and some "worse days"  Mental Status Exam:  Appearance:   Neat     Behavior:  Appropriate and Sharing  Motor:  Normal  Speech/Language:   Normal Rate  Affect:  anxious, some depression  Mood:  anxious and depressed  Thought process:  goal directed  Thought content:    WNL  Sensory/Perceptual disturbances:    WNL  Orientation:  oriented to person, place, time/date, situation, day of week, month of year and year  Attention:  Good  Concentration:  Good  Memory:  Patient reports short term memory concerns/forgetfulness  Fund of knowledge:   Good  Insight:    Good  Judgment:   Good  Impulse Control:  Good   Risk Assessment: Danger to Self:  No Self-injurious Behavior: No Danger to Others: No Duty to Warn:no Physical Aggression / Violence:No  Access to Firearms a concern: No  Gang Involvement:No   Subjective: Patient in today with some increase in anxiety and depression, which patient feels is related to recent Thanksgiving holiday, Covid, and her daughter's situation (daughter that does not allow contact).  Church concern and she is very involved there. Is having more pain with her arthritis and has used Tumeric with some benefit.  Interventions: Cognitive Behavioral Therapy and Solution-Oriented/Positive Psychology  Diagnosis:   ICD-10-CM   1. Generalized anxiety disorder  F41.1     Plan: Patient not signing tx plan updates on computer screen due to [place].  Treatment goals: Goals may remain on tx plan as patient works on strategies to meet her goals.Progress is noted every session in the "Progress" section of Plan.  Long term goal: Develop healthy cognitive patterns and beliefs about self and the  world that lead to alleviation of depression and anxiety, and help prevent relapse of depression and anxiety.  Short term goal: Learn and implement personal skills for managing stress, solving daily problems, and resolving conflicts effectively.  Strategies: Use modeling, role-playing, behavioral rehearsal, and constructive feedback for patient in trying to develop more skills in managing stress, conflict, and daily problems.Aslo recognize negative thought patterns that create depressive feelings and depressive actions, interrupt them and replace with more positive reality-based thoughts that do not support depression.  Progress: Patient is continuing to make progress on her goals, trying to manage stress better and solve daily problems rather than getting overwhelmed or overly anxious or depressed. Trying to pace herself and be more mindful of setting boundaries to help reduce stress and trying to not feel overwhelmed.  Concerns re: husband's health concerns with Parkinson's disease.  Patient able to give some examples of thoughts she was trying to work on stopping and replacing ----thoughts related to husband's health concerns and found herself assuming more of the worst.  Is working to change those negative/"worrisome" thoughts into thoughts that are more positive, hopeful, and reality-based thoughts that do not feed depression nor anxiety. Motivation still pretty good today, as well as insight and energy. Supportive friends which encourage her.  Goal review and progress/efforts noted with patient.   Next appt within 2-3 weeks.   Shanon Ace, LCSW

## 2019-01-31 DIAGNOSIS — G43019 Migraine without aura, intractable, without status migrainosus: Secondary | ICD-10-CM | POA: Diagnosis not present

## 2019-02-07 ENCOUNTER — Telehealth: Payer: Self-pay | Admitting: Rheumatology

## 2019-02-07 DIAGNOSIS — K625 Hemorrhage of anus and rectum: Secondary | ICD-10-CM | POA: Diagnosis not present

## 2019-02-07 DIAGNOSIS — R1013 Epigastric pain: Secondary | ICD-10-CM | POA: Diagnosis not present

## 2019-02-07 DIAGNOSIS — K219 Gastro-esophageal reflux disease without esophagitis: Secondary | ICD-10-CM | POA: Diagnosis not present

## 2019-02-07 NOTE — Telephone Encounter (Signed)
I LMOM for patient to call, and schedule her next follow up appointment for 6 months around 08/01/2019.

## 2019-02-07 NOTE — Telephone Encounter (Signed)
-----   Message from [name], RT sent at 01/25/2019  1:29 PM EST ----- Regarding: 6 MONTH F/U APPT IN OFFICE

## 2019-02-13 ENCOUNTER — Ambulatory Visit (INDEPENDENT_AMBULATORY_CARE_PROVIDER_SITE_OTHER): Payer: Medicare Other | Admitting: Psychiatry

## 2019-02-13 ENCOUNTER — Other Ambulatory Visit: Payer: Self-pay

## 2019-02-13 DIAGNOSIS — F411 Generalized anxiety disorder: Secondary | ICD-10-CM | POA: Diagnosis not present

## 2019-02-13 NOTE — Progress Notes (Signed)
      [facility] Counselor/Therapist Progress Note  Patient ID: [name], MRN: [id number],    Date: 02/13/2019  Time Spent: 60 minutes  11:00am to 12:00noon  Treatment Type: Individual Therapy  Reported Symptoms: anxiety, depression, some tearfulness  Mental Status Exam:  Appearance:   Casual     Behavior:  Appropriate and Sharing  Motor:  Normal  Speech/Language:   Normal Rate  Affect:  anxious  Mood:  anxious and depressed  Thought process:  goal directed  Thought content:    WNL  Sensory/Perceptual disturbances:    WNL  Orientation:  oriented to person, place, time/date, situation, day of week, month of year and year  Attention:  Good  Concentration:  Good  Memory:  "some forgetfulness"  Fund of knowledge:   Good  Insight:    Good  Judgment:   Good  Impulse Control:  Good   Risk Assessment: Danger to Self:  No Self-injurious Behavior: No Danger to Others: No Duty to Warn:no Physical Aggression / Violence:No  Access to Firearms a concern: No  Gang Involvement:No   Subjective: Patient in today, had good Thanksgiving holiday and 1 daughter visited.  Other daughter did not.  Other daughter did receive package of photos from patient. Still tension between son  Interventions: Cognitive Behavioral Therapy and Solution-Oriented/Positive Psychology    Diagnosis:  Generalized Anxiety Disorder,  F41.1    Plan: Patient not signing tx plan updates on computer screen due to [place].  Treatment goals: Goals may remain on tx plan as patient works on strategies to meet her goals.Progress is noted every session in the "Progress" section of Plan.  Long term goal: Develop healthy cognitive patterns and beliefs about self and the world that lead to alleviation of depression and anxiety, and help prevent relapse of depression and anxiety.  Short term goal: Learn and implement personal skills for managing stress, solving daily problems, and resolving conflicts  effectively.  Strategies: Use modeling, role-playing, behavioral rehearsal, and constructive feedback for patient in trying to develop more skills in managing stress, conflict, and daily problems.Aslo recognize negative thought patterns that create depressive feelings and depressive actions, interrupt them and replace with more positive reality-based thoughts that do not support depression.  Progress: Patient  Is continuing to work on her goals especially recognizing cognitive patterns and beliefs about herself and the world. Also working on boundaries.  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 Discussed tendency to worry at times and assume the worst.  To pay more attention to thoughts and automatic assumptions that lead her to feel more anxious or depressed. To practice more consistently in interrupting the anxious thoughts and replacing them with more reality-based, positive, and empowering thoughts that do not support anxiety nor depression.  Goal review and progress noted with patient.  Next appt within 2-3 weeks.   Shanon Ace, LCSW

## 2019-02-20 ENCOUNTER — Telehealth: Payer: Self-pay | Admitting: Psychiatry

## 2019-02-20 NOTE — Telephone Encounter (Signed)
[name] called because she has been experience what she thought was acid reflux but it was very painful so has seen her PCP and has had a full heart work up and everything seems to be fine.  Dr. Collene Mares thinks is it stress related.  You have her on Buspar BID, but he thinks perhaps she should be on something that is short acting.  Anyway, she needs to talk with you about what to do.  No appt until 5t/17/21

## 2019-02-21 ENCOUNTER — Telehealth: Payer: Self-pay | Admitting: Cardiovascular Disease

## 2019-02-21 NOTE — Telephone Encounter (Signed)
New message    Dr Lorie Apley nurse calling to speak with  Dr Acie Fredrickson. Advised nurse Dr Acie Fredrickson was unavailable (vacation), offered DOD. Office declined DOD  Patient not in their  office, complaint of chest pressure. Seeking advice  Please call [phone] Dr Collene Mares

## 2019-02-21 NOTE — Telephone Encounter (Signed)
I have discussed with Dr. Collene Mares. Ani has had a normal myoview, normal coronary CT angiogram.  No aortic pathology seen on CT scan .  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 Collene Mares has evaluated her from a GI standpoint and cannot find any GI issues to treat.  We have prescribed NTG in the past.   We will call to see if she has tried the NTG. No further suggestions at this time She should go to the ER if she has worsening symptoms

## 2019-02-21 NOTE — Telephone Encounter (Signed)
Left message for patient to call back to discuss whether she has used NTG in the past and if it has been helpful

## 2019-02-27 ENCOUNTER — Other Ambulatory Visit: Payer: Self-pay

## 2019-02-27 ENCOUNTER — Ambulatory Visit (INDEPENDENT_AMBULATORY_CARE_PROVIDER_SITE_OTHER): Payer: Medicare Other | Admitting: Psychiatry

## 2019-02-27 DIAGNOSIS — F411 Generalized anxiety disorder: Secondary | ICD-10-CM

## 2019-02-27 NOTE — Progress Notes (Signed)
      [facility] Counselor/Therapist Progress Note  Patient ID: DALIYA SUMI, MRN: [id number],    Date: 02/27/2019  Time Spent: 60 minutes 11:00am to 12:00noon   Treatment Type: Individual Therapy  Reported Symptoms: anxiety, depression, concern over health issue (esophagus and spasms)  Mental Status Exam:  Appearance:   Casual     Behavior:  Appropriate and Sharing  Motor:  Normal  Speech/Language:   Normal Rate  Affect:  anxious, depressed  Mood:  anxious and depressed  Thought process:  normal  Thought content:    WNL  Sensory/Perceptual disturbances:    WNL  Orientation:  oriented to person, place, time/date, situation, day of week, month of year and year  Attention:  Good  Concentration:  Good  Memory:  WNL  Fund of knowledge:   Good  Insight:    Good  Judgment:   Good  Impulse Control:  Good   Risk Assessment: Danger to Self:  No Self-injurious Behavior: No Danger to Others: No Duty to Warn:no Physical Aggression / Violence:No  Access to Firearms a concern: No  Gang Involvement:No   Subjective: Patient in today feeling discouraged about some health issues (esophagus), anxious, some depression.  Sad re: relationship with oldest daughter not being allowed by daughter.  Interventions: Cognitive Behavioral Therapy, Solution-Oriented/Positive Psychology and Ego-Supportive  Diagnosis:   ICD-10-CM   1. Generalized anxiety disorder  F41.1       Plan: Patient not signing tx plan updates on computer screen due to [place].  Treatment goals: Goals may remain on tx plan as patient works on strategies to meet her goals.Progress is noted every session in the "Progress" section of Plan.  Long term goal: Develop healthy cognitive patterns and beliefs about self and the world that lead to alleviation of depression and anxiety, and help prevent relapse of depression and anxiety.  Short term goal: Learn and implement personal skills for managing stress, solving  daily problems, and resolving conflicts effectively.  Strategies: Use modeling, role-playing, behavioral rehearsal, and constructive feedback for patient in trying to develop more skills in managing stress, conflict, and daily problems.Aslo recognize negative thought patterns that create depressive feelings and depressive actions, interrupt them and replace with more positive reality-based thoughts that do not support depression.  Progress: Patient in today stressed with "esophagus spasms and pain".  Reports Christmas holiday was ok but "small with 1 daughter and husband."  Patient continues to work on being aware of her cognitive thought patterns especially those in regards to the way she feels and believes about herself, particularly thinking about my "bad traits, the mistakes I've made in life, have had 5 marriages."  Looked at some of these negative/self-defeating thoughts and how they impact her now, as she has not let go of them yet. Discussed how she does need to let go and how that could  happen for her.  Has worked a bit more on better boundaries since last appt and still needs to continue.  Also making more efforts not to assume the worst.  Goal review and progress noted with patient.    Next appt within 2 weeks.   Shanon Ace, LCSW

## 2019-03-11 ENCOUNTER — Encounter: Payer: Self-pay | Admitting: Obstetrics and Gynecology

## 2019-03-13 ENCOUNTER — Other Ambulatory Visit: Payer: Self-pay

## 2019-03-13 ENCOUNTER — Telehealth: Payer: Self-pay | Admitting: Obstetrics and Gynecology

## 2019-03-13 ENCOUNTER — Ambulatory Visit (INDEPENDENT_AMBULATORY_CARE_PROVIDER_SITE_OTHER): Payer: Medicare Other | Admitting: Psychiatry

## 2019-03-13 DIAGNOSIS — F411 Generalized anxiety disorder: Secondary | ICD-10-CM | POA: Diagnosis not present

## 2019-03-13 DIAGNOSIS — N952 Postmenopausal atrophic vaginitis: Secondary | ICD-10-CM

## 2019-03-13 DIAGNOSIS — Z01419 Encounter for gynecological examination (general) (routine) without abnormal findings: Secondary | ICD-10-CM

## 2019-03-13 MED ORDER — ESTRADIOL 10 MCG VA TABS: 1.0000 | ORAL_TABLET | VAGINAL | 5 refills | Status: DC

## 2019-03-13 NOTE — Progress Notes (Signed)
      [facility] Counselor/Therapist Progress Note  Patient ID: TASNEEM GRIEF, MRN: [id number],    Date: 03/13/2019  Time Spent: 60 minutes 12:00noon to 1:00pm   Treatment Type: Individual Therapy  Reported Symptoms: anxiety, depression  Mental Status Exam:  Appearance:   Casual     Behavior:  Appropriate and Sharing  Motor:  Normal  Speech/Language:   Normal Rate  Affect:  anxious, depressed  Mood:  anxious and depressed  Thought process:  normal  Thought content:    WNL  Sensory/Perceptual disturbances:    WNL  Orientation:  oriented to person, place, time/date, situation, day of week, month of year and year  Attention:  Good  Concentration:  Good  Memory:  WNL  Fund of knowledge:   Good  Insight:    Good  Judgment:   Good  Impulse Control:  Good   Risk Assessment: Danger to Self:  No Self-injurious Behavior: No Danger to Others: No Duty to Warn:no Physical Aggression / Violence:No  Access to Firearms a concern: No  Gang Involvement:No   Subjective: Patient in today with anxiety and depression mostly about the overall influence of the pandemic, unrest in our country, sadness re: losses she sees in her husband with Parkinson's disease. Denies any SI.   Interventions: Cognitive Behavioral Therapy and Solution-Oriented/Positive Psychology  Diagnosis:   ICD-10-CM   1. Generalized anxiety disorder  F41.1     Plan: Patient not signing tx plan updates on computer screen due to [place].  Treatment goals: Goals may remain on tx plan as patient works on strategies to meet her goals.Progress is noted every session in the "Progress" section of Plan.  Long term goal: Develop healthy cognitive patterns and beliefs about self and the world that lead to alleviation of depression and anxiety, and help prevent relapse of depression and anxiety.  Short term goal: Learn and implement personal skills for managing stress, solving daily problems, and resolving conflicts  effectively.  Strategies: Use modeling, role-playing, behavioral rehearsal, and constructive feedback for patient in trying to develop more skills in managing stress, conflict, and daily problems.Also recognize negative thought patterns that create depressive feelings and depressive actions, interrupt them and replace with more positive reality-based thoughts that do not support depression.  Progress: Patient today reports her stress re: esophagus spasms and pain has lessened and the episodes have diminished.  Her negative self-image expressed last session was short-lived per patient and came after a period of high anxiety and frustration. Discussed the negative/anxious/depressive thoughts that she continues to have but is making progress per patient, with several examples being given. Practiced some in session today and patient is better and quicker at recognizing the negative/anxious/depressive thoughts and working to replace them with more reality-based, positive, and empowering thought patterns. Still working on strengthening boundaries within extended family. Trying to keep in her focus not to assume the worst and be mindful of what is in her control and what is not.  Goal review and progress/commitment noted with patient.   Next appt within 2-3 weeks.   Shanon Ace, LCSW

## 2019-03-13 NOTE — Telephone Encounter (Signed)
Ok to send Rx for vaginal estradiol as written.

## 2019-03-13 NOTE — Telephone Encounter (Signed)
Last RX for Yuvafem 10/mcg vag tab sent on 08/15/18, #8/11RF.  Last AEX 08/15/18 Last MMG 09/20/19, neg  Pharmacy updated.  Rx pended for Estradiol 10 mcg vag tab, #8/5RF  Routing to Dr. Quincy Simmonds to review.

## 2019-03-13 NOTE — Telephone Encounter (Signed)
Patient sent the following correspondence through [place].  You prescribed for me Yuvafem. I have just changed drug plans and my drug store is [place] at [address], [phone]. However, my drug plan will only pay for estradiol version of this. The pharmacist said my prescription says only as prescribed. Can you call in a prescription for this please. I have been without for a week.  Thank you, [name]

## 2019-03-13 NOTE — Telephone Encounter (Signed)
Rx sent to pharmacy as written #8, 5 RF.   Will close encounter.

## 2019-03-14 ENCOUNTER — Telehealth: Payer: Self-pay | Admitting: Obstetrics and Gynecology

## 2019-03-14 ENCOUNTER — Encounter: Payer: Self-pay | Admitting: Obstetrics and Gynecology

## 2019-03-14 NOTE — Telephone Encounter (Signed)
Patient sent the following message through [facility]. Routing to triage to assist patient with request.  [facility]  Phone Number: [phone]  As it turns out Estradiol is a Tier 4 drug. I will have to pay $109.22 to start and thereafter $35.   Is there any other medication that will offer the same affects, that is not this expensive or at least not a Tier 4. This is so frustrating.   Thank you for your help.   Sherlon Handing

## 2019-03-15 NOTE — Telephone Encounter (Signed)
Spoke with patient. Patient requesting alternative to estradiol vag tab. Patient is not sure what alternatives are covered by her plan. Recommended patient check with her plan to review covered alternatives and return call to office. Will then review alternatives with Dr. Quincy Simmonds. Patient agreeable.

## 2019-03-16 DIAGNOSIS — E038 Other specified hypothyroidism: Secondary | ICD-10-CM | POA: Diagnosis not present

## 2019-03-21 DIAGNOSIS — R202 Paresthesia of skin: Secondary | ICD-10-CM | POA: Diagnosis not present

## 2019-03-21 DIAGNOSIS — R002 Palpitations: Secondary | ICD-10-CM | POA: Diagnosis not present

## 2019-03-21 DIAGNOSIS — K219 Gastro-esophageal reflux disease without esophagitis: Secondary | ICD-10-CM | POA: Diagnosis not present

## 2019-03-21 DIAGNOSIS — F329 Major depressive disorder, single episode, unspecified: Secondary | ICD-10-CM | POA: Diagnosis not present

## 2019-03-21 DIAGNOSIS — E039 Hypothyroidism, unspecified: Secondary | ICD-10-CM | POA: Diagnosis not present

## 2019-03-22 ENCOUNTER — Telehealth: Payer: Self-pay | Admitting: Psychiatry

## 2019-03-22 DIAGNOSIS — F411 Generalized anxiety disorder: Secondary | ICD-10-CM

## 2019-03-22 MED ORDER — BUSPIRONE HCL 30 MG PO TABS: 30.0000 mg | ORAL_TABLET | Freq: Two times a day (BID) | ORAL | 0 refills | Status: DC

## 2019-03-22 NOTE — Telephone Encounter (Signed)
She takes Buspar 15 Mg in the morning and at bedtime and Ativan 2 at night and 1 during the day. She states with the Ativan she only has 30 extra a month and that is only enough for 1 a day so she cannot take an extra 1 for PRN. She spoke with her PCP yesterday and he suggested she ask [name] to increase her Buspar for her anxiety. Please advise.

## 2019-03-22 NOTE — Telephone Encounter (Signed)
Left her a VM to return my call.

## 2019-03-22 NOTE — Telephone Encounter (Signed)
Pt left message stating she is having a lot of anxiety and spoke to PCP about it. Seems to think Ativan or to increase Buspar. Please return call and advise your thoughts. [phone].

## 2019-03-23 ENCOUNTER — Other Ambulatory Visit: Payer: Self-pay

## 2019-03-23 DIAGNOSIS — F411 Generalized anxiety disorder: Secondary | ICD-10-CM

## 2019-03-23 MED ORDER — BUSPIRONE HCL 30 MG PO TABS: 30.0000 mg | ORAL_TABLET | Freq: Two times a day (BID) | ORAL | 0 refills | Status: DC

## 2019-03-23 NOTE — Telephone Encounter (Signed)
Noted will send to updated [place]

## 2019-03-23 NOTE — Telephone Encounter (Signed)
Pt. Agrees to the increase and informed me she changed pharmacies due to a change in insurance. Please send RX to [facility] on [address]. Thanks.

## 2019-03-27 ENCOUNTER — Other Ambulatory Visit: Payer: Self-pay

## 2019-03-27 ENCOUNTER — Ambulatory Visit (INDEPENDENT_AMBULATORY_CARE_PROVIDER_SITE_OTHER): Payer: Medicare Other | Admitting: Psychiatry

## 2019-03-27 DIAGNOSIS — F411 Generalized anxiety disorder: Secondary | ICD-10-CM

## 2019-03-27 NOTE — Progress Notes (Signed)
      [facility] Counselor/Therapist Progress Note  Patient ID: [name], MRN: [id number],    Date: 03/27/2019  Time Spent: 60 minutes 12:00noon to 1:00pm  Treatment Type: Individual Therapy  Reported Symptoms: anxiety, depression, frustration, anger  Mental Status Exam:  Appearance:   Casual     Behavior:  Appropriate and Sharing  Motor:  Normal  Speech/Language:   Normal Rate  Affect:  anxious, some depressed  Mood:  anxious and depressed  Thought process:  goal directed  Thought content:    WNL  Sensory/Perceptual disturbances:    WNL  Orientation:  oriented to person, place, time/date, situation, day of week, month of year and year  Attention:  Good  Concentration:  Good  Memory:  some forgetfulness  Fund of knowledge:   Good  Insight:    Good  Judgment:   Good  Impulse Control:  Good   Risk Assessment: Danger to Self:  No Self-injurious Behavior: No Danger to Others: No Duty to Warn:no Physical Aggression / Violence:No  Access to Firearms a concern: No  Gang Involvement:No   Subjective: Patient today reports some recent increase in anxiety, following birth of grand-daughter who she had not been allowed to see per patient daughter.  Sad, frustrated, anxious, some depression.     Interventions: Cognitive Behavioral Therapy and Solution-Oriented/Positive Psychology  Diagnosis:   ICD-10-CM   1. Generalized anxiety disorder  F41.1     Plan: Patient not signing tx plan updates on computer screen due to [place].  Treatment goals: Goals may remain on tx plan as patient works on strategies to meet her goals.Progress is noted every session in the "Progress" section of Plan.  Long term goal: Develop healthy cognitive patterns and beliefs about self and the world that lead to alleviation of depression and anxiety, and help prevent relapse of depression and anxiety.  Short term goal: Learn and implement personal skills for managing stress, solving daily  problems, and resolving conflicts effectively.  Strategies: Use modeling, role-playing, behavioral rehearsal, and constructive feedback for patient in trying to develop more skills in managing stress, conflict, and daily problems.Also recognize negative thought patterns that create depressive feelings and depressive actions, interrupt them and replace with more positive reality-based thoughts that do not support depression.  Progress; Patient today struggling today with depression, anxiety, and some anger re: daughter who is not having contact with patient. Realizing how negative thoughts really tend to consume her more recently. Talked through her feelings, fears, and anxieties as well as looking at what may help her, especially when alone, and that was to work more on changing her anxious/negative/depressive thoughts (and assumptions), to more positive, realistic, and empowering thoughts that do not support anxiety or depression.  Has some coping strategies that helps: go for walk, talk with husband, playing games, talk with friends online, pray, read helpful books that encourage her.Encouraged to use these strategies and the thought changing.  Goal review and progress noted with patient.    Next appt within 2 wks.   Shanon Ace, LCSW

## 2019-03-30 DIAGNOSIS — M47817 Spondylosis without myelopathy or radiculopathy, lumbosacral region: Secondary | ICD-10-CM | POA: Diagnosis not present

## 2019-03-30 DIAGNOSIS — M545 Low back pain: Secondary | ICD-10-CM | POA: Diagnosis not present

## 2019-04-04 ENCOUNTER — Telehealth: Payer: Self-pay | Admitting: *Deleted

## 2019-04-04 NOTE — Telephone Encounter (Signed)
Patient called yesterday and left a message stating the patient had surgery years ago and did have some pain in the big toe and just wanted to know what would be the next step and the patient does have an appointment with Dr Milinda Pointer on 04-06-2019. Lattie Haw

## 2019-04-04 NOTE — Telephone Encounter (Signed)
Spoke with patient. Patient does not need alternative Rx at this time, plans to continue vaginal estradiol tablet as prescribed. Patient will return call if further assistance is needed.   Routing to provider for final review. Patient is agreeable to disposition. Will close encounter.

## 2019-04-06 ENCOUNTER — Ambulatory Visit (INDEPENDENT_AMBULATORY_CARE_PROVIDER_SITE_OTHER): Payer: Medicare Other | Admitting: Podiatry

## 2019-04-06 ENCOUNTER — Ambulatory Visit (INDEPENDENT_AMBULATORY_CARE_PROVIDER_SITE_OTHER): Payer: Medicare Other

## 2019-04-06 ENCOUNTER — Other Ambulatory Visit: Payer: Self-pay

## 2019-04-06 DIAGNOSIS — M778 Other enthesopathies, not elsewhere classified: Secondary | ICD-10-CM | POA: Diagnosis not present

## 2019-04-06 DIAGNOSIS — M205X2 Other deformities of toe(s) (acquired), left foot: Secondary | ICD-10-CM
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
## 2019-04-10 ENCOUNTER — Other Ambulatory Visit: Payer: Self-pay

## 2019-04-10 ENCOUNTER — Ambulatory Visit (INDEPENDENT_AMBULATORY_CARE_PROVIDER_SITE_OTHER): Payer: Medicare Other | Admitting: Psychiatry

## 2019-04-10 DIAGNOSIS — F411 Generalized anxiety disorder: Secondary | ICD-10-CM | POA: Diagnosis not present

## 2019-04-10 NOTE — Progress Notes (Signed)
      [facility] Counselor/Therapist Progress Note  Patient ID: [name], MRN: [id number],    Date: 04/10/2019  Time Spent: 60 minutes  12:00noon to 1:00pm   Treatment Type: Individual Therapy  Reported Symptoms:  Depression, anxiety (improved)  Mental Status Exam:  Appearance:   Casual     Behavior:  Appropriate and Sharing  Motor:  Normal  Speech/Language:   Normal Rate  Affect:  Appropriate  Mood:  depressed (getting some better)  Thought process:  goal directed  Thought content:    WNL  Sensory/Perceptual disturbances:    WNL  Orientation:  oriented to person, place, time/date, situation, day of week, month of year and year  Attention:  Good  Concentration:  Good  Memory:  some forgetfulness  Fund of knowledge:   Good  Insight:    Good  Judgment:   Good  Impulse Control:  Good   Risk Assessment: Danger to Self:  No Self-injurious Behavior: No Danger to Others: No Duty to Warn:no Physical Aggression / Violence:No  Access to Firearms a concern: No  Gang Involvement:No   Subjective:  Patient in today with depression and anxiety that are decreasing some.  Feels the Buspar is helping her anxiety and some esophageal issues.   Interventions: Cognitive Behavioral Therapy and Solution-Oriented/Positive Psychology  Diagnosis:   ICD-10-CM   1. Generalized anxiety disorder  F41.1     Plan: Patient not signing tx plan updates on computer screen due to [place].  Treatment goals: Goals may remain on tx plan as patient works on strategies to meet her goals.Progress is noted every session in the "Progress" section of Plan.  Long term goal: Develop healthy cognitive patterns and beliefs about self and the world that lead to alleviation of depression and anxiety, and help prevent relapse of depression and anxiety.  Short term goal: Learn and implement personal skills for managing stress, solving daily problems, and resolving conflicts  effectively.  Strategies: Use modeling, role-playing, behavioral rehearsal, and constructive feedback for patient in trying to develop more skills in managing stress, conflict, and daily problems.Alsorecognize negative thought patterns that create depressive feelings and depressive actions, interrupt them and replace with more positive reality-based thoughts that do not support depression.  Progress; Patient in today working on "trying to stay out of the depression hole".  She feels the things that contribute heavily to getting in a hole, include: lack of relationship with daughter [name] and her new baby, health concerns about husband, concerns about other daughter [name]. Also looked at things she can do to proactively: walking with dog and husband, cooking, straightening up at home, games on iPad including some with friends, and exercise online.   Depression had decreased a little and she is hoping for it to increase more. Had incident recently with husband where she said something hurtful to him (untentionally) and dwelled on that for a few days.  States she apologized and that is better now. Working to not hang onto negative feelings and be able to forgive herself in order to move forward. Reviewed this more specifically with patient and she is wanting to incorporate forgiveness more into her life and realize it's for forgiving herself as well as others.  Goal review and progress noted with patient.   Next appt within 2-3 weeks.   Shanon Ace, LCSW

## 2019-04-19 ENCOUNTER — Other Ambulatory Visit (HOSPITAL_COMMUNITY): Payer: Self-pay

## 2019-04-20 ENCOUNTER — Encounter (HOSPITAL_COMMUNITY): Payer: Medicare Other

## 2019-04-24 ENCOUNTER — Other Ambulatory Visit: Payer: Self-pay

## 2019-04-24 ENCOUNTER — Ambulatory Visit (INDEPENDENT_AMBULATORY_CARE_PROVIDER_SITE_OTHER): Payer: Medicare Other | Admitting: Psychiatry

## 2019-04-24 DIAGNOSIS — F411 Generalized anxiety disorder: Secondary | ICD-10-CM

## 2019-04-24 NOTE — Progress Notes (Signed)
      [facility] Counselor/Therapist Progress Note  Patient ID: [name], MRN: [id number],    Date: 04/24/2019  Time Spent: 60 minutes   12:00noon to 1:00pm  Treatment Type: Individual Therapy  Reported Symptoms: anxiety, depression, stressed with family concerns/church role   Mental Status Exam:  Appearance:   Casual     Behavior:  Appropriate, Sharing and Motivated  Motor:  Normal  Speech/Language:   Normal Rate  Affect:  anxious, depressed  Mood:  anxious and depressed  Thought process:  normal  Thought content:    WNL  Sensory/Perceptual disturbances:    WNL  Orientation:  oriented to person, place, time/date, situation, day of week, month of year and year  Attention:  Good  Concentration:  Good  Memory:  some forgetfulness, forgets names sometimes, has mentioned it to PCP earlier and plans to check back with doctor as it has worsened some  Fund of knowledge:   Good  Insight:    Good  Judgment:   Good  Impulse Control:  Good   Risk Assessment: Danger to Self:  No Self-injurious Behavior: No Danger to Others: No Duty to Warn:no Physical Aggression / Violence:No  Access to Firearms a concern: No  Gang Involvement:No   Subjective:  Patient in today reporting anxiety and depression, with some gritting teeth when anxious, "hard on myself", and working to let go of unrealistic expectations.   Interventions: Cognitive Behavioral Therapy and Solution-Oriented/Positive Psychology  Diagnosis:   ICD-10-CM   1. Generalized anxiety disorder  F41.1      Plan: Patient not signing tx plan updates on computer screen due to [place].  Treatment goals: Goals may remain on tx plan as patient works on strategies to meet her goals.Progress is noted every session in the "Progress" section of Plan.  Long term goal: Develop healthy cognitive patterns and beliefs about self and the world that lead to alleviation of depression and anxiety, and help prevent relapse of  depression and anxiety.  Short term goal: Learn and implement personal skills for managing stress, solving daily problems, and resolving conflicts effectively.  Strategies: Use modeling, role-playing, behavioral rehearsal, and constructive feedback for patient in trying to develop more skills in managing stress, conflict, and daily problems.Alsorecognize negative thought patterns that create depressive feelings and depressive actions, interrupt them and replace with more positive reality-based thoughts that do not support depression.  Progress; Patient reports stress wth both daughters and patient is doing some better with limits. Dealing with the ongoing pandemic can be difficult and trying to figure out what to do with her time.  "I feel guilty if I'm just sitting and listening  to a book." Juggling finances can be stressful.  Still trying to stay out of "depression hole".  She states that being busy helps her with this, also not to focus heavily on certain family relationships that are not what she wishes they were (daughter who is distant), and not dwelling on things I cannot control.  Continues to work on forgiveness of herself to help her let go of "negatives" in order to move forward. Did mention late in session about sometimes "my forgetfulness is a little worse."  Suggested she connect with her PCP and have him evaluate her and patient agreed.  Goal review and progress noted with patient.  .  Next appt within 2-3 weeks.   Shanon Ace, LCSW

## 2019-05-01 ENCOUNTER — Other Ambulatory Visit (HOSPITAL_COMMUNITY): Payer: Self-pay | Admitting: *Deleted

## 2019-05-02 ENCOUNTER — Encounter (HOSPITAL_COMMUNITY): Payer: Medicare Other

## 2019-05-05 DIAGNOSIS — E039 Hypothyroidism, unspecified: Secondary | ICD-10-CM | POA: Diagnosis not present

## 2019-05-05 DIAGNOSIS — R413 Other amnesia: Secondary | ICD-10-CM | POA: Diagnosis not present

## 2019-05-05 DIAGNOSIS — F329 Major depressive disorder, single episode, unspecified: Secondary | ICD-10-CM | POA: Diagnosis not present

## 2019-05-08 ENCOUNTER — Ambulatory Visit (INDEPENDENT_AMBULATORY_CARE_PROVIDER_SITE_OTHER): Payer: Medicare Other | Admitting: Psychiatry

## 2019-05-08 ENCOUNTER — Other Ambulatory Visit: Payer: Self-pay

## 2019-05-08 DIAGNOSIS — F411 Generalized anxiety disorder: Secondary | ICD-10-CM

## 2019-05-08 NOTE — Progress Notes (Signed)
      [facility] Counselor/Therapist Progress Note  Patient ID: [name], MRN: [id number],    Date: 05/08/2019  Time Spent: 60 minutes   11:00am to 12:00noon  Treatment Type: Individual Therapy  Reported Symptoms: anxiety, depression  Mental Status Exam:  Appearance:   Neat     Behavior:  Appropriate and Sharing  Motor:  Normal  Speech/Language:   Normal Rate  Affect:  Anxious, depressed, stressed, anger "at times"  Mood:  anxious, depressed and angry at times  Thought process:  goal directed  Thought content:    WNL  Sensory/Perceptual disturbances:    WNL  Orientation:  oriented to person, place, time/date, situation, day of week, month of year and year  Attention:  Good  Concentration:  Good  Memory:  some forgetfulness and her PCP is aware  Fund of knowledge:   Good  Insight:    Good  Judgment:   Good  Impulse Control:  Good   Risk Assessment: Danger to Self:  No Self-injurious Behavior: No Danger to Others: No Duty to Warn:no Physical Aggression / Violence:No  Access to Firearms a concern: No  Gang Involvement:No   Subjective: Patient in today reporting anxiety and depression.  States "life has just been more stressful lately, between home and church responsibilities."   Interventions: Cognitive Behavioral Therapy and Solution-Oriented/Positive Psychology  Diagnosis:   ICD-10-CM   1. Generalized anxiety disorder  F41.1      Plan: Patient not signing tx plan updates on computer screen due to [place].  Treatment goals: Goals may remain on tx plan as patient works on strategies to meet her goals.Progress is noted every session in the "Progress" section of Plan.  Long term goal: Develop healthy cognitive patterns and beliefs about self and the world that lead to alleviation of depression and anxiety, and help prevent relapse of depression and anxiety.  Short term goal: Learn and implement personal skills for managing stress, solving daily  problems, and resolving conflicts effectively.  Strategies: Use modeling, role-playing, behavioral rehearsal, and constructive feedback for patient in trying to develop more skills in managing stress, conflict, and daily problems.Alsorecognize negative thought patterns that create depressive feelings and depressive actions, interrupt them and replace with more positive reality-based thoughts that do not support depression.  Progress; Patient today in with anxiety, depression, stressed with helping at her church in various ways, as well as family stressors.  Unexpected surprise from daughter who has very little contact, asking her if she'd like to see her new grand-daughter that patient had not seen yet.  Patient discussed the lengthy history with this daughter and the separation that has happened over time.  Talking through this today seemed to be very helpful to patient. Family situations/relationships are uppermost in her mind right.  Anxiety is elevated some and patient worked some today and will in between sessions on interrupting anxious/depressive thoughts and replacing them with more positive, reality-based, current-based, and empowering thoughts that do not support anxiety nor depression.    Goal review and progress noted with patient.  Next appt within 2 weeks.   Shanon Ace, LCSW

## 2019-05-12 DIAGNOSIS — K219 Gastro-esophageal reflux disease without esophagitis: Secondary | ICD-10-CM | POA: Diagnosis not present

## 2019-05-12 DIAGNOSIS — R1013 Epigastric pain: Secondary | ICD-10-CM | POA: Diagnosis not present

## 2019-05-12 DIAGNOSIS — R0789 Other chest pain: Secondary | ICD-10-CM | POA: Diagnosis not present

## 2019-05-22 ENCOUNTER — Ambulatory Visit (INDEPENDENT_AMBULATORY_CARE_PROVIDER_SITE_OTHER): Payer: Medicare Other | Admitting: Psychiatry

## 2019-05-22 ENCOUNTER — Other Ambulatory Visit: Payer: Self-pay

## 2019-05-22 DIAGNOSIS — F411 Generalized anxiety disorder: Secondary | ICD-10-CM

## 2019-05-22 NOTE — Progress Notes (Signed)
      [facility] Counselor/Therapist Progress Note  Patient ID: [name], MRN: [id number],    Date: 05/22/2019  Time Spent: 60 minutes   11:00am to 12:00noon  Treatment Type: Individual Therapy  Reported Symptoms: anxiety, depression, frustration  Mental Status Exam:  Appearance:   Neat     Behavior:  Appropriate, Sharing and Motivated  Motor:  Normal  Speech/Language:   Normal Rate  Affect:  anxious, depressed, fear, apprehension  Mood:  anxious and depressed  Thought process:  goal directed  Thought content:    WNL  Sensory/Perceptual disturbances:    WNL  Orientation:  oriented to person, place, time/date, situation, day of week, month of year and year  Attention:  Good  Concentration:  Good  Memory:  some occasional forgetfulness  Fund of knowledge:   Good  Insight:    Good  Judgment:   Good  Impulse Control:  Good   Risk Assessment: Danger to Self:  No Self-injurious Behavior: No Danger to Others: No Duty to Warn:no Physical Aggression / Violence:No  Access to Firearms a concern: No  Gang Involvement:Yes   Subjective: Patient today reports anxiety, depression, and frustrations personally and within family situations.  The visit with daughter and grandchild that she had not been able to visit, went well.  Some unexpected questions of patient which were uncomfortable and patient discussed that today.   Interventions: Cognitive Behavioral Therapy and Solution-Oriented/Positive Psychology  Diagnosis:   ICD-10-CM   1. Generalized anxiety disorder  F41.1    Plan: Patient not signing tx plan updates on computer screen due to [place].  Treatment goals: Goals may remain on tx plan as patient works on strategies to meet her goals.Progress is noted every session in the "Progress" section of Plan.  Long term goal: Develop healthy cognitive patterns and beliefs about self and the world that lead to alleviation of depression and anxiety, and help prevent  relapse of depression and anxiety.  Short term goal: Learn and implement personal skills for managing stress, solving daily problems, and resolving conflicts effectively.  Strategies: Use modeling, role-playing, behavioral rehearsal, and constructive feedback for patient in trying to develop more skills in managing stress, conflict, and daily problems.Alsorecognize negative thought patterns that create depressive feelings and depressive actions, interrupt them and replace with more positive reality-based thoughts that do not support depression.  Progress; Patient in today with anxiety, depression, stressed, and frustrated. Very stressed recently with adult daughter that she was allowed to visit for the first time in over 8 years. Discussed how this was for her and emotions involved, and she is not sure of next steps.  Some intimacy issues with husband shared and contacts they have had with his urologist. Family circumstances are still volatile and uncertain. Working on recognizing negative thought patterns, interrupting them and trying to change them to be more positive and hopeful. Provided constructive feedback for patient on her negative thought patterns and in developing relationship with daughter [name], as things are changing.  Goal review and progress noted with patient.  Next appt within 2 weeks.   Shanon Ace, LCSW

## 2019-05-23 ENCOUNTER — Ambulatory Visit (HOSPITAL_COMMUNITY)
Admission: RE | Admit: 2019-05-23 | Discharge: 2019-05-23 | Disposition: A | Discharge status: Home / Self Care | Payer: Medicare Other | Source: Ambulatory Visit | Attending: Internal Medicine | Admitting: Internal Medicine

## 2019-05-23 DIAGNOSIS — M810 Age-related osteoporosis without current pathological fracture: Secondary | ICD-10-CM | POA: Insufficient documentation

## 2019-05-23 MED ORDER — DENOSUMAB 60 MG/ML ~~LOC~~ SOSY
60.0000 mg | PREFILLED_SYRINGE | Freq: Once | SUBCUTANEOUS | Status: AC
  Administered 2019-05-23: 60 mg via SUBCUTANEOUS

## 2019-05-23 MED ORDER — DENOSUMAB 60 MG/ML ~~LOC~~ SOSY
PREFILLED_SYRINGE | SUBCUTANEOUS | Status: AC
  Filled 2019-05-23: qty 1

## 2019-05-25 ENCOUNTER — Other Ambulatory Visit: Payer: Self-pay | Admitting: Internal Medicine

## 2019-05-25 DIAGNOSIS — R202 Paresthesia of skin: Secondary | ICD-10-CM

## 2019-05-27 ENCOUNTER — Other Ambulatory Visit: Payer: Self-pay | Admitting: Psychiatry

## 2019-05-27 DIAGNOSIS — F411 Generalized anxiety disorder: Secondary | ICD-10-CM

## 2019-05-30 DIAGNOSIS — G43019 Migraine without aura, intractable, without status migrainosus: Secondary | ICD-10-CM | POA: Diagnosis not present

## 2019-06-05 ENCOUNTER — Other Ambulatory Visit: Payer: Self-pay

## 2019-06-05 ENCOUNTER — Ambulatory Visit (INDEPENDENT_AMBULATORY_CARE_PROVIDER_SITE_OTHER): Payer: Medicare Other | Admitting: Psychiatry

## 2019-06-05 DIAGNOSIS — F411 Generalized anxiety disorder: Secondary | ICD-10-CM | POA: Diagnosis not present

## 2019-06-05 NOTE — Progress Notes (Signed)
[facility] Counselor/Therapist Progress Note  Patient ID: NILA STAIRS, MRN: [id number],    Date: 06/05/2019  Time Spent: 60 minutes   11:00am to 12:00noon  Treatment Type: Individual Therapy  Reported Symptoms: anxiety, some depression, mixed feelings re: family changes, pandemic limitations still bothering her  Mental Status Exam:  Appearance:   Casual     Behavior:  Appropriate, Sharing and Motivated  Motor:  Normal  Speech/Language:   Normal Rate  Affect:  anxious, some depression  Mood:  anxious and some depression  Thought process:  normal  Thought content:    WNL  Sensory/Perceptual disturbances:    WNL  Orientation:  oriented to person, place, time/date, situation, day of week, month of year and year  Attention:  Good  Concentration:  Good  Memory:  some forgetfulness and her PCP is aware, I make lists to help me  Fund of knowledge:   Good  Insight:    Good  Judgment:   Good  Impulse Control:  Good   Risk Assessment: Danger to Self:  No Self-injurious Behavior: No Danger to Others: No Duty to Warn:no Physical Aggression / Violence:No  Access to Firearms a concern: No  Gang Involvement:No   Subjective:  Patient in today with anxiety, stress, and some depression connected with her relationships within family. Negative self-talk has increased.   Interventions: Cognitive Behavioral Therapy and Solution-Oriented/Positive Psychology  Diagnosis:   ICD-10-CM   1. Generalized anxiety disorder  F41.1     Plan: Patient not signing tx plan updates on computer screen due to [place].  Treatment goals: Goals may remain on tx plan as patient works on strategies to meet her goals.Progress is noted every session in the "Progress" section of Plan.  Long term goal: Develop healthy cognitive patterns and beliefs about self and the world that lead to alleviation of depression and anxiety, and help prevent relapse of depression and anxiety.  Short term  goal: Learn and implement personal skills for managing stress, solving daily problems, and resolving conflicts effectively.  Strategies: Use modeling, role-playing, behavioral rehearsal, and constructive feedback for patient in trying to develop more skills in managing stress, conflict, and daily problems.Alsorecognize negative thought patterns that create depressive feelings and depressive actions, interrupt them and replace with more positive reality-based thoughts that do not support depression.  Progress; Patient today reports anxiety and some depression, difficulty "seeing her husband's Parkinson's symptoms worsen sometimes." Easy to feel like she has failed in some situations or made mistakes, when really she has not.  Looked at several situations where she felt she had not handled well, reviewed them and was able see where she really didn't handle things poorly. "I push things down but they bubble back up." Does share these in sessions and may do some journaling before next session but doesn't usually like writing her thoughts down as that feels too exposing.  Shared one of her writings about sadness in the past, and related it to current feelings. Wanting to not be feeling so self-conscious about her feelings and not worried about how others may perceive her.  Homework assignment ---she is bringing in some of those thoughts for [place] to discuss next time.  Also working to decrease her negative self-talk which is likely related to negative thoughts and family relationship challenges over the years. Goal review and progress noted with patient.  Next appt within 2 weeks.   Shanon Ace, LCSW

## 2019-06-09 ENCOUNTER — Ambulatory Visit: Payer: Medicare Other | Admitting: Podiatry

## 2019-06-17 ENCOUNTER — Other Ambulatory Visit: Payer: Self-pay | Admitting: Cardiovascular Disease

## 2019-06-19 ENCOUNTER — Encounter: Payer: Self-pay | Admitting: Podiatrist

## 2019-06-19 ENCOUNTER — Other Ambulatory Visit: Payer: Self-pay

## 2019-06-19 ENCOUNTER — Ambulatory Visit (INDEPENDENT_AMBULATORY_CARE_PROVIDER_SITE_OTHER): Payer: Medicare Other

## 2019-06-19 ENCOUNTER — Ambulatory Visit (INDEPENDENT_AMBULATORY_CARE_PROVIDER_SITE_OTHER): Payer: Medicare Other | Admitting: Podiatrist

## 2019-06-19 ENCOUNTER — Ambulatory Visit (INDEPENDENT_AMBULATORY_CARE_PROVIDER_SITE_OTHER): Payer: Medicare Other | Admitting: Psychiatry

## 2019-06-19 VITALS — Temp 96.3°F

## 2019-06-19 DIAGNOSIS — F411 Generalized anxiety disorder: Secondary | ICD-10-CM | POA: Diagnosis not present

## 2019-06-19 DIAGNOSIS — S9031XA Contusion of right foot, initial encounter: Secondary | ICD-10-CM

## 2019-06-19 DIAGNOSIS — S92534A Nondisplaced fracture of distal phalanx of right lesser toe(s), initial encounter for closed fracture: Secondary | ICD-10-CM | POA: Diagnosis not present

## 2019-06-19 NOTE — Progress Notes (Signed)
Chief Complaint  Patient presents with  . Foot Injury    R foot, 4th toe.  x2 weeks ago. Pt stated, "I hit the toe on a metal frame. Toe and [dorsal forefoot submet 1-4) bruised. Still sore - 3/10 pain in my toe and just below it. There's also pain up my leg for some reason. I have tried to elevate my foot".     HPI: Patient is 69 y.o. female who presents today for a painful right fourth toe after bumping it against a metal dog bed frame.  Relates there was significant pain and some bruising which has improved.  Pain also goes up her foot in the dorsal central lateral portion of the foot.    Allergies  Allergen Reactions  . Codeine Nausea And Vomiting  . Doxycycline Other (See Comments)    Joint pain, LE swelling, GI upset.  . Nsaids Other (See Comments)    Upset stomach  . Sulfa Antibiotics Other (See Comments)    Causes headache  . Sulfamethoxazole Other (See Comments)    Gives headaches    Review of systems is reviewed and negative.   Physical Exam  Patient is awake, alert, and oriented x 3.  In no acute distress.    Vascular status is intact with palpable pedal pulses DP and PT bilateral and capillary refill time less than 3 seconds bilateral.  No edema or erythema noted.  Neurological exam reveals epicritic and protective sensation grossly intact bilateral.  Dermatological exam reveals skin is supple and dry to bilateral feet.  No open lesions present.   Musculoskeletal exam: Musculature intact with dorsiflexion, plantarflexion, inversion, eversion. Ankle and First MPJ joint range of motion normal.  Mild swelling and resolving bruising is noted on the right fourth toe.  Some pain along the extensor digitorum longus tendon is noted with palpation.  Xray shows a slight non displaced Fracture distal phalanx right 4th digit.  Mild joint space narrowing of the first mpj is seen.   Assessment:   ICD-10-CM   1. Contusion of right foot, initial encounter  S90.31XA DG Foot Complete  Right  2. Closed nondisplaced fracture of distal phalanx of lesser toe of right foot, initial encounter  S92.534A      Plan: Recommended a surgical shoe which was dispensed and she was instructed to wear this for the next 3-4 weeks.  Discussed the pain should subside but If pain continues she will be seen back.

## 2019-06-19 NOTE — Patient Instructions (Signed)
Toe Fracture A toe fracture is a break in one of the toe bones (phalanges). A toe fracture may be:  A crack in the surface of the bone (stress fracture). This often occurs in athletes.  A break all the way through the bone (complete fracture). What are the causes? This condition may be caused by:  Direct impact, such as from dropping a heavy object on your toe.  Stubbing your toe.  Twisting or stretching your toe out of place.  Overuse or repetitive exercise. What increases the risk? You are more likely to develop this condition if you:  Play contact sports.  Have a condition that causes the bones to become thin and brittle (osteoporosis).  Have a low calcium level. What are the signs or symptoms? The main symptoms of this condition are swelling and pain in the toe. Other symptoms include:  Bruising.  Stiffness.  Numbness.  A change in the way the toe looks.  Broken bones that poke through the skin.  Blood beneath the toenail. How is this diagnosed? This condition is diagnosed with a physical exam. You may also have X-rays. How is this treated? Treatment for this condition depends on the type of fracture and its severity. Treatment may include:  Taping the broken toe to a toe that is next to it (buddy taping). This is the most common treatment for fractures in which the bone has not moved out of place (non-displaced fracture).  Wearing a shoe that has a wide, rigid sole to protect the toe and to limit its movement.   Managing pain, stiffness, and swelling   If directed, put ice on painful areas: ? Put ice in a plastic bag. ? Place a towel between your skin and the bag.  If you have a shoe, remove it as told by your health care provider.  If you have a cast, place a towel between your cast and the bag. ? Leave the ice on for 20 minutes, 2-3 times per day.  Raise (elevate) the injured area above the level of your heart while you are sitting or lying  down. General instructions  If your toe was treated with buddy taping, follow your health care provider's instructions for changing the gauze and tape. Change it more often if: ? The gauze and tape get wet. If this happens, dry the space between the toes. ? The gauze and tape are too tight and cause your toe to become pale or numb.  If you were not given a protective shoe, wear sturdy, supportive shoes. Your shoes should not pinch your toes and should not fit tightly against your toes.  Do not use any products that contain nicotine or tobacco, such as cigarettes and e-cigarettes. These can delay bone healing. If you need help quitting, ask your health care provider.  Take over-the-counter and prescription medicines only as told by your health care provider.  Keep all follow-up visits as told by your health care provider. This is important. Contact a health care provider if you have:  Pain that gets worse or does not get better with medicine.  A fever.  A bad smell coming from your cast. Get help right away if you have:  Any of the following in your toes or your foot: ? Numbness that gets worse. ? Tingling. ? Coldness. ? Blue skin.  Redness or swelling that gets worse.  Pain that suddenly becomes severe. Summary  A toe fracture is a break in one of the toe bones (phalanges).  Treatment depends on how severe your fracture is and how the pieces of the broken bone line up with each other. Treatment may include buddy taping, wearing a shoe or a cast, or using crutches.  Ice and elevate your foot to help lessen the pain and swelling. This information is not intended to replace advice given to you by your health care provider. Make sure you discuss any questions you have with your health care provider. Document Revised: 06/01/2017 Document Reviewed: 03/30/2017 Elsevier Patient Education  2020 [employer].
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
## 2019-06-20 ENCOUNTER — Other Ambulatory Visit: Payer: Self-pay | Admitting: Podiatrist

## 2019-06-20 DIAGNOSIS — S92534A Nondisplaced fracture of distal phalanx of right lesser toe(s), initial encounter for closed fracture: Secondary | ICD-10-CM

## 2019-06-21 DIAGNOSIS — J300 Vasomotor rhinitis: Secondary | ICD-10-CM | POA: Diagnosis not present

## 2019-06-28 ENCOUNTER — Other Ambulatory Visit: Payer: Self-pay

## 2019-06-28 ENCOUNTER — Ambulatory Visit
Admission: RE | Admit: 2019-06-28 | Discharge: 2019-06-28 | Disposition: A | Discharge status: Home / Self Care | Payer: Medicare Other | Source: Ambulatory Visit | Attending: Internal Medicine | Admitting: Internal Medicine

## 2019-06-28 DIAGNOSIS — R202 Paresthesia of skin: Secondary | ICD-10-CM

## 2019-07-03 ENCOUNTER — Ambulatory Visit (INDEPENDENT_AMBULATORY_CARE_PROVIDER_SITE_OTHER): Payer: Medicare Other | Admitting: Psychiatry

## 2019-07-03 ENCOUNTER — Other Ambulatory Visit: Payer: Self-pay

## 2019-07-03 DIAGNOSIS — F411 Generalized anxiety disorder: Secondary | ICD-10-CM | POA: Diagnosis not present

## 2019-07-03 NOTE — Progress Notes (Signed)
      [facility] Counselor/Therapist Progress Note  Patient ID: [name], MRN: [id number],    Date: 07/03/2019  Time Spent: 60 minutes  11:00am to 12:00noon  Treatment Type: Individual Therapy  Reported Symptoms: anxiety, depression (some better),   Mental Status Exam:  Appearance:   Well Groomed     Behavior:  Appropriate and Sharing  Motor:  Normal  Speech/Language:   Normal Rate  Affect:  anxious, depressed  Mood:  anxious and depressed  Thought process:  goal directed  Thought content:    WNL  Sensory/Perceptual disturbances:    WNL  Orientation:  oriented to person, place, time/date, situation, day of week, month of year and year  Attention:  Good  Concentration:  Good  Memory:  some forgetfulness worse under stress  Fund of knowledge:   Good  Insight:    Good  Judgment:   Good  Impulse Control:  Good   Risk Assessment: Danger to Self:  No Self-injurious Behavior: No Danger to Others: No Duty to Warn:no Physical Aggression / Violence:No  Access to Firearms a concern: No  Gang Involvement:No   Subjective: Patient in today reporting anxiety, depression, and some stress re: family circumstances that are in midst of changing. Trying to stay more in the present in relationship to daughter and 42 month old grandchild.  Interventions: Cognitive Behavioral Therapy and Solution-Oriented/Positive Psychology  Diagnosis:   ICD-10-CM   1. Generalized anxiety disorder  F41.1     Plan: Patient not signing tx plan updates on computer screen due to [place].  Treatment goals: Goals may remain on tx plan as patient works on strategies to meet her goals.Progress is noted every session in the "Progress" section of Plan.  Long term goal: Develop healthy cognitive patterns and beliefs about self and the world that lead to alleviation of depression and anxiety, and help prevent relapse of depression and anxiety.  Short term goal: Learn and implement personal skills  for managing stress, solving daily problems, and resolving conflicts effectively.  Strategies: Use modeling, role-playing, behavioral rehearsal, and constructive feedback for patient in trying to develop more skills in managing stress, conflict, and daily problems.Alsorecognize negative thought patterns that create depressive feelings and depressive actions, interrupt them and replace with more positive reality-based thoughts that do not support depression.  Progress; Patient in today with anxiety, some depression, in midst of family relationships changing with a daughter with some positives occurring also.  Continued stress and dependence from husband with health concerns including Parkinson's symptoms. Did start back in the [employer] program but they left early due to having to wear a mask and that was difficult to be masked and doing the exercises. Discussed patient's difficulties in trying to manage the negativity from husband (some of which they feel is attributed to his health concerns). Works consistently to try to hang on to her more positive outlook including staying in the present, focusing on what may go right versus wrong, and focusing on what she can control verusus what she can't control, and leaning on her faith. Is working with husband to encourage him to use some of the same strategies, including strategy of providing him with constructive feedback as noted in strategy list above.   Goal review and progress noted with patient.  Next appt within 2-3 weeks.   Shanon Ace, LCSW

## 2019-07-14 DIAGNOSIS — M5432 Sciatica, left side: Secondary | ICD-10-CM | POA: Diagnosis not present

## 2019-07-14 DIAGNOSIS — M545 Low back pain: Secondary | ICD-10-CM | POA: Diagnosis not present

## 2019-07-17 ENCOUNTER — Other Ambulatory Visit: Payer: Self-pay

## 2019-07-17 ENCOUNTER — Encounter: Payer: Self-pay | Admitting: Psychiatry

## 2019-07-17 ENCOUNTER — Ambulatory Visit (INDEPENDENT_AMBULATORY_CARE_PROVIDER_SITE_OTHER): Payer: Medicare Other | Admitting: Psychiatry

## 2019-07-17 DIAGNOSIS — F5101 Primary insomnia: Secondary | ICD-10-CM

## 2019-07-17 DIAGNOSIS — F411 Generalized anxiety disorder: Secondary | ICD-10-CM

## 2019-07-17 DIAGNOSIS — F330 Major depressive disorder, recurrent, mild: Secondary | ICD-10-CM

## 2019-07-17 MED ORDER — DULOXETINE HCL 60 MG PO CPEP: 60.0000 mg | ORAL_CAPSULE | Freq: Every day | ORAL | 1 refills | Status: DC

## 2019-07-17 MED ORDER — BUSPIRONE HCL 30 MG PO TABS: ORAL_TABLET | ORAL | 0 refills | Status: DC

## 2019-07-17 MED ORDER — VENLAFAXINE HCL ER 37.5 MG PO CP24: 37.5000 mg | ORAL_CAPSULE | Freq: Every day | ORAL | 1 refills | Status: DC

## 2019-07-17 MED ORDER — LORAZEPAM 1 MG PO TABS: ORAL_TABLET | ORAL | 5 refills | Status: DC
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
## 2019-07-19 ENCOUNTER — Other Ambulatory Visit: Payer: Self-pay

## 2019-07-19 ENCOUNTER — Ambulatory Visit (INDEPENDENT_AMBULATORY_CARE_PROVIDER_SITE_OTHER): Payer: Medicare Other | Admitting: Psychiatry

## 2019-07-19 DIAGNOSIS — F411 Generalized anxiety disorder: Secondary | ICD-10-CM | POA: Diagnosis not present

## 2019-07-19 NOTE — Progress Notes (Signed)
      [facility] Counselor/Therapist Progress Note  Patient ID: [name], MRN: [id number],    Date: 07/19/2019  Time Spent: 60 minutes 11:00am to 12:00noon  Treatment Type: Individual Therapy  Reported Symptoms: increased anxiety, more depressed past couple weeks, some med adjustments within past week  Mental Status Exam:  Appearance:   Casual     Behavior:  Appropriate and Sharing  Motor:  Normal  Speech/Language:   Normal Rate  Affect:  anxious, depressed  Mood:  Anxious, depressed  Thought process:  goal directed  Thought content:    WNL  Sensory/Perceptual disturbances:    WNL  Orientation:  oriented to person, place, time/date, situation, day of week, month of year and year  Attention:  Good  Concentration:  Good  Memory:  forgetfulness at times  Fund of knowledge:   Good  Insight:    Good  Judgment:   Good  Impulse Control:  Good   Risk Assessment: Danger to Self:  No Self-injurious Behavior: No Danger to Others: No Duty to Warn:no Physical Aggression / Violence:No  Access to Firearms a concern: No  Gang Involvement:No   Subjective: Patient in today reporting increased anxiety and increased depression and "not sure why".   Interventions: Cognitive Behavioral Therapy and Solution-Oriented/Positive Psychology  Diagnosis:   ICD-10-CM   1. Generalized anxiety disorder  F41.1      Plan: Patient not signing tx plan updates on computer screen due to [place].  Treatment goals: Goals may remain on tx plan as patient works on strategies to meet her goals.Progress is noted every session in the "Progress" section of Plan.  Long term goal: Develop healthy cognitive patterns and beliefs about self and the world that lead to alleviation of depression and anxiety, and help prevent relapse of depression and anxiety.  Short term goal: Learn and implement personal skills for managing stress, solving daily problems, and resolving conflicts  effectively.  Strategies: Use modeling, role-playing, behavioral rehearsal, and constructive feedback for patient in trying to develop more skills in managing stress, conflict, and daily problems.Alsorecognize negative thought patterns that create depressive feelings and depressive actions, interrupt them and replace with more positive reality-based thoughts that do not support depression.  Progress;  Patient in today with increased depression and anxiety and states she's not sure "why I'd be feeling like this, thought I was doing better."  After some discussion, she felt her increased depression and anxiety was related to multiple stressors including family, health issues, waiting on [place] determination of husband's benefits, and financial. Husband actually doing better more recently, even with his Parkinson's symptoms, and is driving again. Talked about stress management skills that have been helpful to her in past including: walking, listening to audiobooks, playing with her dog, looking for positives vs negatives, staying in the present,  and spending time with daughters and church friends. Reviewed the multiple stressors she and husband have had over recent weeks and months and just venting today about the various stressors in recent months "made me realize how many different stressors we have had and how some are having a more lasting impact."  Encouraged her to use some of the strategies mentioned above as they have proven to be helpful to her.  Goal review and  Progress noted with patient.  Next appt within 2 weeks.   Shanon Ace, LCSW

## 2019-07-28 DIAGNOSIS — M545 Low back pain: Secondary | ICD-10-CM | POA: Diagnosis not present

## 2019-07-28 DIAGNOSIS — M25552 Pain in left hip: Secondary | ICD-10-CM | POA: Diagnosis not present

## 2019-08-02 ENCOUNTER — Other Ambulatory Visit: Payer: Self-pay

## 2019-08-02 ENCOUNTER — Ambulatory Visit (INDEPENDENT_AMBULATORY_CARE_PROVIDER_SITE_OTHER): Payer: Medicare Other | Admitting: Psychiatry

## 2019-08-02 DIAGNOSIS — F411 Generalized anxiety disorder: Secondary | ICD-10-CM | POA: Diagnosis not present

## 2019-08-02 NOTE — Progress Notes (Signed)
[facility] Counselor/Therapist Progress Note  Patient ID: [name], MRN: [id number],    Date: 08/02/2019  Time Spent: 60 minutes  10:30am to 11:30am  Treatment Type: Individual Therapy  Reported Symptoms: anxiety, some depression, anger, frustrations, sleep has been more erratic recently  Mental Status Exam:  Appearance:   Casual     Behavior:  Appropriate and Sharing  Motor:  Normal  Speech/Language:   Normal Rate  Affect:  anxious, depressed  Mood:  anxious, depressed and some anger and frustrations  Thought process:  goal directed  Thought content:    WNL  Sensory/Perceptual disturbances:    WNL  Orientation:  oriented to person, place, time/date, situation, day of week, month of year and year  Attention:  Good  Concentration:  Good  Memory:  can have some forgetfulness at times especially under stress  Fund of knowledge:   Good  Insight:    Good  Judgment:   Good  Impulse Control:  Good   Risk Assessment: Danger to Self:  No Self-injurious Behavior: No Danger to Others: No Duty to Warn:no Physical Aggression / Violence:No  Access to Firearms a concern: No  Gang Involvement:No   Subjective:   Patient today reports anxiety, depression, more frustrated, some anger, and feeling stressed. Less patience with husband [name] even though he is doing some better.   Interventions: Cognitive Behavioral Therapy and Solution-Oriented/Positive Psychology  Diagnosis:   ICD-10-CM   1. Generalized anxiety disorder  F41.1      Plan: Patient not signing tx plan updates on computer screen due to [place].  Treatment goals: Goals may remain on tx plan as patient works on strategies to meet her goals.Progress is noted every session in the "Progress" section of Plan.  Long term goal: Develop healthy cognitive patterns and beliefs about self and the world that lead to alleviation of depression and anxiety, and help prevent relapse of depression and anxiety.  Short  term goal: Learn and implement personal skills for managing stress, solving daily problems, and resolving conflicts effectively.  Strategies: Use modeling and role-playing to help recognize negative thought patterns that create depressive feelings and depressive actions, interrupt them and replace with more positive reality-based thoughts that do not support depression.  Progress;  Patient in today reporting anxiety, some depression, less patience, some anger, irritability, and some sleep difficulty recently. Husband is actually still doing some better in helping do things at home. Accumulated with situations at their townhome, stress within the family, some financial stress and husband does have pending disability, some stress with [name] at times although he is better in some ways.  She and husband have recently returned to the [employer] boxing class especially for Parkinson's patients, providing good exercise and fun.  Patient has been very involved in several extra things at church that have also added to her stress level. Needing to take better care of herself especially during times of high stress. Reviewed strategies for better self care based on what has helped her before, including slow controlled deep breathing exercises, getting outside with dog, walking, playing games with husband, reading, music, talking with friends, audiobooks. Setting healthy limits was encouraged, in addition to "staying more in the present, looking more for positives versus negatives, and focusing more on what she can control versus what she can't. Did good job talking through multiple stressors today and better understanding how those stressors (un-addressed) lead to frustration, anger, and irritability. Definitely more grounded by session end and enjoyed a  laugh before leaving office.   Goal review and progress/challenges/efforts noted with patient.  Next appt within 2 weeks.   Anna Ace,  LCSW

## 2019-08-07 ENCOUNTER — Encounter: Payer: Self-pay | Admitting: Psychiatry

## 2019-08-07 ENCOUNTER — Ambulatory Visit (INDEPENDENT_AMBULATORY_CARE_PROVIDER_SITE_OTHER): Payer: Medicare Other | Admitting: Psychiatry

## 2019-08-07 ENCOUNTER — Other Ambulatory Visit: Payer: Self-pay

## 2019-08-07 VITALS — BP 102/62 | HR 80

## 2019-08-07 DIAGNOSIS — F5101 Primary insomnia: Secondary | ICD-10-CM | POA: Diagnosis not present

## 2019-08-07 DIAGNOSIS — F332 Major depressive disorder, recurrent severe without psychotic features: Secondary | ICD-10-CM

## 2019-08-07 DIAGNOSIS — F411 Generalized anxiety disorder: Secondary | ICD-10-CM

## 2019-08-07 MED ORDER — LAMOTRIGINE 25 MG PO TABS: ORAL_TABLET | ORAL | 0 refills | Status: DC
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
## 2019-08-14 ENCOUNTER — Other Ambulatory Visit: Payer: Self-pay

## 2019-08-14 ENCOUNTER — Ambulatory Visit (INDEPENDENT_AMBULATORY_CARE_PROVIDER_SITE_OTHER): Payer: Medicare Other | Admitting: Psychiatry

## 2019-08-14 DIAGNOSIS — F411 Generalized anxiety disorder: Secondary | ICD-10-CM | POA: Diagnosis not present

## 2019-08-14 NOTE — Progress Notes (Signed)
[facility] Counselor/Therapist Progress Note  Patient ID: [name], MRN: [id number],    Date: 08/14/2019  Time Spent: 60 minutes   11:00am to 12:00noon    Treatment Type: Individual Therapy  Reported Symptoms: anxiety, some depression but some better, tearful episodes that occurred some since last appt have stopped  Mental Status Exam:  Appearance:   Neat     Behavior:  Appropriate, Sharing and Motivated  Motor:  Normal  Speech/Language:   Normal Rate  Affect:  anxiety, some depression  Mood:  anxious and depressed  Thought process:  goal directed  Thought content:    WNL  Sensory/Perceptual disturbances:    WNL  Orientation:  oriented to person, place, time/date, situation, day of week, month of year and year  Attention:  Good  Concentration:  Good  Memory:  some forgetfulness "and worse when stressed"  Fund of knowledge:   Good  Insight:    Good  Judgment:   Good  Impulse Control:  Good   Risk Assessment: Danger to Self:  No Self-injurious Behavior: No Danger to Others: No Duty to Warn:no Physical Aggression / Violence:No  Access to Firearms a concern: No  Gang Involvement:No   Subjective: Patient today reporting anxiety, some depression, but "better than the past few days as my meds have been changed some. Tearfulness stopped."   Interventions: Cognitive Behavioral Therapy and Solution-Oriented/Positive Psychology  Diagnosis:   ICD-10-CM   1. Generalized anxiety disorder  F41.1      Plan: Patient not signing tx plan updates on computer screen due to [place].  Treatment goals: Goals may remain on tx plan as patient works on strategies to meet her goals.Progress is noted every session in the "Progress" section of Plan.  Long term goal: Develop healthy cognitive patterns and beliefs about self and the world that lead to alleviation of depression and anxiety, and help prevent relapse of depression and anxiety.  Short term goal: Learn and  implement personal skills for managing stress, solving daily problems, and resolving conflicts effectively.  Strategies: Use modeling and role-playing to help recognize negative thought patterns that create depressive feelings and depressive actions, interrupt them and replace with more positive reality-based thoughts that do not support depression.  Progress; Patient in today reporting some relief from recent increase of anxiety, depression, and tearfulness. Saw med provider and meds were adjusted as noted in chart, and patient has noticed improvement mostly in decreased depression and tearfulness. Still having anxiety but depression has eased off. Anxiety reported is more generalized.  Struggling with her husband's illness (Parkinsons), relationship with daughters have challenges (althought better), and some issues within their townhome community more recently that have fuel her anxiety and frustrations but she thinks that is leveling out some with most other residents. Still stressed/anxious re: husband's pending disability and finances. Recently patient has been more involved in some teams at her church which at times has been quite stressful and that has not ended and they have a new minister, and patient's stress in that area has significantly decreased. Encouraged to give herself a break and set healthy limits with commitments. Also encouraged to continue some of her self-care/calming activities including audiobooks, walking her dog, playing games with husband, being in contact with friends, and reading.  To look for positives versus negatives and focus more on what she can control versus what she can't, and work to be more positive in her self-talk.  Goal review and progress noted with patient.  Next  appt within 2 weeks.   Shanon Ace, LCSW

## 2019-08-16 ENCOUNTER — Ambulatory Visit: Payer: Self-pay | Admitting: Obstetrics and Gynecology

## 2019-08-16 DIAGNOSIS — M545 Low back pain: Secondary | ICD-10-CM | POA: Diagnosis not present

## 2019-08-16 DIAGNOSIS — M25552 Pain in left hip: Secondary | ICD-10-CM | POA: Diagnosis not present

## 2019-08-28 ENCOUNTER — Ambulatory Visit (INDEPENDENT_AMBULATORY_CARE_PROVIDER_SITE_OTHER): Payer: Medicare Other | Admitting: Psychiatry

## 2019-08-28 ENCOUNTER — Other Ambulatory Visit: Payer: Self-pay

## 2019-08-28 DIAGNOSIS — F411 Generalized anxiety disorder: Secondary | ICD-10-CM

## 2019-08-28 NOTE — Progress Notes (Signed)
      [facility] Counselor/Therapist Progress Note  Patient ID: [name], MRN: [id number],    Date: 08/28/2019  Time Spent:  60 minutes   11:00am to 12:00noon  Treatment Type: Individual Therapy  Reported Symptoms: anxiety, depression as "gotten better, not a lot but I am better", angry at husband "more easily" but "I think it's when I'm more anxious that I'm easily angered"  Mental Status Exam:  Appearance:   Casual     Behavior:  Appropriate and Sharing  Motor:  Normal  Speech/Language:   Normal Rate  Affect:  anxious, some depression (but better)  Mood:  anxious and depressed  Thought process:  normal  Thought content:    WNL  Sensory/Perceptual disturbances:    WNL  Orientation:  oriented to person, place, time/date, situation, day of week, month of year and year  Attention:  Good  Concentration:  Good  Memory:  some forgetfulness   Fund of knowledge:   Good  Insight:    Good  Judgment:   Good  Impulse Control:  Good   Risk Assessment: Danger to Self:  No Self-injurious Behavior: No Danger to Others: No Duty to Warn:no Physical Aggression / Violence:No  Access to Firearms a concern: No  Gang Involvement:No   Subjective: Patient today reports "some up and down with anger, anxiety, and depression" but not feeling that as much now, but more "stressed, anxious, and some depression."  Depression has decreased some.    Interventions: Cognitive Behavioral Therapy and Solution-Oriented/Positive Psychology  Diagnosis:   ICD-10-CM   1. Generalized anxiety disorder  F41.1     Plan: Patient not signing tx plan updates on computer screen due to [place].  Treatment goals: Goals may remain on tx plan as patient works on strategies to meet her goals.Progress is noted every session in the "Progress" section of Plan.  Long term goal: Develop healthy cognitive patterns and beliefs about self and the world that lead to alleviation of depression and anxiety, and help  prevent relapse of depression and anxiety.  Short term goal: Learn and implement personal skills for managing stress, solving daily problems, and resolving conflicts effectively.  Strategies: Use modelingandrole-playing to help recognize negative thought patterns that create depressive feelings and depressive actions, interrupt them and replace with more positive reality-based thoughts that do not support depression.  Progress; Patient in today reporting anxiety and depression, with some decrease in depression. Some relationship issues with daughter [name], and hurtful/untrue things being said.  Did set some clear boundaries with daughter. Also been more on edge and stressed with husband but feels she is having some less stress and anxiety at church with her responsibilities.  Enjoying the [employer] boxing classes for  Parkinson's patients with her husband and is helping to plan a special event for the patients  there 09/22/2019. Focusing on short term goal above of learning and implementing personal skills for managing stress, solving daily problems, and resolving conflicts effectively especially with husband and to continue work with this between sessions. Encouraged her to continue intentionally looking for positives versus negatives and remembering to use more positive self-talk.  Goal review and progress noted with patient.  Next  Session within 2 weeks.   Shanon Ace, LCSW

## 2019-08-29 ENCOUNTER — Other Ambulatory Visit: Payer: Self-pay

## 2019-08-29 DIAGNOSIS — N952 Postmenopausal atrophic vaginitis: Secondary | ICD-10-CM

## 2019-08-29 DIAGNOSIS — Z01419 Encounter for gynecological examination (general) (routine) without abnormal findings: Secondary | ICD-10-CM

## 2019-08-29 MED ORDER — ESTRADIOL 10 MCG VA TABS: 1.0000 | ORAL_TABLET | VAGINAL | 1 refills | Status: DC

## 2019-08-29 NOTE — Telephone Encounter (Signed)
Medication refill request: estradiol 57mcg vaginal tablet Last AEX:  08-15-2018 Next AEX: 10-11-2019 Last MMG (if hormonal medication request): 09-20-2018 category c density birads 2:neg Refill authorized: please approve until aex if appropriate.

## 2019-08-30 ENCOUNTER — Ambulatory Visit (INDEPENDENT_AMBULATORY_CARE_PROVIDER_SITE_OTHER): Payer: Medicare Other | Admitting: Psychiatry

## 2019-08-30 ENCOUNTER — Other Ambulatory Visit: Payer: Self-pay

## 2019-08-30 ENCOUNTER — Encounter: Payer: Self-pay | Admitting: Psychiatry

## 2019-08-30 DIAGNOSIS — F5101 Primary insomnia: Secondary | ICD-10-CM | POA: Diagnosis not present

## 2019-08-30 DIAGNOSIS — F330 Major depressive disorder, recurrent, mild: Secondary | ICD-10-CM

## 2019-08-30 DIAGNOSIS — F411 Generalized anxiety disorder: Secondary | ICD-10-CM

## 2019-08-30 MED ORDER — LAMOTRIGINE 100 MG PO TABS: ORAL_TABLET | ORAL | 0 refills | Status: DC
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
## 2019-08-31 ENCOUNTER — Other Ambulatory Visit: Payer: Self-pay | Admitting: Psychiatry

## 2019-08-31 ENCOUNTER — Other Ambulatory Visit: Payer: Self-pay | Admitting: Obstetrics and Gynecology

## 2019-08-31 DIAGNOSIS — F332 Major depressive disorder, recurrent severe without psychotic features: Secondary | ICD-10-CM

## 2019-09-05 NOTE — Telephone Encounter (Signed)
Medication refill request: Valtrex Last AEX:  08/15/18  Next AEX: 10/11/19 Last MMG (if hormonal medication request): 09/20/18 Bi-rads 2 benign  Refill authorized: #30 with 0 RF

## 2019-09-11 ENCOUNTER — Ambulatory Visit (INDEPENDENT_AMBULATORY_CARE_PROVIDER_SITE_OTHER): Payer: Medicare Other | Admitting: Psychiatry

## 2019-09-11 ENCOUNTER — Other Ambulatory Visit: Payer: Self-pay

## 2019-09-11 DIAGNOSIS — F411 Generalized anxiety disorder: Secondary | ICD-10-CM | POA: Diagnosis not present

## 2019-09-11 NOTE — Progress Notes (Signed)
      [facility] Counselor/Therapist Progress Note  Patient ID: [name], MRN: [id number],    Date: 09/11/2019  Time Spent: 60 minutes   12:00noon to 1:00pm  Treatment Type: Individual Therapy  Reported Symptoms: anxiety, depression has decreased   Mental Status Exam:  Appearance:   Well Groomed     Behavior:  Appropriate, Sharing and Motivated  Motor:  Normal  Speech/Language:   Normal Rate  Affect:  anxious  Mood:  anxious and some depression but better  Thought process:  normal  Thought content:    WNL  Sensory/Perceptual disturbances:    WNL  Orientation:  oriented to person, place, time/date, situation, day of week, month of year and year  Attention:  Good  Concentration:  Good  Memory:  some forgetfulness  Fund of knowledge:   Good  Insight:    Good  Judgment:   Good  Impulse Control:  Good   Risk Assessment: Danger to Self:  No Self-injurious Behavior: No Danger to Others: No Duty to Warn:no Physical Aggression / Violence:No  Access to Firearms a concern: No  Gang Involvement:No   Subjective:  Patient today reports anxiety which feels more generalized.  Staying busy with her church and in assisting her husband who is a Parkinson's patient.  Interventions: Cognitive Behavioral Therapy and Solution-Oriented/Positive Psychology  Diagnosis:   ICD-10-CM   1. Generalized anxiety disorder  F41.1      Plan: Patient not signing tx plan updates on computer screen due to [place].  Treatment goals: Goals may remain on tx plan as patient works on strategies to meet her goals.Progress is noted every session in the "Progress" section of Plan.  Long term goal: Develop healthy cognitive patterns and beliefs about self and the world that lead to alleviation of depression and anxiety, and help prevent relapse of depression and anxiety.  Short term goal: Learn and implement personal skills for managing stress, solving daily problems, and resolving conflicts  effectively.  Strategies: Use modelingandrole-playing to help recognize negative thought patterns that create depressive feelings and depressive actions, interrupt them and replace with more positive reality-based thoughts that do not support depression.  Progress; Patient reports anxiety and easily overwhelmed but the overwhelmedness is decreasing some. Depression has "definitely decreased". Some issues she is needing to deal with that relate mostly to her daughter "A", about certain expectations and things that have been done and said within past few weeks and during last visit together. Discussed how she can say what she wants to say in a helpful way that might be received well.  Practiced this is session today which helped her feel better ready to address some issues with daughter in a proactive but positive manner, and still keep healthy boundaries. Needs to continue some focus on goal above re: learniing and implementing personal skills to better manage stress, solving daily problems, setting healthy boundaries, and to resolve conflict within family more effectively. Some improvements in self talk being more positive but needs consistency.  Goal review and progress noted with patient.  Next appt within 3 weeks.   Shanon Ace, LCSW

## 2019-09-13 ENCOUNTER — Other Ambulatory Visit: Payer: Self-pay | Admitting: Psychiatry

## 2019-09-13 DIAGNOSIS — F411 Generalized anxiety disorder: Secondary | ICD-10-CM

## 2019-09-13 DIAGNOSIS — F330 Major depressive disorder, recurrent, mild: Secondary | ICD-10-CM

## 2019-09-25 ENCOUNTER — Other Ambulatory Visit: Payer: Self-pay | Admitting: Psychiatry

## 2019-09-25 ENCOUNTER — Ambulatory Visit (INDEPENDENT_AMBULATORY_CARE_PROVIDER_SITE_OTHER): Payer: Medicare Other | Admitting: Psychiatry

## 2019-09-25 ENCOUNTER — Other Ambulatory Visit: Payer: Self-pay

## 2019-09-25 DIAGNOSIS — F330 Major depressive disorder, recurrent, mild: Secondary | ICD-10-CM

## 2019-09-25 DIAGNOSIS — F411 Generalized anxiety disorder: Secondary | ICD-10-CM | POA: Diagnosis not present

## 2019-09-25 DIAGNOSIS — F332 Major depressive disorder, recurrent severe without psychotic features: Secondary | ICD-10-CM

## 2019-09-25 NOTE — Progress Notes (Signed)
[facility] Counselor/Therapist Progress Note  Patient ID: [name], MRN: [id number],    Date: 09/25/2019  Time Spent: 60 minutes   12:00noon to 1:00pm  Treatment Type: Individual Therapy  Reported Symptoms: anxiety, stressed, depression ("not as bad"), tearfulness, "I do try to look for positives sometimes"  Mental Status Exam:  Appearance:   Neat     Behavior:  Appropriate, Sharing and Motivated  Motor:  Normal  Speech/Language:   Clear and Coherent  Affect:  anxious, stressed, depressed  Mood:  anxious, depressed and some tearfulness  Thought process:  goal directed  Thought content:    WNL  Sensory/Perceptual disturbances:    WNL  Orientation:  oriented to person, place, time/date, situation, day of week, month of year and year  Attention:  Good  Concentration:  Fair  Memory:  forgetfulness and Dr. is aware  Fund of knowledge:   Good  Insight:    Good  Judgment:   Good  Impulse Control:  Good   Risk Assessment: Danger to Self:  No Self-injurious Behavior: No Danger to Others: No Duty to Warn:no Physical Aggression / Violence:No  Access to Firearms a concern: No  Gang Involvement:No   Subjective: Patient today reporting anxiety, increased stress, some depression and tearfulness due to recent factors including husband's health, Issues within family, financial stressors.   Interventions: Cognitive Behavioral Therapy and Solution-Oriented/Positive Psychology  Diagnosis:   ICD-10-CM   1. Generalized anxiety disorder  F41.1     Plan: Patient not signing tx plan updates on computer screen due to [place].  Treatment goals: Goals may remain on tx plan as patient works on strategies to meet her goals.Progress is noted every session in the "Progress" section of Plan.  Long term goal: Develop healthy cognitive patterns and beliefs about self and the world that lead to alleviation of depression and anxiety, and help prevent relapse of depression and  anxiety.  Short term goal: Learn and implement personal skills for managing stress, solving daily problems, and resolving conflicts effectively.  Strategies: Use modelingandrole-playing to help recognize negative thought patterns that create depressive feelings and depressive actions, interrupt them and replace with more positive reality-based thoughts that do not support depression.  Progress; Patient in today with increased anxiety, decreased depression, stressed, tearfulness.  States she tries to notice any positives but hard time with that lately.  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 Patient processing today her multiple stressors as noted above and feeling overwhelmed by some and hurt by others. Looked at what she can control and what she can't control, and trying to let go negative interactions with others. Also discussed some limit-setting needed by her to better care for herself. Still working with the relationship with adult daughter "A", which continues to have some ups and downs. Concerned about husband's pending disability application. Trying to use better skills in managing stress, having healthier boundaries, solving daily problems, and being able to manage family conflict more effectively and without self-blame. Is showing improvements in some coping skills but when she gets overwhelmed and has been as self-caring, things feel less manageable.  Did well in session today and seems to feel more grounded and some empowerment by session end.    Goal review and progress noted with patient.  Next appt within 3  weeks.   Shanon Ace, LCSW

## 2019-09-26 NOTE — Telephone Encounter (Signed)
Apt tomorrow

## 2019-09-27 ENCOUNTER — Other Ambulatory Visit: Payer: Self-pay

## 2019-09-27 ENCOUNTER — Encounter: Payer: Self-pay | Admitting: Psychiatry

## 2019-09-27 ENCOUNTER — Other Ambulatory Visit: Payer: Self-pay | Admitting: Psychiatry

## 2019-09-27 ENCOUNTER — Ambulatory Visit (INDEPENDENT_AMBULATORY_CARE_PROVIDER_SITE_OTHER): Payer: Medicare Other | Admitting: Psychiatry

## 2019-09-27 DIAGNOSIS — F332 Major depressive disorder, recurrent severe without psychotic features: Secondary | ICD-10-CM

## 2019-09-27 DIAGNOSIS — F411 Generalized anxiety disorder: Secondary | ICD-10-CM

## 2019-09-27 DIAGNOSIS — F5101 Primary insomnia: Secondary | ICD-10-CM | POA: Diagnosis not present

## 2019-09-27 DIAGNOSIS — F330 Major depressive disorder, recurrent, mild: Secondary | ICD-10-CM

## 2019-09-27 MED ORDER — BUSPIRONE HCL 30 MG PO TABS: 15.0000 mg | ORAL_TABLET | Freq: Two times a day (BID) | ORAL | 0 refills | Status: DC

## 2019-09-27 MED ORDER — LAMOTRIGINE 150 MG PO TABS: ORAL_TABLET | ORAL | 0 refills | Status: DC
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
## 2019-09-28 MED ORDER — LAMOTRIGINE 150 MG PO TABS: 150.0000 mg | ORAL_TABLET | Freq: Every day | ORAL | 0 refills | Status: DC

## 2019-09-29 DIAGNOSIS — Z1231 Encounter for screening mammogram for malignant neoplasm of breast: Secondary | ICD-10-CM | POA: Diagnosis not present

## 2019-09-30 ENCOUNTER — Other Ambulatory Visit: Payer: Self-pay | Admitting: Cardiovascular Disease

## 2019-09-30 ENCOUNTER — Other Ambulatory Visit: Payer: Self-pay | Admitting: Psychiatry

## 2019-09-30 DIAGNOSIS — F330 Major depressive disorder, recurrent, mild: Secondary | ICD-10-CM

## 2019-09-30 DIAGNOSIS — F332 Major depressive disorder, recurrent severe without psychotic features: Secondary | ICD-10-CM

## 2019-09-30 DIAGNOSIS — F411 Generalized anxiety disorder: Secondary | ICD-10-CM

## 2019-10-06 DIAGNOSIS — M25562 Pain in left knee: Secondary | ICD-10-CM | POA: Diagnosis not present

## 2019-10-09 ENCOUNTER — Ambulatory Visit (INDEPENDENT_AMBULATORY_CARE_PROVIDER_SITE_OTHER): Payer: Medicare Other | Admitting: Psychiatry

## 2019-10-09 ENCOUNTER — Other Ambulatory Visit: Payer: Self-pay

## 2019-10-09 DIAGNOSIS — F411 Generalized anxiety disorder: Secondary | ICD-10-CM | POA: Diagnosis not present

## 2019-10-09 NOTE — Progress Notes (Signed)
      [facility] Counselor/Therapist Progress Note  Patient ID: [name], MRN: [id number],    Date: 10/09/2019  Time Spent: 60 mintues  10:55am to 11:55am  Treatment Type: Individual Therapy  Reported Symptoms: anxiety, depression  Mental Status Exam:  Appearance:   Casual     Behavior:  Appropriate, Sharing and Motivated  Motor:  Normal  Speech/Language:   Clear and Coherent  Affect:  anxious, depression (lessened)  Mood:  anxious and depressed  Thought process:  normal  Thought content:    WNL  Sensory/Perceptual disturbances:    WNL  Orientation:  oriented to person, place, time/date, situation, day of week, month of year and year  Attention:  Good  Concentration:  Good  Memory:  forgets easily especially under stress  Fund of knowledge:   Good  Insight:    Good  Judgment:   Good  Impulse Control:  Good   Risk Assessment: Danger to Self:  No Self-injurious Behavior: No Danger to Others: No Duty to Warn:no Physical Aggression / Violence:No  Access to Firearms a concern: No  Gang Involvement:No   Subjective: Patient today reporting anxiety and some depression although it has decreased some more recently.   Interventions: Solution-Oriented/Positive Psychology and Ego-Supportive  Diagnosis:   ICD-10-CM   1. Generalized anxiety disorder  F41.1      Plan: Patient not signing tx plan updates on computer screen due to [name].  Treatment goals: Goals may remain on tx plan as patient works on strategies to meet her goals.Progress is noted every session in the "Progress" section of Plan.  Long term goal: Develop healthy cognitive patterns and beliefs about self and the world that lead to alleviation of depression and anxiety, and help prevent relapse of depression and anxiety.  Short term goal: Learn and implement personal skills for managing stress, solving daily problems, and resolving conflicts effectively.  Strategies: Use  modelingandrole-playing to help recognize negative thought patterns that create depressive feelings and depressive actions, interrupt them and replace with more positive reality-based thoughts that do not support depression.  Progress; Patient in today reporting anxiety and depression (decreased some recenlty). Less tearful more recently than when at her last appt. Is still stressed over husband's ongoing disability situation, and also problems within her townhome community. Processed the issues she is having in her neighborhood and with one neighbor in particular. Prior issues with daughter [name] have improved some.  Financial stressors have increased, creating more stress for her, as well as concerns about daughter "M".  Not feeling quite as overwhelmed today compared to last session.  Continues to work on "what I can control versus what I can't, and also trying to stop the self-blame, establish healthier boundaries in certain relationships, and trying to be mindful of increasing her self-care (emotionally and physically). Reports feeling some more hopefulness about the future and trying to give more attention to the positives versus the negatives.  Goal review and progress noted with patient.  Next appt within 2-3    Shanon Ace, LCSW

## 2019-10-11 ENCOUNTER — Ambulatory Visit: Payer: Self-pay | Admitting: Obstetrics and Gynecology

## 2019-10-11 DIAGNOSIS — M47816 Spondylosis without myelopathy or radiculopathy, lumbar region: Secondary | ICD-10-CM | POA: Diagnosis not present

## 2019-10-11 DIAGNOSIS — M545 Low back pain: Secondary | ICD-10-CM | POA: Diagnosis not present

## 2019-10-12 DIAGNOSIS — J342 Deviated nasal septum: Secondary | ICD-10-CM | POA: Diagnosis not present

## 2019-10-12 DIAGNOSIS — J300 Vasomotor rhinitis: Secondary | ICD-10-CM | POA: Diagnosis not present

## 2019-10-12 DIAGNOSIS — J3489 Other specified disorders of nose and nasal sinuses: Secondary | ICD-10-CM | POA: Diagnosis not present

## 2019-10-12 DIAGNOSIS — J328 Other chronic sinusitis: Secondary | ICD-10-CM | POA: Diagnosis not present

## 2019-10-12 DIAGNOSIS — J343 Hypertrophy of nasal turbinates: Secondary | ICD-10-CM | POA: Diagnosis not present

## 2019-10-12 DIAGNOSIS — J329 Chronic sinusitis, unspecified: Secondary | ICD-10-CM | POA: Diagnosis not present

## 2019-10-19 ENCOUNTER — Encounter: Payer: Self-pay | Admitting: Obstetrics and Gynecology

## 2019-10-19 DIAGNOSIS — R922 Inconclusive mammogram: Secondary | ICD-10-CM | POA: Diagnosis not present

## 2019-10-23 ENCOUNTER — Other Ambulatory Visit: Payer: Self-pay

## 2019-10-23 ENCOUNTER — Ambulatory Visit (INDEPENDENT_AMBULATORY_CARE_PROVIDER_SITE_OTHER): Payer: Medicare Other | Admitting: Psychiatry

## 2019-10-23 DIAGNOSIS — F411 Generalized anxiety disorder: Secondary | ICD-10-CM
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
## 2019-10-24 ENCOUNTER — Other Ambulatory Visit: Payer: Self-pay | Admitting: Psychiatry

## 2019-10-24 DIAGNOSIS — F411 Generalized anxiety disorder: Secondary | ICD-10-CM

## 2019-10-26 DIAGNOSIS — M5116 Intervertebral disc disorders with radiculopathy, lumbar region: Secondary | ICD-10-CM | POA: Diagnosis not present

## 2019-10-26 DIAGNOSIS — M5416 Radiculopathy, lumbar region: Secondary | ICD-10-CM | POA: Diagnosis not present

## 2019-10-30 ENCOUNTER — Other Ambulatory Visit: Payer: Self-pay | Admitting: Psychiatry

## 2019-10-30 DIAGNOSIS — F411 Generalized anxiety disorder: Secondary | ICD-10-CM

## 2019-10-30 DIAGNOSIS — F332 Major depressive disorder, recurrent severe without psychotic features: Secondary | ICD-10-CM

## 2019-10-30 DIAGNOSIS — F330 Major depressive disorder, recurrent, mild: Secondary | ICD-10-CM

## 2019-11-01 ENCOUNTER — Other Ambulatory Visit: Payer: Self-pay | Admitting: Psychiatry

## 2019-11-01 DIAGNOSIS — F411 Generalized anxiety disorder: Secondary | ICD-10-CM

## 2019-11-01 DIAGNOSIS — F332 Major depressive disorder, recurrent severe without psychotic features: Secondary | ICD-10-CM

## 2019-11-01 DIAGNOSIS — F330 Major depressive disorder, recurrent, mild: Secondary | ICD-10-CM

## 2019-11-03 DIAGNOSIS — E039 Hypothyroidism, unspecified: Secondary | ICD-10-CM | POA: Diagnosis not present

## 2019-11-03 DIAGNOSIS — M810 Age-related osteoporosis without current pathological fracture: Secondary | ICD-10-CM | POA: Diagnosis not present

## 2019-11-03 DIAGNOSIS — E785 Hyperlipidemia, unspecified: Secondary | ICD-10-CM | POA: Diagnosis not present

## 2019-11-07 ENCOUNTER — Ambulatory Visit (INDEPENDENT_AMBULATORY_CARE_PROVIDER_SITE_OTHER): Payer: Medicare Other | Admitting: Psychiatry

## 2019-11-07 ENCOUNTER — Other Ambulatory Visit: Payer: Self-pay

## 2019-11-07 DIAGNOSIS — F411 Generalized anxiety disorder: Secondary | ICD-10-CM

## 2019-11-07 NOTE — Progress Notes (Signed)
      [facility] Counselor/Therapist Progress Note  Patient ID: [name], MRN: [id number],    Date: 11/07/2019  Time Spent: 60 minutes 3:00pm to 4:00pm  Treatment Type: Individual Therapy  Reported Symptoms: anxiety, depression "with anxiety being stronger".  Mental Status Exam:  Appearance:   Casual     Behavior:  Appropriate and Sharing  Motor:  Normal  Speech/Language:   Clear and Coherent  Affect:  anxious, depressed  Mood:  anxious and depressed  Thought process:  normal  Thought content:    WNL  Sensory/Perceptual disturbances:    WNL  Orientation:  oriented to person, place, time/date, situation, day of week, month of year and year  Attention:  Fair  Concentration:  Good and Fair  Memory:  forgetfulness and worse under stress  Fund of knowledge:   Good  Insight:    Good and Fair  Judgment:   Good  Impulse Control:  Good   Risk Assessment: Danger to Self:  No Self-injurious Behavior: No Danger to Others: No Duty to Warn:no Physical Aggression / Violence:No  Access to Firearms a concern: No  Gang Involvement:No   Subjective: Patient today sharing that her anxiety and depression are both increased some , with the anxiety being stronger.  Interventions: Cognitive Behavioral Therapy and Solution-Oriented/Positive Psychology  Diagnosis:   ICD-10-CM   1. Generalized anxiety disorder  F41.1     Plan: Patient not signing tx plan updates on computer screen due to [place].  Treatment goals: Goals may remain on tx plan as patient works on strategies to meet her goals.Progress is noted every session in the "Progress" section of Plan.  Long term goal: Develop healthy cognitive patterns and beliefs about self and the world that lead to alleviation of depression and anxiety, and help prevent relapse of depression and anxiety.  Short term goal: Learn and implement personal skills for managing stress, solving daily problems, and resolving conflicts  effectively.  Strategies: Use modelingandrole-playing to help recognize negative thought patterns that create depressive feelings and depressive actions, interrupt them and replace with more positive reality-based thoughts that do not support depression.  Progress; Patient today states her anxiety and depression have increased some, with the anxiety being stronger. Some increased family pressures more recently that have contributed to increased anxiety and depression.  Concerned re: husband's Parkinson's symptoms "but he's not necessarily worse, but symptoms seemed some worse recently". Patient very involve in her church and that seems to help her mood at times of stronger anxiety and/or depression. Some personal issues between patient and husband (not all details in note here).  Reviewed her goals especially short term goal and she is to work on this more between session.  Encouraged her self-care to be more positive, and including staying in the present, stop her tendency to self-blame, have better boundaries with others as needed, and improve self-talk to be more self-affirming.   Goal review and progress noted with patient.  Next appt within 2-3 weeks.   Shanon Ace, LCSW

## 2019-11-10 ENCOUNTER — Telehealth: Payer: Self-pay | Admitting: Cardiovascular Disease

## 2019-11-10 DIAGNOSIS — R82998 Other abnormal findings in urine: Secondary | ICD-10-CM | POA: Diagnosis not present

## 2019-11-10 DIAGNOSIS — E039 Hypothyroidism, unspecified: Secondary | ICD-10-CM | POA: Diagnosis not present

## 2019-11-10 DIAGNOSIS — M5380 Other specified dorsopathies, site unspecified: Secondary | ICD-10-CM | POA: Diagnosis not present

## 2019-11-10 DIAGNOSIS — E7849 Other hyperlipidemia: Secondary | ICD-10-CM | POA: Diagnosis not present

## 2019-11-10 DIAGNOSIS — G43009 Migraine without aura, not intractable, without status migrainosus: Secondary | ICD-10-CM | POA: Diagnosis not present

## 2019-11-10 DIAGNOSIS — G4733 Obstructive sleep apnea (adult) (pediatric): Secondary | ICD-10-CM | POA: Diagnosis not present

## 2019-11-10 DIAGNOSIS — F0390 Unspecified dementia without behavioral disturbance: Secondary | ICD-10-CM | POA: Diagnosis not present

## 2019-11-10 DIAGNOSIS — M810 Age-related osteoporosis without current pathological fracture: Secondary | ICD-10-CM | POA: Diagnosis not present

## 2019-11-10 DIAGNOSIS — Z0000 Encounter for general adult medical examination without abnormal findings: Secondary | ICD-10-CM | POA: Diagnosis not present

## 2019-11-10 DIAGNOSIS — F329 Major depressive disorder, single episode, unspecified: Secondary | ICD-10-CM | POA: Diagnosis not present

## 2019-11-10 DIAGNOSIS — M797 Fibromyalgia: Secondary | ICD-10-CM | POA: Diagnosis not present

## 2019-11-10 DIAGNOSIS — R202 Paresthesia of skin: Secondary | ICD-10-CM | POA: Diagnosis not present

## 2019-11-10 DIAGNOSIS — M1990 Unspecified osteoarthritis, unspecified site: Secondary | ICD-10-CM | POA: Diagnosis not present

## 2019-11-10 DIAGNOSIS — Z1212 Encounter for screening for malignant neoplasm of rectum: Secondary | ICD-10-CM | POA: Diagnosis not present

## 2019-11-10 NOTE — Telephone Encounter (Signed)
Pt reports pain in her upper back, stomach and chest, states it goes up her neck and into her ears.   States it is more in her upper stomach, under the rib cage.  She has experienced some pain that started b/t her shoulder blades and then radiating towards chest. Pain level is 4/5 at worst. Pt feels like it is esophageal spasm issues, but when she called that MD they told her to go to ED/call cardiology. She has nitro, but has not taken any. Pt advised that she should go to ED for evaluation, but pt does not want to due to current covid issues.  Advised that she should go be evaluated but that if she chooses not to go, then monitor and if pain reoccurs/changes in nature/worsens to go immediately to ED/call 911. Pt agreeable to plan.

## 2019-11-10 NOTE — Telephone Encounter (Signed)
     Pt c/o of Chest Pain: STAT if CP now or developed within 24 hours  1. Are you having CP right now? Yes  2. Are you experiencing any other symptoms (ex. SOB, nausea, vomiting, sweating)?   3. How long have you been experiencing CP? Started last night  4. Is your CP continuous or coming and going? Coming and going  5. Have you taken Nitroglycerin? Yes  Pt said last night she woke up because she's feeling pain behind her back to her upper stomach and pain go up to her neck and ears, she took nitro and it went away but now she is feeling it again

## 2019-11-16 DIAGNOSIS — J329 Chronic sinusitis, unspecified: Secondary | ICD-10-CM | POA: Diagnosis not present

## 2019-11-16 DIAGNOSIS — J342 Deviated nasal septum: Secondary | ICD-10-CM | POA: Diagnosis not present

## 2019-11-16 DIAGNOSIS — J320 Chronic maxillary sinusitis: Secondary | ICD-10-CM | POA: Diagnosis not present

## 2019-11-16 DIAGNOSIS — J328 Other chronic sinusitis: Secondary | ICD-10-CM | POA: Diagnosis not present

## 2019-11-16 DIAGNOSIS — J343 Hypertrophy of nasal turbinates: Secondary | ICD-10-CM | POA: Diagnosis not present

## 2019-11-16 DIAGNOSIS — J300 Vasomotor rhinitis: Secondary | ICD-10-CM | POA: Diagnosis not present

## 2019-11-16 DIAGNOSIS — J3489 Other specified disorders of nose and nasal sinuses: Secondary | ICD-10-CM | POA: Diagnosis not present

## 2019-11-17 DIAGNOSIS — K219 Gastro-esophageal reflux disease without esophagitis: Secondary | ICD-10-CM | POA: Diagnosis not present

## 2019-11-17 DIAGNOSIS — Z79899 Other long term (current) drug therapy: Secondary | ICD-10-CM | POA: Diagnosis not present

## 2019-11-17 DIAGNOSIS — R0789 Other chest pain: Secondary | ICD-10-CM | POA: Diagnosis not present

## 2019-11-20 ENCOUNTER — Ambulatory Visit: Payer: PRIVATE HEALTH INSURANCE | Admitting: Psychiatry

## 2019-11-20 DIAGNOSIS — R0789 Other chest pain: Secondary | ICD-10-CM | POA: Diagnosis not present

## 2019-11-20 DIAGNOSIS — K219 Gastro-esophageal reflux disease without esophagitis: Secondary | ICD-10-CM | POA: Diagnosis not present

## 2019-11-20 DIAGNOSIS — K222 Esophageal obstruction: Secondary | ICD-10-CM | POA: Diagnosis not present

## 2019-11-24 ENCOUNTER — Other Ambulatory Visit: Payer: Self-pay | Admitting: Psychiatry

## 2019-11-24 ENCOUNTER — Ambulatory Visit: Payer: Medicare Other | Admitting: Cardiovascular Disease

## 2019-11-24 DIAGNOSIS — F330 Major depressive disorder, recurrent, mild: Secondary | ICD-10-CM

## 2019-11-28 ENCOUNTER — Other Ambulatory Visit: Payer: Self-pay | Admitting: Psychiatry

## 2019-11-28 DIAGNOSIS — F332 Major depressive disorder, recurrent severe without psychotic features: Secondary | ICD-10-CM

## 2019-11-28 DIAGNOSIS — F411 Generalized anxiety disorder: Secondary | ICD-10-CM

## 2019-11-28 DIAGNOSIS — R0789 Other chest pain: Secondary | ICD-10-CM | POA: Diagnosis not present

## 2019-11-28 DIAGNOSIS — F330 Major depressive disorder, recurrent, mild: Secondary | ICD-10-CM

## 2019-11-28 DIAGNOSIS — K219 Gastro-esophageal reflux disease without esophagitis: Secondary | ICD-10-CM | POA: Diagnosis not present

## 2019-11-29 NOTE — Telephone Encounter (Signed)
Please review

## 2019-12-04 ENCOUNTER — Ambulatory Visit: Payer: PRIVATE HEALTH INSURANCE | Admitting: Psychiatry

## 2019-12-04 DIAGNOSIS — J3489 Other specified disorders of nose and nasal sinuses: Secondary | ICD-10-CM | POA: Diagnosis not present

## 2019-12-04 DIAGNOSIS — J329 Chronic sinusitis, unspecified: Secondary | ICD-10-CM | POA: Diagnosis not present

## 2019-12-04 DIAGNOSIS — J328 Other chronic sinusitis: Secondary | ICD-10-CM | POA: Diagnosis not present

## 2019-12-04 DIAGNOSIS — J320 Chronic maxillary sinusitis: Secondary | ICD-10-CM | POA: Diagnosis not present

## 2019-12-04 DIAGNOSIS — J300 Vasomotor rhinitis: Secondary | ICD-10-CM | POA: Diagnosis not present

## 2019-12-07 ENCOUNTER — Other Ambulatory Visit: Payer: Self-pay | Admitting: Psychiatry

## 2019-12-07 DIAGNOSIS — F411 Generalized anxiety disorder: Secondary | ICD-10-CM

## 2019-12-07 DIAGNOSIS — F332 Major depressive disorder, recurrent severe without psychotic features: Secondary | ICD-10-CM

## 2019-12-07 DIAGNOSIS — F330 Major depressive disorder, recurrent, mild: Secondary | ICD-10-CM

## 2019-12-11 ENCOUNTER — Other Ambulatory Visit: Payer: Self-pay

## 2019-12-11 DIAGNOSIS — B009 Herpesviral infection, unspecified: Secondary | ICD-10-CM

## 2019-12-11 NOTE — Telephone Encounter (Signed)
Patient is calling in regards to refill of Valtrex.  °

## 2019-12-11 NOTE — Telephone Encounter (Signed)
Medication refill request: Valtrex 500mg   Last AEX:  08/15/18 Next AEX: 03/26/20 Last MMG (if hormonal medication request): 09/20/18  Neg  Refill authorized: 30/1

## 2019-12-12 DIAGNOSIS — J329 Chronic sinusitis, unspecified: Secondary | ICD-10-CM | POA: Diagnosis not present

## 2019-12-12 DIAGNOSIS — Z48810 Encounter for surgical aftercare following surgery on the sense organs: Secondary | ICD-10-CM | POA: Diagnosis not present

## 2019-12-12 DIAGNOSIS — J3489 Other specified disorders of nose and nasal sinuses: Secondary | ICD-10-CM | POA: Diagnosis not present

## 2019-12-12 MED ORDER — VALACYCLOVIR HCL 500 MG PO TABS: ORAL_TABLET | ORAL | 2 refills | Status: DC

## 2019-12-15 DIAGNOSIS — Z23 Encounter for immunization: Secondary | ICD-10-CM | POA: Diagnosis not present

## 2019-12-18 ENCOUNTER — Ambulatory Visit (INDEPENDENT_AMBULATORY_CARE_PROVIDER_SITE_OTHER): Payer: Medicare Other | Admitting: Psychiatry

## 2019-12-18 ENCOUNTER — Other Ambulatory Visit: Payer: Self-pay

## 2019-12-18 DIAGNOSIS — F411 Generalized anxiety disorder: Secondary | ICD-10-CM | POA: Diagnosis not present

## 2019-12-18 NOTE — Progress Notes (Signed)
      [facility] Counselor/Therapist Progress Note  Patient ID: [name], MRN: [id number],    Date: 12/18/2019  Time Spent: 60 minutes  12:00noon to 1:00pm  Treatment Type: Individual Therapy  Reported Symptoms: anxiety, some depression but "better"  Mental Status Exam:  Appearance:   Casual     Behavior:  Appropriate, Sharing and Motivated  Motor:  Normal  Speech/Language:   Clear and Coherent  Affect:  anxious  Mood:  anxious  Thought process:  goal directed  Thought content:    WNL  Sensory/Perceptual disturbances:    WNL  Orientation:  oriented to person, place, time/date, situation, day of week, month of year and year  Attention:  Good  Concentration:  Good  Memory:  some forgetfulness and worse under stress  Fund of knowledge:   Good  Insight:    Good and Fair  Judgment:   Good  Impulse Control:  Good   Risk Assessment: Danger to Self:  No Self-injurious Behavior: No Danger to Others: No Duty to Warn:no Physical Aggression / Violence:No  Access to Firearms a concern: No  Gang Involvement:No   Subjective: Patient reports anxiety and some depression (but "some better). States she's handled things some better and able to get more things done.  Some decrease in depression.  Trying to manage stress better, and writing things down is helpful for me.   Interventions: Cognitive Behavioral Therapy, Solution-Oriented/Positive Psychology and Ego-Supportive  Diagnosis:   ICD-10-CM   1. Generalized anxiety disorder  F41.1     Plan: Patient not signing tx plan updates on computer screen due to [place].  Treatment goals: Goals may remain on tx plan as patient works on strategies to meet her goals.Progress is noted every session in the "Progress" section of Plan.  Long term goal: Develop healthy cognitive patterns and beliefs about self and the world that lead to alleviation of depression and anxiety, and help prevent relapse of depression and  anxiety.  Short term goal: Learn and implement personal skills for managing stress, solving daily problems, and resolving conflicts effectively.  Strategies: Use modelingandrole-playing to help recognize anxious/depressive/negative thought patterns that create anxious/depressive/negative feelings and actions, interrupt them and replace with more positive reality-based thoughts that do not support depression.  Progress; Patient today experiencing anxiety and depression.  Depression has decreased some. Anxiety up with family stressors, Husband's Parkinson's symptoms, and "saying No when I need to". Talked through her anxieties, and used strategy in her tx plan to work further on interrupting those thoughts in order to challenge them and replace with thoughts that more positive, reality-based, and empowering that do not support anxiety. Patient encouraged to focus on good self-care including "learning to say no and not feeling bad about it", exercise/walks, getting outside more, having good boundaries, healthy nutrition, and positive self-talk.   Goal review and progress/challenges noted with patient.  Next appt within 3 weeks.    Shanon Ace, LCSW

## 2019-12-19 ENCOUNTER — Other Ambulatory Visit: Payer: Self-pay | Admitting: Psychiatry

## 2019-12-19 ENCOUNTER — Other Ambulatory Visit: Payer: Self-pay | Admitting: Cardiovascular Disease

## 2019-12-19 DIAGNOSIS — F330 Major depressive disorder, recurrent, mild: Secondary | ICD-10-CM

## 2019-12-19 DIAGNOSIS — F411 Generalized anxiety disorder: Secondary | ICD-10-CM

## 2019-12-19 DIAGNOSIS — F332 Major depressive disorder, recurrent severe without psychotic features: Secondary | ICD-10-CM

## 2019-12-25 ENCOUNTER — Other Ambulatory Visit: Payer: Self-pay | Admitting: Psychiatry

## 2019-12-25 DIAGNOSIS — F411 Generalized anxiety disorder: Secondary | ICD-10-CM

## 2019-12-25 DIAGNOSIS — F330 Major depressive disorder, recurrent, mild: Secondary | ICD-10-CM

## 2019-12-28 ENCOUNTER — Other Ambulatory Visit: Payer: Self-pay | Admitting: Psychiatry

## 2019-12-28 DIAGNOSIS — F330 Major depressive disorder, recurrent, mild: Secondary | ICD-10-CM

## 2019-12-28 DIAGNOSIS — Z79899 Other long term (current) drug therapy: Secondary | ICD-10-CM | POA: Diagnosis not present

## 2019-12-28 DIAGNOSIS — F411 Generalized anxiety disorder: Secondary | ICD-10-CM

## 2019-12-28 DIAGNOSIS — Z48810 Encounter for surgical aftercare following surgery on the sense organs: Secondary | ICD-10-CM | POA: Diagnosis not present

## 2019-12-28 DIAGNOSIS — J3489 Other specified disorders of nose and nasal sinuses: Secondary | ICD-10-CM | POA: Diagnosis not present

## 2019-12-28 DIAGNOSIS — Z9889 Other specified postprocedural states: Secondary | ICD-10-CM | POA: Diagnosis not present

## 2019-12-29 DIAGNOSIS — Z23 Encounter for immunization: Secondary | ICD-10-CM | POA: Diagnosis not present

## 2020-01-01 ENCOUNTER — Other Ambulatory Visit: Payer: Self-pay

## 2020-01-01 ENCOUNTER — Ambulatory Visit (INDEPENDENT_AMBULATORY_CARE_PROVIDER_SITE_OTHER): Payer: Medicare Other | Admitting: Psychiatry

## 2020-01-01 DIAGNOSIS — F411 Generalized anxiety disorder: Secondary | ICD-10-CM

## 2020-01-01 NOTE — Progress Notes (Signed)
      [facility] Counselor/Therapist Progress Note  Patient ID: [name], MRN: [id number],    Date: 01/01/2020  Time Spent: 50 minutes   12:00noon to 12:50pm  Treatment Type: Individual Therapy  Reported Symptoms: anxiety,depression, with anxiety being stronger  Mental Status Exam:  Appearance:   Neat     Behavior:  Appropriate and Sharing  Motor:  Normal  Speech/Language:   Clear and Coherent  Affect:  anxious, depressed  Mood:  anxious and depressed  Thought process:  goal directed  Thought content:    WNL  Sensory/Perceptual disturbances:    WNL  Orientation:  oriented to person, place, time/date, situation, day of week, month of year and year  Attention:  Good  Concentration:  Good and Fair  Memory:  forgetfulness and worse under stress  Fund of knowledge:   Good  Insight:    Good  Judgment:   Good  Impulse Control:  Good   Risk Assessment: Danger to Self:  No Self-injurious Behavior: No Danger to Others: No Duty to Warn:no Physical Aggression / Violence:No  Access to Firearms a concern: No  Gang Involvement:No   Subjective: Patient today reporting anxiety and depression with anxiety being some worse.  "Last week was rough but due to primarily one incident, involving oldest daughter."   Interventions: Solution-Oriented/Positive Psychology and Ego-Supportive  Diagnosis:   ICD-10-CM   1. Generalized anxiety disorder  F41.1     Plan: Patient not signing tx plan updates on computer screen due to [place].  Treatment goals: Goals may remain on tx plan as patient works on strategies to meet her goals.Progress is noted every session in the "Progress" section of Plan.  Long term goal: Develop healthy cognitive patterns and beliefs about self and the world that lead to alleviation of depression and anxiety, and help prevent relapse of depression and anxiety.  Short term goal: Learn and implement personal skills for managing stress, solving daily  problems, and resolving conflicts effectively.  Strategies: Use modelingandrole-playing to help recognize anxious/depressive/negative thought patterns that create anxious/depressive/negative feelings and actions, interrupt them and replace with more positive reality-based thoughts that do not support depression.  Progress; Patient in today and reports anxiety being her strongest symptom. Also depressed. Concerned about her forgetfulness increasing recently and is checking with each of her doctors to see if any of her prescribed meds may be influencing her forgetting. "Bad incident" with her daughter "A", where daughter was very angry without really listening and getting all the information before speaking. Several very hurtful comments from patient's daughter, and discussed this with patient as she is still trying to process all that happended. Used short term goal and strategies in Tx Plan in working on interrupting her anxious/depressive thoughts and replacing them with more encouraging, positive, and reality-based thoughts that do not support anxiety. Continued efforts to set appropriate boundaries, learning to say "no" without feeling badly about, and patient setting limits with both family and church related responsibilities.  Encouraged patient to practice additional positive self-care with healthy nutrition and exercise, being more positive in her self talk, getting outside each day, enjoying her dog, remaining on her medications as prescribed, and staying in contact with people that are supportive.  Goal review and progress/challenges noted with patient.  Next appt within 2-3 weeks.   Shanon Ace, LCSW

## 2020-01-02 DIAGNOSIS — K224 Dyskinesia of esophagus: Secondary | ICD-10-CM | POA: Diagnosis not present

## 2020-01-02 DIAGNOSIS — K219 Gastro-esophageal reflux disease without esophagitis: Secondary | ICD-10-CM | POA: Diagnosis not present

## 2020-01-02 DIAGNOSIS — R0789 Other chest pain: Secondary | ICD-10-CM | POA: Diagnosis not present

## 2020-01-07 ENCOUNTER — Encounter: Payer: Self-pay | Admitting: Obstetrics and Gynecology

## 2020-01-07 ENCOUNTER — Encounter: Payer: Self-pay | Admitting: Cardiovascular Disease
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
## 2020-01-08 ENCOUNTER — Other Ambulatory Visit: Payer: Self-pay

## 2020-01-08 ENCOUNTER — Encounter: Payer: Self-pay | Admitting: Cardiovascular Disease

## 2020-01-08 ENCOUNTER — Ambulatory Visit (INDEPENDENT_AMBULATORY_CARE_PROVIDER_SITE_OTHER): Payer: Medicare Other | Admitting: Cardiovascular Disease

## 2020-01-08 VITALS — BP 114/72 | HR 73 | Ht 65.0 in | Wt 154.0 lb

## 2020-01-08 DIAGNOSIS — I493 Ventricular premature depolarization: Secondary | ICD-10-CM

## 2020-01-08 DIAGNOSIS — R079 Chest pain, unspecified: Secondary | ICD-10-CM | POA: Diagnosis not present

## 2020-01-08 NOTE — Patient Instructions (Signed)
Medication Instructions:   *If you need a refill on your cardiac medications before your next appointment, please call your pharmacy*   Lab Work: none If you have labs (blood work) drawn today and your tests are completely normal, you will receive your results only by: Marland Kitchen MyChart Message (if you have MyChart) OR . A paper copy in the mail If you have any lab test that is abnormal or we need to change your treatment, we will call you to review the results.   Testing/Procedures: none   Follow-Up: At [facility], you and your health needs are our priority.  As part of our continuing mission to provide you with exceptional heart care, we have created designated Provider Care Teams.  These Care Teams include your primary Cardiologist (physician) and Advanced Practice Providers (APPs -  Physician Assistants and Nurse Practitioners) who all work together to provide you with the care you need, when you need it.  We recommend signing up for the patient portal called "MyChart".  Sign up information is provided on this After Visit Summary.  MyChart is used to connect with patients for Virtual Visits (Telemedicine).  Patients are able to view lab/test results, encounter notes, upcoming appointments, etc.  Non-urgent messages can be sent to your provider as well.   To learn more about what you can do with MyChart, go to [email or web address].    Your next appointment:   1 year(s)  The format for your next appointment:   In Person  Provider:   Mertie Moores, MD   Other Instructions

## 2020-01-10 ENCOUNTER — Other Ambulatory Visit: Payer: Self-pay

## 2020-01-10 DIAGNOSIS — N952 Postmenopausal atrophic vaginitis: Secondary | ICD-10-CM

## 2020-01-10 DIAGNOSIS — Z01419 Encounter for gynecological examination (general) (routine) without abnormal findings: Secondary | ICD-10-CM

## 2020-01-10 NOTE — Telephone Encounter (Signed)
Pt sent the following mychart message:  [name], [facility] Before you had me on an estradiol insert and suddenly I could no longer refill it. Is there a reason that you stopped this medication? Or can I get a new prescription called in to [facility]?  Sherlon Handing  [phone]

## 2020-01-11 MED ORDER — ESTRADIOL 10 MCG VA TABS: ORAL_TABLET | VAGINAL | 1 refills | Status: DC

## 2020-01-11 NOTE — Telephone Encounter (Signed)
Ok for patient to resume Vagifem tablets 10 mcg pv at hs nightly for 2 weeks and then twice a week until her annual exam if she would like to resume this medication.   The medication treats vaginal dryness due to menopause and can improve the elasticity/hydration of the vagina in order to reduce painful intercourse.

## 2020-01-11 NOTE — Telephone Encounter (Signed)
Left message to call Silas Muff, CMA. °

## 2020-01-11 NOTE — Telephone Encounter (Signed)
AEX 08/2018, next scheduled 03/2020 with Dr Arlyn Dunning with pt. Pt states having screening MMG and Dx this summer 2021 and that she has been out of estrogen vaginal tablets since Sept. Pt wanting to know if needs to continue Rx. Pt states having same hot flashes, but no other vasomotor sx and denies any increase or worsening sx. Pt states is taking OTC EstraCal.   Advised pt will review with Dr Quincy Simmonds and return call to pt with recommendations. Please advise   [facility] reports faxed and placed on Dr Elza Rafter desk for review.

## 2020-01-11 NOTE — Telephone Encounter (Signed)
Spoke with [name]. [name] given update and recommendations per Dr Quincy Simmonds. [name] agreeable and verbalized understanding. [name] agreeable to take Vagifem Rx until AEX and give update then.  Rx sent to pharmacy on file # 24, 1 RF.  Encounter closed

## 2020-01-12 DIAGNOSIS — M25562 Pain in left knee: Secondary | ICD-10-CM | POA: Diagnosis not present

## 2020-01-15 ENCOUNTER — Encounter: Payer: PRIVATE HEALTH INSURANCE | Admitting: Psychiatry

## 2020-01-15 NOTE — Progress Notes (Signed)
      [facility] Counselor/Therapist Progress Note  Patient ID: [name], MRN: [id number],    Date: 01/15/2020  Time Spent:   Treatment Type:   Reported Symptoms: anxiety       Mental Status Exam:  Appearance:     Behavior:    Motor:    Speech/Language:     Affect:    Mood:    Thought process:    Thought content:      Sensory/Perceptual disturbances:      Orientation:    Attention:    Concentration:    Memory:    Fund of knowledge:     Insight:      Judgment:     Impulse Control:     Risk Assessment: Danger to Self:  Self-injurious Behavior:  Danger to Others: Duty to Warn: Physical Aggression / Violence: Access to Firearms a concern:  Gang Involvement:  Subjective:   Interventions:   Diagnosis:   ICD-10-CM   1. Generalized anxiety disorder  F41.1     Plan: Patient not signing tx plan updates on computer screen due to [place].  Treatment goals: Goals may remain on tx plan as patient works on strategies to meet her goals.Progress is noted every session in the "Progress" section of Plan.  Long term goal: Develop healthy cognitive patterns and beliefs about self and the world that lead to alleviation of depression and anxiety, and help prevent relapse of depression and anxiety.  Short term goal: Learn and implement personal skills for managing stress, solving daily problems, and resolving conflicts effectively.  Strategies: Use modelingandrole-playing to help recognizeanxious/depressive/negative thought patterns that createanxious/depressive/negativefeelings and actions, interrupt them and replace with more positive reality-based thoughts that do not support depression.  Progress;      Shanon Ace, LCSW        This encounter was created in error - please disregard. This encounter was created in error - please disregard. This encounter was created in error - please disregard.

## 2020-01-17 ENCOUNTER — Encounter: Payer: Self-pay | Admitting: Psychiatry

## 2020-01-17 ENCOUNTER — Telehealth: Payer: Self-pay | Admitting: Psychiatry

## 2020-01-17 ENCOUNTER — Telehealth (INDEPENDENT_AMBULATORY_CARE_PROVIDER_SITE_OTHER): Payer: Medicare Other | Admitting: Psychiatry

## 2020-01-17 DIAGNOSIS — F5101 Primary insomnia: Secondary | ICD-10-CM

## 2020-01-17 DIAGNOSIS — F330 Major depressive disorder, recurrent, mild: Secondary | ICD-10-CM | POA: Diagnosis not present

## 2020-01-17 DIAGNOSIS — F411 Generalized anxiety disorder: Secondary | ICD-10-CM | POA: Diagnosis not present

## 2020-01-17 MED ORDER — LORAZEPAM 1 MG PO TABS: ORAL_TABLET | ORAL | 5 refills | Status: DC

## 2020-01-17 MED ORDER — BUSPIRONE HCL 30 MG PO TABS: 30.0000 mg | ORAL_TABLET | Freq: Two times a day (BID) | ORAL | 1 refills | Status: DC

## 2020-01-17 MED ORDER — LAMOTRIGINE 150 MG PO TABS: 150.0000 mg | ORAL_TABLET | Freq: Every day | ORAL | 1 refills | Status: DC

## 2020-01-17 MED ORDER — DULOXETINE HCL 60 MG PO CPEP: 60.0000 mg | ORAL_CAPSULE | Freq: Every day | ORAL | 1 refills | Status: DC
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
## 2020-01-17 NOTE — Telephone Encounter (Signed)
Ms. vala, raffo are scheduled for a virtual visit with your provider today.    Just as we do with appointments in the office, we must obtain your consent to participate.  Your consent will be active for this visit and any virtual visit you may have with one of our providers in the next 365 days.    If you have a MyChart account, I can also send a copy of this consent to you electronically.  All virtual visits are billed to your insurance company just like a traditional visit in the office.  As this is a virtual visit, video technology does not allow for your provider to perform a traditional examination.  This may limit your provider's ability to fully assess your condition.  If your provider identifies any concerns that need to be evaluated in person or the need to arrange testing such as labs, EKG, etc, we will make arrangements to do so.    Although advances in technology are sophisticated, we cannot ensure that it will always work on either your end or our end.  If the connection with a video visit is poor, we may have to switch to a telephone visit.  With either a video or telephone visit, we are not always able to ensure that we have a secure connection.   I need to obtain your verbal consent now.   Are you willing to proceed with your visit today?   KADAJAH KJOS has provided verbal consent on 01/17/2020 for a virtual visit (video or telephone).   Thayer Headings, PMHNP 01/17/2020  1:49 PM

## 2020-01-18 ENCOUNTER — Ambulatory Visit (INDEPENDENT_AMBULATORY_CARE_PROVIDER_SITE_OTHER): Payer: Medicare Other | Admitting: Psychiatry

## 2020-01-18 DIAGNOSIS — F411 Generalized anxiety disorder: Secondary | ICD-10-CM

## 2020-01-18 NOTE — Progress Notes (Signed)
[facility] Counselor/Therapist Progress Note  Patient ID: [name], MRN: [id number],    Date: 01/18/2020  Time Spent: 50 minutes  11:00am to 11:50am  Virtual Visit Note via [place] with patient by a video enabled telemedicine/telehealth application or telephone, with their informed consent, and verified patient privacy and that I am speaking with the correct person using two identifiers. I discussed the limitations, risks, security and privacy concerns of performing psychotherapy and management service by telephone and the availability of in person appointments. I also discussed with the patient that there may be a patient responsible charge related to this service. The patient expressed understanding and agreed to proceed. I discussed the treatment planning with the patient. The patient was provided an opportunity to ask questions and all were answered. The patient agreed with the plan and demonstrated an understanding of the instructions. The patient was advised to call  our office if  symptoms worsen or feel they are in a crisis state and need immediate contact.   Therapist Location: [facility]  Patient Location: home   Treatment Type: Individual Therapy  Reported Symptoms: anxiety, some depression, health issues (knee-possible meniscus)  Mental Status Exam:  Appearance:   Casual     Behavior:  Appropriate, Sharing and Motivated  Motor:  Normal  Speech/Language:   Clear and Coherent  Affect:  anxious, some depression  Mood:  anxious and depressed  Thought process:  goal directed  Thought content:    WNL  Sensory/Perceptual disturbances:    WNL  Orientation:  oriented to person, place, time/date, situation, day of week, month of year and year  Attention:  Good  Concentration:  Good and Fair  Memory:  forgetfulness, "I make lists which helps me."  Fund of knowledge:   Good  Insight:    Good  Judgment:   Good  Impulse Control:  Good and Fair    Risk Assessment: Danger to Self:  No Self-injurious Behavior: No Danger to Others: No Duty to Warn:no Physical Aggression / Violence:No  Access to Firearms a concern: No  Gang Involvement:No   Subjective: Patient today reports anxiety as main symptom, along with depression.  Concerned about her knee and possible eventual surgery.   Interventions: Solution-Oriented/Positive Psychology and Ego-Supportive  Diagnosis:   ICD-10-CM   1. Generalized anxiety disorder  F41.1      Plan: Patient not signing tx plan updates on computer screen due to [place].  Treatment goals: Goals may remain on tx plan as patient works on strategies to meet her goals.Progress is noted every session in the "Progress" section of Plan.  Long term goal: Develop healthy cognitive patterns and beliefs about self and the world that lead to alleviation of depression and anxiety, and help prevent relapse of depression and anxiety.  Short term goal: Learn and implement personal skills for managing stress, solving daily problems, and resolving conflicts effectively.  Strategies: Use modelingandrole-playing to help recognizeanxious/depressive/negative thought patterns that createanxious/depressive/negativefeelings and actions, interrupt them and replace with more positive reality-based thoughts that do not support depression.  Progress; Patient today reports anxiety, depression, and "really struggling with upcoming holidays" re: family dynamics and situations.  Denies any SI.  Anxious, some tearfulness, and stressed as she spoke about family issues. Easy for some past history (negative) to come up for patient and we processed some of the issues, which seemed to be calming for patient and helping her realize "what she can impact or control, and what she can't", and to  try and stay more in the present versus past or future.  Worked today using strategy and short-term goal in treatment plan above, and  helping patient work to identify more quick her anxious/depressive thought patterns, interrupt them and challenge them, then replace them with more reality based, affirming, and empowering thought patterns were to feel anxious or depressed.  Looked at some specific examples especially with her 2 daughters have better insight on those situations and may be developing more patience that can help her cope more in the moment with family members.  Seem to feel encouraged and calm by session and and "not really dreading the but still some anxiety".  Encouraged patient to practice healthier boundaries with family especially, be able to say "no" when she needs to and without guilt attached, staying in contact with supportive friends, trying to stay more in the present, spending some time outdoors each day, enjoying her dog, remaining on medications as prescribed, and making self talk more positive.  Goal review and progress/challenges noted with patient.  Next appt within 3 weeks.   Shanon Ace, LCSW

## 2020-01-21 ENCOUNTER — Other Ambulatory Visit: Payer: Self-pay | Admitting: Cardiovascular Disease

## 2020-01-22 ENCOUNTER — Other Ambulatory Visit: Payer: Self-pay | Admitting: Psychiatry

## 2020-01-22 DIAGNOSIS — F332 Major depressive disorder, recurrent severe without psychotic features: Secondary | ICD-10-CM

## 2020-01-22 DIAGNOSIS — F411 Generalized anxiety disorder: Secondary | ICD-10-CM

## 2020-01-22 DIAGNOSIS — F330 Major depressive disorder, recurrent, mild: Secondary | ICD-10-CM

## 2020-02-01 ENCOUNTER — Other Ambulatory Visit: Payer: Self-pay

## 2020-02-01 ENCOUNTER — Ambulatory Visit (INDEPENDENT_AMBULATORY_CARE_PROVIDER_SITE_OTHER): Payer: Medicare Other | Admitting: Psychiatry

## 2020-02-01 DIAGNOSIS — F411 Generalized anxiety disorder: Secondary | ICD-10-CM | POA: Diagnosis not present

## 2020-02-01 NOTE — Progress Notes (Signed)
[facility] Counselor/Therapist Progress Note  Patient ID: [name], MRN: [id number],    Date: 02/01/2020  Time Spent: 60 minutes   11:00am to 12:00noon  Treatment Type: Individual Therapy  Reported Symptoms: states "anxiety" is stronger symptom, also some depression, anger, sadness   Mental Status Exam:  Appearance:   Casual     Behavior:  Appropriate, Sharing and Motivated  Motor:  Normal  Speech/Language:   Clear and Coherent  Affect:  anxious, depressed, sad, angry  Mood:  angry, anxious, depressed and sad  Thought process:  normal  Thought content:    WNL  Sensory/Perceptual disturbances:    WNL  Orientation:  oriented to person, place, time/date, situation, day of week, month of year and year  Attention:  Good  Concentration:  Good  Memory:  "sometimes forget certain words"  Fund of knowledge:   Good  Insight:    Good  Judgment:   Good and Fair  Impulse Control:  Good and Fair   Risk Assessment: Danger to Self:  No Self-injurious Behavior: No Danger to Others: No Duty to Warn:no Physical Aggression / Violence:No  Access to Firearms a concern: No  Gang Involvement:No   Subjective:  Patient reports anxiety is stronger symptom, along with depression, anger, and sadness.  Feels like "I've been able to manage stressful situations within family by not letting others know because I didn't want to ruin the time together."   Interventions: Cognitive Behavioral Therapy and Solution-Oriented/Positive Psychology  Diagnosis:   ICD-10-CM   1. Generalized anxiety disorder  F41.1      Plan: Patient not signing tx plan updates on computer screen due to [place].  Treatment goals: Goals may remain on tx plan as patient works on strategies to meet her goals.Progress is noted every session in the "Progress" section of Plan.  Long term goal: Develop healthy cognitive patterns and beliefs about self and the world that lead to alleviation of depression and  anxiety, and help prevent relapse of depression and anxiety.  Short term goal: Learn and implement personal skills for managing stress, solving daily problems, and resolving conflicts effectively.  Strategies: Use modelingandrole-playing to help recognizeanxious/depressive/negative thought patterns that createanxious/depressive/negativefeelings and actions, interrupt them and replace with more positive reality-based thoughts that do not support depression.  Progress; Patient in today reporting anxiety as strongest symptom, along with anger, depression, and sadness, a lot of which was related to recent family issues around Thanksgiving."One of adult daughters was challenging, argumentative, and bullying." Patient hurt by what all happened and in session today tearfully described how issues with daughter and husband played out in very hurtful ways. Processed a lot of her anxieties, fears, anger, and frustrations today in session.  Was very hurt and somewhat shocked by the way things plate out but seem to be better after discussing the issues today and feeling heard, without judgment.  Helped her in looking at the upcoming holidays as that daughter and husband will be back again, and talk through some ways of handling situations and differences, that might feel more manageable to patient and also preserve some healthy boundaries for her.  Patient actually took some notes during our discussion that she feels will be helpful in guiding some of her responses and boundary setting with daughter and daughter's husband.  Also discussed a conversation she wants to have with her husband to clarify some things that were said earlier and decisions made that impacted him in a negative way.  Did seem  to feel less stressed and not holding in her emotions by session end. Encouraged patient to remain on medications as prescribed, get outside some each day when possible, stay in the present versus the past or future,  remain in contact with supportive friends, continue practicing saying "no" when she needs to and without guilt, making her self-talk more positive and affirming, and practice healthy boundaries with family and others as needed.  Goal review and progress/challenges noted with patient.  Next appointment within 2-3 weeks.   Shanon Ace, LCSW

## 2020-02-08 DIAGNOSIS — M25562 Pain in left knee: Secondary | ICD-10-CM | POA: Diagnosis not present

## 2020-02-14 DIAGNOSIS — G43019 Migraine without aura, intractable, without status migrainosus: Secondary | ICD-10-CM | POA: Diagnosis not present

## 2020-02-15 ENCOUNTER — Ambulatory Visit: Payer: PRIVATE HEALTH INSURANCE | Admitting: Psychiatry

## 2020-02-15 DIAGNOSIS — S83282A Other tear of lateral meniscus, current injury, left knee, initial encounter: Secondary | ICD-10-CM | POA: Diagnosis not present

## 2020-02-15 DIAGNOSIS — M25562 Pain in left knee: Secondary | ICD-10-CM | POA: Diagnosis not present

## 2020-02-20 ENCOUNTER — Other Ambulatory Visit (HOSPITAL_COMMUNITY): Payer: Self-pay | Admitting: *Deleted

## 2020-02-21 ENCOUNTER — Ambulatory Visit (HOSPITAL_COMMUNITY)
Admission: RE | Admit: 2020-02-21 | Discharge: 2020-02-21 | Disposition: A | Discharge status: Home / Self Care | Payer: Medicare Other | Source: Ambulatory Visit | Attending: Internal Medicine | Admitting: Internal Medicine

## 2020-02-21 ENCOUNTER — Other Ambulatory Visit: Payer: Self-pay

## 2020-02-21 DIAGNOSIS — M810 Age-related osteoporosis without current pathological fracture: Secondary | ICD-10-CM | POA: Diagnosis not present

## 2020-02-21 MED ORDER — DENOSUMAB 60 MG/ML ~~LOC~~ SOSY
60.0000 mg | PREFILLED_SYRINGE | Freq: Once | SUBCUTANEOUS | Status: AC
  Administered 2020-02-21: 13:00:00 60 mg via SUBCUTANEOUS

## 2020-02-21 MED ORDER — DENOSUMAB 60 MG/ML ~~LOC~~ SOSY
PREFILLED_SYRINGE | SUBCUTANEOUS | Status: AC
  Filled 2020-02-21: qty 1

## 2020-02-25 ENCOUNTER — Other Ambulatory Visit: Payer: Self-pay | Admitting: Psychiatry

## 2020-02-25 DIAGNOSIS — F332 Major depressive disorder, recurrent severe without psychotic features: Secondary | ICD-10-CM

## 2020-02-26 ENCOUNTER — Telehealth: Payer: Self-pay | Admitting: Radiology

## 2020-02-26 NOTE — Telephone Encounter (Signed)
Patient called, she was here recently and signed a release of records for her records to be faxed to Dr. Dierdre Forth at [facility]. Please advise patient of the status of this request.

## 2020-02-27 NOTE — Telephone Encounter (Signed)
Called patient to let her know it was received by medical records on 02/12/20 and was faxed to Dr. Dierdre Forth.  Patient states Dr. Shawnee Knapp office hasn't received records.  Patient was given Medical Records phone #.

## 2020-02-29 ENCOUNTER — Other Ambulatory Visit: Payer: Self-pay

## 2020-02-29 ENCOUNTER — Ambulatory Visit (INDEPENDENT_AMBULATORY_CARE_PROVIDER_SITE_OTHER): Payer: Medicare Other | Admitting: Psychiatry

## 2020-02-29 DIAGNOSIS — F330 Major depressive disorder, recurrent, mild: Secondary | ICD-10-CM

## 2020-02-29 NOTE — Progress Notes (Signed)
[facility] Counselor/Therapist Progress Note  Patient ID: [name], MRN: [id number],    Date: 02/29/2020  Time Spent: 60 minutes   11:00am to 12:00noon  Treatment Type: Individual Therapy  Reported Symptoms: depression, anxiety, frustration  Mental Status Exam:  Appearance:   Well Groomed     Behavior:  Appropriate, Sharing and Motivated  Motor:  Normal  Speech/Language:   Clear and Coherent  Affect:  depressed, anxious  Mood:  anxious and depressed  Thought process:  normal  Thought content:    WNL  Sensory/Perceptual disturbances:    WNL  Orientation:  oriented to person, place, time/date, situation, day of week, month of year and year  Attention:  Good  Concentration:  Good  Memory:  some forgetting, worse when stressed  Fund of knowledge:   Good  Insight:    Good  Judgment:   Good  Impulse Control:  Good   Risk Assessment: Danger to Self:  No Self-injurious Behavior: No Danger to Others: No Duty to Warn:no Physical Aggression / Violence:No  Access to Firearms a concern: No  Gang Involvement:No   Subjective: Patient today reporting anxiety, depression, with depression being the stronger symptom.  Multiple family issues that have been difficult for patient to manage.   Interventions: Solution-Oriented/Positive Psychology and Ego-Supportive  Diagnosis:   ICD-10-CM   1. Major depressive disorder, recurrent episode, mild ([place])  F33.0     Plan: Patient not signing tx plan updates on computer screen due to [place].  Treatment goals: Goals may remain on tx plan as patient works on strategies to meet her goals.Progress is noted every session in the "Progress" section of Plan.  Long term goal: Develop healthy cognitive patterns and beliefs about self and the world that lead to alleviation of depression and anxiety, and help prevent relapse of depression and anxiety.  Short term goal: Learn and implement personal skills for managing stress,  solving daily problems, and resolving conflicts effectively.  Strategies: Use modelingandrole-playing to help recognizeanxious/depressive/negative thought patterns that createanxious/depressive/negativefeelings and actions, interrupt them and replace with more positive reality-based thoughts that do not support depression.  Progress; Patient in today reporting depression being her stronger symptom, along with anxiety. Several family situations that have added to her stress recently.  States she tried to use more positive self-talk and her faith to help her cope. Relationship concerns include wanting to feel closer physically to her husband and plans to speak with husband about this.  "I just miss [place] touching each other."  Patient processed her feelings about this as well as some concerns re: husband's health issues. Encouraged patient to talk with husband about being more physical and give examples of how one of their doctors had suggested several ways to share being physical but not necessarily sexual, which is what patient felt might be better for now.  Did have a couple of differences of opinion with adult daughter and her husband, but states they were able to work it out and it did not negatively impact the holidays.  Encourage patient to follow-up with her husband, on suggestions from our session today regarding reestablishing some physical closeness.  Also encouraged to when able get outside some each day, use positive self talk daily, look for positives daily, stay on her prescribed medications, stay in the present, continue setting appropriate limits and saying no when she needs to with others, remain in contact with people who are supportive of her, focus on what she can control versus cannot  control, and when needed, set and keep appropriate boundaries with family and others.   Goal review and progress/challenges noted with patient.  Next appt within 2-3 weeks.   Mathis Fare,  LCSW

## 2020-03-12 ENCOUNTER — Ambulatory Visit (INDEPENDENT_AMBULATORY_CARE_PROVIDER_SITE_OTHER): Payer: Medicare Other | Admitting: Psychiatry

## 2020-03-12 ENCOUNTER — Other Ambulatory Visit: Payer: Self-pay

## 2020-03-12 DIAGNOSIS — F411 Generalized anxiety disorder: Secondary | ICD-10-CM

## 2020-03-12 NOTE — Progress Notes (Signed)
[facility] Counselor/Therapist Progress Note  Patient ID: [name], MRN: [id number],    Date: 03/12/2020  Time Spent: 60 minutes  11:00am to 12:00noon  Treatment Type: Individual Therapy  Reported Symptoms: anxiety,depression has "been some more manageable"; surgery for torn meniscus Feb. 8th  Mental Status Exam:  Appearance:   Casual     Behavior:  Appropriate, Sharing and Motivated  Motor:  Normal  Speech/Language:   Clear and Coherent  Affect:  anxious, some depression  Mood:  anxious and some depression  Thought process:  goal directed  Thought content:    WNL  Sensory/Perceptual disturbances:    WNL  Orientation:  oriented to person, place, time/date, situation, day of week, month of year and year  Attention:  Good  Concentration:  Good  Memory:  some forgetting of words ocassionally, had told doctor  Fund of knowledge:   Good  Insight:    Good  Judgment:   Good  Impulse Control:  Good   Risk Assessment: Danger to Self:  No Self-injurious Behavior: No Danger to Others: No Duty to Warn:no Physical Aggression / Violence:No  Access to Firearms a concern: No  Gang Involvement:No   Subjective: Patient today reports anxiety as main symptom, with some depression (that has lessened).  States "depression has been more manageable recently."     Interventions: Cognitive Behavioral Therapy and Solution-Oriented/Positive Psychology  Diagnosis:   ICD-10-CM   1. Generalized anxiety disorder  F41.1     Plan: Patient not signing tx plan updates on computer screen due to [place].  Treatment goals: Goals may remain on tx plan as patient works on strategies to meet her goals.Progress is noted every session in the "Progress" section of Plan.  Long term goal: Develop healthy cognitive patterns and beliefs about self and the world that lead to alleviation of depression and anxiety, and help prevent relapse of depression and anxiety.  Short term goal: Learn  and implement personal skills for managing stress, solving daily problems, and resolving conflicts effectively.  Strategies: Use modelingandrole-playing to help recognizeanxious/depressive/negative thought patterns that createanxious/depressive/negativefeelings and actions, interrupt them and replace with more positive reality-based thoughts that do not support depression.  Progress; Patient in today reporting anxiety as main symptom, with depression having been more manageable. Relationship issues within family that have been more problematic recently, esp with one of her daughters. Patient shared 2 situations with daughters that have been very problematic in past but seemed better until recent issues due to communication concerns and Covid.  Boundary issues are critical with the daughters.  Also processed some of her feelings today in reference to husband and she wanting to feel closer physically, in spite of challenges and limitations.  Feels that they have not made much progress since last appointment and also shared some reasons why.  Patient is due to have knee surgery due to a meniscus tear on February 8 and understandably having some anxiety about that as well.  Encouraged her to be in touch with supportive people, use positive self talk, focus on what she can control versus cannot control, stay in the present versus the past or the future which aggravates her anxiety, remain on prescribed medications, set appropriate limits and boundaries including saying no when she needs to with others, and intentionally look for more positives every day.  Goal review and progress/challenges noted with patient.   Next appt within 3 weeks.   Shanon Ace, LCSW

## 2020-03-12 NOTE — Addendum Note (Signed)
Addended byShanon Ace on: 03/12/2020 12:07 PM   Modules accepted: Level of Service

## 2020-03-14 ENCOUNTER — Other Ambulatory Visit: Payer: Self-pay | Admitting: Psychiatry

## 2020-03-14 DIAGNOSIS — F332 Major depressive disorder, recurrent severe without psychotic features: Secondary | ICD-10-CM

## 2020-03-21 ENCOUNTER — Ambulatory Visit (INDEPENDENT_AMBULATORY_CARE_PROVIDER_SITE_OTHER): Payer: Medicare Other | Admitting: Obstetrics and Gynecology

## 2020-03-21 ENCOUNTER — Other Ambulatory Visit: Payer: Self-pay

## 2020-03-21 ENCOUNTER — Encounter: Payer: Self-pay | Admitting: Obstetrics and Gynecology

## 2020-03-21 VITALS — BP 114/70 | HR 82 | Ht 64.5 in | Wt 156.0 lb

## 2020-03-21 DIAGNOSIS — B009 Herpesviral infection, unspecified: Secondary | ICD-10-CM

## 2020-03-21 DIAGNOSIS — R195 Other fecal abnormalities: Secondary | ICD-10-CM

## 2020-03-21 DIAGNOSIS — N952 Postmenopausal atrophic vaginitis: Secondary | ICD-10-CM

## 2020-03-21 MED ORDER — VALACYCLOVIR HCL 500 MG PO TABS: ORAL_TABLET | ORAL | 11 refills | Status: DC

## 2020-03-21 MED ORDER — BETAMETHASONE VALERATE 0.1 % EX OINT: 1.0000 "application " | TOPICAL_OINTMENT | Freq: Two times a day (BID) | CUTANEOUS | 0 refills | Status: DC

## 2020-03-21 MED ORDER — NONFORMULARY OR COMPOUNDED ITEM: 11 refills | Status: DC
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
## 2020-03-24 NOTE — Patient Instructions (Signed)
Atrophic Vaginitis  Atrophic vaginitis is a condition in which the tissues that line the vagina become dry and thin. This condition is most common in women who have stopped having regular menstrual periods (are in menopause). This usually starts when a woman is 72 to 72 years old. That is the time when a woman's estrogen levels begin to decrease. Estrogen is a female hormone. It helps to keep the tissues of the vagina moist. It stimulates the vagina to produce a clear fluid that lubricates the vagina for sex. This fluid also protects the vagina from infection. Lack of estrogen can cause the lining of the vagina to get thinner and dryer. The vagina may also shrink in size. It may become less elastic. Atrophic vaginitis tends to get worse over time as a woman's estrogen level drops. What are the causes? This condition is caused by the normal drop in estrogen that happens around the time of menopause. What increases the risk? Certain conditions or situations may lower a woman's estrogen level, leading to a higher risk for atrophic vaginitis. You are more likely to develop this condition if:  You are taking medicines that block estrogen.  You have had your ovaries removed.  You are being treated for cancer with radiation or medicines (chemotherapy).  You have given birth or are breastfeeding.  You are older than age 72.  You smoke. What are the signs or symptoms? Symptoms of this condition include:  Pain, soreness, a feeling of pressure, or bleeding during sex (dyspareunia).  Vaginal burning, irritation, or itching.  Pain or bleeding when a speculum is used in a vaginal exam.  Having burning pain while urinating.  Vaginal discharge. In some cases, there are no symptoms. How is this diagnosed? This condition is diagnosed based on your medical history and a physical exam. This will include a pelvic exam that checks the vaginal tissues. Though rare, you may also have other tests,  including:  A urine test.  A test that checks the acid balance in your vagina (acid balance test). How is this treated? Treatment for this condition depends on how severe your symptoms are. Treatment may include:  Using an over-the-counter vaginal lubricant before sex.  Using a long-acting vaginal moisturizer.  Using low-dose estrogen for moderate to severe symptoms that do not respond to other treatments. Options include creams, tablets, and inserts (vaginal rings). Before you use a vaginal estrogen, tell your health care provider if you have a history of: ? Breast cancer. ? Endometrial cancer. ? Blood clots. If you are not sexually active and your symptoms are very mild, you may not need treatment. Follow these instructions at home: Medicines  Take over-the-counter and prescription medicines only as told by your health care provider.  Do not use herbal or alternative medicines unless your health care provider says that you can.  Use over-the-counter creams, lubricants, or moisturizers for dryness only as told by your health care provider. General instructions  If your atrophic vaginitis is caused by menopause, discuss all of your menopause symptoms and treatment options with your health care provider.  Do not douche.  Do not use products that can make your vagina dry. These include: ? Scented feminine sprays. ? Scented tampons. ? Scented soaps.  Vaginal sex can help to improve blood flow and elasticity of vaginal tissue. If you choose to have sex and it hurts, try using a water-soluble lubricant or moisturizer right before having sex. Contact a health care provider if:  Your discharge looks  different than normal.  Your vagina has an unusual smell.  You have new symptoms.  Your symptoms do not improve with treatment.  Your symptoms get worse. Summary  Atrophic vaginitis is a condition in which the tissues that line the vagina become dry and thin. It is most common  in women who have stopped having regular menstrual periods (are in menopause).  Treatment options include using vaginal lubricants and low-dose vaginal estrogen.  Contact a health care provider if your vagina has an unusual smell, or if your symptoms get worse or do not improve after treatment. This information is not intended to replace advice given to you by your health care provider. Make sure you discuss any questions you have with your health care provider. Document Revised: 08/17/2019 Document Reviewed: 08/17/2019 Elsevier Patient Education  [place].

## 2020-03-26 ENCOUNTER — Ambulatory Visit: Payer: Self-pay | Admitting: Obstetrics and Gynecology

## 2020-03-26 ENCOUNTER — Ambulatory Visit (INDEPENDENT_AMBULATORY_CARE_PROVIDER_SITE_OTHER): Payer: Medicare Other | Admitting: Psychiatry

## 2020-03-26 ENCOUNTER — Other Ambulatory Visit: Payer: Self-pay

## 2020-03-26 DIAGNOSIS — F411 Generalized anxiety disorder: Secondary | ICD-10-CM

## 2020-03-26 NOTE — Progress Notes (Signed)
[facility] Counselor/Therapist Progress Note  Patient ID: [name], MRN: [id number],    Date: 03/26/2020  Time Spent: 50 minutes   11:00am to 11:50am   Treatment Type: Individual Therapy  Reported Symptoms: anxiety, some depression  Mental Status Exam:  Appearance:   Neat     Behavior:  Appropriate and Sharing  Motor:  Normal  Speech/Language:   Clear and Coherent  Affect:  anxious, some depression  Mood:  anxious and depressed  Thought process:  normal  Thought content:    WNL  Sensory/Perceptual disturbances:    WNL  Orientation:  oriented to person, place, time/date, situation, day of week, month of year and year  Attention:  Good  Concentration:  Good  Memory:  forgetfulness, worse with stress or in hurry  Fund of knowledge:   Good  Insight:    Good  Judgment:   Good  Impulse Control:  Good   Risk Assessment: Danger to Self:  No Self-injurious Behavior: No Danger to Others: No Duty to Warn:no Physical Aggression / Violence:No  Access to Firearms a concern: No  Gang Involvement:No   Subjective: Patient today reporting anxiety and some depression. "I think I'm dealing better with things for example during recent ice/snow, instead of staying cooped up I did get out some in our neighborhood and it felt better than staying isolated inside.   Interventions: Cognitive Behavioral Therapy and Solution-Oriented/Positive Psychology  Diagnosis:   ICD-10-CM   1. Generalized anxiety disorder  F41.1     Plan: Patient not signing tx plan updates on computer screen due to [place].  Treatment goals: Goals may remain on tx plan as patient works on strategies to meet her goals.Progress is noted every session in the "Progress" section of Plan.  Long term goal: Develop healthy cognitive patterns and beliefs about self and the world that lead to alleviation of depression and anxiety, and help prevent relapse of depression and anxiety.  Short term goal: Learn  and implement personal skills for managing stress, solving daily problems, and resolving conflicts effectively.  Strategies: Use modelingandrole-playing to help recognizeanxious/depressive/negative thought patterns that createanxious/depressive/negativefeelings and actions, interrupt them and replace with more positive reality-based thoughts that do not support depression.  Progress; Patient in today reporting anxiety and some depression regarding mostly family including both adult daughters and her relationship with them. Processed several recent situations where there have been relationship concerns involving patient and other family members. Realizing she feels more clearly the dynamics between family members and tries to be accepting and yet "not let anyone run over me."  Does get stressed sometimes in midst of and planning for family gatherings and shared several examples in session today.  Is also having some presurgery anxiety for a meniscus repair on her knee within the next 2 weeks.  Worked with her long-term goal, short-term goal and strategies in her treatment plan above to help patient and more quickly recognizing her anxious thoughts events and be able to manage her anxiety in ways that help minimize her own personal stress and anxiousness while also being able to interact more freely amongst other people.  Patient is to continue working with her goal-directed behaviors and strategies in between sessions and especially with a family gathering coming up this week and her surgery coming up the following week.  Encouraged patient to practice more positive self talk, to stay in the present versus the future which heightens her anxiety, to remain on her prescribed medications, saying "no" to  others when she needs to, remaining in touch with people who are supportive of her, set and keep appropriate boundaries with others, getting outside some each day as she is able, enjoy playing with her dog,  and be very intentional and looking for positives every day.  Goal review and progress/challenges noted with patient.  Next appointment within 2 to 3 weeks.  Shanon Ace, LCSW

## 2020-04-02 DIAGNOSIS — J300 Vasomotor rhinitis: Secondary | ICD-10-CM | POA: Diagnosis not present

## 2020-04-02 DIAGNOSIS — J320 Chronic maxillary sinusitis: Secondary | ICD-10-CM | POA: Diagnosis not present

## 2020-04-02 DIAGNOSIS — J3489 Other specified disorders of nose and nasal sinuses: Secondary | ICD-10-CM | POA: Diagnosis not present

## 2020-04-02 DIAGNOSIS — J342 Deviated nasal septum: Secondary | ICD-10-CM | POA: Diagnosis not present

## 2020-04-02 DIAGNOSIS — J343 Hypertrophy of nasal turbinates: Secondary | ICD-10-CM | POA: Diagnosis not present

## 2020-04-05 DIAGNOSIS — Z974 Presence of external hearing-aid: Secondary | ICD-10-CM | POA: Diagnosis not present

## 2020-04-05 DIAGNOSIS — T161XXA Foreign body in right ear, initial encounter: Secondary | ICD-10-CM | POA: Diagnosis not present

## 2020-04-05 DIAGNOSIS — H903 Sensorineural hearing loss, bilateral: Secondary | ICD-10-CM | POA: Diagnosis not present

## 2020-04-08 ENCOUNTER — Other Ambulatory Visit: Payer: Self-pay

## 2020-04-08 ENCOUNTER — Ambulatory Visit (INDEPENDENT_AMBULATORY_CARE_PROVIDER_SITE_OTHER): Payer: Medicare Other | Admitting: Psychiatry

## 2020-04-08 DIAGNOSIS — F411 Generalized anxiety disorder: Secondary | ICD-10-CM

## 2020-04-08 IMAGING — CT CT ABD-PELV W/ CM
1 of 3 series · 13 of 32 positions shown, 19 images · IV contrast (iopamidol)
Comparison: 06/04/2017

CLINICAL DATA: Abdominal pain, unspecified location radiating to
left side of back

EXAM:
CT ABDOMEN AND PELVIS WITH CONTRAST
TECHNIQUE: Multidetector CT imaging of the abdomen and pelvis was performed
using the standard protocol following bolus administration of
intravenous contrast.
CONTRAST:  100mL 36L7LE-YSS IOPAMIDOL (36L7LE-YSS) INJECTION 61%

[Series 2: abd/pelvis w/cm · axial · 0.69mm/px · z∈[-147,+213]mm · 13 of 84 slices shown, 19 images]
[im 6/84  soft-tissue]
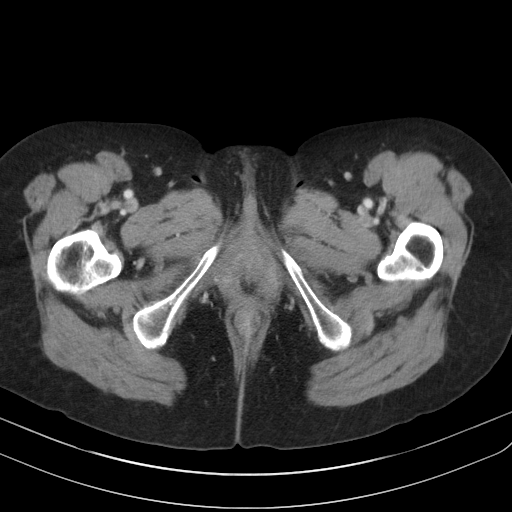
[im 6/84  bone]
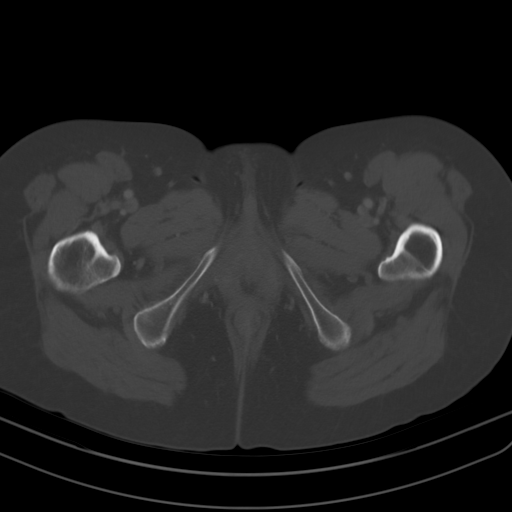
[im 12/84  soft-tissue]
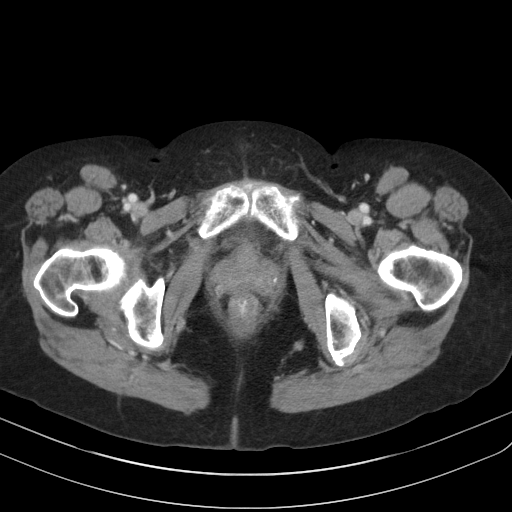
[im 17/84  soft-tissue]
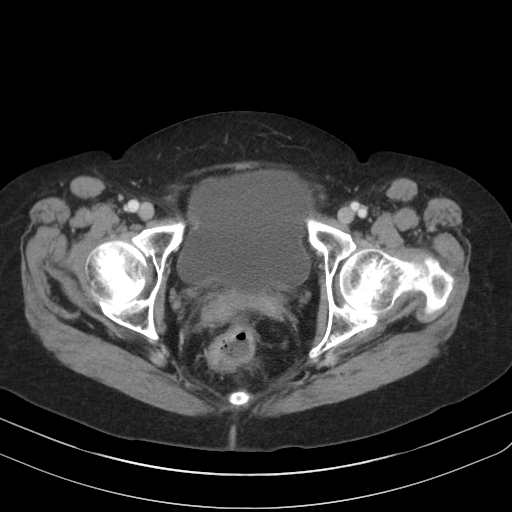
[im 23/84  soft-tissue]
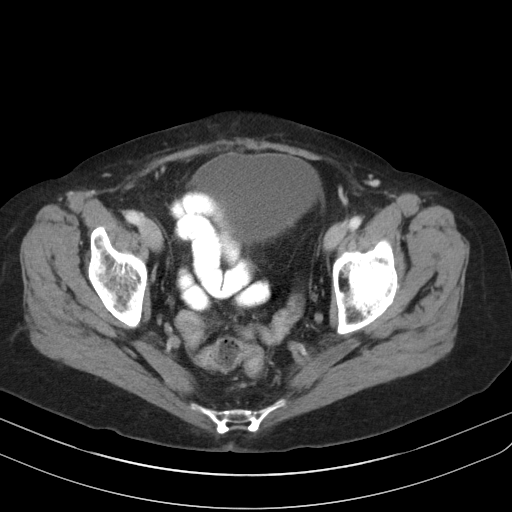
[im 28/84  soft-tissue]
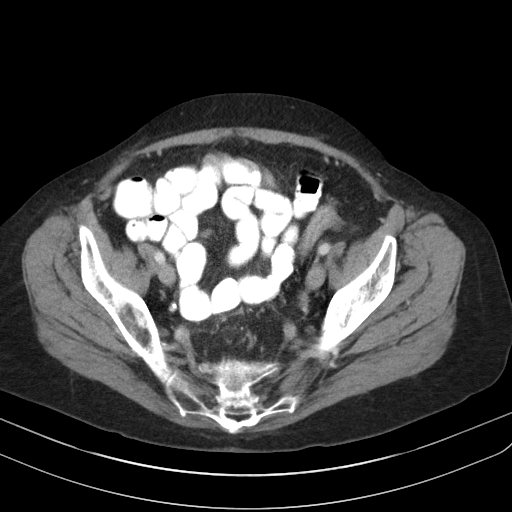
[im 34/84  soft-tissue]
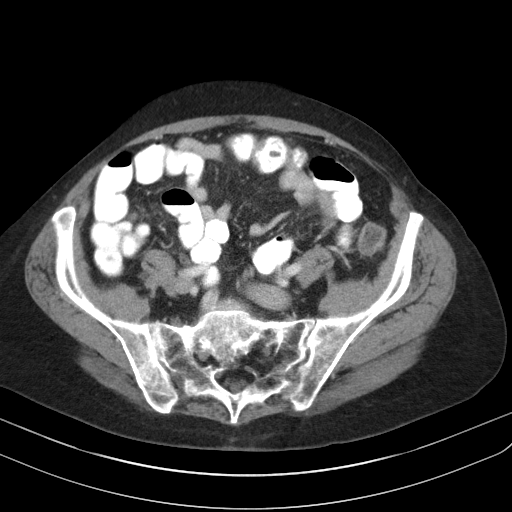
[im 45/84  soft-tissue]
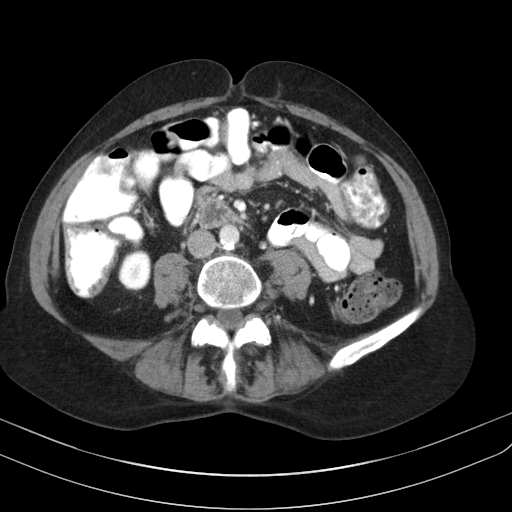
[im 50/84  soft-tissue]
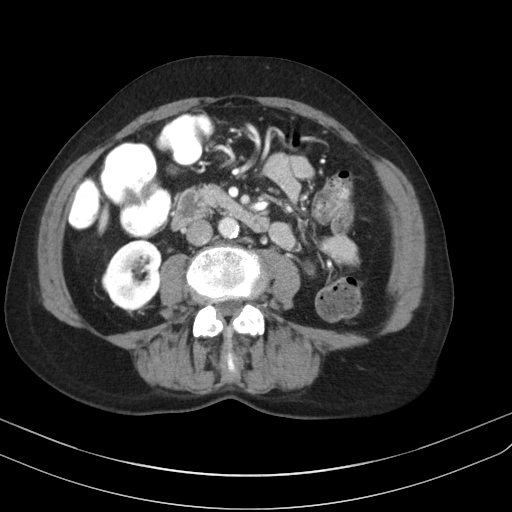
[im 56/84  soft-tissue]
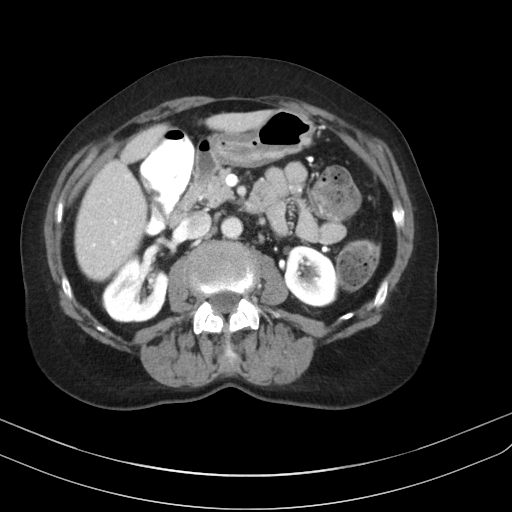
[im 56/84  bone]
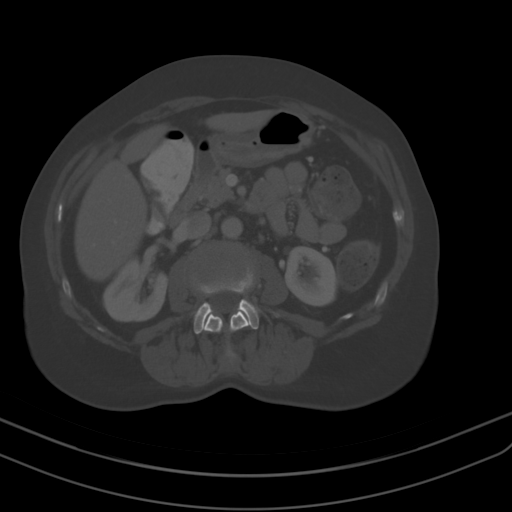
[im 61/84  soft-tissue]
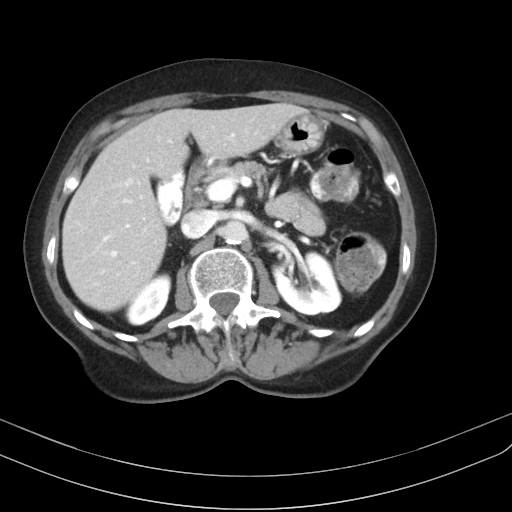
[im 61/84  lung]
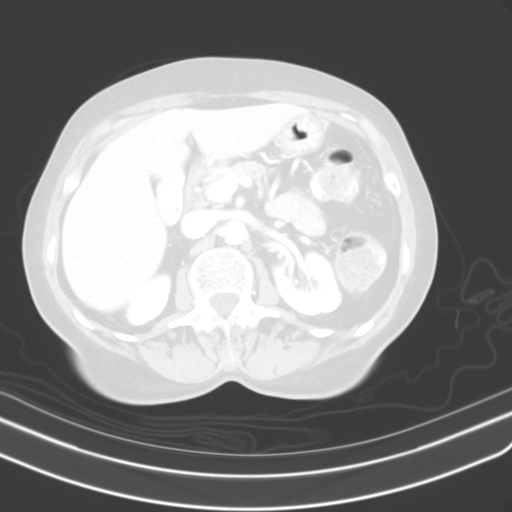
[im 67/84  soft-tissue]
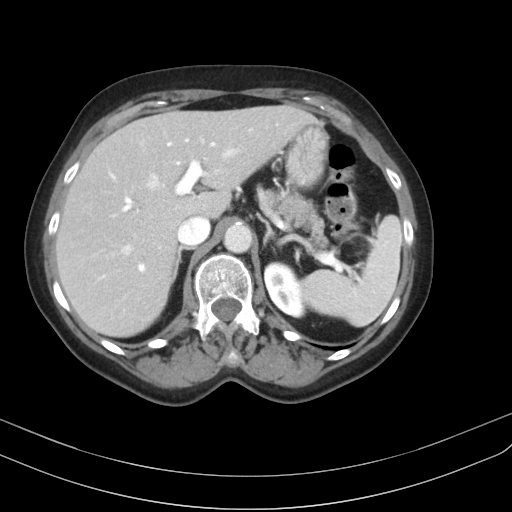
[im 67/84  lung]
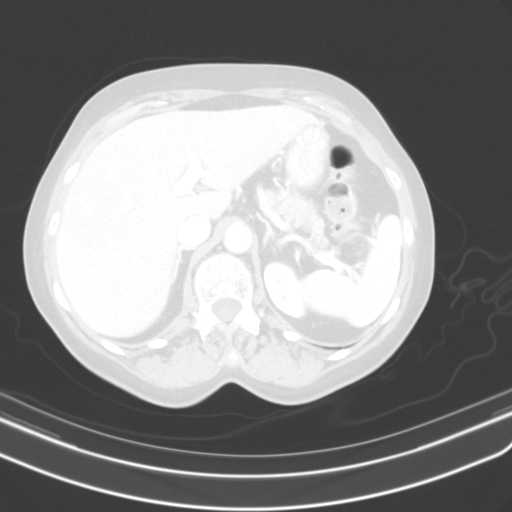
[im 72/84  soft-tissue]
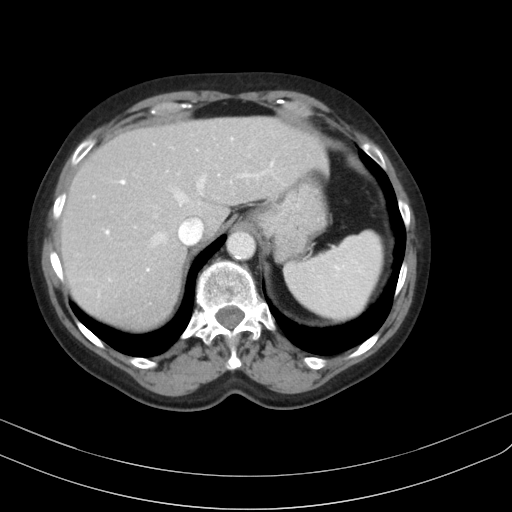
[im 72/84  lung]
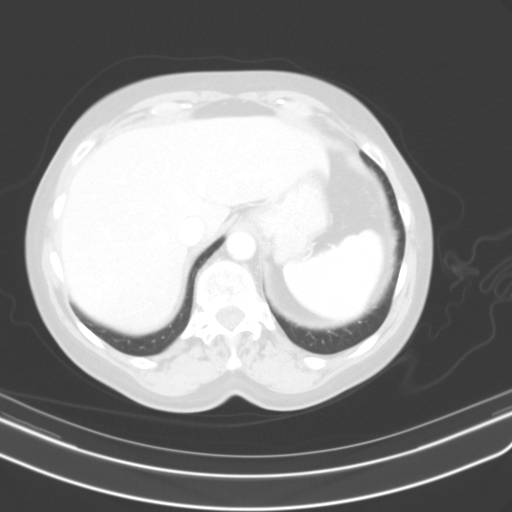
[im 78/84  soft-tissue]
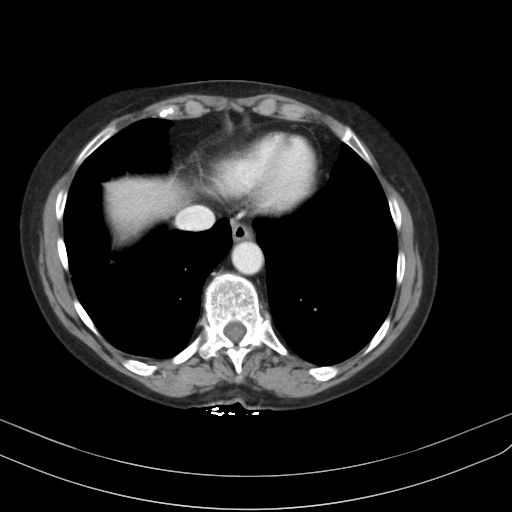
[im 78/84  lung]
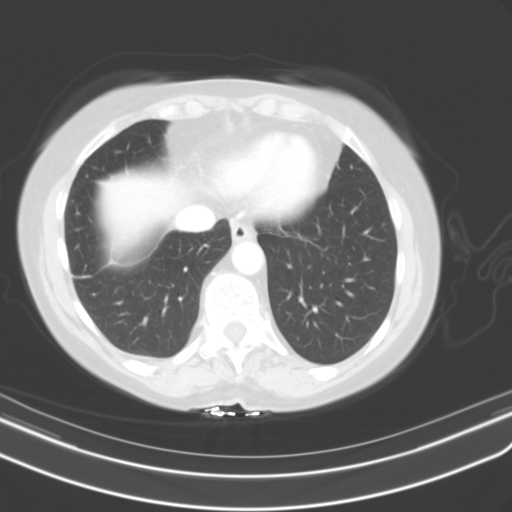

[13 of 32 positions shown; findings below may reference images not displayed]

FINDINGS: Lower chest: Unremarkable

Hepatobiliary: Liver is normal. Post cholecystectomy without signs
of biliary ductal dilation.

Pancreas: No signs of pancreatic lesion or ductal dilation.

Spleen: Normal size without focal lesion.

Adrenals/Urinary Tract: CT appearance of adrenal glands and kidneys
is unremarkable, no signs of hydronephrosis. Urinary bladder is
unremarkable. No renal lesion.

Stomach/Bowel: CT appearance of the gastrointestinal tract is
unremarkable with positive enteric contrast passing through small
bowel and colon to the level of the mid transverse colon.

Vascular/Lymphatic: Scattered atherosclerosis. No signs of aneurysm.
No signs of adenopathy.

Reproductive: Post hysterectomy.

Other: No signs of ascites or free air or fluid.

Musculoskeletal: No acute bone findings are bone process.
IMPRESSION: Start numbering post cholecystectomy, hysterectomy and appendectomy
without acute findings in the abdomen or pelvis.

## 2020-04-08 NOTE — Progress Notes (Signed)
[facility] Counselor/Therapist Progress Note  Patient ID: [name], MRN: [id number],    Date: 04/08/2020  Time Spent: 50 minutes   12:00noon to 12:50pm  Treatment Type: Individual Therapy  Reported Symptoms: Anxiety (a lot of which is currently about her knee surgery tomorrow), depression  Mental Status Exam:  Appearance:   Well Groomed     Behavior:  Appropriate, Sharing and Motivated  Motor:  Normal  Speech/Language:   Clear and Coherent  Affect:  anxious, depressed  Mood:  anxious and depressed  Thought process:  goal directed  Thought content:    WNL  Sensory/Perceptual disturbances:    WNL  Orientation:  oriented to person, place, time/date, situation, day of week, month of year and year  Attention:  Fair  Concentration:  Fair  Memory:  some forgetting and worse  under stress  Fund of knowledge:   Good  Insight:    Good and Fair  Judgment:   Good and Fair  Impulse Control:  Fair   Risk Assessment: Danger to Self:  No Self-injurious Behavior: No Danger to Others: No Duty to Warn:no Physical Aggression / Violence:No  Access to Firearms a concern: No  Gang Involvement:No   Subjective: Patient today reporting depression and anxiety re: surgery tomorrow and family relationships.   Interventions: Cognitive Behavioral Therapy and Solution-Oriented/Positive Psychology  Diagnosis:   ICD-10-CM   1. Generalized anxiety disorder  F41.1     Plan: Patient not signing tx plan updates on computer screen due to [place].  Treatment goals: Goals may remain on tx plan as patient works on strategies to meet her goals.Progress is noted every session in the "Progress" section of Plan.  Long term goal: Develop healthy cognitive patterns and beliefs about self and the world that lead to alleviation of depression and anxiety, and help prevent relapse of depression and anxiety.  Short term goal: Learn and implement personal skills for managing stress, solving  daily problems, and resolving conflicts effectively.  Strategies: Use modelingandrole-playing to help recognizeanxious/depressive/negative thought patterns that createanxious/depressive/negativefeelings and actions, interrupt them and replace with more positive reality-based thoughts that do not support depression.  Progress; Patient in today reporting anxiety and depression.  Anxiety related to her surgery tomorrow and family relationships. Issues with her daughters creating some hurt and lack of understanding and difficult communication, which we processed several situation that occurred recently, in session today. Also very nervous about her knee surgery tomorrow (being put to sleep, the pain afterwards, not being on it for a couple weeks and using crutches/cane, and nervous for husband as he usually needs a lot of help from patient in managing things around the house. Talked about some ways she can be of help to her husband (who has some challenges) in communicating her needs and making helpful lists for him.  Worked some today on specific stressors related to her upcoming surgery, using her short-term goal and strategies in her treatment plan above to help her with some of the more frequent anxious thoughts right now, trying to help her minimize her own personal stress and being able to think more positively about her upcoming surgery and the fact that she has confidence in her doctor as well as a number of friends who are willing to help out as needed.  Encouraged her to practice some of the more positive thoughts as well as more positive self talk, to stay in the present and focus on what she can control, to remain on her  medications as prescribed, setting boundaries as needed with others, enjoy the company of her dog, intentionally look and accentuate positives every day, and staying in touch with people who are supportive of her.   Goal review and progress/challenges noted with  patient.  Next appointment within 3 weeks, depending on her recuperation from surgery.   Shanon Ace, LCSW

## 2020-04-09 DIAGNOSIS — M23222 Derangement of posterior horn of medial meniscus due to old tear or injury, left knee: Secondary | ICD-10-CM | POA: Diagnosis not present

## 2020-04-09 DIAGNOSIS — M958 Other specified acquired deformities of musculoskeletal system: Secondary | ICD-10-CM | POA: Diagnosis not present

## 2020-04-09 DIAGNOSIS — G8918 Other acute postprocedural pain: Secondary | ICD-10-CM | POA: Diagnosis not present

## 2020-04-09 DIAGNOSIS — M23322 Other meniscus derangements, posterior horn of medial meniscus, left knee: Secondary | ICD-10-CM | POA: Diagnosis not present

## 2020-04-09 DIAGNOSIS — M23252 Derangement of posterior horn of lateral meniscus due to old tear or injury, left knee: Secondary | ICD-10-CM | POA: Diagnosis not present

## 2020-04-16 DIAGNOSIS — Z4789 Encounter for other orthopedic aftercare: Secondary | ICD-10-CM | POA: Diagnosis not present

## 2020-04-16 DIAGNOSIS — S83282D Other tear of lateral meniscus, current injury, left knee, subsequent encounter: Secondary | ICD-10-CM | POA: Diagnosis not present

## 2020-04-22 ENCOUNTER — Other Ambulatory Visit: Payer: Self-pay

## 2020-04-22 ENCOUNTER — Ambulatory Visit (INDEPENDENT_AMBULATORY_CARE_PROVIDER_SITE_OTHER): Payer: Medicare Other | Admitting: Psychiatry

## 2020-04-22 DIAGNOSIS — F411 Generalized anxiety disorder: Secondary | ICD-10-CM | POA: Diagnosis not present

## 2020-04-22 NOTE — Progress Notes (Signed)
      [facility] Counselor/Therapist Progress Note  Patient ID: [name], MRN: [id number],    Date: 04/22/2020  Time Spent: 60 minutes    12:00noon to 1:00pm  Treatment Type: Individual Therapy  Reported Symptoms: anxiety, depressed (mostly re: recent surgery)  Mental Status Exam:  Appearance:   Casual     Behavior:  Appropriate, Sharing and Motivated  Motor:  walking slow to to recent surgery  Speech/Language:   Clear and Coherent  Affect:  anxious  Mood:  anxious, depression  Thought process:  goal directed  Thought content:    WNL  Sensory/Perceptual disturbances:    WNL  Orientation:  oriented to person, place, time/date, situation, day of week, month of year and year  Attention:  Good  Concentration:  Good  Memory:  some forgetting and worse under stress  Fund of knowledge:   Good  Insight:    Good  Judgment:   Good  Impulse Control:  Good   Risk Assessment: Danger to Self:  No Self-injurious Behavior: No Danger to Others: No Duty to Warn:no Physical Aggression / Violence:No  Access to Firearms a concern: No  Gang Involvement:No   Subjective: Patient today reporting anxiety and depression. Depression seems more related to recent surgery and difficulties in her recovery.  Interventions: Cognitive Behavioral Therapy and Solution-Oriented/Positive Psychology  Diagnosis:   ICD-10-CM   1. Generalized anxiety disorder  F41.1      Plan: Patient not signing tx plan updates on computer screen due to [place].  Treatment goals: Goals may remain on tx plan as patient works on strategies to meet her goals.Progress is noted every session in the "Progress" section of Plan.  Long term goal: Develop healthy cognitive patterns and beliefs about self and the world that lead to alleviation of depression and anxiety, and help prevent relapse of depression and anxiety.  Short term goal: Learn and implement personal skills for managing stress, solving daily  problems, and resolving conflicts effectively.  Strategies: Use modelingandrole-playing to help recognizeanxious/depressive/negative thought patterns that createanxious/depressive/negativefeelings and actions, interrupt them and replace with more positive reality-based thoughts that do not support depression.  Progress; Patient in today reporting anxiety and depression, with the depression being more related to recent surgery, and also related to some decline patient has noted in husband's functioning with his Parkinson's symptoms. Patient sharing her grief over some of husband's changes and regret that sometimes she's not as "patient as I should be." Relationship issues with daughters  processed some as there's been more distance between them and changes in interpersonal stressors and hurtful comments made. Working to not be manipulated so easily by others, especially others in family. Discussed today ways of setting limits and healthier boundaries with people rather than feeling manipulated by them. Patient felt this was particularly helpful and to practice this between sessions.  Also encouraged patient to continue positive self care, doing enjoyable activies including cooking and reading, practicing positive self-talk, enjoying her dog, looking for positives daily, and staying in touch with those who are supportive of her, and staying in the present and focusing on what she can control versus cannot.  Goal review and progress/challenges noted with patient.  Next appointment within 2 to 3 weeks.   Shanon Ace, LCSW

## 2020-05-06 ENCOUNTER — Other Ambulatory Visit: Payer: Self-pay

## 2020-05-06 ENCOUNTER — Ambulatory Visit (INDEPENDENT_AMBULATORY_CARE_PROVIDER_SITE_OTHER): Payer: Medicare Other | Admitting: Psychiatry

## 2020-05-06 DIAGNOSIS — F411 Generalized anxiety disorder: Secondary | ICD-10-CM | POA: Diagnosis not present

## 2020-05-06 NOTE — Progress Notes (Signed)
      [facility] Counselor/Therapist Progress Note  Patient ID: [name], MRN: [id number],    Date: 05/06/2020  Time Spent: 60 minutes   12:00noon to 1:00pm  Treatment Type: Individual Therapy  Reported Symptoms: anxiety, depression   Mental Status Exam:  Appearance:   Casual     Behavior:  Appropriate, Sharing and Motivated  Motor:  Normal  Speech/Language:   Clear and Coherent  Affect:  anxious, depressed  Mood:  anxious and depressed  Thought process:  goal directed  Thought content:    WNL  Sensory/Perceptual disturbances:    WNL  Orientation:  oriented to person, place, time/date, situation, day of week, month of year and year  Attention:  Good  Concentration:  Good and Fair  Memory:  some forgetting and worse with stress; Dr is aware  Fund of knowledge:   Good  Insight:    Good  Judgment:   Good  Impulse Control:  Good   Risk Assessment: Danger to Self:  No Self-injurious Behavior: No Danger to Others: No Duty to Warn:no Physical Aggression / Violence:No  Access to Firearms a concern: No  Gang Involvement:No   Subjective: Patient today reports anxiety and depression re: personal and husband's illnesses (one of which is Parkinson's).   Interventions: Cognitive Behavioral Therapy and Solution-Oriented/Positive Psychology  Diagnosis:   ICD-10-CM   1. Generalized anxiety disorder  F41.1      Plan: Patient not signing tx plan updates on computer screen due to [place].  Treatment goals: Goals may remain on tx plan as patient works on strategies to meet her goals.Progress is noted every session in the "Progress" section of Plan.  Long term goal: Develop healthy cognitive patterns and beliefs about self and the world that lead to alleviation of depression and anxiety, and help prevent relapse of depression and anxiety.  Short term goal: Learn and implement personal skills for managing stress, solving daily problems, and resolving conflicts  effectively.  Strategies: Use modelingandrole-playing to help recognizeanxious/depressive/negative thought patterns that createanxious/depressive/negativefeelings and actions, interrupt them and replace with more positive reality-based thoughts that do not support depression.  Progress; Patient in today reporting anxiety and depression regarding personal concerns and husband's illness (Parkinson's). Anxiety peaked some as they learned someone else had used their SS# to file taxes on and patient followed up on this in order to file taxes for her and husband. Feels she is getting better with her frustration re: husband's illness.  Is trying to limit how much she watches online and TV news re: world violence as it worries her more.  Picking up from last session she feels is being more understanding of husband and his limitations at times, as she had felt badly about how she was handling some aspects of his illness. Working with her tx goals/strategies to stop letting herself be manipulated by others especially in the family, setting healthy limits and boundaries to reduce manipulation by others, participating in activities that she and husband enjoy, playing with her dog, staying in the present and focusing on what she can control, practice positive self-care and self talk, staying in touch with people that are supportive of her, and letting her faith be a source of emotional support as she experiences difficult circumstances in moving forward.   Goal review and progress noted with patient.  Next appt within 2-3 weeks.   Shanon Ace, LCSW

## 2020-05-08 DIAGNOSIS — M25562 Pain in left knee: Secondary | ICD-10-CM | POA: Diagnosis not present

## 2020-05-13 ENCOUNTER — Other Ambulatory Visit: Payer: Self-pay | Admitting: Psychiatry

## 2020-05-13 DIAGNOSIS — F411 Generalized anxiety disorder: Secondary | ICD-10-CM

## 2020-05-20 ENCOUNTER — Ambulatory Visit (INDEPENDENT_AMBULATORY_CARE_PROVIDER_SITE_OTHER): Payer: Medicare Other | Admitting: Psychiatry

## 2020-05-20 ENCOUNTER — Other Ambulatory Visit: Payer: Self-pay

## 2020-05-20 ENCOUNTER — Encounter: Payer: Self-pay | Admitting: Psychiatry

## 2020-05-20 DIAGNOSIS — F411 Generalized anxiety disorder: Secondary | ICD-10-CM

## 2020-05-20 NOTE — Progress Notes (Signed)
[facility] Counselor/Therapist Progress Note  Patient ID: [name], MRN: [id number],    Date: 05/20/2020  Time Spent: 60 minutes   12:00noon to 1:00pm  Treatment Type: Individual Therapy  Reported Symptoms: Anxiety, depression (some better)  Mental Status Exam:  Appearance:   Casual     Behavior:  Appropriate, Sharing and Motivated  Motor:  Normal  Speech/Language:   Clear and Coherent  Affect:  anxious,some depression  Mood:  anxious and depressed  Thought process:  normal  Thought content:    WNL  Sensory/Perceptual disturbances:    WNL  Orientation:  oriented to person, place, time/date, situation, day of week, month of year and year  Attention:  Good  Concentration:  Good and Fair  Memory:  some forgetting worse under stress  Fund of knowledge:   Good  Insight:    Good  Judgment:   Good  Impulse Control:  Fair   Risk Assessment: Danger to Self:  No Self-injurious Behavior: No Danger to Others: No Duty to Warn:no Physical Aggression / Violence:No  Access to Firearms a concern: No  Gang Involvement:No   Subjective: Patient today reporting anxiety and some depression although the depression has decreased some. States she's still stressing over identity-theft concerns and husband's pending disability application.    Interventions: Cognitive Behavioral Therapy and Solution-Oriented/Positive Psychology  Diagnosis:   ICD-10-CM   1. Generalized anxiety disorder  F41.1      Plan: Patient not signing tx plan updates on computer screen due to [place].  Treatment goals: Goals may remain on tx plan as patient works on strategies to meet her goals.Progress is noted every session in the "Progress" section of Plan.  Long term goal: Develop healthy cognitive patterns and beliefs about self and the world that lead to alleviation of depression and anxiety, and help prevent relapse of depression and anxiety.  Short term goal: Learn and implement personal  skills for managing stress, solving daily problems, and resolving conflicts effectively.  Strategies: Use modelingandrole-playing to help recognizeanxious/depressive/negative thought patterns that createanxious/depressive/negativefeelings and actions, interrupt them and replace with more positive reality-based thoughts that do not support depression.  Progress; Patient in today reporting frustration, anxiety, and some depression.  Anxiety currently is more related to recent Identity Fraud issues that have occurred recently. (Not all details included in this note due to patient privacy needs.  (Patient feeling fearful, angry, frustrated especially as "all I can do is wait to hear back from disability appeals dept, and waiting to also hear about Identity Fraud". Patient quite upset and nervous and processed a lot of her thoughts today especially anger, frustration, and fears of being taken advantage of.  Was able to reach a point of being calmer, feeling more supported and understanding that she does have people trying to help her resolve her concerns.  Is following all steps that has been suggested to her.  Reports as a follow-up from last session that she is dealing some better with certain aspects of her husband's illness, and that feels encouraging to her.  Worked with patient today on her anxious/depressive thoughts, focusing on how to interrupt them and replace them with more positive and reality based thoughts, especially during this time of uncertainty as noted above.  Also encouraged patient to do more of the activities that she normally enjoys, stay in contact with people that are supportive of her, set and keep healthy boundaries with others, enjoy time spent playing with her dog, staying in the present  focusing on what she can control, using her faith as a resource for emotional and spiritual support, practicing more positive self-care and self talk, and realizing the strength she is showing  and working on her goals and moving forward during uncertain and difficult circumstances.   Goal review and progress/challenges noted with patient.  Next appointment within 2 to 3 weeks.   Shanon Ace, LCSW

## 2020-05-22 DIAGNOSIS — N3946 Mixed incontinence: Secondary | ICD-10-CM | POA: Diagnosis not present

## 2020-05-22 DIAGNOSIS — R350 Frequency of micturition: Secondary | ICD-10-CM | POA: Diagnosis not present

## 2020-05-22 DIAGNOSIS — R351 Nocturia: Secondary | ICD-10-CM | POA: Diagnosis not present

## 2020-06-03 ENCOUNTER — Ambulatory Visit (INDEPENDENT_AMBULATORY_CARE_PROVIDER_SITE_OTHER): Payer: Medicare Other | Admitting: Psychiatry

## 2020-06-03 ENCOUNTER — Other Ambulatory Visit: Payer: Self-pay

## 2020-06-03 DIAGNOSIS — F411 Generalized anxiety disorder: Secondary | ICD-10-CM | POA: Diagnosis not present

## 2020-06-03 NOTE — Progress Notes (Signed)
[facility] Counselor/Therapist Progress Note  Patient ID: [name], MRN: [id number],    Date: 06/03/2020  Time Spent: 60 minutes    12:00noon to 1:00pm  Treatment Type: Individual Therapy  Reported Symptoms: anxiety, stressed, depression (some better)  Mental Status Exam:  Appearance:   Casual     Behavior:  Appropriate, Sharing and Motivated  Motor:  Normal  Speech/Language:   Clear and Coherent  Affect:  anxious, some depression  Mood:  anxious and depressed  Thought process:  goal directed  Thought content:    WNL  Sensory/Perceptual disturbances:    WNL  Orientation:  oriented to person, place, time/date, situation, day of week, month of year and year  Attention:  Good  Concentration:  Good and Fair  Memory:  some forgetting but I make lists that helps a lot.  Fund of knowledge:   Good  Insight:    Good and Fair  Judgment:   Good  Impulse Control:  Good   Risk Assessment: Danger to Self:  No Self-injurious Behavior: No Danger to Others: No Duty to Warn:no Physical Aggression / Violence:No  Access to Firearms a concern: No  Gang Involvement:No   Subjective:  Patient today reporting anxiety and some depression.  (See Progress Note below.)  Interventions: Solution-Oriented/Positive Psychology and Ego-Supportive  Diagnosis:   ICD-10-CM   1. Generalized anxiety disorder  F41.1      Plan: Patient not signing tx plan updates on computer screen due to [place].  Treatment goals: Goals may remain on tx plan as patient works on strategies to meet her goals.Progress is noted every session in the "Progress" section of Plan.  Long term goal: Develop healthy cognitive patterns and beliefs about self and the world that lead to alleviation of depression and anxiety, and help prevent relapse of depression and anxiety.  Short term goal: Learn and implement personal skills for managing stress, solving daily problems, and resolving conflicts  effectively.  Strategies: Use modelingandrole-playing to help recognizeanxious/depressive/negative thought patterns that createanxious/depressive/negativefeelings and actions, interrupt them and replace with more positive reality-based thoughts that do not support depression.  Progress; Patient in today reporting daily anxiety, frustration, and some depression mostly related to ongoing personal and family concerns.  Was calmer today about the previous "identity fraud" issues.  Reports that depression is some better.  Interactions with a daughter continues to be challenging in being demanding and difficulty in communication. Daughter and family spent weekend recently with patient and patient discussed today some of the issues that arose during visit, including needed boundaries and communication problems. Worked in session with some specific examples of boundary and communication issues.  Assisted patient in looking at how to establish better boundaries with others and.  Also worked on communications and how to be consistent when setting boundaries.  Continues to try and manage certain aspects of her husband's illness better, and she feels she is making some progress on that.  Encouraged patient to continue working on anxious/depressive thoughts and changing them to be more reality based and encouraging, get outside some daily and walk as she is able, stay in contact with people that are supportive of her, enjoy time spent playing with her dog, staying in the present focusing on what she can control, practicing more positive self talk and self-care, using her faith as a resource for emotional and spiritual support, intentionally looking for more positives daily, and to feel good about the strength she is showing as she works on goal-directed  behaviors in the midst of uncertain and difficult circumstances.   Goal review and progress/challenges noted with patient.  Next appointment within 2 to 3  weeks.   Shanon Ace, LCSW

## 2020-06-17 ENCOUNTER — Other Ambulatory Visit: Payer: Self-pay

## 2020-06-17 ENCOUNTER — Ambulatory Visit (INDEPENDENT_AMBULATORY_CARE_PROVIDER_SITE_OTHER): Payer: Medicare Other | Admitting: Psychiatry

## 2020-06-17 DIAGNOSIS — F411 Generalized anxiety disorder: Secondary | ICD-10-CM

## 2020-06-17 NOTE — Progress Notes (Signed)
[facility] Counselor/Therapist Progress Note  Patient ID: [name], MRN: [id number],    Date: 06/17/2020  Time Spent: 60 minutes   12:00noon to 1:00pm   Treatment Type: Individual Therapy  Reported Symptoms: anxiety, depression, some communication issues in marriage   Mental Status Exam:  Appearance:   Casual     Behavior:  Appropriate, Sharing and Motivated  Motor:  Normal  Speech/Language:   Clear and Coherent  Affect:  anxious, depressed  Mood:  anxious and depressed  Thought process:  goal directed  Thought content:    WNL  Sensory/Perceptual disturbances:    WNL  Orientation:  oriented to person, place, time/date, situation, day of week, month of year and year  Attention:  Good  Concentration:  Good and Fair  Memory:  some forgetting and worse under stress  Fund of knowledge:   Good  Insight:    Good  Judgment:   Good  Impulse Control:  Good   Risk Assessment: Danger to Self:  No Self-injurious Behavior: No Danger to Others: No Duty to Warn:no Physical Aggression / Violence:No  Access to Firearms a concern: No  Gang Involvement:No   Subjective: Patient in today reporting anxiety and depression with anxiety being the stronger symptom.  "I feel like I handle things as well as I can,  but struggled with anxiety and depression more due to added financial concerns and some family issues which have escalated recently. (See Progress Note below.)    Interventions: Solution-Oriented/Positive Psychology and Ego-Supportive  Diagnosis:   ICD-10-CM   1. Generalized anxiety disorder  F41.1      Plan: Patient not signing tx plan updates on computer screen due to [place]. Treatment goals: Goals may remain on tx plan as patient works on strategies to meet her goals.Progress is noted every session in the "Progress" section of Plan.  Long term goal: Develop healthy cognitive patterns and beliefs about self and the world that lead to alleviation of  depression and anxiety, and help prevent relapse of depression and anxiety.  Short term goal: Learn and implement personal skills for managing stress, solving daily problems, and resolving conflicts effectively.  Strategies: Use modelingandrole-playing to help recognizeanxious/depressive/negative thought patterns that createanxious/depressive/negativefeelings and actions, interrupt them and replace with more positive reality-based thoughts that do not support depression.  Progress; Patient in today reporting daily anxiety and depression which has escalated some due to increased financial stressors, continued knee problems after recent knee surgery, increasing hair loss, and family concerns especially with an adult daughter and husband's health challenges (Parkinson's, COPY, anxiety, depression).  Does have an upcoming appointment with her orthopedic doctor about her knee, an appointment with her dermatologist about continued hair loss, and it is anticipated that the increased financial stressors are temporary.  In session today we focused more on relationship issues with her an adult daughter,  husband's health challenges that has impacted their marriage more in recent weeks, and healthier communication within the marriage.  With adult daughter, patient is finding it challenging as daughter can be demanding, but patient is working today to be more clear and boundary conscious with adult daughter.  Discussed some communication skills in session today that might help her in relationship with adult daughter, emphasizing the need for both of them to focus more on listening than speaking, so that they can both feel heard and so that they are listening to really hear the other person rather than listening just to respond.  Also used time in  session today to work with patient on more acceptance issues with her husband's health challenges, but also ways that their communication and feeling heard could be  increased through more direct speaking with each other and again focusing on listening to really hear rather than listening just to respond.  Also looked at ways of discussing things that are not easy for her to discuss within their marriage, and doing it in a way where both parties can engage in the conversation and neither of them feel hurt nor minimized, and both understand that the open communication is meant to strengthen their marriage rather than be hurtful to either of them.  Patient seemed to find this quite helpful and plans to follow-up between sessions with husband.  Encouraged patient to practice some different communication strategies between sessions with her husband and with adult daughter and be able to share this next session.  Also encouraged her to remain in contact with people that are supportive of her, to get outside some every day and walk as she is able and as doctor recommends, to continue interrupting anxious/depressive thoughts and replacing them to be more realistic and encouraging, to stay in the present focusing on what she can control versus cannot, enjoy time spent playing with her dog, using her faith as a resource for emotional and spiritual support, practicing positive self talk and self-care, intentionally looking for positives daily, and to realize the strength she is showing as she works on goal-directed behaviors in order to move forward in the midst of challenging circumstances.  Goal review and progress/challenges noted with patient.  Next appointment within 2 to 3 weeks.   Shanon Ace, LCSW

## 2020-06-20 DIAGNOSIS — Z23 Encounter for immunization: Secondary | ICD-10-CM | POA: Diagnosis not present

## 2020-06-21 DIAGNOSIS — M5030 Other cervical disc degeneration, unspecified cervical region: Secondary | ICD-10-CM | POA: Diagnosis not present

## 2020-06-21 DIAGNOSIS — M7918 Myalgia, other site: Secondary | ICD-10-CM | POA: Diagnosis not present

## 2020-06-21 DIAGNOSIS — G8929 Other chronic pain: Secondary | ICD-10-CM | POA: Diagnosis not present

## 2020-06-21 DIAGNOSIS — M47812 Spondylosis without myelopathy or radiculopathy, cervical region: Secondary | ICD-10-CM | POA: Diagnosis not present

## 2020-07-01 ENCOUNTER — Other Ambulatory Visit: Payer: Self-pay

## 2020-07-01 ENCOUNTER — Ambulatory Visit (INDEPENDENT_AMBULATORY_CARE_PROVIDER_SITE_OTHER): Payer: Medicare Other | Admitting: Psychiatry

## 2020-07-01 DIAGNOSIS — F411 Generalized anxiety disorder: Secondary | ICD-10-CM | POA: Diagnosis not present

## 2020-07-01 NOTE — Progress Notes (Signed)
[facility] Counselor/Therapist Progress Note  Patient ID: [name], MRN: [id number],    Date: 07/01/2020  Time Spent: 60 minutes    12:00noon to 1:00pm  Treatment Type: Individual Therapy  Reported Symptoms: Anxiety, some depression  Mental Status Exam:  Appearance:   Casual     Behavior:  Appropriate, Sharing and Motivated  Motor:  Normal  Speech/Language:   Clear and Coherent  Affect:  anxiety, some depression  Mood:  anxious and depressed  Thought process:  goal directed  Thought content:    WNL  Sensory/Perceptual disturbances:    WNL  Orientation:  oriented to person, place, time/date, situation, day of week, month of year and year  Attention:  Fair  Concentration:  Good/Fair  Memory:  some forgetting --Dr.is aware  Fund of knowledge:   Good  Insight:    Good  Judgment:   Good  Impulse Control:  Good   Risk Assessment: Danger to Self:  No Self-injurious Behavior: No Danger to Others: No Duty to Warn:no Physical Aggression / Violence:No  Access to Firearms a concern: No  Gang Involvement:No   Subjective: Patient today reports daily anxiety and some depression. Feeling the pressure of financial tightness some. See Progress Note below.  Interventions: Solution-Oriented/Positive Psychology and Ego-Supportive  Diagnosis:   ICD-10-CM   1. Generalized anxiety disorder  F41.1      Plan: Patient not signing tx plan updates on computer screen due to [place]. Treatment goals: Goals may remain on tx plan as patient works on strategies to meet her goals.Progress is noted every session in the "Progress" section of Plan.  Long term goal: Develop healthy cognitive patterns and beliefs about self and the world that lead to alleviation of depression and anxiety, and help prevent relapse of depression and anxiety.  Short term goal: Learn and implement personal skills for managing stress, solving daily problems, and resolving conflicts  effectively.  Strategies: Use modelingandrole-playing to help recognizeanxious/depressive/negative thought patterns that createanxious/depressive/negativefeelings and actions, interrupt them and replace with more positive reality-based thoughts that do not support depression.  Progress; Patient in today reporting daily anxiety and some depression.  Relationship issues within family, mostly with 1 of her adult daughters, and is very upsetting for patient, and we worked on this in session today helping patient work on appropriate boundaries and limits in relationship with daughter as well as some strategies that may help her involving communication skills and how to say some things in a way that are easier to accepts. Worked specifically on her communication with daughter and her being able to set appropriate boundaries and also communicate her care for daughter. Patient sharing a lot of concerns about daughter, and some about her husband and his Parkinson' and COPD issues he is experiencing.  Postsurgical pain still occurring with her knee if she walks very much, and is to get a "get injection" soon. Patient encouraged to focus more on the communication skills discussed today and practice them as she has the opportunity especially with her daughter.  Encouraged her to daily get outside even though she may not be able to walk much due to her knee surgery, she notices it helps her mood to be outside.  Also encouraged her to remain in contact with people that are supportive of her, to continue intercepting anxious/depressive thoughts and replacing them with more reality based and empowering thoughts, to enjoy time spent playing with her dog, to stay in the present focusing on what she can control  versus cannot, to let her faith be a part of her emotional support, and to feel good about the strength that she is demonstrating and working on her goals to move forward in a more positive direction especially  with the challenges she faces each day.  Goal review and progress/challenges noted with patient.  Next appointment within 2 to 3 weeks.   Shanon Ace, LCSW

## 2020-07-03 DIAGNOSIS — R350 Frequency of micturition: Secondary | ICD-10-CM | POA: Diagnosis not present

## 2020-07-03 DIAGNOSIS — R109 Unspecified abdominal pain: Secondary | ICD-10-CM | POA: Diagnosis not present

## 2020-07-03 DIAGNOSIS — N3946 Mixed incontinence: Secondary | ICD-10-CM | POA: Diagnosis not present

## 2020-07-03 DIAGNOSIS — Z882 Allergy status to sulfonamides status: Secondary | ICD-10-CM | POA: Diagnosis not present

## 2020-07-03 DIAGNOSIS — Z885 Allergy status to narcotic agent status: Secondary | ICD-10-CM | POA: Diagnosis not present

## 2020-07-03 DIAGNOSIS — Z886 Allergy status to analgesic agent status: Secondary | ICD-10-CM | POA: Diagnosis not present

## 2020-07-03 DIAGNOSIS — Z881 Allergy status to other antibiotic agents status: Secondary | ICD-10-CM | POA: Diagnosis not present

## 2020-07-03 DIAGNOSIS — K219 Gastro-esophageal reflux disease without esophagitis: Secondary | ICD-10-CM | POA: Diagnosis not present

## 2020-07-03 DIAGNOSIS — Z79899 Other long term (current) drug therapy: Secondary | ICD-10-CM | POA: Diagnosis not present

## 2020-07-10 DIAGNOSIS — M533 Sacrococcygeal disorders, not elsewhere classified: Secondary | ICD-10-CM | POA: Diagnosis not present

## 2020-07-10 DIAGNOSIS — G8929 Other chronic pain: Secondary | ICD-10-CM | POA: Diagnosis not present

## 2020-07-15 ENCOUNTER — Ambulatory Visit (INDEPENDENT_AMBULATORY_CARE_PROVIDER_SITE_OTHER): Payer: Medicare Other | Admitting: Psychiatry

## 2020-07-15 ENCOUNTER — Other Ambulatory Visit: Payer: Self-pay

## 2020-07-15 DIAGNOSIS — F411 Generalized anxiety disorder: Secondary | ICD-10-CM

## 2020-07-15 NOTE — Progress Notes (Signed)
[facility] Counselor/Therapist Progress Note  Patient ID: [name], MRN: [id number],    Date: 07/15/2020  Time Spent:  60 minutes    12:00noon to 1:00pm  Treatment Type: Individual Therapy  Reported Symptoms: anxiety, depression, short tempered  Mental Status Exam:  Appearance:   Casual and Neat     Behavior:  Appropriate, Sharing and Motivated  Motor:  Normal  Speech/Language:   Clear and Coherent  Affect:  anxiety, depression  Mood:  anxious and depressed  Thought process:  normal  Thought content:    WNL  Sensory/Perceptual disturbances:    WNL  Orientation:  oriented to person, place, time/date, situation, day of week, month of year and year  Attention:  Good  Concentration:  Good  Memory:  WNL; later states she's noticed trying to find words in conversation and sometimes worse under stress  Fund of knowledge:   Good  Insight:    Good  Judgment:   Good  Impulse Control:  Good   Risk Assessment: Danger to Self:  No Self-injurious Behavior: No Danger to Others: No Duty to Warn:no Physical Aggression / Violence:No  Access to Firearms a concern: No  Gang Involvement:No   Subjective:  Patient in today reporting anxiety, depression, and short-tempered. Financial stressors, difficulties in marital relationship, which is feeding her anxiety/depression/and short-temperedness. Also having challenges with her oldest daughter (71) in communication and interpersonal differences.  Patient states she's "not as deeply depressed as in the past" which is improvement for her. Looking to build on her strengths and progress, patient focusing more on her marital relationship with some specific issues and isalso getting some medical support in working on that issue; (not all details included in this note due to patient privacy needs.) Patient very concerned today about financial struggles and needed to share more openly which she did to get support and suggestions to better manage  the stress involved for her.  Worked with some CBT, and patient's long and short-term goal on treatment plan below.  Focused on communicating with a daughter that can be very demanding, and how patient can set limits that will help her to feel more comfortable and not driven to meet other's expectations, within the family and beyond.  Did seem more grounded and self assured upon her leaving session today and closer to a 50 on SUDS (Subjective Units of Distress Scale).    Interventions: Solution-Oriented/Positive Psychology and Ego-Supportive  Diagnosis:   ICD-10-CM   1. Generalized anxiety disorder  F41.1     Treatment  Plan: Patient not signing tx plan updates on computer screen due to [place]. Treatment goals: Goals may remain on tx plan as patient works on strategies to meet her goals.Progress is noted every session in the "Progress" section of Plan. Long term goal: Develop healthy cognitive patterns and beliefs about self and the world that lead to alleviation of depression and anxiety, and help prevent relapse of depression and anxiety. Short term goal: Learn and implement personal skills for managing stress, solving daily problems, and resolving conflicts effectively. Strategies: Use modelingandrole-playing to help recognizeanxious/depressive/negative thought patterns that createanxious/depressive/negativefeelings and actions, interrupt them and replace with more positive reality-based thoughts that do not support depression.    Progress / Plan:  Patient showing some progress in her goal-directed behaviors and in trying to set healthy boundaries within the family and beyond, including how to manage times when strong-willed people within the family communicate in more demanding ways.  Concerns remain with her  husband's Parkinson's disease and COPD.  Patient's knee is improving, postsurgically.  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  Encouraged patient to stay in contact with people that are supportive of her, to enjoy time spent with friends and her dog, to stay in the present trouble or change as she is able, to work on interrupting, to let faith be a part of her emotional healing and support and to realize the strength she is showing as she works on goal directed behaviors daily in the midst of challenging circumstances and trying to move forward in a positive direction.   Goal review and progress/challenges noted with patient.  Next appointment within 2 to 3 weeks.   Shanon Ace, LCSW

## 2020-07-16 ENCOUNTER — Telehealth: Payer: Medicare Other | Admitting: Psychiatry

## 2020-07-25 DIAGNOSIS — M25562 Pain in left knee: Secondary | ICD-10-CM | POA: Diagnosis not present

## 2020-07-25 DIAGNOSIS — M1712 Unilateral primary osteoarthritis, left knee: Secondary | ICD-10-CM | POA: Diagnosis not present

## 2020-07-26 ENCOUNTER — Other Ambulatory Visit: Payer: Self-pay | Admitting: Psychiatry

## 2020-07-26 DIAGNOSIS — F411 Generalized anxiety disorder: Secondary | ICD-10-CM

## 2020-07-26 DIAGNOSIS — F5101 Primary insomnia: Secondary | ICD-10-CM

## 2020-07-26 NOTE — Telephone Encounter (Signed)
Last filled 06/13/20

## 2020-07-30 ENCOUNTER — Ambulatory Visit: Payer: PRIVATE HEALTH INSURANCE | Admitting: Psychiatry

## 2020-07-31 DIAGNOSIS — R1013 Epigastric pain: Secondary | ICD-10-CM | POA: Diagnosis not present

## 2020-07-31 DIAGNOSIS — M47812 Spondylosis without myelopathy or radiculopathy, cervical region: Secondary | ICD-10-CM | POA: Diagnosis not present

## 2020-08-01 DIAGNOSIS — M1712 Unilateral primary osteoarthritis, left knee: Secondary | ICD-10-CM | POA: Diagnosis not present

## 2020-08-08 DIAGNOSIS — M1712 Unilateral primary osteoarthritis, left knee: Secondary | ICD-10-CM | POA: Diagnosis not present

## 2020-08-12 ENCOUNTER — Ambulatory Visit: Payer: Medicare Other | Admitting: Psychiatry

## 2020-08-15 ENCOUNTER — Other Ambulatory Visit: Payer: Self-pay

## 2020-08-15 ENCOUNTER — Ambulatory Visit (INDEPENDENT_AMBULATORY_CARE_PROVIDER_SITE_OTHER): Payer: Medicare Other | Admitting: Psychiatry

## 2020-08-15 DIAGNOSIS — F411 Generalized anxiety disorder: Secondary | ICD-10-CM
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
## 2020-08-19 ENCOUNTER — Ambulatory Visit (INDEPENDENT_AMBULATORY_CARE_PROVIDER_SITE_OTHER): Payer: Medicare Other | Admitting: Dermatology

## 2020-08-19 ENCOUNTER — Other Ambulatory Visit: Payer: Self-pay

## 2020-08-19 ENCOUNTER — Other Ambulatory Visit: Payer: Self-pay | Admitting: Dermatology

## 2020-08-19 ENCOUNTER — Encounter: Payer: Self-pay | Admitting: Dermatology

## 2020-08-19 DIAGNOSIS — Z86018 Personal history of other benign neoplasm: Secondary | ICD-10-CM

## 2020-08-19 DIAGNOSIS — Z1283 Encounter for screening for malignant neoplasm of skin: Secondary | ICD-10-CM | POA: Diagnosis not present

## 2020-08-19 DIAGNOSIS — D1801 Hemangioma of skin and subcutaneous tissue: Secondary | ICD-10-CM | POA: Diagnosis not present

## 2020-08-19 DIAGNOSIS — L570 Actinic keratosis: Secondary | ICD-10-CM | POA: Diagnosis not present

## 2020-08-19 DIAGNOSIS — L821 Other seborrheic keratosis: Secondary | ICD-10-CM

## 2020-08-19 DIAGNOSIS — Z87898 Personal history of other specified conditions: Secondary | ICD-10-CM

## 2020-08-19 DIAGNOSIS — D485 Neoplasm of uncertain behavior of skin: Secondary | ICD-10-CM

## 2020-08-19 NOTE — Patient Instructions (Signed)

## 2020-08-22 DIAGNOSIS — N3946 Mixed incontinence: Secondary | ICD-10-CM | POA: Diagnosis not present

## 2020-08-22 DIAGNOSIS — R350 Frequency of micturition: Secondary | ICD-10-CM | POA: Diagnosis not present

## 2020-08-25 ENCOUNTER — Encounter: Payer: Self-pay | Admitting: Dermatology

## 2020-08-25 NOTE — Progress Notes (Signed)
   Follow-Up Visit   Subjective  [name] is a 70 y.o. female who presents for the following: Annual Exam (Raised lesion x 1 year horn like,patient stated she had bcc in the past pink lesions on the nose. Per the paper chart mild atypia right calf and right buttock).  Annual skin check, new crust on chest, history of atypical moles Location:  Duration:  Quality:  Associated Signs/Symptoms: Modifying Factors:  Severity:  Timing: Context:   Objective  Well appearing patient in no apparent distress; mood and affect are within normal limits. Right Hip (side) - Posterior No sign repigmentation  Left Inguinal Area Sun exposed areas, chest, and back examined: No atypical pigmented lesions.  1 possible nonmelanoma skin cancer left chest will be biopsied.  Mid Back Right side + picks; lichenified flattopped keratotic 6 mm papules.  left Neck - Anterior 3 cm to seb k inferior; 6 mm bright hornlike lesion on pink base.     Right Thigh - Posterior 2 mm smooth red papule    All sun exposed areas plus back examined.  Plus chest.   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  Neoplasm of uncertain behavior of skin left Neck - Anterior  Skin / nail biopsy Type of biopsy: tangential   Informed consent: discussed and consent obtained   Timeout: patient name, date of birth, surgical site, and procedure verified   Anesthesia: the lesion was anesthetized in a standard fashion   Anesthetic:  1% lidocaine w/ epinephrine 1-100,000 local infiltration Instrument used: flexible razor blade   Hemostasis achieved with: aluminum chloride and electrodesiccation   Outcome: patient tolerated procedure well   Post-procedure details: wound care  instructions given    Specimen 1 - Surgical pathology Differential Diagnosis: isk   Check Margins: No  Cherry angioma Right Thigh - Posterior  No intervention indicated      I, Lavonna Monarch, MD, have reviewed all documentation for this visit.  The documentation on 08/25/20 for the exam, diagnosis, procedures, and orders are all accurate and complete.

## 2020-08-26 ENCOUNTER — Ambulatory Visit (INDEPENDENT_AMBULATORY_CARE_PROVIDER_SITE_OTHER): Payer: Medicare Other | Admitting: Psychiatry

## 2020-08-26 ENCOUNTER — Other Ambulatory Visit: Payer: Self-pay

## 2020-08-26 DIAGNOSIS — F411 Generalized anxiety disorder: Secondary | ICD-10-CM | POA: Diagnosis not present
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
## 2020-08-28 DIAGNOSIS — M47812 Spondylosis without myelopathy or radiculopathy, cervical region: Secondary | ICD-10-CM | POA: Diagnosis not present

## 2020-09-09 ENCOUNTER — Ambulatory Visit: Payer: Medicare Other | Admitting: Psychiatry

## 2020-09-13 ENCOUNTER — Ambulatory Visit (INDEPENDENT_AMBULATORY_CARE_PROVIDER_SITE_OTHER): Payer: Medicare Other | Admitting: Psychiatry

## 2020-09-13 ENCOUNTER — Other Ambulatory Visit: Payer: Self-pay

## 2020-09-13 ENCOUNTER — Encounter: Payer: Self-pay | Admitting: Psychiatry

## 2020-09-13 DIAGNOSIS — F330 Major depressive disorder, recurrent, mild: Secondary | ICD-10-CM

## 2020-09-13 DIAGNOSIS — F411 Generalized anxiety disorder: Secondary | ICD-10-CM | POA: Diagnosis not present

## 2020-09-13 DIAGNOSIS — F5101 Primary insomnia: Secondary | ICD-10-CM

## 2020-09-13 MED ORDER — DULOXETINE HCL 60 MG PO CPEP: 60.0000 mg | ORAL_CAPSULE | Freq: Every day | ORAL | 1 refills | Status: DC

## 2020-09-13 MED ORDER — LAMOTRIGINE 150 MG PO TABS: 150.0000 mg | ORAL_TABLET | Freq: Every day | ORAL | 1 refills | Status: DC

## 2020-09-13 MED ORDER — LORAZEPAM 1 MG PO TABS: ORAL_TABLET | ORAL | 5 refills | Status: DC

## 2020-09-13 MED ORDER — BUSPIRONE HCL 15 MG PO TABS: 15.0000 mg | ORAL_TABLET | Freq: Two times a day (BID) | ORAL | 1 refills | Status: DC
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
## 2020-09-20 ENCOUNTER — Encounter (HOSPITAL_COMMUNITY): Payer: Medicare Other

## 2020-09-20 DIAGNOSIS — M47812 Spondylosis without myelopathy or radiculopathy, cervical region: Secondary | ICD-10-CM | POA: Diagnosis not present

## 2020-09-23 ENCOUNTER — Other Ambulatory Visit (HOSPITAL_COMMUNITY): Payer: Self-pay | Admitting: *Deleted

## 2020-09-24 ENCOUNTER — Encounter (HOSPITAL_COMMUNITY): Payer: Medicare Other

## 2020-09-25 ENCOUNTER — Ambulatory Visit (HOSPITAL_COMMUNITY)
Admission: RE | Admit: 2020-09-25 | Discharge: 2020-09-25 | Disposition: A | Discharge status: Home / Self Care | Payer: Medicare Other | Source: Ambulatory Visit | Attending: Internal Medicine | Admitting: Internal Medicine

## 2020-09-25 ENCOUNTER — Other Ambulatory Visit: Payer: Self-pay

## 2020-09-25 DIAGNOSIS — M810 Age-related osteoporosis without current pathological fracture: Secondary | ICD-10-CM | POA: Insufficient documentation

## 2020-09-25 MED ORDER — DENOSUMAB 60 MG/ML ~~LOC~~ SOSY
PREFILLED_SYRINGE | SUBCUTANEOUS | Status: AC
  Filled 2020-09-25: qty 1

## 2020-09-25 MED ORDER — DENOSUMAB 60 MG/ML ~~LOC~~ SOSY
60.0000 mg | PREFILLED_SYRINGE | Freq: Once | SUBCUTANEOUS | Status: AC
  Administered 2020-09-25: 60 mg via SUBCUTANEOUS

## 2020-09-26 ENCOUNTER — Ambulatory Visit (INDEPENDENT_AMBULATORY_CARE_PROVIDER_SITE_OTHER): Payer: Medicare Other | Admitting: Psychiatry

## 2020-09-26 DIAGNOSIS — F411 Generalized anxiety disorder: Secondary | ICD-10-CM | POA: Diagnosis not present

## 2020-09-26 NOTE — Progress Notes (Signed)
[facility] Counselor/Therapist Progress Note  Patient ID: [name], MRN: [id number],    Date: 09/26/2020  Time Spent: 58 minutes   Treatment Type: Individual Therapy  Reported Symptoms: anxiety,some depression  Mental Status Exam:  Appearance:   Casual     Behavior:  Appropriate, Sharing, and Motivated  Motor:  Normal  Speech/Language:   Clear and Coherent  Affect:  Anxious, depressed  Mood:  anxious and depressed  Thought process:  normal  Thought content:    WNL  Sensory/Perceptual disturbances:    WNL  Orientation:  oriented to person, place, time/date, situation, day of week, month of year, year, and stated date of September 26, 2020  Attention:  Good  Concentration:  Good  Memory:  Some forgetting at times, more under stress  Fund of knowledge:   Fair  Insight:    Good  Judgment:   Good  Impulse Control:  Good   Risk Assessment: Danger to Self:  No Self-injurious Behavior: No Danger to Others: No Duty to Warn:no Physical Aggression / Violence:No  Access to Firearms a concern: No  Gang Involvement:No   Subjective:   Patient in today reporting anxiety and depression. Noticing her balance isn't as good as usual and has fallen recently but not hurt. "Losing words sometimes" and gets worried about this.  "Starting to worry me." Processed her anxious/fearful thoughts about her own health and her husband's health, which seemed to be helpful to patient, especially in being able to openly express her concerns without judgement. More pain with knee, hip, and aware of aging more, which she processed her feelings about that as well.  Seemed some more upbeat and less anxious upon leaving session after discussing the above concerns as she has few outlets for talking confidentially about her situation.  Did encourage her with her concerns about communication with husband, for her to be more direct and not assume he always understands what she is saying or needing.  She agrees to  try that before next session.  Lots of challenges continue with husband's Parkinson's disease for whom patient is his main support and caretaker.  She shared some additional stressors about this part of her relationship with husband and was able to feel supported and encouraged not only in what she is doing for him but also encouraged her in ways that she can take better care of herself physically and emotionally.   Interventions: Cognitive Behavioral Therapy and Ego-Supportive  Diagnosis:   ICD-10-CM   1. Generalized anxiety disorder  F41.1       Treatment  Plan:   Patient not signing tx plan updates on computer screen due to [place].  Treatment goals: Goals may remain on tx plan as patient works on strategies to meet her goals. Progress is noted every session in the "Progress" section of Plan. Long term goal: Develop healthy cognitive patterns and beliefs about self and the world that lead to alleviation of depression and anxiety, and help prevent relapse of depression and anxiety. Short term goal: Learn and implement personal skills for managing stress, solving daily problems, and resolving conflicts effectively.  Strategies: Use modeling and role-playing to help recognize anxious/depressive/negative thought patterns that create anxious/depressive/negative feelings and actions, interrupt them and replace with more positive reality-based thoughts that do not support depression.      Plan:  Patient today showing good motivation and participation in session today.  She shared good follow through on some goal-directed behaviors we had discussed last session  and is practicing better boundaries within her family.  Courage patient to continue to new using behaviors that have proven to be helpful before between sessions including: Getting outside daily to walk, allowing her faith to be a part of her emotional healing as well as spiritual, spending enjoyable time with her friends and dog, continue  work on interrupting anxious/negative/depressive thoughts and replacing them with more realistic and empowering thoughts, staying in contact with people that are supportive of her and her husband, to stay in the present focusing on what she can change, have healthy boundaries with others as needed including within her church, and recognize the strength she is showing and working with goal-directed behaviors in the midst of stressful circumstances as she supports her husband through his physical challenges and also in trying to move forward herself in a more positive direction towards improved emotional and physical health.  Goal review and progress/challenges noted with patient.  Next appointment within 3 weeks.  Shanon Ace, LCSW

## 2020-10-04 DIAGNOSIS — Z1231 Encounter for screening mammogram for malignant neoplasm of breast: Secondary | ICD-10-CM | POA: Diagnosis not present

## 2020-10-09 DIAGNOSIS — K317 Polyp of stomach and duodenum: Secondary | ICD-10-CM | POA: Diagnosis not present

## 2020-10-09 DIAGNOSIS — K2960 Other gastritis without bleeding: Secondary | ICD-10-CM | POA: Diagnosis not present

## 2020-10-09 DIAGNOSIS — K208 Other esophagitis without bleeding: Secondary | ICD-10-CM | POA: Diagnosis not present

## 2020-10-09 DIAGNOSIS — R1013 Epigastric pain: Secondary | ICD-10-CM | POA: Diagnosis not present

## 2020-10-09 DIAGNOSIS — K2950 Unspecified chronic gastritis without bleeding: Secondary | ICD-10-CM | POA: Diagnosis not present

## 2020-10-09 DIAGNOSIS — K2970 Gastritis, unspecified, without bleeding: Secondary | ICD-10-CM | POA: Diagnosis not present

## 2020-10-11 DIAGNOSIS — R0789 Other chest pain: Secondary | ICD-10-CM | POA: Diagnosis not present

## 2020-10-11 DIAGNOSIS — K219 Gastro-esophageal reflux disease without esophagitis: Secondary | ICD-10-CM | POA: Diagnosis not present

## 2020-10-11 DIAGNOSIS — K222 Esophageal obstruction: Secondary | ICD-10-CM | POA: Diagnosis not present

## 2020-10-14 ENCOUNTER — Other Ambulatory Visit: Payer: Self-pay

## 2020-10-14 ENCOUNTER — Ambulatory Visit (INDEPENDENT_AMBULATORY_CARE_PROVIDER_SITE_OTHER): Payer: Medicare Other | Admitting: Psychiatry

## 2020-10-14 DIAGNOSIS — F411 Generalized anxiety disorder: Secondary | ICD-10-CM | POA: Diagnosis not present

## 2020-10-14 NOTE — Progress Notes (Signed)
[facility] Counselor/Therapist Progress Note  Patient ID: SHARAN SIRAGUSA, MRN: [id number],    Date: 10/14/2020  Time Spent: 58 minutes   Treatment Type: Individual Therapy  Reported Symptoms: anxiety, some depression  Mental Status Exam:  Appearance:   Casual     Behavior:  Appropriate, Sharing, and Motivated  Motor:  Normal  Speech/Language:   Clear and Coherent  Affect:  Anxiety, some depression  Mood:  anxious and depressed  Thought process:  goal directed  Thought content:    WNL  Sensory/Perceptual disturbances:    WNL  Orientation:  oriented to person, place, time/date, situation, day of week, month of year, year, and stated date of Aug. 15, 2022  Attention:  Good  Concentration:  Good  Memory:  WNL  Fund of knowledge:   Good  Insight:    Good  Judgment:   Good  Impulse Control:  Good   Risk Assessment: Danger to Self:  NO Self-injurious Behavior: No Danger to Others: No Duty to Warn:no Physical Aggression / Violence:No  Access to Firearms a concern: No  Gang Involvement:No   Subjective:   Patient in today reporting anxiety and some depression due to personal, family, and health related issues. Fears a new medication is causing her balance problems and is going to check with her doctor. "Doesn't want to stop the med unless I have to because I'm able to get it for free." Plus that medicine seems to be helping her incontience. To talk further with her doctor about her balance concerns. Processed her anxiety and fears about multiple health issues (herself and husband) and is trying not to jump ahead and "worry but it's hard as things have continued."  To continue working with her anxious thought patterns, interrupting them and replacing with more realistic and encouraging thought patterns.  Her husband's Parkinson's disease continues to be challenging for both of them.  Her venting her anxiety and fears seemed very helpful to patient and not as fearful by session  end.    Interventions: Cognitive Behavioral Therapy and Solution-Oriented/Positive Psychology  Diagnosis:   ICD-10-CM   1. Generalized anxiety disorder  F41.1       Treatment  Plan:   Patient not signing tx plan updates on computer screen due to [place].  Treatment goals: Goals may remain on tx plan as patient works on strategies to meet her goals. Progress is noted every session in the "Progress" section of Plan. Long term goal: Develop healthy cognitive patterns and beliefs about self and the world that lead to alleviation of depression and anxiety, and help prevent relapse of depression and anxiety. Short term goal: Learn and implement personal skills for managing stress, solving daily problems, and resolving conflicts effectively.  Strategies: Use modeling and role-playing to help recognize anxious/depressive/negative thought patterns that create anxious/depressive/negative feelings and actions, interrupt them and replace with more positive reality-based thoughts that do not support depression.    Plan:  Patient today showing good motivation and engagement in session today.  She shared and worked with some of her more anxious and fearful thoughts, as noted above, which seemed to help her anxiety and fear level, even though overall she notices it "ebbs and flows" depending on what is going on in her life.  Does find that she is able to better use some strategies to help her in managing the anxiety.  New stressors seem to be more difficult when they arise.  Encouraged patient to continue using behaviors that she  has in the past and have been helpful to her including: Interrupting anxious/negative/depressive thoughts and replacing them with more realistic and empowering thoughts, getting outside daily and walking, staying in contact with people that are supportive of her and her husband, being involved in church activities they really enjoy, allowing her faith to be a part of her emotional  healing as well as spiritual, spending enjoyable time with her friends and dog, staying in the present focusing on what she can change, have healthy boundaries with others as needed including within her church, saying no when she needs to say no, practice believing in herself more, and recognize the strengths she is showing as she works with goal-directed behaviors in the midst of stressful circumstances as she tries to move forward in a more positive direction towards improved emotional and physical health.   Goal review and progress/challenges noted with patient.  Next appointment within 3 weeks.   Shanon Ace, LCSW

## 2020-10-25 DIAGNOSIS — M1712 Unilateral primary osteoarthritis, left knee: Secondary | ICD-10-CM | POA: Diagnosis not present

## 2020-10-25 DIAGNOSIS — M25552 Pain in left hip: Secondary | ICD-10-CM | POA: Diagnosis not present

## 2020-10-28 DIAGNOSIS — G8929 Other chronic pain: Secondary | ICD-10-CM | POA: Diagnosis not present

## 2020-10-28 DIAGNOSIS — M7918 Myalgia, other site: Secondary | ICD-10-CM | POA: Diagnosis not present

## 2020-10-28 DIAGNOSIS — M47816 Spondylosis without myelopathy or radiculopathy, lumbar region: Secondary | ICD-10-CM | POA: Diagnosis not present

## 2020-10-28 DIAGNOSIS — M47812 Spondylosis without myelopathy or radiculopathy, cervical region: Secondary | ICD-10-CM | POA: Diagnosis not present

## 2020-10-31 ENCOUNTER — Ambulatory Visit: Payer: Medicare Other | Admitting: Psychiatry

## 2020-11-07 DIAGNOSIS — Z20822 Contact with and (suspected) exposure to covid-19: Secondary | ICD-10-CM | POA: Diagnosis not present

## 2020-11-09 DIAGNOSIS — M25511 Pain in right shoulder: Secondary | ICD-10-CM | POA: Diagnosis not present

## 2020-11-09 DIAGNOSIS — R079 Chest pain, unspecified: Secondary | ICD-10-CM | POA: Diagnosis not present

## 2020-11-09 DIAGNOSIS — M25552 Pain in left hip: Secondary | ICD-10-CM | POA: Diagnosis not present

## 2020-11-09 DIAGNOSIS — M25561 Pain in right knee: Secondary | ICD-10-CM | POA: Diagnosis not present

## 2020-11-09 DIAGNOSIS — M25521 Pain in right elbow: Secondary | ICD-10-CM | POA: Diagnosis not present

## 2020-11-09 DIAGNOSIS — M25562 Pain in left knee: Secondary | ICD-10-CM | POA: Diagnosis not present

## 2020-11-11 ENCOUNTER — Ambulatory Visit (INDEPENDENT_AMBULATORY_CARE_PROVIDER_SITE_OTHER): Payer: Medicare Other | Admitting: Psychiatry

## 2020-11-11 ENCOUNTER — Other Ambulatory Visit: Payer: Self-pay

## 2020-11-11 DIAGNOSIS — F411 Generalized anxiety disorder: Secondary | ICD-10-CM

## 2020-11-11 DIAGNOSIS — M25552 Pain in left hip: Secondary | ICD-10-CM | POA: Diagnosis not present

## 2020-11-11 NOTE — Progress Notes (Signed)
[facility] Counselor/Therapist Progress Note  Patient ID: [name], MRN: [id number],    Date: 11/11/2020  Time Spent: 50 minutes   Treatment Type: Individual Therapy  Reported Symptoms: depression, some anxiety  Mental Status Exam:  Appearance:   Neat     Behavior:  Appropriate, Sharing, and Motivated  Motor:  Normal  Speech/Language:   Clear and Coherent  Affect:  Some anxiety and depression  Mood:  anxious and depressed  Thought process:  goal directed  Thought content:    WNL  Sensory/Perceptual disturbances:    WNL  Orientation:  oriented to person, place, time/date, situation, day of week, month of year, year, and stated date of Sept 12, 2022  Attention:  Good  Concentration:  Good and Fair  Memory:  Short term  memory, making  notes helps  Fund of knowledge:   Good  Insight:    Good and Fair  Judgment:   Good  Impulse Control:  Good   Risk Assessment: Danger to Self:  No Self-injurious Behavior: No Danger to Others: No Duty to Warn:no Physical Aggression / Violence:No  Access to Firearms a concern: No  Gang Involvement:No    Subjective: Patient in today reporting depression re: especially concerning health and family issues, and some anxiety related to health and family issues. Some overwhelmedness with responsibilities at church and looked at some ways so set healthy limits. Having some balance issues more recently and is in touch with her doctor on that. Has appt within the month to see him.  Processed a lot of her depressive thoughts/feelings in session which seemed to help her and she denies any SI.  Patient has had a lot of personal and family issues going on recently that seem to have contributed to some of her distress, especially the physical issues.  Also shared some concerns about the disability application that is in process for her husband related to his health issues.  Has had some success in working with her anxious thought patterns,  interrupting them and replacing with more realistic and encouraging thought patterns.  States that this takes intentional efforts and does not come naturally but notices a benefit.  Husband's Parkinson's disease continues to be a challenge and patient shares that it helps to be able to vent her fears and anxieties during session as this helps to ground her and restore some confidence and hope in moving forward.  Interventions: Cognitive Behavioral Therapy, Solution-Oriented/Positive Psychology, and Ego-Supportive  Diagnosis:   ICD-10-CM   1. Generalized anxiety disorder  F41.1       Treatment  Plan:   Patient not signing tx plan updates on computer screen due to [place].  Treatment goals: Goals may remain on tx plan as patient works on strategies to meet her goals. Progress is noted every session in the "Progress" section of Plan. Long term goal: Develop healthy cognitive patterns and beliefs about self and the world that lead to alleviation of depression and anxiety, and help prevent relapse of depression and anxiety. Short term goal: Learn and implement personal skills for managing stress, solving daily problems, and resolving conflicts effectively.  Strategies: Use modeling and role-playing to help recognize anxious/depressive/negative thought patterns that create anxious/depressive/negative feelings and actions, interrupt them and replace with more positive reality-based thoughts that do not support depression.   Plan: Patient today showing good motivation and participation in session today as she worked on some anxiety that had worsened some recently, and also some anxiety but the  depression seems stronger today.  Denies any SI, and it does seem to be related a lot to her and her husband's physical issues some of which they do not have answers for and are in the midst of doctor's appointments to try and determine next steps.  Processed her fears and anxieties and depressed feelings real well  in session today and able to eventually feel calmer and more grounded, trying not to jump ahead and stay in the present with her thoughts which also helps her manage fears and anxieties, per treatment goal plan above.  Encouraged patient to practice positive behaviors that have been helpful previously including: Getting outside daily, walk as she is able and being careful, staying in contact with people that are supportive of her, interrupting anxious/negative/depressive thoughts and challenging them and replacing with more realistic and empowering thoughts, being involved in church activities they really enjoy, allowing her faith to be a part of her emotional healing as well as spiritual, staying in the present focusing on what she can change, spending enjoyable time with her friends and dog, having healthy boundaries with others as needed including within her church, saying no when she needs to say no, practice believing in herself more, consistent positive self talk, and feeling good about the strength she is showing as she works with goal-directed behaviors trying to move forward in a more positive direction towards improved emotional health.  Goal review and progress/challenges noted with patient.  Next appointment within 3 weeks.   Shanon Ace, LCSW

## 2020-11-12 DIAGNOSIS — Z23 Encounter for immunization: Secondary | ICD-10-CM | POA: Diagnosis not present

## 2020-11-14 ENCOUNTER — Other Ambulatory Visit: Payer: Self-pay | Admitting: Cardiovascular Disease

## 2020-11-14 DIAGNOSIS — R350 Frequency of micturition: Secondary | ICD-10-CM | POA: Diagnosis not present

## 2020-11-14 DIAGNOSIS — N3946 Mixed incontinence: Secondary | ICD-10-CM | POA: Diagnosis not present

## 2020-11-18 ENCOUNTER — Other Ambulatory Visit: Payer: Self-pay | Admitting: Cardiovascular Disease

## 2020-11-21 DIAGNOSIS — M79641 Pain in right hand: Secondary | ICD-10-CM | POA: Diagnosis not present

## 2020-11-21 DIAGNOSIS — S5001XA Contusion of right elbow, initial encounter: Secondary | ICD-10-CM | POA: Diagnosis not present

## 2020-11-25 ENCOUNTER — Other Ambulatory Visit: Payer: Self-pay

## 2020-11-25 DIAGNOSIS — Z01419 Encounter for gynecological examination (general) (routine) without abnormal findings: Secondary | ICD-10-CM

## 2020-11-25 DIAGNOSIS — N952 Postmenopausal atrophic vaginitis: Secondary | ICD-10-CM

## 2020-11-25 DIAGNOSIS — G43019 Migraine without aura, intractable, without status migrainosus: Secondary | ICD-10-CM | POA: Diagnosis not present

## 2020-11-25 NOTE — Telephone Encounter (Signed)
03/21/20 office note states patient is not using the Estradiol Tabs. At that visit compounded estradiol cream was prescribed for the year.

## 2020-12-03 ENCOUNTER — Ambulatory Visit (INDEPENDENT_AMBULATORY_CARE_PROVIDER_SITE_OTHER): Payer: Medicare Other | Admitting: Psychiatry

## 2020-12-03 ENCOUNTER — Other Ambulatory Visit: Payer: Self-pay

## 2020-12-03 DIAGNOSIS — F411 Generalized anxiety disorder: Secondary | ICD-10-CM

## 2020-12-03 NOTE — Progress Notes (Signed)
[facility] Counselor/Therapist Progress Note  Patient ID: [name], MRN: [id number],    Date: 12/03/2020  Time Spent: 56 minutes   Treatment Type: Individual Therapy  Reported Symptoms: anxiety, some depression, "worries" re: health issues and "world issues that add to my anxiety"  Mental Status Exam:  Appearance:   Casual     Behavior:  Appropriate, Sharing, and Motivated  Motor:  Normal  Speech/Language:   Clear and Coherent  Affect:  Depressed and anxious  Mood:  anxious and some depression  Thought process:  goal directed  Thought content:    Some overthinking  Sensory/Perceptual disturbances:    WNL  Orientation:  oriented to person, place, time/date, situation, day of week, month of year, year, and stated date of Oct. 4, 2022  Attention:  Good  Concentration:  Good and Fair  Memory:  Some short term memory issues  Fund of knowledge:   Good  Insight:    Good  Judgment:   Good  Impulse Control:  Good   Risk Assessment: Danger to Self:  No Self-injurious Behavior: No Danger to Others: No Duty to Warn:no Physical Aggression / Violence:No  Access to Firearms a concern: No  Gang Involvement:No   Subjective:  Patient in today reporting anxiety as her main symptom, and some depression. Anxiety and depression mostly related to finances, family, and health concerns. Worked with tx goal strategies today in working on her increase in anxious thoughts and being able to interrupt them and challenge/replacing them with positive/reality-based thoughts that do not feed her anxiety. Also related how her anxiety affects her around the house and "I get in a hurry and have fallen a couple times at home". Discussed in detail her need to be more careful and some strategies of prevention as she moves about inside and outside of her home. Also has been in touch with her doctor re: balance issues. Helping her husband cope with his Parkinson's disease continues to be a challenge for  patient but feels they are at currently a place of some stability with little changes noted most recently.  Working hard to stay more in the present and not jump ahead to future anxieties and worries.  Interventions: Solution-Oriented/Positive Psychology, Ego-Supportive, and Insight-Oriented  Diagnosis:   ICD-10-CM   1. Generalized anxiety disorder  F41.1       Treatment  Plan:   Patient not signing tx plan updates on computer screen due to [place].  Treatment goals: Goals may remain on tx plan as patient works on strategies to meet her goals. Progress is noted every session in the "Progress" section of Plan. Long term goal: Develop healthy cognitive patterns and beliefs about self and the world that lead to alleviation of depression and anxiety, and help prevent relapse of depression and anxiety. Short term goal: Learn and implement personal skills for managing stress, solving daily problems, and resolving conflicts effectively.  Strategies: Use modeling and role-playing to help recognize anxious/depressive/negative thought patterns that create anxious/depressive/negative feelings and actions, interrupt them and replace with more positive reality-based thoughts that do not support depression.    Plan: Patient today showing good motivation and engagement in session.  Worked well with her anxiety issues especially as they relate to family, finances, and health concerns.  Worked with a each of these areas in session today and also, per her treatment goal plan, focused on decreasing her anxious thoughts through interruption and challenging and replacing them with more realistic and empowering thoughts.  Notices that she has to really focus on this in order to sustain it for any length of time.  Encouraged patient in practicing positive behaviors including: Getting outside daily and walking as she is able, taking more care as she moves about to prevent falls, staying in contact with people that are  supportive of her, interrupting anxious/negative/depressive thoughts and challenging them and replacing with more realistic and empowering thoughts, being involved in church activities they really enjoy, staying in the present and focusing on what she can change, allowing her faith to be a part of her emotional healing as well as spiritual, spending enjoyable time with her friends and dog, having healthy boundaries with others as needed including within her church, saying no when she needs to say no, practice believing in herself more, consistent positive self talk, and feeling good about the strength that she shows as she works with goal-directed behaviors to move in a direction that supports improved emotional health.  Goal review and progress/challenges noted with patient.  Next appointment within 3 weeks.  This record has been created using [employer].  Chart creation errors have been sought, but may not always have been located and corrected.  Such creation errors do not reflect on the standard of medical care provided.    Shanon Ace, LCSW

## 2020-12-08 DIAGNOSIS — Z20822 Contact with and (suspected) exposure to covid-19: Secondary | ICD-10-CM | POA: Diagnosis not present

## 2020-12-12 DIAGNOSIS — E785 Hyperlipidemia, unspecified: Secondary | ICD-10-CM | POA: Diagnosis not present

## 2020-12-12 DIAGNOSIS — E039 Hypothyroidism, unspecified: Secondary | ICD-10-CM | POA: Diagnosis not present

## 2020-12-12 DIAGNOSIS — M810 Age-related osteoporosis without current pathological fracture: Secondary | ICD-10-CM | POA: Diagnosis not present

## 2020-12-13 DIAGNOSIS — M47816 Spondylosis without myelopathy or radiculopathy, lumbar region: Secondary | ICD-10-CM | POA: Diagnosis not present

## 2020-12-20 DIAGNOSIS — F0390 Unspecified dementia without behavioral disturbance: Secondary | ICD-10-CM | POA: Diagnosis not present

## 2020-12-20 DIAGNOSIS — M810 Age-related osteoporosis without current pathological fracture: Secondary | ICD-10-CM | POA: Diagnosis not present

## 2020-12-20 DIAGNOSIS — G4733 Obstructive sleep apnea (adult) (pediatric): Secondary | ICD-10-CM | POA: Diagnosis not present

## 2020-12-20 DIAGNOSIS — N3281 Overactive bladder: Secondary | ICD-10-CM | POA: Diagnosis not present

## 2020-12-20 DIAGNOSIS — M5380 Other specified dorsopathies, site unspecified: Secondary | ICD-10-CM | POA: Diagnosis not present

## 2020-12-20 DIAGNOSIS — Z0000 Encounter for general adult medical examination without abnormal findings: Secondary | ICD-10-CM | POA: Diagnosis not present

## 2020-12-20 DIAGNOSIS — E785 Hyperlipidemia, unspecified: Secondary | ICD-10-CM | POA: Diagnosis not present

## 2020-12-20 DIAGNOSIS — E039 Hypothyroidism, unspecified: Secondary | ICD-10-CM | POA: Diagnosis not present

## 2020-12-20 DIAGNOSIS — F329 Major depressive disorder, single episode, unspecified: Secondary | ICD-10-CM | POA: Diagnosis not present

## 2020-12-20 DIAGNOSIS — G43009 Migraine without aura, not intractable, without status migrainosus: Secondary | ICD-10-CM | POA: Diagnosis not present

## 2020-12-20 DIAGNOSIS — M1990 Unspecified osteoarthritis, unspecified site: Secondary | ICD-10-CM | POA: Diagnosis not present

## 2020-12-23 ENCOUNTER — Other Ambulatory Visit: Payer: Self-pay | Admitting: *Deleted

## 2020-12-23 ENCOUNTER — Other Ambulatory Visit: Payer: Self-pay

## 2020-12-23 ENCOUNTER — Ambulatory Visit (INDEPENDENT_AMBULATORY_CARE_PROVIDER_SITE_OTHER): Payer: Medicare Other | Admitting: Psychiatry

## 2020-12-23 DIAGNOSIS — F330 Major depressive disorder, recurrent, mild: Secondary | ICD-10-CM

## 2020-12-23 DIAGNOSIS — Z01419 Encounter for gynecological examination (general) (routine) without abnormal findings: Secondary | ICD-10-CM

## 2020-12-23 DIAGNOSIS — N952 Postmenopausal atrophic vaginitis: Secondary | ICD-10-CM

## 2020-12-23 MED ORDER — ESTRADIOL 10 MCG VA TABS: ORAL_TABLET | VAGINAL | 0 refills | Status: DC

## 2020-12-23 NOTE — Progress Notes (Signed)
[facility] Counselor/Therapist Progress Note  Patient ID: [name], MRN: [id number],    Date: 12/23/2020  Time Spent: 55 minutes   Treatment Type: Individual Therapy  Reported Symptoms: anxiety, depression "more stronger recently"  Mental Status Exam:  Appearance:   Casual     Behavior:  Appropriate, Sharing, and Motivated  Motor:  Normal  Speech/Language:   Clear and Coherent  Affect:  Depressed and anxious  Mood:  anxious and depressed  Thought process:  goal directed  Thought content:    Some obsessiveness  Sensory/Perceptual disturbances:    WNL  Orientation:  oriented to person, place, time/date, situation, day of week, month of year, year, and stated date of Oct. 24, 2022  Attention:  Good  Concentration:  Good and Fair  Memory:  Some short term memory issues and Dr is aware  Fund of knowledge:   Good  Insight:    Good  Judgment:   Good  Impulse Control:  Good   Risk Assessment: Danger to Self:  No Self-injurious Behavior: No Danger to Others: No Duty to Warn:no Physical Aggression / Violence:No  Access to Firearms a concern: No  Gang Involvement:No   Subjective:    Patient in today reporting anxiety, depression with the depression being the stronger symptom more recently, mostly related to her health, finances, and family concerns. Has had several physical issues recently and had diagnostic injection that was helpful recently. Is being scheduled for further medical procedures regarding this at the end of this week. Issues with a neighbor continue to arise, including this past week. Patient was able to respond but maintain better boundaries with neighbor.  Continues to work on family issues especially with oldest daughter and setting healthier boundaries, per goal directed behavior. Doing some better managing anxious thoughts. 1 fall since last appt where I leaned over too far but didn't get hurt. To continue trying to stay in the present and focusing on  what she can change versus stressing over what she cannot change or control.   Interventions: Ego-Supportive and Insight-Oriented  Diagnosis:   ICD-10-CM   1. Major depressive disorder, recurrent episode, mild ([place])  F33.0      Treatment  Plan:   Patient not signing tx plan updates on computer screen due to [place].  Treatment goals: Goals may remain on tx plan as patient works on strategies to meet her goals. Progress is noted every session in the "Progress" section of Plan. Long term goal: Develop healthy cognitive patterns and beliefs about self and the world that lead to alleviation of depression and anxiety, and help prevent relapse of depression and anxiety. Short term goal: Learn and implement personal skills for managing stress, solving daily problems, and resolving conflicts effectively.  Strategies: Use modeling and role-playing to help recognize anxious/depressive/negative thought patterns that create anxious/depressive/negative feelings and actions, interrupt them and replace with more positive reality-based thoughts that do not support depression.     Plan:    Patient today showing really good motivation and actively participated in session.  Today our work was more focused on her depression which was stronger more recently due to some personal, health, and family situations.  Patient worked well with these and seemed to feel more energy and less depressed, more grounded by end of session.  Also left with a couple newer strategies to use in regards to some communication issues within the family and also a neighbor.  Encouraged patient and practicing positive behaviors including: Taking more  care as she moves about to prevent falls, getting outside daily and walking as she is able, enjoying her pet dog which is therapeutic for her, staying in contact with people that are supportive of her, interrupting anxious/negative/depressive thoughts and challenging them and replacing with more  realistic and empowering thoughts, being involved in church activities that they really enjoy, staying in the present and focusing on what she can change or control, taking breaks throughout the day as needed, allowing her faith to be a part of her emotional healing as well as spiritual, spending enjoyable time with friends and fellow church members, having healthy boundaries with others as needed including within the family and some friends, saying no when she needs to say no, believe in herself more, consistent positive self talk, and feeling good about the strength she shows as she works with goal-directed behaviors to move in a direction that supports improved emotional health and overall wellbeing.  Goal review and progress/challenges noted with patient.  Next appointment within 3 weeks.  This record has been created using [employer].  Chart creation errors have been sought, but may not always have been located and corrected.  Such creation errors do not reflect on the standard of medical care provided.    Shanon Ace, LCSW

## 2020-12-23 NOTE — Telephone Encounter (Signed)
Last annual exam was on 6/20 Annual exam scheduled on 03/2021 Last mammogram on 10/2019

## 2020-12-27 DIAGNOSIS — M47816 Spondylosis without myelopathy or radiculopathy, lumbar region: Secondary | ICD-10-CM | POA: Diagnosis not present

## 2021-01-10 DIAGNOSIS — M47816 Spondylosis without myelopathy or radiculopathy, lumbar region: Secondary | ICD-10-CM | POA: Diagnosis not present

## 2021-01-14 ENCOUNTER — Other Ambulatory Visit: Payer: Self-pay

## 2021-01-14 ENCOUNTER — Ambulatory Visit (INDEPENDENT_AMBULATORY_CARE_PROVIDER_SITE_OTHER): Payer: Medicare Other | Admitting: Psychiatry

## 2021-01-14 DIAGNOSIS — F411 Generalized anxiety disorder: Secondary | ICD-10-CM

## 2021-01-14 NOTE — Progress Notes (Signed)
[facility] Counselor/Therapist Progress Note  Patient ID: [name], MRN: [id number],    Date: 01/14/2021  Time Spent: 57 minutes   Treatment Type: Individual Therapy  Reported Symptoms: anxious and depressed regarding family, health, and personal concerns  Mental Status Exam:  Appearance:   Casual     Behavior:  Appropriate, Sharing, and Motivated  Motor:  Normal  Speech/Language:   Clear and Coherent  Affect:  Depressed and anxious  Mood:  anxious and depressed  Thought process:  goal directed  Thought content:    Some overthinking  Sensory/Perceptual disturbances:    WNL  Orientation:  oriented to person, place, time/date, situation, day of week, month of year, year, and stated date of Nov. 15, 2022  Attention:  Good  Concentration:  Good  Memory:  Some short term memory concerns. Dr is aware.  Fund of knowledge:   Good  Insight:    Good and Fair  Judgment:   Good  Impulse Control:  Good   Risk Assessment: Danger to Self:  No Self-injurious Behavior: No Danger to Others: No Duty to Warn:no Physical Aggression / Violence:No  Access to Firearms a concern: No  Gang Involvement:No   Subjective: Patient in today reporting anxiety and depression related to personal, family, and health concerns. Needed session to process her personal health concerns,  and some recent family situations that have been hurtful to patient; able to talk through the family differences/concerns including her hurt within family that had led to more anxiety and depression for patient. Realizing her need to "let go" of certain hurts rather than hanging onto them, and is working on the letting go. Prior financial concerns a little more leveled out. Physically is bouncing back from her recent oblation.Making some progress, per patient report, in remaining in the present and focusing on what she can change versus cannot change.  Interventions: Solution-Oriented/Positive Psychology,  Ego-Supportive, and Insight-Oriented  Diagnosis:   ICD-10-CM   1. Generalized anxiety disorder  F41.1      Treatment  Plan:   Patient not signing tx plan updates on computer screen due to [place].  Treatment goals: Goals may remain on tx plan as patient works on strategies to meet her goals. Progress is noted every session in the "Progress" section of Plan. Long term goal: Develop healthy cognitive patterns and beliefs about self and the world that lead to alleviation of depression and anxiety, and help prevent relapse of depression and anxiety. Short term goal: Learn and implement personal skills for managing stress, solving daily problems, and resolving conflicts effectively.  Strategies: Use modeling and role-playing to help recognize anxious/depressive/negative thought patterns that create anxious/depressive/negative feelings and actions, interrupt them and replace with more positive reality-based thoughts that do not support depression.     Plan:  Patient today showing good motivation and engagement in session as she worked on depression and anxiety related mostly to personal, family, and health concerns.  Did seem to feel less depressed and understood by session end.  Continuing to work with newer strategies regarding communication that we discussed last session especially with some individuals in the family that can be challenging in their communication and behavior.  Encouraged patient and her practice of positive behaviors that have helped her previously including: Believing in herself more, taking more care as she moves about in order to prevent instability in any additional falls, getting outside daily and walking as she is able, enjoying her pet dog which is very therapeutic for  her, staying in contact with people that are supportive, interrupting anxious/negative/depressive thoughts and challenging them and replacing with more realistic and empowering thoughts, staying involved in  church activities that she enjoys, remaining in the present and focusing on what she can change, taking breaks throughout the day, allowing her faith to be a part of her emotional healing as well as spiritual, spending enjoyable time with friends and fellow church members, having healthy boundaries with others as needed, saying no when she needs to say no, consistent positive self talk, and realize the strength she shows as she works with goal-directed behaviors to move in a direction that supports overall wellbeing and improved emotional health.  Goal review and progress/challenges noted with patient.  Next appointment within 2 to 3 weeks.  This record has been created using [employer].  Chart creation errors have been sought, but may not always have been located and corrected.  Such creation errors do not reflect on the standard of medical care provided.    Shanon Ace, LCSW

## 2021-01-22 DIAGNOSIS — M47816 Spondylosis without myelopathy or radiculopathy, lumbar region: Secondary | ICD-10-CM | POA: Diagnosis not present

## 2021-01-28 ENCOUNTER — Ambulatory Visit (INDEPENDENT_AMBULATORY_CARE_PROVIDER_SITE_OTHER): Payer: Medicare Other | Admitting: Psychiatry

## 2021-01-28 ENCOUNTER — Other Ambulatory Visit: Payer: Self-pay

## 2021-01-28 DIAGNOSIS — F411 Generalized anxiety disorder: Secondary | ICD-10-CM | POA: Diagnosis not present

## 2021-01-28 NOTE — Progress Notes (Signed)
[facility] Counselor/Therapist Progress Note  Patient ID: [name], MRN: [id number],    Date: 01/28/2021  Time Spent: 57 minutes   Treatment Type: Individual Therapy  Reported Symptoms: anxiety, depression improved  Mental Status Exam:  Appearance:   Casual     Behavior:  Appropriate, Sharing, and Motivated  Motor:  Normal  Speech/Language:   Clear and Coherent  Affect:  anxious  Mood:  anxious  Thought process:  goal directed  Thought content:    WNL  Sensory/Perceptual disturbances:    WNL  Orientation:  oriented to person, place, time/date, situation, day of week, month of year, year, and stated date of Nov. 29, 2022  Attention:  Good  Concentration:  Good  Memory:  WNL  Fund of knowledge:   Good  Insight:    Good and Fair  Judgment:   Good  Impulse Control:  Good   Risk Assessment: Danger to Self:  No Self-injurious Behavior: No Danger to Others: No Duty to Warn:no Physical Aggression / Violence:No  Access to Firearms a concern: No  Gang Involvement:No   Subjective: Patient in today reporting anxiety as main symptom, with her depression having decreased. Anxiety related mostly to personal, family, health concerns, and financial stressors, all of which patient processed some today. Also discussed some of her family stressors and looked at different ways of communicating to listen more effectively and in paying attention to " how I say what I say".  Continues to try and work on letting go of hurts rather than hanging onto them in order to move forward.  Is making progress with her depression significantly decreasing and continues to be motivated towards goal-directed behaviors especially in working on her anxiety and staying presently focused on what she can change versus cannot change.  Interventions: Solution-Oriented/Positive Psychology, Ego-Supportive, and Insight-Oriented  Treatment  Plan:   Patient not signing tx plan updates on computer screen due to  [place].  Treatment goals: Goals may remain on tx plan as patient works on strategies to meet her goals. Progress is noted every session in the "Progress" section of Plan. Long term goal: Develop healthy cognitive patterns and beliefs about self and the world that lead to alleviation of depression and anxiety, and help prevent relapse of depression and anxiety. Short term goal: Learn and implement personal skills for managing stress, solving daily problems, and resolving conflicts effectively.  Strategies: Use modeling and role-playing to help recognize anxious/depressive/negative thought patterns that create anxious/depressive/negative feelings and actions, interrupt them and replace with more positive reality-based thoughts that do not support depression.    Diagnosis:No diagnosis found.  Plan: Patient today showing good motivation and participation in session today as she worked on her anxiety related to personal, family, health issues, and finances. Is making progress. Depression quite decreased. No tearfulness. Still working with communication strategies discussed previously, especially active listening to really hear the other person and paying attention to "how I say what I say". Encouraged patient in her practice of positive behaviors including: Believing in herself more, getting outside daily as possible, being careful as she moves about due to prior falls, enjoying her pet dog which is very therapeutic for her, staying in contact with people that are supportive, interrupting anxious/negative/depressive thoughts and challenging them and replacing with more realistic and empowering thoughts, remaining in the present and focusing on what she can change, staying involved in church activities that she enjoys, taking breaks throughout the day, allowing her faith to be a  part of her emotional healing as well as spiritual, spending enjoyable time with friends and fellow church members, having healthy  boundaries with others as needed, saying no when she needs to say no, consistent positive self talk, and realize the strength she shows working with goal-directed behaviors to move in a direction that supports improved emotional health.   Goal review and progress/challenges noted with patient.  Next appt within 3 wks.  This record has been created using [employer].  Chart creation errors have been sought, but may not always have been located and corrected.  Such creation errors do not reflect on the standard of medical care provided.    Shanon Ace, LCSW

## 2021-01-30 ENCOUNTER — Other Ambulatory Visit: Payer: Self-pay | Admitting: Cardiovascular Disease

## 2021-02-12 ENCOUNTER — Ambulatory Visit (INDEPENDENT_AMBULATORY_CARE_PROVIDER_SITE_OTHER): Payer: Medicare Other | Admitting: Psychiatry

## 2021-02-12 ENCOUNTER — Other Ambulatory Visit: Payer: Self-pay

## 2021-02-12 DIAGNOSIS — F411 Generalized anxiety disorder: Secondary | ICD-10-CM | POA: Diagnosis not present

## 2021-02-12 NOTE — Progress Notes (Signed)
[facility] Counselor/Therapist Progress Note  Patient ID: BENNIE SCAFF, MRN: [id number],    Date: 02/12/2021  Time Spent: 55 minutes   Treatment Type: Individual Therapy  Reported Symptoms: anxiety, depression (decreasing)  Mental Status Exam:  Appearance:   Neat     Behavior:  Appropriate, Sharing, and Motivated  Motor:  Normal  Speech/Language:   Clear and Coherent  Affect:  Depressed and anxious  Mood:  anxious and depressed  Thought process:  goal directed  Thought content:    WNL  Sensory/Perceptual disturbances:    WNL  Orientation:  oriented to person, place, time/date, situation, day of week, month of year, year, and stated date of Dec. 14, 2022  Attention:  Good  Concentration:  Good  Memory:  Some short term memory issues  Fund of knowledge:   Good  Insight:    Good  Judgment:   Good  Impulse Control:  Good and Fair   Risk Assessment: Danger to Self:  No Self-injurious Behavior: No Danger to Others: No Duty to Warn:no Physical Aggression / Violence:No  Access to Firearms a concern: No  Gang Involvement:No   Subjective:  Patient in today reporting anxiety and depression (decreasing). Concerned re: husband's Parkinson's, and younger daughter's gastro issues. Financial stressors are " a little better" and still trying to be careful. Feels her anxiety continues to be her main symptom, as her depression has decreased some.  Anxiety re: getting together with family, family health concerns including her own health issues which she processed today in more detail. Also discussed some challenges with she and her husband that they are working on together along with a physician. Tries not to assume what may go wrong versus right but this can be a challenge.Realizes communication can be an issue for both of them and working together on that as well, including better listening skills and paying attention to "how they say what they say." Encouraged to continue working on  letting go of old hurts or beliefs that can hold her back from moving forward.  Interventions: Solution-Oriented/Positive Psychology, Ego-Supportive, and Insight-Oriented   Treatment  Plan:   Patient not signing tx plan updates on computer screen due to [place].  Treatment goals: Goals may remain on tx plan as patient works on strategies to meet her goals. Progress is noted every session in the "Progress" section of Plan. Long term goal: Develop healthy cognitive patterns and beliefs about self and the world that lead to alleviation of depression and anxiety, and help prevent relapse of depression and anxiety. Short term goal: Learn and implement personal skills for managing stress, solving daily problems, and resolving conflicts effectively.  Strategies: Use modeling and role-playing to help recognize anxious/depressive/negative thought patterns that create anxious/depressive/negative feelings and actions, interrupt them and replace with more positive reality-based thoughts that do not support depression  Diagnosis:   ICD-10-CM   1. Generalized anxiety disorder  F41.1      Plan: Patient today showing good motivation and active engagement in session as she worked on anxiety, some depression, and her concerns about multiple family issues primarily health related including her own health care issues.  Depression has decreased and feels good about that.  No tearfulness today and seems to be gaining and insight and awareness including reasoning through some situations better and having more interpersonal strength.  Discussed some communication issues within the family and marriage as they work to better communicate with each other especially and being more aware of how  they say certain things and also to have more effective listening skills as discussed today.  Encouraged patient and her practice of positive behaviors including: Believing in herself more and that she can make further changes that she  is hoping to make, getting outside daily as possible, walking her dog, being careful as she moves about due to having some prior falls, enjoying her pet dog which is very therapeutic for her, staying in contact with people that are supportive, interrupting anxious/negative/depressive thoughts and challenging them to replace with more realistic and empowering thoughts, remaining in the present and focusing on what she can change, staying involved in church activities that she enjoys, taking breaks throughout the day, allowing her faith to be a part of her emotional healing as well as spiritual, spending enjoyable time with friends and fellow church members, having healthy boundaries with others as needed, saying no when she needs to say no, consistent positive self talk, and feel good about the strength she shows working with goal-directed behaviors to move in a direction that supports improved emotional health and overall wellbeing.  Goal review and progress/challenges noted with patient.  Next appointment within 3 weeks.  This record has been created using [employer].  Chart creation errors have been sought, but may not always have been located and corrected.  Such creation errors do not reflect on the standard of medical care provided.   Shanon Ace, LCSW

## 2021-02-25 DIAGNOSIS — M5416 Radiculopathy, lumbar region: Secondary | ICD-10-CM | POA: Diagnosis not present

## 2021-02-25 DIAGNOSIS — G8929 Other chronic pain: Secondary | ICD-10-CM | POA: Diagnosis not present

## 2021-02-25 DIAGNOSIS — M7918 Myalgia, other site: Secondary | ICD-10-CM | POA: Diagnosis not present

## 2021-02-25 DIAGNOSIS — M47812 Spondylosis without myelopathy or radiculopathy, cervical region: Secondary | ICD-10-CM | POA: Diagnosis not present

## 2021-02-25 DIAGNOSIS — M47816 Spondylosis without myelopathy or radiculopathy, lumbar region: Secondary | ICD-10-CM | POA: Diagnosis not present

## 2021-02-25 DIAGNOSIS — M5136 Other intervertebral disc degeneration, lumbar region: Secondary | ICD-10-CM | POA: Diagnosis not present

## 2021-02-26 ENCOUNTER — Ambulatory Visit: Payer: Medicare Other | Admitting: Psychiatry

## 2021-02-28 DIAGNOSIS — M5116 Intervertebral disc disorders with radiculopathy, lumbar region: Secondary | ICD-10-CM | POA: Diagnosis not present

## 2021-02-28 DIAGNOSIS — M48061 Spinal stenosis, lumbar region without neurogenic claudication: Secondary | ICD-10-CM | POA: Diagnosis not present

## 2021-02-28 DIAGNOSIS — M5126 Other intervertebral disc displacement, lumbar region: Secondary | ICD-10-CM | POA: Diagnosis not present

## 2021-02-28 DIAGNOSIS — M5136 Other intervertebral disc degeneration, lumbar region: Secondary | ICD-10-CM | POA: Diagnosis not present

## 2021-02-28 DIAGNOSIS — M5416 Radiculopathy, lumbar region: Secondary | ICD-10-CM | POA: Diagnosis not present

## 2021-03-02 ENCOUNTER — Other Ambulatory Visit: Payer: Self-pay | Admitting: Psychiatry

## 2021-03-02 DIAGNOSIS — F330 Major depressive disorder, recurrent, mild: Secondary | ICD-10-CM

## 2021-03-03 ENCOUNTER — Other Ambulatory Visit: Payer: Self-pay | Admitting: Cardiovascular Disease

## 2021-03-07 DIAGNOSIS — H903 Sensorineural hearing loss, bilateral: Secondary | ICD-10-CM | POA: Diagnosis not present

## 2021-03-07 DIAGNOSIS — M5136 Other intervertebral disc degeneration, lumbar region: Secondary | ICD-10-CM | POA: Diagnosis not present

## 2021-03-07 DIAGNOSIS — M47816 Spondylosis without myelopathy or radiculopathy, lumbar region: Secondary | ICD-10-CM | POA: Diagnosis not present

## 2021-03-07 DIAGNOSIS — M7918 Myalgia, other site: Secondary | ICD-10-CM | POA: Diagnosis not present

## 2021-03-07 DIAGNOSIS — T161XXA Foreign body in right ear, initial encounter: Secondary | ICD-10-CM | POA: Diagnosis not present

## 2021-03-07 DIAGNOSIS — M7061 Trochanteric bursitis, right hip: Secondary | ICD-10-CM | POA: Diagnosis not present

## 2021-03-07 DIAGNOSIS — G8929 Other chronic pain: Secondary | ICD-10-CM | POA: Diagnosis not present

## 2021-03-07 DIAGNOSIS — M5416 Radiculopathy, lumbar region: Secondary | ICD-10-CM | POA: Diagnosis not present

## 2021-03-10 ENCOUNTER — Other Ambulatory Visit: Payer: Self-pay | Admitting: Psychiatry

## 2021-03-10 DIAGNOSIS — F411 Generalized anxiety disorder: Secondary | ICD-10-CM

## 2021-03-12 DIAGNOSIS — M5416 Radiculopathy, lumbar region: Secondary | ICD-10-CM | POA: Diagnosis not present

## 2021-03-17 ENCOUNTER — Other Ambulatory Visit: Payer: Self-pay

## 2021-03-17 ENCOUNTER — Ambulatory Visit (INDEPENDENT_AMBULATORY_CARE_PROVIDER_SITE_OTHER): Payer: Medicare Other | Admitting: Psychiatry

## 2021-03-17 DIAGNOSIS — N952 Postmenopausal atrophic vaginitis: Secondary | ICD-10-CM

## 2021-03-17 DIAGNOSIS — Z01419 Encounter for gynecological examination (general) (routine) without abnormal findings: Secondary | ICD-10-CM

## 2021-03-17 DIAGNOSIS — F411 Generalized anxiety disorder: Secondary | ICD-10-CM

## 2021-03-17 MED ORDER — ESTRADIOL 10 MCG VA TABS: ORAL_TABLET | VAGINAL | 0 refills | Status: DC

## 2021-03-17 NOTE — Telephone Encounter (Signed)
Last AEX 03/21/20. Scheduled AEX 03/26/2021.

## 2021-03-17 NOTE — Telephone Encounter (Signed)
Please have patient update her mammogram if not done in the last year.

## 2021-03-17 NOTE — Telephone Encounter (Signed)
I spoke with patient and she said that she did have mammogram performed last year. I called [facility] and confirmed and they are faxing report to Dr. Quincy Simmonds.

## 2021-03-17 NOTE — Progress Notes (Signed)
[facility] Counselor/Therapist Progress Note  Patient ID: [name], MRN: [id number],    Date: 03/17/2021  Time Spent: 50 minutes   Treatment Type: Individual Therapy  Reported Symptoms: depression, anxiety "some gradual improvement in both"  Mental Status Exam:  Appearance:   Casual     Behavior:  Appropriate, Sharing, and Motivated  Motor:  Normal  Speech/Language:   Clear and Coherent  Affect:  Depressed and anxious  Mood:  anxious and depressed  Thought process:  goal directed  Thought content:    WNL  Sensory/Perceptual disturbances:    WNL  Orientation:  oriented to person, place, time/date, situation, day of week, month of year, year, and stated date of Jan. 16, 2023  Attention:  Good  Concentration:  Good  Memory:  Occasionally will forget a word  Fund of knowledge:   Good  Insight:    Good and Fair  Judgment:   Good  Impulse Control:  Good   Risk Assessment: Danger to Self:  No Self-injurious Behavior: No Danger to Others: No Duty to Warn:no Physical Aggression / Violence:No  Access to Firearms a concern: No  Gang Involvement:No   Subjective: Patient in today reporting anxiety and depression but both have improved some. Progressing with her goals, stays motivated and fairly active, some lingering depression and anxiety. "What complicates things is my issues and my husband [name]'s issues and both of [place] trying to get better." Holidays with family went ok, with some interpersonal concerns between adult daughters. Husband's parkinson's disease "is about the same" and he struggles with anxiety and depression. Patient looking very neat today, with new haircut which she states has helped her mood also. Patient processing family issues that contribute to her depressive/anxious mood, and realizing some "letting go" that patient needs to work on more. Trying to get involved in Pilates, pottery, Zoom class through her church. Working hard to not look for what might go  wrong versus right, which is challenging for patient. Communication still a challenge and she and husband continues to work on that part of their relationship. Tries not to read "bad news or watch it on TV" as it does affect her mood negatively.  Trying to focus more on positives.  Is doing some good work and trying to let go more and more of old patterns of behavior, hurts, and beliefs that hold her back   Interventions: Solution-Oriented/Positive Psychology, Ego-Supportive, and Insight-Oriented  Treatment  Plan:   Patient not signing tx plan updates on computer screen due to [place].  Treatment goals: Goals may remain on tx plan as patient works on strategies to meet her goals. Progress is noted every session in the "Progress" section of Plan. Long term goal: Develop healthy cognitive patterns and beliefs about self and the world that lead to alleviation of depression and anxiety, and help prevent relapse of depression and anxiety. Short term goal: Learn and implement personal skills for managing stress, solving daily problems, and resolving conflicts effectively.  Strategies: Use modeling and role-playing to help recognize anxious/depressive/negative thought patterns that create anxious/depressive/negative feelings and actions, interrupt them and replace with more positive reality-based thoughts that do not support depression.  Diagnosis:   ICD-10-CM   1. Generalized anxiety disorder  F41.1      Plan: Patient today showing good motivation and participated well in session, working on anxiety and depression as well as working with hurts and beliefs and old patterns of behavior that tend to get in the way  of her moving forward in more positive directions.  Seems to be better and not having quite as much physical pain recently with her back.  Family issues and concerns between her 2 adult daughters still create some depression and anxiety for patient, however patient is showing more strength in  managing this.  Anxiety and depression have decreased some.  Encouraged patient in her practice of positive behaviors including: For every negative thought create 2 positives, reflect on her progress more often, believing in herself more that she can make further changes that she is hoping to make, getting outside daily as possible, walking her dog, being careful as she moves about due to having some prior falls, enjoying her dog which is very therapeutic for her, staying in contact with people that are supportive, interrupting anxious/negative/depressive thoughts and challenging them to replace with more realistic/empowering thoughts, remaining in the present and focusing on what she can change, staying involved in church activities that she and her husband enjoys, taking breaks throughout the day, allowing her faith to be a part of her emotional healing as well as spiritual, spending enjoyable time with friends and fellow church members, healthy boundaries with others as needed, saying no when she needs to say no without guilt, consistent positive self talk, and recognize the strength she shows working with goal-directed behaviors to move in a direction that supports improved emotional health.  Goal review and progress/challenges noted with patient.  Next appointment within 3 weeks.  This record has been created using [employer].  Chart creation errors have been sought, but may not always have been located and corrected.  Such creation errors do not reflect on the standard of medical care provided.   Shanon Ace, LCSW

## 2021-03-18 ENCOUNTER — Encounter: Payer: Self-pay | Admitting: Obstetrics and Gynecology

## 2021-03-21 ENCOUNTER — Encounter: Payer: Self-pay | Admitting: Psychiatry

## 2021-03-21 ENCOUNTER — Other Ambulatory Visit: Payer: Self-pay

## 2021-03-21 ENCOUNTER — Ambulatory Visit (INDEPENDENT_AMBULATORY_CARE_PROVIDER_SITE_OTHER): Payer: Medicare Other | Admitting: Psychiatry

## 2021-03-21 DIAGNOSIS — F330 Major depressive disorder, recurrent, mild: Secondary | ICD-10-CM

## 2021-03-21 DIAGNOSIS — F5101 Primary insomnia: Secondary | ICD-10-CM

## 2021-03-21 DIAGNOSIS — F411 Generalized anxiety disorder: Secondary | ICD-10-CM

## 2021-03-21 MED ORDER — BUSPIRONE HCL 10 MG PO TABS: 10.0000 mg | ORAL_TABLET | Freq: Two times a day (BID) | ORAL | 1 refills | Status: DC

## 2021-03-21 MED ORDER — LAMOTRIGINE 150 MG PO TABS: 150.0000 mg | ORAL_TABLET | Freq: Every day | ORAL | 1 refills | Status: DC

## 2021-03-21 MED ORDER — LORAZEPAM 1 MG PO TABS: ORAL_TABLET | ORAL | 5 refills | Status: DC

## 2021-03-21 MED ORDER — DULOXETINE HCL 60 MG PO CPEP: 60.0000 mg | ORAL_CAPSULE | Freq: Every day | ORAL | 1 refills | Status: DC
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
## 2021-03-26 ENCOUNTER — Ambulatory Visit: Payer: Medicare Other | Admitting: Obstetrics and Gynecology

## 2021-04-01 ENCOUNTER — Ambulatory Visit (INDEPENDENT_AMBULATORY_CARE_PROVIDER_SITE_OTHER): Payer: Medicare Other | Admitting: Cardiovascular Disease

## 2021-04-01 ENCOUNTER — Other Ambulatory Visit: Payer: Self-pay

## 2021-04-01 ENCOUNTER — Encounter: Payer: Self-pay | Admitting: Cardiovascular Disease

## 2021-04-01 VITALS — BP 116/76 | HR 73 | Ht 65.0 in | Wt 158.2 lb

## 2021-04-01 DIAGNOSIS — R002 Palpitations: Secondary | ICD-10-CM

## 2021-04-01 DIAGNOSIS — I493 Ventricular premature depolarization: Secondary | ICD-10-CM

## 2021-04-01 DIAGNOSIS — R079 Chest pain, unspecified: Secondary | ICD-10-CM

## 2021-04-01 NOTE — Patient Instructions (Signed)
Medication Instructions:  Your physician recommends that you continue on your current medications as directed. Please refer to the Current Medication list given to you today.  *If you need a refill on your cardiac medications before your next appointment, please call your pharmacy*   Lab Work: None ordered  If you have labs (blood work) drawn today and your tests are completely normal, you will receive your results only by: [place] (if you have MyChart) OR A paper copy in the mail If you have any lab test that is abnormal or we need to change your treatment, we will call you to review the results.   Testing/Procedures: None ordered   Follow-Up: At [facility], you and your health needs are our priority.  As part of our continuing mission to provide you with exceptional heart care, we have created designated Provider Care Teams.  These Care Teams include your primary Cardiologist (physician) and Advanced Practice Providers (APPs -  Physician Assistants and Nurse Practitioners) who all work together to provide you with the care you need, when you need it.  We recommend signing up for the patient portal called "MyChart".  Sign up information is provided on this After Visit Summary.  MyChart is used to connect with patients for Virtual Visits (Telemedicine).  Patients are able to view lab/test results, encounter notes, upcoming appointments, etc.  Non-urgent messages can be sent to your provider as well.   To learn more about what you can do with MyChart, go to [email or web address].    Your next appointment:   AS NEEDED  The format for your next appointment:     Provider:       Other Instructions
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
## 2021-04-02 DIAGNOSIS — M25551 Pain in right hip: Secondary | ICD-10-CM | POA: Diagnosis not present

## 2021-04-03 ENCOUNTER — Encounter (HOSPITAL_COMMUNITY): Payer: Medicare Other

## 2021-04-07 ENCOUNTER — Ambulatory Visit (INDEPENDENT_AMBULATORY_CARE_PROVIDER_SITE_OTHER): Payer: Medicare Other | Admitting: Psychiatry

## 2021-04-07 ENCOUNTER — Other Ambulatory Visit: Payer: Self-pay

## 2021-04-07 DIAGNOSIS — F411 Generalized anxiety disorder: Secondary | ICD-10-CM

## 2021-04-07 NOTE — Progress Notes (Signed)
[facility] Counselor/Therapist Progress Note  Patient ID: [name], MRN: [id number],    Date: 04/07/2021  Time Spent: 57 minutes  Treatment Type: Individual Therapy  Reported Symptoms: anxiety (stronger symptom), depression  Mental Status Exam:  Appearance:   Well Groomed     Behavior:  Appropriate, Sharing, and Motivated  Motor:  Normal except some slowness in moving due to recent fall  Speech/Language:   Clear and Coherent  Affect:  Anxious, depressed  Mood:  anxious and depressed  Thought process:  goal directed  Thought content:    overthinking  Sensory/Perceptual disturbances:    WNL  Orientation:  oriented to person, place, time/date, situation, day of week, month of year, year, and stated date of Feb. 6, 2023  Attention:  Good  Concentration:  Good and Fair  Memory:  "Occasional short term forgetting"  Fund of knowledge:   Good  Insight:    Good  Judgment:   Good  Impulse Control:  Good   Risk Assessment: Danger to Self:  No Self-injurious Behavior: No Danger to Others: No Duty to Warn:no Physical Aggression / Violence:No  Access to Firearms a concern: No  Gang Involvement:No   Subjective: Patient in today reporting depression and anxiety related to health, personal, and family concerns. Sometimes overwhelmed with things I feel I need to get done. Not getting out as much recently. Had good visit with brother recently, after not being close "for years". Reports continued work with her goals.Talked through some individual issues as well as couple issues involving relationship husband and some challenges they've had with intimacy recently. Husband to see a psychiatrist through the [place]. Physically  "still about the same" re: his parkinson's disease. Progressing in some ways including being more involved in her church and feel better about herself,participating in groups at church, having more family contact with daughters, trying to gain more of a sense of  purpose. Still active in zoom class at church, but having to hold off on Pilates due to her back issues. Discussed her continued work on her depressive/anxious thoughts and working to manage them better with less depression and anxiety. Progressing with looking more for what might go right versus wrong and focusing on the positives, in moving further away from "the past".  Interventions: Solution-Oriented/Positive Psychology, Ego-Supportive, and Insight-Oriented  Treatment  Plan:   Patient not signing tx plan updates on computer screen due to [place].  Treatment goals: Goals may remain on tx plan as patient works on strategies to meet her goals. Progress is noted every session in the "Progress" section of Plan. Long term goal: Develop healthy cognitive patterns and beliefs about self and the world that lead to alleviation of depression and anxiety, and help prevent relapse of depression and anxiety. Short term goal: Learn and implement personal skills for managing stress, solving daily problems, and resolving conflicts effectively.  Strategies: Use modeling and role-playing to help recognize anxious/depressive/negative thought patterns that create anxious/depressive/negative feelings and actions, interrupt them and replace with more positive reality-based thoughts that do not support depression.  Diagnosis:   ICD-10-CM   1. Generalized anxiety disorder  F41.1      Plan: Patient today showing good motivation and engagement in session working on her anxiety and depression and noticing some progress as well.  To continue her more positive self talk, more frequent interaction with friends and with family members that are healthy for her, and her goal-directed behaviors to decrease her anxiety and depression as well  as remaining on her medication as prescribed.  Physical pain has decreased with her back situation although still needing some follow-up attention and an MRI from a fall that "hurt my hip" a  while back that has not gotten better.  Seems to be gaining more strength in dealing with family issues and other concerns as they arise versus panic or assuming the worst.  Encouraged her in her practice of more positive behaviors including: Reflecting on her progress more often, for every negative thought create to positives, believing in herself more that she can make further changes that she is hoping to make, getting outside daily as possible, walking her dog, being careful as she moves about especially around and even ground or steps, enjoying her dog which is very therapeutic for her, staying in contact with people that are supportive, interrupting anxious/negative/depressive thoughts and challenging them to replace with more realistic/empowering thoughts, remain in the present and focused on what she can change, staying involved in church activities that she and her husband enjoys, taking breaks throughout the day, allowing her faith to be a part of her emotional healing as well as spiritual, spending enjoyable time with friends and fellow church members, healthy boundaries with others as needed, saying no when she needs to say no without guilt, consistent positive self talk, and feel good about the strength she shows working with goal-directed behaviors to move in a direction that supports improved emotional health.  Goal review and progress/challenges noted with patient.    Next appointment within 3 weeks.  This record has been created using [employer].  Chart creation errors have been sought, but may not always have been located and corrected.  Such creation errors do not reflect on the standard of medical care provided.   Anna Ace, LCSW

## 2021-04-09 ENCOUNTER — Other Ambulatory Visit: Payer: Self-pay

## 2021-04-09 DIAGNOSIS — M5136 Other intervertebral disc degeneration, lumbar region: Secondary | ICD-10-CM | POA: Diagnosis not present

## 2021-04-09 DIAGNOSIS — B009 Herpesviral infection, unspecified: Secondary | ICD-10-CM

## 2021-04-09 DIAGNOSIS — M5416 Radiculopathy, lumbar region: Secondary | ICD-10-CM | POA: Diagnosis not present

## 2021-04-09 DIAGNOSIS — G8929 Other chronic pain: Secondary | ICD-10-CM | POA: Diagnosis not present

## 2021-04-09 DIAGNOSIS — M7062 Trochanteric bursitis, left hip: Secondary | ICD-10-CM | POA: Diagnosis not present

## 2021-04-09 DIAGNOSIS — N952 Postmenopausal atrophic vaginitis: Secondary | ICD-10-CM

## 2021-04-09 DIAGNOSIS — M47816 Spondylosis without myelopathy or radiculopathy, lumbar region: Secondary | ICD-10-CM | POA: Diagnosis not present

## 2021-04-09 DIAGNOSIS — Z01419 Encounter for gynecological examination (general) (routine) without abnormal findings: Secondary | ICD-10-CM

## 2021-04-09 DIAGNOSIS — M7918 Myalgia, other site: Secondary | ICD-10-CM | POA: Diagnosis not present

## 2021-04-09 MED ORDER — VALACYCLOVIR HCL 500 MG PO TABS: ORAL_TABLET | ORAL | 1 refills | Status: DC

## 2021-04-09 MED ORDER — ESTRADIOL 10 MCG VA TABS: ORAL_TABLET | VAGINAL | 1 refills | Status: DC

## 2021-04-09 NOTE — Telephone Encounter (Signed)
Last visit was 03/21/20. Scheduled for AEX 05/20/21.  Neg Mammo 10/04/2020

## 2021-04-12 DIAGNOSIS — M25551 Pain in right hip: Secondary | ICD-10-CM | POA: Diagnosis not present

## 2021-04-21 ENCOUNTER — Ambulatory Visit: Payer: Medicare Other | Admitting: Psychiatry

## 2021-04-23 ENCOUNTER — Other Ambulatory Visit (HOSPITAL_COMMUNITY): Payer: Self-pay | Admitting: *Deleted

## 2021-04-24 ENCOUNTER — Encounter (HOSPITAL_COMMUNITY)
Admission: RE | Admit: 2021-04-24 | Discharge: 2021-04-24 | Disposition: A | Discharge status: Home / Self Care | Payer: Medicare Other | Source: Ambulatory Visit | Attending: Internal Medicine | Admitting: Internal Medicine

## 2021-04-24 DIAGNOSIS — M810 Age-related osteoporosis without current pathological fracture: Secondary | ICD-10-CM | POA: Diagnosis not present

## 2021-04-24 MED ORDER — DENOSUMAB 60 MG/ML ~~LOC~~ SOSY
PREFILLED_SYRINGE | SUBCUTANEOUS | Status: AC
  Filled 2021-04-24: qty 1

## 2021-04-24 MED ORDER — DENOSUMAB 60 MG/ML ~~LOC~~ SOSY
60.0000 mg | PREFILLED_SYRINGE | Freq: Once | SUBCUTANEOUS | Status: AC
  Administered 2021-04-24: 60 mg via SUBCUTANEOUS

## 2021-04-29 DIAGNOSIS — M810 Age-related osteoporosis without current pathological fracture: Secondary | ICD-10-CM | POA: Diagnosis not present

## 2021-04-29 DIAGNOSIS — E039 Hypothyroidism, unspecified: Secondary | ICD-10-CM | POA: Diagnosis not present

## 2021-04-29 DIAGNOSIS — E785 Hyperlipidemia, unspecified: Secondary | ICD-10-CM | POA: Diagnosis not present

## 2021-04-29 DIAGNOSIS — M1990 Unspecified osteoarthritis, unspecified site: Secondary | ICD-10-CM | POA: Diagnosis not present

## 2021-05-01 DIAGNOSIS — Z881 Allergy status to other antibiotic agents status: Secondary | ICD-10-CM | POA: Diagnosis not present

## 2021-05-01 DIAGNOSIS — Z886 Allergy status to analgesic agent status: Secondary | ICD-10-CM | POA: Diagnosis not present

## 2021-05-01 DIAGNOSIS — Z882 Allergy status to sulfonamides status: Secondary | ICD-10-CM | POA: Diagnosis not present

## 2021-05-01 DIAGNOSIS — J343 Hypertrophy of nasal turbinates: Secondary | ICD-10-CM | POA: Diagnosis not present

## 2021-05-01 DIAGNOSIS — J320 Chronic maxillary sinusitis: Secondary | ICD-10-CM | POA: Diagnosis not present

## 2021-05-01 DIAGNOSIS — J300 Vasomotor rhinitis: Secondary | ICD-10-CM | POA: Diagnosis not present

## 2021-05-01 DIAGNOSIS — J3489 Other specified disorders of nose and nasal sinuses: Secondary | ICD-10-CM | POA: Diagnosis not present

## 2021-05-01 DIAGNOSIS — Z885 Allergy status to narcotic agent status: Secondary | ICD-10-CM | POA: Diagnosis not present

## 2021-05-01 DIAGNOSIS — J329 Chronic sinusitis, unspecified: Secondary | ICD-10-CM | POA: Diagnosis not present

## 2021-05-01 DIAGNOSIS — J342 Deviated nasal septum: Secondary | ICD-10-CM | POA: Diagnosis not present

## 2021-05-02 DIAGNOSIS — M7061 Trochanteric bursitis, right hip: Secondary | ICD-10-CM | POA: Diagnosis not present

## 2021-05-02 DIAGNOSIS — M1712 Unilateral primary osteoarthritis, left knee: Secondary | ICD-10-CM | POA: Diagnosis not present

## 2021-05-02 DIAGNOSIS — S76311A Strain of muscle, fascia and tendon of the posterior muscle group at thigh level, right thigh, initial encounter: Secondary | ICD-10-CM | POA: Diagnosis not present

## 2021-05-06 DIAGNOSIS — M7918 Myalgia, other site: Secondary | ICD-10-CM | POA: Diagnosis not present

## 2021-05-06 DIAGNOSIS — G8929 Other chronic pain: Secondary | ICD-10-CM | POA: Diagnosis not present

## 2021-05-07 DIAGNOSIS — F0390 Unspecified dementia without behavioral disturbance: Secondary | ICD-10-CM | POA: Diagnosis not present

## 2021-05-07 DIAGNOSIS — F329 Major depressive disorder, single episode, unspecified: Secondary | ICD-10-CM | POA: Diagnosis not present

## 2021-05-07 DIAGNOSIS — G43009 Migraine without aura, not intractable, without status migrainosus: Secondary | ICD-10-CM | POA: Diagnosis not present

## 2021-05-12 ENCOUNTER — Ambulatory Visit (INDEPENDENT_AMBULATORY_CARE_PROVIDER_SITE_OTHER): Payer: Medicare Other | Admitting: Psychiatry

## 2021-05-12 ENCOUNTER — Other Ambulatory Visit: Payer: Self-pay

## 2021-05-12 DIAGNOSIS — F411 Generalized anxiety disorder: Secondary | ICD-10-CM

## 2021-05-12 NOTE — Progress Notes (Signed)
?    [facility] Counselor/Therapist Progress Note ? ?Patient ID: [name], MRN: [id number],   ? ?Date: 05/12/2021 ? ?Time Spent: 55 minutes  ? ?Treatment Type: Individual Therapy ? ?Reported Symptoms: depression, anxiety ? ?Mental Status Exam: ? ?Appearance:   Well Groomed     ?Behavior:  Appropriate, Sharing, and Motivated  ?Motor:  Normal  ?Speech/Language:   Clear and Coherent  ?Affect:  Anxious, depressed  ?Mood:  anxious and depressed  ?Thought process:  goal directed  ?Thought content:    WNL  ?Sensory/Perceptual disturbances:    WNL  ?Orientation:  oriented to person, place, time/date, situation, day of week, month of year, year, and stated date of May 12, 2021  ?Attention:  Good  ?Concentration:  Good  ?Memory:  "Some short term memory issues at times"  ?Fund of knowledge:   Good  ?Insight:    Good and Fair  ?Judgment:   Good  ?Impulse Control:  Good  ? ?Risk Assessment: ?Danger to Self:  No ?Self-injurious Behavior: No ?Danger to Others: No ?Duty to Warn:no ?Physical Aggression / Violence:No  ?Access to Firearms a concern: No  ?Gang Involvement:No  ? ?Subjective:  Patient in today reporting more physical pain related to hip injury and is being followed for that by orthopedic and pain management. Feeling some better after having 2 injections at pain management.  Issues within family with daughter [name], and some health , personal, and work issues for her. Interactive with husband and both enjoying their dog and going for walks. Processing her concerns re: her own health and her husband's health.  Encouraged her checking out some resource for senior adult for her and her husband at local senior adult center which could offer them more socialization, physical activity, and relationships. Continued concerns about her husband's Parkinson's disease and progression. ?Remains involved in her church which is helpful to her. Shares concerns for future but trying to stay in the present. Working further on her  anxious/depressive thought and trying to better manage them, and feel more positive about herself.  ? ?Interventions: Ego-Supportive and Insight-Oriented ? ?Treatment  Plan:   ?Patient not signing tx plan updates on computer screen due to [place].  ?Treatment goals: ?Goals may remain on tx plan as patient works on strategies to meet her goals. Progress is noted every session in the "Progress" section of Plan. ?Long term goal: ?Develop healthy cognitive patterns and beliefs about self and the world that lead to alleviation of depression and anxiety, and help prevent relapse of depression and anxiety. ?Short term goal: ?Learn and implement personal skills for managing stress, solving daily problems, and resolving conflicts effectively.  ?Strategies: ?Use modeling and role-playing to help recognize anxious/depressive/negative thought patterns that create anxious/depressive/negative feelings and actions, interrupt them and replace with more positive reality-based thoughts that do not support depression ? ?Diagnosis: ?  ICD-10-CM   ?1. Generalized anxiety disorder  F41.1   ?  ? ?Plan: Patient today showing good motivation and engagement in session as she continues working on her depression, family issues, and anxiety. Still having some pain in hip, got pain injections which have helped. Staying in contact with friends, trying to keep up more positive self-talk and be more positive in her outlook, and working to better manage her anxiety and depression focusing on goal-directed behaviors. Encouraged patient in her practice of more positive behaviors including: Believing in herself more that she can make further changes that she is hoping to make, reflecting on her progress more  often, for every negative thought create 2 positives, getting outside daily as possible, walking her dog and spending time with him which is very therapeutic for her, being careful as she moves about especially with uneven ground or steps, staying in  contact with people that are supportive, interrupting anxious/negative/depressive thoughts and challenging them to replace with more realistic/empowering thoughts, remain in the present and focused on what she can change, stay involved in church activities that she and her husband enjoys, taking breaks throughout the day, allowing her faith to be a part of her emotional healing as well as spiritual, spending enjoyable time with friends and fellow church members, healthy boundaries with others as needed, saying no when she needs to say no without guilt, consistent positive self talk, and recognize the strength she shows working with goal-directed behaviors to move in a direction that supports her improved emotional health. ? ?Review and progress/challenges noted with patient. ? ?Next appointment within 2-3 weeks. ? ?This record has been created using [employer].  Chart creation errors have been sought, but may not always have been located and corrected.  Such creation errors do not reflect on the standard of medical care provided. ? ? ?Shanon Ace, LCSW ? ? ? ? ? ? ? ? ? ? ? ? ? ? ? ? ? ? ?

## 2021-05-16 DIAGNOSIS — M25562 Pain in left knee: Secondary | ICD-10-CM | POA: Diagnosis not present
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
## 2021-05-20 ENCOUNTER — Other Ambulatory Visit: Payer: Self-pay

## 2021-05-20 ENCOUNTER — Encounter: Payer: Self-pay | Admitting: Obstetrics and Gynecology

## 2021-05-20 ENCOUNTER — Ambulatory Visit (INDEPENDENT_AMBULATORY_CARE_PROVIDER_SITE_OTHER): Payer: Medicare Other | Admitting: Obstetrics and Gynecology

## 2021-05-20 VITALS — BP 136/80 | HR 68 | Ht 64.25 in | Wt 157.0 lb

## 2021-05-20 DIAGNOSIS — B009 Herpesviral infection, unspecified: Secondary | ICD-10-CM | POA: Diagnosis not present

## 2021-05-20 DIAGNOSIS — Z01419 Encounter for gynecological examination (general) (routine) without abnormal findings: Secondary | ICD-10-CM

## 2021-05-20 DIAGNOSIS — Z9189 Other specified personal risk factors, not elsewhere classified: Secondary | ICD-10-CM | POA: Diagnosis not present

## 2021-05-20 DIAGNOSIS — N952 Postmenopausal atrophic vaginitis: Secondary | ICD-10-CM | POA: Diagnosis not present

## 2021-05-20 MED ORDER — VALACYCLOVIR HCL 500 MG PO TABS: ORAL_TABLET | ORAL | 1 refills | Status: DC

## 2021-05-20 MED ORDER — VALACYCLOVIR HCL 500 MG PO TABS: ORAL_TABLET | ORAL | 11 refills | Status: DC

## 2021-05-20 MED ORDER — ESTRADIOL 2 MG VA RING: 2.0000 mg | VAGINAL_RING | VAGINAL | 3 refills | Status: DC

## 2021-05-20 NOTE — Patient Instructions (Signed)

## 2021-05-23 DIAGNOSIS — M1611 Unilateral primary osteoarthritis, right hip: Secondary | ICD-10-CM | POA: Diagnosis not present

## 2021-05-23 DIAGNOSIS — M1712 Unilateral primary osteoarthritis, left knee: Secondary | ICD-10-CM | POA: Diagnosis not present

## 2021-05-23 DIAGNOSIS — M7061 Trochanteric bursitis, right hip: Secondary | ICD-10-CM | POA: Diagnosis not present

## 2021-05-23 DIAGNOSIS — M170 Bilateral primary osteoarthritis of knee: Secondary | ICD-10-CM | POA: Diagnosis not present

## 2021-05-23 DIAGNOSIS — M1711 Unilateral primary osteoarthritis, right knee: Secondary | ICD-10-CM | POA: Diagnosis not present

## 2021-05-30 DIAGNOSIS — E039 Hypothyroidism, unspecified: Secondary | ICD-10-CM | POA: Diagnosis not present

## 2021-05-30 DIAGNOSIS — E785 Hyperlipidemia, unspecified: Secondary | ICD-10-CM | POA: Diagnosis not present

## 2021-05-30 DIAGNOSIS — M1990 Unspecified osteoarthritis, unspecified site: Secondary | ICD-10-CM | POA: Diagnosis not present

## 2021-05-30 DIAGNOSIS — M810 Age-related osteoporosis without current pathological fracture: Secondary | ICD-10-CM | POA: Diagnosis not present

## 2021-06-02 ENCOUNTER — Ambulatory Visit: Payer: Medicare Other | Admitting: Psychiatry

## 2021-06-05 ENCOUNTER — Other Ambulatory Visit: Payer: Self-pay | Admitting: Psychiatry

## 2021-06-05 ENCOUNTER — Ambulatory Visit: Payer: Medicare Other | Admitting: Psychiatry

## 2021-06-05 DIAGNOSIS — G43019 Migraine without aura, intractable, without status migrainosus: Secondary | ICD-10-CM | POA: Diagnosis not present

## 2021-06-09 DIAGNOSIS — M546 Pain in thoracic spine: Secondary | ICD-10-CM | POA: Diagnosis not present

## 2021-06-09 DIAGNOSIS — M549 Dorsalgia, unspecified: Secondary | ICD-10-CM | POA: Diagnosis not present

## 2021-06-09 DIAGNOSIS — M47814 Spondylosis without myelopathy or radiculopathy, thoracic region: Secondary | ICD-10-CM | POA: Diagnosis not present

## 2021-06-09 DIAGNOSIS — G8929 Other chronic pain: Secondary | ICD-10-CM | POA: Diagnosis not present

## 2021-06-09 DIAGNOSIS — M47816 Spondylosis without myelopathy or radiculopathy, lumbar region: Secondary | ICD-10-CM | POA: Diagnosis not present

## 2021-06-09 DIAGNOSIS — M7918 Myalgia, other site: Secondary | ICD-10-CM | POA: Diagnosis not present

## 2021-06-10 DIAGNOSIS — E039 Hypothyroidism, unspecified: Secondary | ICD-10-CM | POA: Diagnosis not present

## 2021-06-10 DIAGNOSIS — E785 Hyperlipidemia, unspecified: Secondary | ICD-10-CM | POA: Diagnosis not present

## 2021-06-10 DIAGNOSIS — M1990 Unspecified osteoarthritis, unspecified site: Secondary | ICD-10-CM | POA: Diagnosis not present

## 2021-06-10 DIAGNOSIS — M810 Age-related osteoporosis without current pathological fracture: Secondary | ICD-10-CM | POA: Diagnosis not present

## 2021-06-10 DIAGNOSIS — G4733 Obstructive sleep apnea (adult) (pediatric): Secondary | ICD-10-CM | POA: Diagnosis not present

## 2021-06-10 DIAGNOSIS — F329 Major depressive disorder, single episode, unspecified: Secondary | ICD-10-CM | POA: Diagnosis not present

## 2021-06-10 DIAGNOSIS — G43009 Migraine without aura, not intractable, without status migrainosus: Secondary | ICD-10-CM | POA: Diagnosis not present

## 2021-06-10 DIAGNOSIS — F0390 Unspecified dementia without behavioral disturbance: Secondary | ICD-10-CM | POA: Diagnosis not present

## 2021-06-12 DIAGNOSIS — M1712 Unilateral primary osteoarthritis, left knee: Secondary | ICD-10-CM | POA: Diagnosis not present

## 2021-06-16 ENCOUNTER — Telehealth: Payer: Self-pay

## 2021-06-16 NOTE — Telephone Encounter (Signed)
Pt called this email stating that the Rx for estring was really expensive but she qualifies for pt assistance through [employer]. She said that they are requesting our contact info. Specifically email--shes not sure what exactly they will need. Please advise. She did say that we can either call or send her mychart msg with info if possible.  ?

## 2021-06-18 ENCOUNTER — Ambulatory Visit (INDEPENDENT_AMBULATORY_CARE_PROVIDER_SITE_OTHER): Payer: Medicare Other | Admitting: Psychiatry

## 2021-06-18 DIAGNOSIS — F411 Generalized anxiety disorder: Secondary | ICD-10-CM | POA: Diagnosis not present

## 2021-06-18 NOTE — Progress Notes (Signed)
?    [facility] Counselor/Therapist Progress Note ? ?Patient ID: [name], MRN: [id number],   ? ?Date: 06/18/2021 ? ?Time Spent: 58 minutes  ? ?Treatment Type: Individual Therapy ? ?Reported Symptoms: anxiety, depression, sadness ? ?Mental Status Exam: ? ?Appearance:   Casual     ?Behavior:  Appropriate, Sharing, and Motivated  ?Motor:  Normal  ?Speech/Language:   Clear and Coherent  ?Affect:  Depressed and anxious  ?Mood:  anxious  ?Thought process:  goal directed  ?Thought content:    WNL  ?Sensory/Perceptual disturbances:    WNL  ?Orientation:  oriented to person, place, time/date, situation, day of week, month of year, year, and stated date of April19, 2023  ?Attention:  Good  ?Concentration:  Good  ?Memory:  Some short term memory issues and Dr is aware  ?Fund of knowledge:   Good  ?Insight:    Good  ?Judgment:   Good  ?Impulse Control:  Good  ? ?Risk Assessment: ?Danger to Self:  No ?Self-injurious Behavior: No ?Danger to Others: No ?Duty to Warn:no ?Physical Aggression / Violence:No  ?Access to Firearms a concern: No  ?Gang Involvement:No  ? ?Subjective: Patient in today reporting improvement in her hip injury pain but states today her knee is painful as is to have knee injection. Family concerns shared and processed in session today. Patient had called and asked to be seen sooner than her appt was originally scheduled. Shared family confrontations that have occurred recently and were very upsetting to patient. She processed these in detail today in session, some parts very hurtful.  Tearful at times but also showing some strength as she did well in processing the family interactions.  We were able to work on strategies that she can use so as to not allow herself to be treated that way by other family, including role playing some as to how she might manage any further hurtful comments or feelings of being pushed by others.  Will be around supportive friends some over the next few days which will be  healthy for her and seemed to feel much calmer and grounded by end of session. ?  ?Interventions: Solution-Oriented/Positive Psychology, Ego-Supportive, and Insight-Oriented ? ?Treatment  Plan:   ?Patient not signing tx plan updates on computer screen due to [place].  ?Treatment goals: ?Goals may remain on tx plan as patient works on strategies to meet her goals. Progress is noted every session in the "Progress" section of Plan. ?Long term goal: ?Develop healthy cognitive patterns and beliefs about self and the world that lead to alleviation of depression and anxiety, and help prevent relapse of depression and anxiety. ?Short term goal: ?Learn and implement personal skills for managing stress, solving daily problems, and resolving conflicts effectively.  ?Strategies: ?Use modeling and role-playing to help recognize anxious/depressive/negative thought patterns that create anxious/depressive/negative feelings and actions, interrupt them and replace with more positive reality-based thoughts that do not support depression ?  ?Diagnosis: ?  ICD-10-CM   ?1. Generalized anxiety disorder  F41.1   ?  ? ?Plan: Patient today actively engaged in session and showing good motivation as she continues working on her anxiety, depression, and family issues.  Spent most of the session today in reference to some family confrontations that occurred recently as noted above.  These confrontations had been very worrisome for patient and she was having a tough time coping with them so called in for an earlier appointment here.  Did a really good job and talking through and sharing the  situations that had occurred and definitely felt more calm towards the end of session as we worked on some strategies for her to use if any of the situation should re-occur including setting much healthier boundaries and other ways that can help her preserve her own personal boundaries and not be a victim of someone else in the family bullying her.  Smiled more  and left feeling more confidently after session. Encouraged patient to practice more positive behaviors including: Believing in herself more that she can make further changes that she is hoping to make, reflecting further on her progress more often, for every negative thought create to positives, getting outside daily as possible, walking her dog and spending time with him which is very therapeutic for her, staying in contact with people, staying in contact with people who are supportive, interrupting anxious/negative/depressive thoughts and challenging them to replace with more supportive and realistic/empowering thoughts, remain in the present and focused on what she can change, stay involved in her church activities that she and her husband enjoys, taking breaks throughout the day, allowing her faith to be a part of her emotional healing as well as spiritual, spending enjoyable time with friends and fellow church members, healthy boundaries with others as needed, saying no when she needs to say no without guilt, positive self talk, and realize the strength she shows working with goal-directed behaviors to move in a direction that supports her improved emotional health and overall wellbeing. ? ?Goal review and progress/challenges noted with patient. ? ?Next appointment within 2 to 3 weeks. ? ?This record has been created using [employer].  Chart creation errors have been sought, but may not always have been located and corrected.  Such creation errors do not reflect on the standard of medical care provided. ? ? ?Shanon Ace, LCSW ? ? ? ? ? ? ? ? ? ? ? ? ? ? ? ? ? ? ?

## 2021-06-19 DIAGNOSIS — M1712 Unilateral primary osteoarthritis, left knee: Secondary | ICD-10-CM | POA: Diagnosis not present

## 2021-06-20 NOTE — Telephone Encounter (Signed)
Left message to call Jaina Morin, RN at [facility], [phone], OPT 5.  

## 2021-06-20 NOTE — Telephone Encounter (Signed)
Spoke with patient. Patient is requesting assistance with [employer] PAP for Estring. She has already completed and submitted patient portion of forms through [employer]. She has tried Estring saving card, is not eligible. Patient does not want to try vaginal estrogen cream. Advised I will review with Dr. Quincy Simmonds and f/u, patient agreeable.  ?  ?

## 2021-06-23 ENCOUNTER — Ambulatory Visit: Payer: Medicare Other | Admitting: Psychiatry

## 2021-06-23 NOTE — Telephone Encounter (Signed)
Patient notified that Provider portion of [employer] PAP application has been faxed and confirmed. Advised patient she should f/u with [facility] for next steps. Patient verbalizes understanding and is agreeable.  ?

## 2021-06-23 NOTE — Telephone Encounter (Signed)
Provider portion of [employer] PAP application for Estring faxed.  ?

## 2021-06-26 DIAGNOSIS — M546 Pain in thoracic spine: Secondary | ICD-10-CM | POA: Diagnosis not present

## 2021-06-26 DIAGNOSIS — M5134 Other intervertebral disc degeneration, thoracic region: Secondary | ICD-10-CM | POA: Diagnosis not present

## 2021-06-26 DIAGNOSIS — M5124 Other intervertebral disc displacement, thoracic region: Secondary | ICD-10-CM | POA: Diagnosis not present

## 2021-06-26 DIAGNOSIS — G9589 Other specified diseases of spinal cord: Secondary | ICD-10-CM | POA: Diagnosis not present

## 2021-06-30 ENCOUNTER — Ambulatory Visit (INDEPENDENT_AMBULATORY_CARE_PROVIDER_SITE_OTHER): Payer: Medicare Other | Admitting: Psychiatry

## 2021-06-30 DIAGNOSIS — F330 Major depressive disorder, recurrent, mild: Secondary | ICD-10-CM | POA: Diagnosis not present

## 2021-06-30 NOTE — Telephone Encounter (Signed)
Letter received from [place] dated 06/25/21 stating patient enrolled in [place] PAP program until 03/01/22, Estring, quantity #1, to be shipped to office.  ? ?Call placed to patient, advised as seen above. Patient states she was notified by [employer] that she did not qualify for medication assistance. Patient is unsure if the date of her notification from [employer]. Recommended patient contact [place] PAP to confirm and return call to provide update. Patient agreeable.  ?

## 2021-06-30 NOTE — Progress Notes (Signed)
?    [facility] Counselor/Therapist Progress Note ? ?Patient ID: [name], MRN: [id number],   ? ?Date: 06/30/2021 ? ?Time Spent: 57 minutes  ? ?Treatment Type: Individual Therapy ? ?Reported Symptoms:  depression ("stronger symtom"), anxiety ? ?Mental Status Exam: ? ?Appearance:   Casual and Neat     ?Behavior:  Appropriate, Sharing, and Motivated  ?Motor:  Normal  ?Speech/Language:   Clear and Coherent  ?Affect:  Depressed and anxious  ?Mood:  anxious and depressed  ?Thought process:  goal directed  ?Thought content:    WNL  ?Sensory/Perceptual disturbances:    WNL  ?Orientation:  oriented to person, place, time/date, situation, day of week, month of year, year, and stated date of Jun 30, 2021  ?Attention:  Good  ?Concentration:  Good  ?Memory:  Some short term memory issues reported; "most of the time I'm pretty good"  ?Fund of knowledge:   Good  ?Insight:    Good  ?Judgment:   Good  ?Impulse Control:  Good  ? ?Risk Assessment: ?Danger to Self:  No ?Self-injurious Behavior: No ?Danger to Others: No ?Duty to Warn:no ?Physical Aggression / Violence:No  ?Access to Firearms a concern: No  ?Gang Involvement:No  ? ?Subjective:  Patient in today reporting depression as being her main symptom currently, also anxiety. Physical issues involving bulging disc in back that she is currently in treatment for. Depression worse she feels due to some family communication issues with oldest daughter "A", physical problems with some increased pain, and just overwhelmed at times, and is setting some limits with activities to make things more manageable. Physical pain affecting her emotionally. To see Dr soon re: knee pain. Processing some more recent family concerns and difficulty communicating and resolving issues with daughter. No further confrontations with daughter but has not had much contact with her recently. Is concerned per comments from daughter about her memory which have been very critical/hurtful towards patient,  which she was able to process her feelings on this today and feel supported. No tearfulness today and still  showing some strength even with her challenges.  Boundary setting reviewed.  Encouraged her involvement with supportive friends, and also setting what ever limit she needs with outside activities due to physical pain. ? ?Interventions: Solution-Oriented/Positive Psychology, Ego-Supportive, and Insight-Oriented ? ?Treatment  Plan:   ?Patient not signing tx plan updates on computer screen due to [place].  ?Treatment goals: ?Goals may remain on tx plan as patient works on strategies to meet her goals. Progress is noted every session in the "Progress" section of Plan. ?Long term goal: ?Develop healthy cognitive patterns and beliefs about self and the world that lead to alleviation of depression and anxiety, and help prevent relapse of depression and anxiety. ?Short term goal: ?Learn and implement personal skills for managing stress, solving daily problems, and resolving conflicts effectively.  ?Strategies: ?Use modeling and role-playing to help recognize anxious/depressive/negative thought patterns that create anxious/depressive/negative feelings and actions, interrupt them and replace with more positive reality-based thoughts that do not support depression ? ?Diagnosis: ?  ICD-10-CM   ?1. Generalized anxiety disorder  F41.1   ?  ? ?Plan:  Patient today showing good motivation and active participation in session as she focuses on her depression, anxiety, along with some family issues.  Was able to vent and receive support regarding the challenges she faces with physical pain increased right now as well as some continued conflictual interactions with a daughter.  Is awaiting upcoming medical appointments in reference to her  pain and is being good about setting necessary limits for herself and being able to "say no" when needed without guilt.  To continue working with goal-directed behaviors especially in her  managing stress and conflict, and in her feelings about herself, and interrupting unhealthy thought patterns and replacing with more healthy and encouraging thought patterns, which we were able to give some clear examples for patient today. Encouraged patient to be practicing more positive behaviors including: Believing in herself more that she can make further changes, reflecting further on her progress often, getting outside daily as possible, walking her dog and spending time with him which is very therapeutic for her, staying in contact with people who are supportive of her, interrupting anxious/negative/depressive thoughts and challenging them to replace with more supportive and realistic/empowering thoughts, remain in the present and focused on what she can change, stay involved in her church activities that she and her husband enjoys, taking breaks throughout the day, allowing her faith to be a part of her emotional healing as well as spiritual, spending enjoyable time with friends and fellow church members, healthy boundaries with others, saying no when she needs to say no without guilt, setting appropriate limits within her own family, positive self talk, and recognize the strength she shows working with goal directed behaviors to move in a direction that supports her improved emotional health. ? ?Goal review and progress/challenges noted with patient. ? ?Next appointment within 2 to 3 weeks. ? ?This record has been created using [employer].  Chart creation errors have been sought, but may not always have been located and corrected.  Such creation errors do not reflect on the standard of medical care provided. ? ? ?Shanon Ace, LCSW ? ? ? ? ? ? ? ? ? ? ? ? ? ? ? ? ? ? ?

## 2021-06-30 NOTE — Telephone Encounter (Signed)
Patient returned call and left message advising request was approved through [place] PAP.  ? ?Call returned to patient. Advised I will contact her once Rx received in office. Patient agreeable.  ?

## 2021-07-03 NOTE — Telephone Encounter (Signed)
Call placed to patient. Advised Estring received in office from [employer]. Patient will come to office on 5/5 to pick up.  ?

## 2021-07-04 NOTE — Telephone Encounter (Signed)
Patient in office.  ?Estring provided to patient. Questions answered.Patient is aware to return call to office prior to needing refill sent to [place] PAP.  ?[employer] PAP application and packing list for Estring to be scanned in.  ? ?Routing to provider for final review. Patient is agreeable to disposition. Will close encounter. ? ?

## 2021-07-14 ENCOUNTER — Ambulatory Visit (INDEPENDENT_AMBULATORY_CARE_PROVIDER_SITE_OTHER): Payer: Medicare Other | Admitting: Psychiatry

## 2021-07-14 DIAGNOSIS — F411 Generalized anxiety disorder: Secondary | ICD-10-CM

## 2021-07-14 NOTE — Progress Notes (Signed)
?    [facility] Counselor/Therapist Progress Note ? ?Patient ID: [name], MRN: [id number],   ? ?Date: 07/14/2021 ? ?Time Spent: 55 minutes  ? ?Treatment Type: Individual Therapy ? ?Reported Symptoms: depression, anxiety ? ?Mental Status Exam: ? ?Appearance:   Casual     ?Behavior:  Appropriate, Sharing, and Motivated  ?Motor:  Normal  ?Speech/Language:   Clear and Coherent  ?Affect:  Depressed and anxiety  ?Mood:  angry  ?Thought process:  goal directed  ?Thought content:    Some rumination and overthinking  ?Sensory/Perceptual disturbances:    WNL  ?Orientation:  oriented to person, place, time/date, situation, day of week, month of year, year, and stated date of Jul 14, 2021  ?Attention:  Good  ?Concentration:  Good and Fair  ?Memory:  "Some forgetting  and is being scheduled with geriatric neurologist"  ?Fund of knowledge:   Good  ?Insight:    Good  ?Judgment:   Good  ?Impulse Control:  Good and Fair  ? ?Risk Assessment: ?Danger to Self:  No ?Self-injurious Behavior: No ?Danger to Others: No ?Duty to Warn:no ?Physical Aggression / Violence:No  ?Access to Firearms a concern: No  ?Gang Involvement:No  ? ?Subjective:  Patient today reporting anxiety as main symptom, and depression, both of which are related to personal, marital, and family situations/relationships. Recent challenges with adult daughter "A" which have "been improving some more recently. Needing to complete POA papers. Has a headaches today "and hard to be positive".Staying "busy and walking, involved with my church, and friends inside and outside of church. Is concerned about her memory at times and also acknowledges she hasn't tried focusing more and putting really important things in a certain place, which we discussed in more detail and she wants to begin doing this. Back pain is better but knee pain continues and can affect her emotionally. To see her Dr again soon and has several questions to ask him. Family concerns not as volatile most  recently. No further confrontation with one of her daughters who ended up apologizing but expressed mixed feelings. No tearfulness. Showing strength and follow through on goal-directed behaviors discussed in session today.  ? ?Interventions: Solution-Oriented/Positive Psychology and Insight-Oriented ? ?Treatment  Plan:   ?Patient not signing tx plan updates on computer screen due to [place].  ?Treatment goals: ?Goals may remain on tx plan as patient works on strategies to meet her goals. Progress is noted every session in the "Progress" section of Plan. ?Long term goal: ?Develop healthy cognitive patterns and beliefs about self and the world that lead to alleviation of depression and anxiety, and help prevent relapse of depression and anxiety. ?Short term goal: ?Learn and implement personal skills for managing stress, solving daily problems, and resolving conflicts effectively.  ?Strategies: ?Use modeling and role-playing to help recognize anxious/depressive/negative thought patterns that create anxious/depressive/negative feelings and actions, interrupt them and replace with more positive reality-based thoughts that do not support depression ? ?Diagnosis: ?  ICD-10-CM   ?1. Generalized anxiety disorder  F41.1   ?  ? ?Plan:  Patient in today showing good participation and motivation as she worked on her anxiety and some depression but feels that her stronger symptom is anxiety more recently and is related to personal and family situations including some marital and adult child tensions.  Situation with older daughter "A" have toned down and no reocurrences of any outbursts.  Patient stressed with some issues with her husband, his health, and some of her own health issues including  orthopedic and more recently has been concerned about memory.  Feels that it is easy for her to get caught up in several different things and not really focusing as well and not sure how that might or might not be related to occasional  memory concerns.  Does have an appointment coming up with a geriatric neurologist.  As noted above she continues to be very active with a few friends, her family, and her church family who are very supportive.  Is to see her orthopedic doctor soon in reference to continued knee pain.  Continued use of goal-directed behaviors to help manage stress and potential conflict in relationships and trying to have healthier thought patterns that are encouraging versus assuming the worst. Encouraged patient in her practice of more positive behaviors daily including interrupting anxious/negative/depressive thoughts and challenge them to replace with more supportive and realistic/empowering thoughts, believing in herself that she can make further changes, reflecting further on her progress more often, getting outside daily and walking her dog, spending time with her dog which is very therapeutic for her, staying in contact with people who are supportive, remain in the present and focused on what she can change, stay involved in church activities that she and her husband enjoyed together, taking breaks throughout the day as needed, allow her faith to be a part of her emotional healing as well as spiritual, spending enjoyable time with friends and fellow church members, healthy boundaries with others, saying no when she needs to say no without guilt, setting appropriate limits within her own family, positive self talk, and recognize the strength she shows working with goal directed behaviors to move in a direction that supports her improved emotional health and overall wellbeing. ? ?Goal review and progress/challenges noted with patient. ? ?Next appointment within 2 to 3 weeks. ? ?This record has been created using [employer].  Chart creation errors have been sought, but may not always have been located and corrected.  Such creation errors do not reflect on the standard of medical care provided. ? ? ?Shanon Ace,  LCSW ? ? ? ? ? ? ? ? ? ? ? ? ? ? ? ? ? ? ?

## 2021-07-25 DIAGNOSIS — M47814 Spondylosis without myelopathy or radiculopathy, thoracic region: Secondary | ICD-10-CM | POA: Diagnosis not present

## 2021-07-25 DIAGNOSIS — M5134 Other intervertebral disc degeneration, thoracic region: Secondary | ICD-10-CM | POA: Diagnosis not present

## 2021-07-25 DIAGNOSIS — G8929 Other chronic pain: Secondary | ICD-10-CM | POA: Diagnosis not present

## 2021-07-25 DIAGNOSIS — M549 Dorsalgia, unspecified: Secondary | ICD-10-CM | POA: Diagnosis not present

## 2021-08-01 DIAGNOSIS — M1712 Unilateral primary osteoarthritis, left knee: Secondary | ICD-10-CM | POA: Diagnosis not present

## 2021-08-04 ENCOUNTER — Ambulatory Visit (INDEPENDENT_AMBULATORY_CARE_PROVIDER_SITE_OTHER): Payer: Medicare Other | Admitting: Psychiatry

## 2021-08-04 DIAGNOSIS — F330 Major depressive disorder, recurrent, mild: Secondary | ICD-10-CM

## 2021-08-04 NOTE — Progress Notes (Signed)
[facility] Counselor/Therapist Progress Note  Patient ID: [name], MRN: [id number],    Date: 08/04/2021  Time Spent: 55 minutes   Treatment Type: Individual Therapy  Reported Symptoms: depression (main symptom), anxiety, physical pains with back and knee problem  Mental Status Exam:  Appearance:   Casual     Behavior:  Appropriate, Sharing, and Motivated  Motor:  Some affected by her arthritis  Speech/Language:   Clear and Coherent  Affect:  Depressed, anxious  Mood:  anxious and depressed  Thought process:  goal directed  Thought content:    WNL  Sensory/Perceptual disturbances:    WNL  Orientation:  oriented to person, place, time/date, situation, day of week, month of year, year, and stated date of August 04, 2021  Attention:  Good  Concentration:  Good  Memory:  "Some". Has appt for eval later in fall, "maybe" in Sept.   Fund of knowledge:   Good  Insight:    Good and Fair  Judgment:   Good and Fair  Impulse Control:  Good and Fair   Risk Assessment: Danger to Self:  No Self-injurious Behavior: No Danger to Others: No Duty to Warn:no Physical Aggression / Violence:No  Access to Firearms a concern: No  Gang Involvement:No   Subjective:   Patient today reporting depression as main symptom, and influence by her health concerns and some  Denies any SI. Concerned about her back an knee issues and shared this at length today. Does have appt with orthopedic doctor and expecting knee replacement in August 2023. Some personal issues with person they've known quite a while, and hoping to work it out.  Husband "better sometimes than at others".  Still reflect some back on the hurt from daughter, but not as deeply.  No further hurt experienced from daughter.  Mostly concerned right now about health related issues primarily orthopedic.  Staying involved with her church which is very supportive of her.  Also has close friendships with several members there.  Staying "busy  around the house as she is able, walking outside some with her dog and husband, but refraining from getting out to stores etc. as she orders on line. Continues working with goal directed behaviors on treatment goals to maintain her gains thus far.  Interventions: Solution-Oriented/Positive Psychology, Ego-Supportive, and Insight-Oriented  Treatment  Plan:   Patient not signing tx plan updates on computer screen due to [place].  Treatment goals: Goals may remain on tx plan as patient works on strategies to meet her goals. Progress is noted every session in the "Progress" section of Plan. Long term goal: Develop healthy cognitive patterns and beliefs about self and the world that lead to alleviation of depression and anxiety, and help prevent relapse of depression and anxiety. Short term goal: Learn and implement personal skills for managing stress, solving daily problems, and resolving conflicts effectively.  Strategies: Use modeling and role-playing to help recognize anxious/depressive/negative thought patterns that create anxious/depressive/negative feelings and actions, interrupt them and replace with more positive reality-based thoughts that do not support depression   Diagnosis:   ICD-10-CM   1. Major depressive disorder, recurrent episode, mild ([place])  F33.0      Plan: Patient today showing good participation and motivation in working on her depression and anxiety, and coping with health issues.  Finds that contact with others does help her with her depression.  Does not feel as alone.  Was recently confused by 1 supportive persons comments to her but has  done well and not letting that define her nor interrupt her goal-directed behaviors to move forward in managing her depression and anxiety.  Awaiting to hear final details on knee surgery sometime in August.  Has had some back issues but states prior injections help.  To continue to follow through on recommendations per her orthopedic doctor  and encourage remain on her medications as prescribed.  Doing better about staying in the present and not going back to the past.  Remains involved with her church and close to several of the members there.  Trying to not get caught up as much in a lot of different things so as to focus better on a few things as she had wondered previously if that was causing any kind of memory concerns with her.  Has appointment still pending with a geriatric neurologist in the fall. Encouraged patient to practice more of the positive behaviors discussed in sessions including: Interrupting anxious/negative/depressive thoughts and challenge them to replace with more supportive and realistic thoughts, believing in herself that she can make further changes, reflecting further on her progress more often, getting outside daily and walking her dog which is also very therapeutic for patient, staying in contact with people who are supportive, remain in the present and focused on what she can change, stay involved in church activities that she and her husband enjoyed together, taking breaks throughout the day as needed, allow her faith to be a part of her emotional healing as well as spiritual, spending enjoyable time with friends and fellow church members, healthy boundaries with others, saying no when she needs to say no without guilt, setting appropriate limits within her own family, positive self talk, and realize the strength she shows working with goal directed behaviors to move in a direction that supports her improved emotional health.  Goal review and progress/challenges noted with patient.  Next appointment within 2 to 3 weeks.  This record has been created using [employer].  Chart creation errors have been sought, but may not always have been located and corrected.  Such creation errors do not reflect on the standard of medical care provided.   Shanon Ace, LCSW

## 2021-08-18 ENCOUNTER — Ambulatory Visit (INDEPENDENT_AMBULATORY_CARE_PROVIDER_SITE_OTHER): Payer: Medicare Other | Admitting: Psychiatry

## 2021-08-18 DIAGNOSIS — F330 Major depressive disorder, recurrent, mild: Secondary | ICD-10-CM | POA: Diagnosis not present

## 2021-08-18 NOTE — Progress Notes (Signed)
[facility] Counselor/Therapist Progress Note  Patient ID: [name], MRN: [id number],    Date: 08/18/2021  Time Spent: 55 minutes   Treatment Type: Individual Therapy  Reported Symptoms: depression, anxiety  Mental Status Exam:  Appearance:   Casual     Behavior:  Appropriate and Sharing  Motor:  Normal  Speech/Language:   Clear and Coherent  Affect:  Depressed and anxious  Mood:  anxious and depressed  Thought process:  goal directed  Thought content:    WNL  Sensory/Perceptual disturbances:    WNL  Orientation:  oriented to person, place, time/date, situation, day of week, month of year, year, and stated date of August 18, 2021  Attention:  Good  Concentration:  Good  Memory:  Some short term memory concerns and has appt with Neuro  (Sept. 2023)  Fund of knowledge:   Good  Insight:    Good  Judgment:   Good  Impulse Control:  Good   Risk Assessment: Danger to Self:  No Self-injurious Behavior: No Danger to Others: No Duty to Warn:no Physical Aggression / Violence:No  Access to Firearms a concern: No  Gang Involvement:No   Subjective:  Patient in today reporting depression as main symptom but "not quite as bad as last session".  Depression related to husband's Parkinson's disease and how it impacts their lives and relationship. Some sadness, depressed feelings, and anxiety as she tries to manage stressor with her own depression and some health issues, and her husband's Parkinson's. To see neurologist in Sept re: some memory concerns. Knee surgery delayed til October. Increased stress impacting her rise in depression recently.  Denies any SI.  Practicing some things has typically helped her depression/anxiety including more frequent contacts with church friends, reaching out to other friends locally, getting outside and being with the dog some which is very therapeutic for her and husband, practicing relaxation strategies, and spending time with family.  Also to  consider some journaling in between sessions.  Interventions: Solution-Oriented/Positive Psychology and Insight-Oriented  Treatment goals: Goals may remain on tx plan as patient works on strategies to meet her goals. Progress is noted every session in the "Progress" section of Plan. Long term goal: Develop healthy cognitive patterns and beliefs about self and the world that lead to alleviation of depression and anxiety, and help prevent relapse of depression and anxiety. Short term goal: Learn and implement personal skills for managing stress, solving daily problems, and resolving conflicts effectively.  Strategies: Use modeling and role-playing to help recognize anxious/depressive/negative thought patterns that create anxious/depressive/negative feelings and actions, interrupt them and replace with more positive reality-based thoughts that do not support depression  Diagnosis:   ICD-10-CM   1. Major depressive disorder, recurrent episode, mild ([place])  F33.0      Plan:  Patient today showing good motivation and participation as she focused on her anxiety and and depression, including certain triggers and situations that aggravate her anxiety/depression.  Has recognized that in time it usually does get better and that being involved with certain people that are healthy for her, helps.  Also helps her to be involved in activities with her husband or just activities on her own.  Finds peace within her church family.  Patient progressing especially as far as resources and making use of those resources that helps her manage her depression/anxiety.  Does not feel as alone.  Denies any SI.  Encouraged to continue goal-directed behaviors that target depression and anxiety as discussed in session  today, and remembering to stay in the present and maintain contact with others she knows care about her and her husband. Encouraged patient in her practice of more positive behaviors discussed in sessions including:  Interrupting anxious/negative/depressive thoughts and challenging them to replace with more supportive and realistic thoughts, believing in herself that she can make further changes, reflect further on her progress often, getting outside daily, spending time with her dog which is very therapeutic for patient, staying in contact with people who are supportive, remain in the present and focused on what she can control or change, stay involved in church activities that she and her husband enjoyed together, taking breaks throughout the day as needed, allow her faith to be a part of her emotional healing as well as spiritual, spending enjoyable time away with friends, healthy boundaries with others, saying no when she needs to say no without guilt, setting appropriate limits within her own family, positive self talk, and recognize the strength she shows working with goal directed behaviors to move in a direction that supports her improved emotional health and overall wellbeing.  Goal review and progress/challenges noted with patient.  Next appointment within 2 to 3 weeks.  This record has been created using [employer].  Chart creation errors have been sought, but may not always have been located and corrected.  Such creation errors do not reflect on the standard of medical care provided.   Shanon Ace, LCSW

## 2021-08-20 DIAGNOSIS — M5414 Radiculopathy, thoracic region: Secondary | ICD-10-CM | POA: Diagnosis not present

## 2021-09-01 ENCOUNTER — Other Ambulatory Visit: Payer: Self-pay | Admitting: Psychiatry

## 2021-09-01 DIAGNOSIS — F330 Major depressive disorder, recurrent, mild: Secondary | ICD-10-CM

## 2021-09-01 DIAGNOSIS — F411 Generalized anxiety disorder: Secondary | ICD-10-CM

## 2021-09-05 DIAGNOSIS — M47814 Spondylosis without myelopathy or radiculopathy, thoracic region: Secondary | ICD-10-CM | POA: Diagnosis not present

## 2021-09-05 DIAGNOSIS — M7918 Myalgia, other site: Secondary | ICD-10-CM | POA: Diagnosis not present

## 2021-09-05 DIAGNOSIS — M549 Dorsalgia, unspecified: Secondary | ICD-10-CM | POA: Diagnosis not present

## 2021-09-05 DIAGNOSIS — M5134 Other intervertebral disc degeneration, thoracic region: Secondary | ICD-10-CM | POA: Diagnosis not present

## 2021-09-05 DIAGNOSIS — G8929 Other chronic pain: Secondary | ICD-10-CM | POA: Diagnosis not present

## 2021-09-08 ENCOUNTER — Ambulatory Visit (INDEPENDENT_AMBULATORY_CARE_PROVIDER_SITE_OTHER): Payer: Medicare Other | Admitting: Psychiatry

## 2021-09-08 ENCOUNTER — Telehealth: Payer: Self-pay

## 2021-09-08 DIAGNOSIS — F330 Major depressive disorder, recurrent, mild: Secondary | ICD-10-CM | POA: Diagnosis not present

## 2021-09-08 NOTE — Telephone Encounter (Signed)
Patient called requesting to speak with Sharee Pimple, RN about her Estring. "She has all the information."

## 2021-09-08 NOTE — Progress Notes (Signed)
[facility] Counselor/Therapist Progress Note  Patient ID: [name], MRN: [id number],    Date: 09/08/2021  Time Spent: 55 minutes   Treatment Type: Individual Therapy  Reported Symptoms:  depression, anxiety, (knee replacement surgery schedule in 11/2021  Mental Status Exam:  Appearance:   Casual     Behavior:  Appropriate, Sharing, and Motivated  Motor:  Normal but with knee pain; will be having knee replacement later this  Speech/Language:   Clear and Coherent  Affect:  Depressed and anxious  Mood:  anxious and depressed  Thought process:  goal directed  Thought content:    WNL  Sensory/Perceptual disturbances:    WNL  Orientation:  oriented to person, place, time/date, situation, day of week, month of year, year, and stated date of September 08, 2021  Attention:  Good  Concentration:  Good  Memory:  Occasionally forgetting "short term memory"; has appt  in  Sept 2023  Fund of knowledge:   Good  Insight:    Good  Judgment:   Good  Impulse Control:  Good   Risk Assessment: Danger to Self:  No Self-injurious Behavior: No Danger to Others: No Duty to Warn:no Physical Aggression / Violence:No  Access to Firearms a concern: No  Gang Involvement:No   Subjective:  Patient today reporting depression re: personal stresses, "wanting to age gracefully", gained 10 lbs and wanting to "get on that medicine for weight loss."Processed her anxiety and self-care issues related to these and has made contact with her PCP about a possible weight loss medicine. Coping with husband's parkinson's and he is doing pretty well. In Sept, will see neurologist re: memory concerns.Expecting knee replacement surgery in October.  Good contact with church friends and other friends, enjoying their pet dog which is very therapeutic for her. Encouraged to use journaling as a tool between sessions, practice relaxation strategies,  and continue social and church connections.   Interventions: Cognitive  Behavioral Therapy and Ego-Supportive   Treatment goals: Goals may remain on tx plan as patient works on strategies to meet her goals. Progress is noted every session in the "Progress" section of Plan. Long term goal: Develop healthy cognitive patterns and beliefs about self and the world that lead to alleviation of depression and anxiety, and help prevent relapse of depression and anxiety. Short term goal: Learn and implement personal skills for managing stress, solving daily problems, and resolving conflicts effectively.  Strategies: Use modeling and role-playing to help recognize anxious/depressive/negative thought patterns that create anxious/depressive/negative feelings and actions, interrupt them and replace with more positive reality-based thoughts that do not support depression  Diagnosis:   ICD-10-CM   1. Major depressive disorder, recurrent episode, mild ([facility])  F33.0       Plan: Patient today showing good participation and motivation as she discussed some sources of her depression as well as anxiety, triggers and situations that she knows aggravates her depression and anxiety.  States it does tend to "go in, at times" and is experiencing some added stress right now with some pain issues and is to have knee replacement surgery in October 2023.  Issues with family has not been quite as volatile most recently.  Knows that certain people are healthier for her than others and tries to have good boundaries.  Enjoys activities and the people within her church family and is involved with them at least weekly.  Encouraged patient in her continuing goal directed behaviors that target her depression and anxiety as well as fears  of the unknown. Encouraged patient to continue more positive behaviors including: Believing in herself that she can make further changes and feel better about herself now and heading into the future, interrupt anxious/negative/depressive thoughts and challenge them to replace  with more supportive and realistic thoughts, reflect often on her progress, getting outside daily especially with her dog which is very therapeutic for patient, staying in contact with people who are supportive, remain in the present and focused on what she can control or change, remain involved in church activities that she and husband enjoyed together, taking breaks throughout the day as needed, allow her faith to be a part of her emotional healing as well as spiritual, spending enjoyable time away with friends, healthy boundaries with others, saying no when she needs to say no without guilt, setting appropriate limits within her own family, positive self talk, and recognize the strength she shows working with goal directed behaviors to move in a direction that supports her improved emotional health and overall outlook  Goal review and progress/challenges noted with patient.  Next appointment within 2 to 3 weeks.  This record has been created using [employer].  Chart creation errors have been sought, but may not always have been located and corrected.  Such creation errors do not reflect on the standard of medical care provided.  Shanon Ace, LCSW

## 2021-09-09 NOTE — Telephone Encounter (Signed)
Spoke with patient. Patient requesting assistance with [facility] Assistance Program for Estring vaginal ring.   Process has been completed and dispensed x1 as of 09/09/21. Rx in Epic up to date.   Advised I complete prescriber section of application and fax to number on application. Patient responsible for completing patient portion of application and f/u with [facility] for next steps. Patient verbalizes understanding and is agreeable.   Form to Dr. Quincy Simmonds to review and sign.

## 2021-09-09 NOTE — Telephone Encounter (Signed)
Form signed by Dr. Quincy Simmonds and faxed to [employer].

## 2021-09-15 ENCOUNTER — Telehealth: Payer: Self-pay

## 2021-09-15 NOTE — Telephone Encounter (Signed)
Patient is scheduled with Wende Crease, NP for 09/16/21 at 3 pm.

## 2021-09-15 NOTE — Telephone Encounter (Signed)
Patient is having some vulvar irritation. She also c/o that she is not able to remove the Estring. She has had in in for 90 days and this will be first time removing it and she cannot.  Recommend OV to check on vulvar irritation and assist with removing Estring. Transferred to appt desk to schedule.

## 2021-09-15 NOTE — Telephone Encounter (Signed)
Thank you for the update. You may close the encounter. 

## 2021-09-16 ENCOUNTER — Ambulatory Visit (INDEPENDENT_AMBULATORY_CARE_PROVIDER_SITE_OTHER): Payer: Medicare Other | Admitting: Radiology

## 2021-09-16 VITALS — BP 116/74

## 2021-09-16 DIAGNOSIS — N958 Other specified menopausal and perimenopausal disorders: Secondary | ICD-10-CM | POA: Diagnosis not present

## 2021-09-16 DIAGNOSIS — B3731 Acute candidiasis of vulva and vagina: Secondary | ICD-10-CM | POA: Diagnosis not present

## 2021-09-16 MED ORDER — FLUCONAZOLE 150 MG PO TABS: 150.0000 mg | ORAL_TABLET | ORAL | 0 refills | Status: DC

## 2021-09-16 MED ORDER — ESTRADIOL 0.1 MG/GM VA CREA: 1.0000 g | TOPICAL_CREAM | VAGINAL | 12 refills | Status: DC

## 2021-09-16 NOTE — Progress Notes (Signed)
      Subjective: [name] is a 71 y.o. female with history of vulvovaginal atrophy. She was switched to estring from vagifem. She began having vaginal burning and external irritation about 1 week ago, tried to remove estring but could not reach it. She has recently started doing a water exercise class.   Review of Systems  All other systems reviewed and are negative.    Objective:  -Vulva: without lesions or discharge -Vagina: discharge present, wet prep obtained -Cervix: no lesion or discharge, no CMT. Estring removed without difficulty. -Perineum: no lesions -Uterus: Mobile, non tender -Adnexa: no masses or tenderness   Microscopic wet-mount exam shows hyphae.   Chaperone offered and declined.  Assessment:/Plan:  1. Yeast vaginitis  - fluconazole (DIFLUCAN) 150 MG tablet; Take 1 tablet (150 mg total) by mouth every 3 (three) days.  Dispense: 3 tablet; Refill: 0  2. Genitourinary syndrome of menopause Elects to stop using estring, did not want it put back in. Requests rx from estradiol cream, Recommend alternating with vit E suppositories to help with dryness and atrophy  - estradiol (ESTRACE VAGINAL) 0.1 MG/GM vaginal cream; Place 1 g vaginally 3 (three) times a week.  Dispense: 42.5 g; Refill: 12   Avoid intercourse until symptoms are resolved. Safe sex encouraged. Avoid the use of soaps or perfumed products in the peri area. Avoid tub baths and sitting in sweaty or wet clothing for prolonged periods of time.

## 2021-09-17 ENCOUNTER — Other Ambulatory Visit: Payer: Self-pay | Admitting: Radiology

## 2021-09-17 ENCOUNTER — Other Ambulatory Visit: Payer: Self-pay

## 2021-09-17 DIAGNOSIS — B3731 Acute candidiasis of vulva and vagina: Secondary | ICD-10-CM

## 2021-09-17 DIAGNOSIS — N760 Acute vaginitis: Secondary | ICD-10-CM

## 2021-09-17 DIAGNOSIS — M533 Sacrococcygeal disorders, not elsewhere classified: Secondary | ICD-10-CM | POA: Diagnosis not present

## 2021-09-17 DIAGNOSIS — G8929 Other chronic pain: Secondary | ICD-10-CM | POA: Diagnosis not present

## 2021-09-17 DIAGNOSIS — M7918 Myalgia, other site: Secondary | ICD-10-CM | POA: Diagnosis not present

## 2021-09-17 LAB — WET PREP FOR TRICH, YEAST, CLUE

## 2021-09-17 NOTE — Progress Notes (Signed)
Addendum to add order for wet prep

## 2021-09-18 ENCOUNTER — Ambulatory Visit: Payer: Medicare Other | Admitting: Psychiatry

## 2021-09-18 ENCOUNTER — Encounter: Payer: Self-pay | Admitting: Psychiatry

## 2021-09-18 ENCOUNTER — Ambulatory Visit (INDEPENDENT_AMBULATORY_CARE_PROVIDER_SITE_OTHER): Payer: Medicare Other | Admitting: Psychiatry

## 2021-09-18 VITALS — Wt 163.0 lb

## 2021-09-18 DIAGNOSIS — F330 Major depressive disorder, recurrent, mild: Secondary | ICD-10-CM | POA: Diagnosis not present

## 2021-09-18 DIAGNOSIS — F411 Generalized anxiety disorder: Secondary | ICD-10-CM

## 2021-09-18 DIAGNOSIS — F331 Major depressive disorder, recurrent, moderate: Secondary | ICD-10-CM

## 2021-09-18 DIAGNOSIS — F5101 Primary insomnia: Secondary | ICD-10-CM

## 2021-09-18 MED ORDER — BUPROPION HCL ER (XL) 150 MG PO TB24: 150.0000 mg | ORAL_TABLET | Freq: Every day | ORAL | 2 refills | Status: DC

## 2021-09-18 MED ORDER — LORAZEPAM 1 MG PO TABS: ORAL_TABLET | ORAL | 5 refills | Status: DC

## 2021-09-18 MED ORDER — LAMOTRIGINE 150 MG PO TABS: 150.0000 mg | ORAL_TABLET | Freq: Every day | ORAL | 1 refills | Status: DC
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
## 2021-09-22 NOTE — Telephone Encounter (Signed)
Estring received in office from [employer] Patient assistance program.   Call placed to patient. Patient states she has switched to estradiol vaginal cream, does not want to proceed with Estring. Advised patient I will contact [facility] for return of medication. Patient verbalizes understanding and is agreeable.   Call placed to [facility] at [phone], spoke with Mel Almond. Was advised no return on medication. Medication should be disposed.   Medication disposed.   Routing to Dr. Antony Blackbird.   Encounter closed.

## 2021-09-24 ENCOUNTER — Other Ambulatory Visit: Payer: Self-pay

## 2021-09-24 DIAGNOSIS — F5101 Primary insomnia: Secondary | ICD-10-CM

## 2021-09-24 DIAGNOSIS — F330 Major depressive disorder, recurrent, mild: Secondary | ICD-10-CM

## 2021-09-24 DIAGNOSIS — F411 Generalized anxiety disorder: Secondary | ICD-10-CM

## 2021-09-24 MED ORDER — LAMOTRIGINE 150 MG PO TABS: 150.0000 mg | ORAL_TABLET | Freq: Every day | ORAL | 1 refills | Status: DC

## 2021-09-24 MED ORDER — BUSPIRONE HCL 10 MG PO TABS: 10.0000 mg | ORAL_TABLET | Freq: Two times a day (BID) | ORAL | 1 refills | Status: DC

## 2021-09-24 MED ORDER — DULOXETINE HCL 60 MG PO CPEP: 60.0000 mg | ORAL_CAPSULE | Freq: Every day | ORAL | 1 refills | Status: DC

## 2021-09-24 NOTE — Telephone Encounter (Signed)
Pt asked for Rx's to be changed to [facility] to be more affordable.

## 2021-09-26 DIAGNOSIS — G43009 Migraine without aura, not intractable, without status migrainosus: Secondary | ICD-10-CM | POA: Diagnosis not present

## 2021-09-26 DIAGNOSIS — G4733 Obstructive sleep apnea (adult) (pediatric): Secondary | ICD-10-CM | POA: Diagnosis not present

## 2021-09-26 DIAGNOSIS — F0390 Unspecified dementia without behavioral disturbance: Secondary | ICD-10-CM | POA: Diagnosis not present

## 2021-09-26 DIAGNOSIS — K219 Gastro-esophageal reflux disease without esophagitis: Secondary | ICD-10-CM | POA: Diagnosis not present

## 2021-09-26 DIAGNOSIS — F329 Major depressive disorder, single episode, unspecified: Secondary | ICD-10-CM | POA: Diagnosis not present

## 2021-09-26 DIAGNOSIS — M1990 Unspecified osteoarthritis, unspecified site: Secondary | ICD-10-CM | POA: Diagnosis not present

## 2021-09-26 DIAGNOSIS — E039 Hypothyroidism, unspecified: Secondary | ICD-10-CM | POA: Diagnosis not present

## 2021-09-26 DIAGNOSIS — M810 Age-related osteoporosis without current pathological fracture: Secondary | ICD-10-CM | POA: Diagnosis not present

## 2021-09-26 DIAGNOSIS — N3281 Overactive bladder: Secondary | ICD-10-CM | POA: Diagnosis not present

## 2021-09-26 DIAGNOSIS — E785 Hyperlipidemia, unspecified: Secondary | ICD-10-CM | POA: Diagnosis not present

## 2021-09-26 DIAGNOSIS — E663 Overweight: Secondary | ICD-10-CM | POA: Diagnosis not present

## 2021-09-29 ENCOUNTER — Ambulatory Visit (INDEPENDENT_AMBULATORY_CARE_PROVIDER_SITE_OTHER): Payer: Medicare Other | Admitting: Psychiatry

## 2021-09-29 DIAGNOSIS — F411 Generalized anxiety disorder: Secondary | ICD-10-CM | POA: Diagnosis not present

## 2021-09-29 NOTE — Progress Notes (Signed)
[facility] Counselor/Therapist Progress Note  Patient ID: [name], MRN: [id number],    Date: 09/29/2021  Time Spent: 50 minutes   Treatment Type: Individual Therapy  Reported Symptoms: anxiety, has been more depression but "now am mostly anxious about my upcoming knee surgery". States depression was mostly about "my aging"  Mental Status Exam:  Appearance:   Neat     Behavior:  Appropriate, Sharing, and Motivated  Motor:  Normal  Speech/Language:   Clear and Coherent  Affect:  anxious  Mood:  anxious and some depression  Thought process:  goal directed  Thought content:    WNL  Sensory/Perceptual disturbances:    WNL  Orientation:  oriented to person, place, time/date, situation, day of week, month of year, year, and stated date of September 29, 2021  Attention:  Good  Concentration:  Good  Memory:  Some short term memory issues continue; states she has appt in Sept with neurologist  Fund of knowledge:   Good  Insight:    Good  Judgment:   Good  Impulse Control:  Good   Risk Assessment: Danger to Self:  No Self-injurious Behavior: No Danger to Others: No Duty to Warn:no Physical Aggression / Violence:No  Access to Firearms a concern: No  Gang Involvement:No   Subjective: Patient today needing session to process her anxiety about her upcoming knee surgery, and husband's cognitive issues and "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 Discussed these concerns in session today which seemed to be helpful, along with looking at her support system and processed some of her more anxious/stressful thoughts. Worries/stressed "over my aging". Continues coping with her husband's Parkinson's and the challenges involved for husband and their relationship.  Patient's knee replacement surgery was moved up from October to next week as surgeon had a cancellation.  Patient is expecting to see a neurologist regarding some memory concerns in September.  Maintains good  contact with friends within her church, enjoys her dog who is very therapeutic for her and husband, and finds that her faith is a big help to her.  To continue goal-directed behaviors especially in regards to her anxiety and depression which more recently has been focused on her aging.  Interventions: Cognitive Behavioral Therapy  Treatment goals: Goals may remain on tx plan as patient works on strategies to meet her goals. Progress is noted every session in the "Progress" section of Plan. Long term goal: Develop healthy cognitive patterns and beliefs about self and the world that lead to alleviation of depression and anxiety, and help prevent relapse of depression and anxiety. Short term goal: Learn and implement personal skills for managing stress, solving daily problems, and resolving conflicts effectively.  Strategies: Use modeling and role-playing to help recognize anxious/depressive/negative thought patterns that create anxious/depressive/negative feelings and actions, interrupt them and replace with more positive reality-based thoughts that do not support depression  Diagnosis:   ICD-10-CM   1. 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  Reports some depression but feels that she is really experienced more anxiety.  Did well in session today processing her concerns and looking at what she can control versus cannot, and also some best practices and strategies for working with her anxious thoughts and worries, including focusing on the fact  at this point they are just thoughts and not necessarily bad things that will end up happening.  The more she processes her thoughts the more grounded she seems to become.  Is a stressful time for her especially with her surgery being  bumped up to a closer date, which she actually wanted.  Acknowledges that regardless of the date, she would likely be having some anxiety and talked through the surgery-related anxiety in more detail today which seemed helpful to her.  Family situations have not been as volatile nor stressful in recent weeks.  Encouraged patient to use some journaling in between sessions as that might be helpful with anxious thoughts.  She knows she will have good support through her church during her time of surgery and afterwards. Encouraged patient in her goal directed behaviors targeting her depression and anxiety as well as fears of the unknown.  To continue more positive behaviors including: Believing in herself that she can make further changes and feel better about herself now and into the future, interrupting anxious/depressive thoughts and challenge them to replace with more supportive and realistic thoughts, getting outside daily especially with her dog which is very therapeutic for her, staying in contact with people who are supportive, stay in the present and focused on what she can control or change, stay involved in church activities that she and husband enjoyed together, allowing her faith to be a part of her emotional healing as well as spiritual, having enjoyable time away with friends, using healthy boundaries with others, saying no when she needs to say no, using appropriate limits within her own family, more positive self talk, and realize the strength she shows working with goal directed behaviors to move in a direction that supports her improved emotional health.  Goal review and progress/challenges noted with patient.  Next appointment within 3 weeks.  This record has been created using [employer].  Chart creation errors have been sought, but may not always have been located and corrected.  Such creation errors do not reflect on the standard of medical care provided.   Shanon Ace,  LCSW

## 2021-09-30 NOTE — Progress Notes (Signed)
COVID Vaccine Completed:  Date of COVID positive in last 90 days:  PCP - Prince Solian, MD Cardiologist - Mertie Moores, MD  Chest x-ray -  EKG - 04-01-21 Epic Stress Test - greater than 2 years Epic ECHO - greater than 2 years Epic Cardiac Cath -  Pacemaker/ICD device last checked: Spinal Cord Stimulator: Coronary CT - 2020 Epic Telemetry monitor - 2016 Epic  Bowel Prep -   Sleep Study -  CPAP -   Fasting Blood Sugar -  Checks Blood Sugar _____ times a day  Blood Thinner Instructions: Aspirin Instructions: Last Dose:  Activity level:  Can go up a flight of stairs and perform activities of daily living without stopping and without symptoms of chest pain or shortness of breath.  Able to exercise without symptoms  Unable to go up a flight of stairs without symptoms of     Anesthesia review:  MVP, murmur, hx of chest pain and palpitations, PVCs, shortness of breath  Patient denies shortness of breath, fever, cough and chest pain at PAT appointment  Patient verbalized understanding of instructions that were given to them at the PAT appointment. Patient was also instructed that they will need to review over the PAT instructions again at home before surgery.

## 2021-09-30 NOTE — Patient Instructions (Addendum)
SURGICAL WAITING ROOM VISITATION Patients having surgery or a procedure may have no more than 2 support people in the waiting area - these visitors may rotate.   Children under the age of 71 must have an adult with them who is not the patient. If the patient needs to stay at the hospital during part of their recovery, the visitor guidelines for inpatient rooms apply. Pre-op nurse will coordinate an appropriate time for 1 support person to accompany patient in pre-op.  This support person may not rotate.    Please refer to the [facility] website for the visitor guidelines for Inpatients (after your surgery is over and you are in a regular room).      Your procedure is scheduled on: 10-06-21   Report to [facility] Main Entrance    Report to admitting at 10:15 AM   Call this number if you have problems the morning of surgery [phone]   Do not eat food :After Midnight.   After Midnight you may have the following liquids until 9:45 AM DAY OF SURGERY  Water Non-Citrus Juices (without pulp, NO RED) Carbonated Beverages Black Coffee (NO MILK/CREAM OR CREAMERS, sugar ok)  Clear Tea (NO MILK/CREAM OR CREAMERS, sugar ok) regular and decaf                             Plain Jell-O (NO RED)                                           Fruit ices (not with fruit pulp, NO RED)                                     Popsicles (NO RED)                                                               Sports drinks like Gatorade (NO RED)                 The day of surgery:  Drink ONE (1) Pre-Surgery Clear Ensure at 9:45 AM the morning of surgery. Drink in one sitting. Do not sip.  This drink was given to you during your hospital  pre-op appointment visit. Nothing else to drink after completing the Pre-Surgery Clear Ensure           If you have questions, please contact your surgeon's office.   FOLLOW ANY ADDITIONAL PRE OP INSTRUCTIONS YOU RECEIVED FROM YOUR SURGEON'S OFFICE!!!     Oral  Hygiene is also important to reduce your risk of infection.                                    Remember - BRUSH YOUR TEETH THE MORNING OF SURGERY WITH YOUR REGULAR TOOTHPASTE   Do NOT smoke after Midnight  Take these medicines the morning of surgery with A SIP OF WATER: Bupropion, Periactin, Duloxetine, Lamotrigine, Levothyroxine, Lorazepam, Aciphex, Valcyclovir, Vibegron, Rosuvastatin.  Okay to use Tylenol  and nasal spray if needed  Bring CPAP mask and tubing day of surgery.                              You may not have any metal on your body including hair pins, jewelry, and body piercing             Do not wear make-up, lotions, powders, perfumes or deodorant  Do not wear nail polish including gel and S&S, artificial/acrylic nails, or any other type of covering on natural nails including finger and toenails. If you have artificial nails, gel coating, etc. that needs to be removed by a nail salon please have this removed prior to surgery or surgery may need to be canceled/ delayed if the surgeon/ anesthesia feels like they are unable to be safely monitored.   Do not shave  48 hours prior to surgery.    Do not bring valuables to the hospital. [place].   Contacts, dentures or bridgework may not be worn into surgery.   Bring small overnight bag day of surgery.   DO NOT [place]. PHARMACY WILL DISPENSE MEDICATIONS LISTED ON YOUR MEDICATION LIST TO YOU DURING YOUR ADMISSION [place]!   Please read over the following fact sheets you were given: IF YOU HAVE QUESTIONS ABOUT YOUR PRE-OP INSTRUCTIONS PLEASE CALL [facility] - Preparing for Surgery Before surgery, you can play an important role.  Because skin is not sterile, your skin needs to be as free of germs as possible.  You can reduce the number of germs on your skin by washing with CHG (chlorahexidine gluconate) soap before surgery.  CHG is  an antiseptic cleaner which kills germs and bonds with the skin to continue killing germs even after washing. Please DO NOT use if you have an allergy to CHG or antibacterial soaps.  If your skin becomes reddened/irritated stop using the CHG and inform your nurse when you arrive at Short Stay. Do not shave (including legs and underarms) for at least 48 hours prior to the first CHG shower.  You may shave your face/neck.  Please follow these instructions carefully:  1.  Shower with CHG Soap the night before surgery and the  morning of surgery.  2.  If you choose to wash your hair, wash your hair first as usual with your normal  shampoo.  3.  After you shampoo, rinse your hair and body thoroughly to remove the shampoo.                             4.  Use CHG as you would any other liquid soap.  You can apply chg directly to the skin and wash.  Gently with a scrungie or clean washcloth.  5.  Apply the CHG Soap to your body ONLY FROM THE NECK DOWN.   Do   not use on face/ open                           Wound or open sores. Avoid contact with eyes, ears mouth and   genitals (private parts).                       Wash face,  Genitals (private parts) with your  normal soap.             6.  Wash thoroughly, paying special attention to the area where your    surgery  will be performed.  7.  Thoroughly rinse your body with warm water from the neck down.  8.  DO NOT shower/wash with your normal soap after using and rinsing off the CHG Soap.                9.  Pat yourself dry with a clean towel.            10.  Wear clean pajamas.            11.  Place clean sheets on your bed the night of your first shower and do not  sleep with pets. Day of Surgery : Do not apply any lotions/deodorants the morning of surgery.  Please wear clean clothes to the hospital/surgery center.  FAILURE TO FOLLOW THESE INSTRUCTIONS MAY RESULT IN THE CANCELLATION OF YOUR SURGERY  PATIENT  SIGNATURE_________________________________  NURSE SIGNATURE__________________________________  ________________________________________________________________________     Adam Phenix  An incentive spirometer is a tool that can help keep your lungs clear and active. This tool measures how well you are filling your lungs with each breath. Taking long deep breaths may help reverse or decrease the chance of developing breathing (pulmonary) problems (especially infection) following: A long period of time when you are unable to move or be active. BEFORE THE PROCEDURE  If the spirometer includes an indicator to show your best effort, your nurse or respiratory therapist will set it to a desired goal. If possible, sit up straight or lean slightly forward. Try not to slouch. Hold the incentive spirometer in an upright position. INSTRUCTIONS FOR USE  Sit on the edge of your bed if possible, or sit up as far as you can in bed or on a chair. Hold the incentive spirometer in an upright position. Breathe out normally. Place the mouthpiece in your mouth and seal your lips tightly around it. Breathe in slowly and as deeply as possible, raising the piston or the ball toward the top of the column. Hold your breath for 3-5 seconds or for as long as possible. Allow the piston or ball to fall to the bottom of the column. Remove the mouthpiece from your mouth and breathe out normally. Rest for a few seconds and repeat Steps 1 through 7 at least 10 times every 1-2 hours when you are awake. Take your time and take a few normal breaths between deep breaths. The spirometer may include an indicator to show your best effort. Use the indicator as a goal to work toward during each repetition. After each set of 10 deep breaths, practice coughing to be sure your lungs are clear. If you have an incision (the cut made at the time of surgery), support your incision when coughing by placing a pillow or rolled up  towels firmly against it. Once you are able to get out of bed, walk around indoors and cough well. You may stop using the incentive spirometer when instructed by your caregiver.  RISKS AND COMPLICATIONS Take your time so you do not get dizzy or light-headed. If you are in pain, you may need to take or ask for pain medication before doing incentive spirometry. It is harder to take a deep breath if you are having pain. AFTER USE Rest and breathe slowly and easily. It can be helpful to keep track of a log  of your progress. Your caregiver can provide you with a simple table to help with this. If you are using the spirometer at home, follow these instructions: [place] IF:  You are having difficultly using the spirometer. You have trouble using the spirometer as often as instructed. Your pain medication is not giving enough relief while using the spirometer. You develop fever of 100.5 F (38.1 C) or higher. SEEK IMMEDIATE MEDICAL CARE IF:  You cough up bloody sputum that had not been present before. You develop fever of 102 F (38.9 C) or greater. You develop worsening pain at or near the incision site. MAKE SURE YOU:  Understand these instructions. Will watch your condition. Will get help right away if you are not doing well or get worse. Document Released: 06/29/2006 Document Revised: 05/11/2011 Document Reviewed: 08/30/2006 [facility] Patient Information 2014 [place], [place].   ________________________________________________________________________

## 2021-10-01 ENCOUNTER — Other Ambulatory Visit: Payer: Self-pay

## 2021-10-01 ENCOUNTER — Encounter (HOSPITAL_COMMUNITY)
Admission: RE | Admit: 2021-10-01 | Discharge: 2021-10-01 | Disposition: A | Discharge status: Home / Self Care | Payer: Medicare Other | Source: Ambulatory Visit | Attending: Orthopedic Surgery | Admitting: Orthopedic Surgery

## 2021-10-01 ENCOUNTER — Encounter (HOSPITAL_COMMUNITY): Payer: Self-pay

## 2021-10-01 VITALS — BP 140/84 | HR 78 | Temp 98.1°F | Resp 20 | Ht 65.0 in | Wt 161.4 lb

## 2021-10-01 DIAGNOSIS — Z01812 Encounter for preprocedural laboratory examination: Secondary | ICD-10-CM | POA: Insufficient documentation

## 2021-10-01 DIAGNOSIS — I2510 Atherosclerotic heart disease of native coronary artery without angina pectoris: Secondary | ICD-10-CM

## 2021-10-01 DIAGNOSIS — B009 Herpesviral infection, unspecified: Secondary | ICD-10-CM

## 2021-10-01 DIAGNOSIS — Z01818 Encounter for other preprocedural examination: Secondary | ICD-10-CM

## 2021-10-01 HISTORY — DX: Basal cell carcinoma of skin of nose: C44.311

## 2021-10-01 LAB — SURGICAL PCR SCREEN
MRSA, PCR: NEGATIVE
Staphylococcus aureus: NEGATIVE

## 2021-10-01 MED ORDER — VALACYCLOVIR HCL 500 MG PO TABS: ORAL_TABLET | ORAL | 1 refills | Status: DC

## 2021-10-01 NOTE — Telephone Encounter (Signed)
Medication refill request: valacyclovir '500mg'$  Last AEX:  05-20-21 Next AEX: 05-22-22 Last MMG (if hormonal medication request): n/a Refill authorized: patient is requesting rx [facility]. Please approve if appropriate.
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
## 2021-10-03 ENCOUNTER — Other Ambulatory Visit: Payer: Self-pay

## 2021-10-03 DIAGNOSIS — F330 Major depressive disorder, recurrent, mild: Secondary | ICD-10-CM

## 2021-10-03 MED ORDER — BUPROPION HCL ER (XL) 150 MG PO TB24: 150.0000 mg | ORAL_TABLET | Freq: Every day | ORAL | 1 refills | Status: DC

## 2021-10-06 ENCOUNTER — Observation Stay (HOSPITAL_COMMUNITY)
Admission: RE | Admit: 2021-10-06 | Discharge: 2021-10-08 | Disposition: A | Discharge status: Home / Self Care | Payer: Medicare Other | Attending: Orthopedic Surgery | Admitting: Orthopedic Surgery

## 2021-10-06 ENCOUNTER — Other Ambulatory Visit: Payer: Self-pay

## 2021-10-06 ENCOUNTER — Ambulatory Visit (HOSPITAL_BASED_OUTPATIENT_CLINIC_OR_DEPARTMENT_OTHER): Payer: Medicare Other | Admitting: Anesthesiology

## 2021-10-06 ENCOUNTER — Ambulatory Visit (HOSPITAL_COMMUNITY): Payer: Medicare Other | Admitting: Physician Assistant

## 2021-10-06 ENCOUNTER — Encounter (HOSPITAL_COMMUNITY): Payer: Self-pay | Admitting: Orthopedic Surgery

## 2021-10-06 ENCOUNTER — Encounter (HOSPITAL_COMMUNITY)
Admission: RE | Disposition: A | Discharge status: Home / Self Care | Payer: Self-pay | Source: Home / Self Care | Attending: Orthopedic Surgery

## 2021-10-06 DIAGNOSIS — Z01818 Encounter for other preprocedural examination: Secondary | ICD-10-CM

## 2021-10-06 DIAGNOSIS — Z7982 Long term (current) use of aspirin: Secondary | ICD-10-CM | POA: Insufficient documentation

## 2021-10-06 DIAGNOSIS — Z96651 Presence of right artificial knee joint: Secondary | ICD-10-CM | POA: Diagnosis not present

## 2021-10-06 DIAGNOSIS — E039 Hypothyroidism, unspecified: Secondary | ICD-10-CM | POA: Diagnosis not present

## 2021-10-06 DIAGNOSIS — M1712 Unilateral primary osteoarthritis, left knee: Principal | ICD-10-CM | POA: Diagnosis present

## 2021-10-06 DIAGNOSIS — Z85828 Personal history of other malignant neoplasm of skin: Secondary | ICD-10-CM | POA: Insufficient documentation

## 2021-10-06 DIAGNOSIS — Z79899 Other long term (current) drug therapy: Secondary | ICD-10-CM | POA: Insufficient documentation

## 2021-10-06 DIAGNOSIS — G8918 Other acute postprocedural pain: Secondary | ICD-10-CM | POA: Diagnosis not present

## 2021-10-06 DIAGNOSIS — Z79890 Hormone replacement therapy (postmenopausal): Secondary | ICD-10-CM | POA: Insufficient documentation

## 2021-10-06 DIAGNOSIS — I2510 Atherosclerotic heart disease of native coronary artery without angina pectoris: Secondary | ICD-10-CM

## 2021-10-06 HISTORY — PX: TOTAL KNEE ARTHROPLASTY: SHX125

## 2021-10-06 LAB — BASIC METABOLIC PANEL
Anion gap: 8 (ref 5–15)
BUN: 15 mg/dL (ref 8–23)
CO2: 27 mmol/L (ref 22–32)
Calcium: 9.3 mg/dL (ref 8.9–10.3)
Chloride: 102 mmol/L (ref 98–111)
Creatinine, Ser: 1.09 mg/dL — ABNORMAL HIGH (ref 0.44–1.00)
GFR, Estimated: 54 mL/min — ABNORMAL LOW (ref >60)
Glucose, Bld: 109 mg/dL — ABNORMAL HIGH (ref 70–99)
Potassium: 5.1 mmol/L (ref 3.5–5.1)
Sodium: 137 mmol/L (ref 135–145)

## 2021-10-06 LAB — CBC
HCT: 42.6 % (ref 36.0–46.0)
Hemoglobin: 14.3 g/dL (ref 12.0–15.0)
MCH: 35.6 pg — ABNORMAL HIGH (ref 26.0–34.0)
MCHC: 33.6 g/dL (ref 30.0–36.0)
MCV: 106 fL — ABNORMAL HIGH (ref 80.0–100.0)
Platelets: 179 10*3/uL (ref 150–400)
RBC: 4.02 MIL/uL (ref 3.87–5.11)
RDW: 13.2 % (ref 11.5–15.5)
WBC: 7.6 10*3/uL (ref 4.0–10.5)
nRBC: 0 % (ref 0.0–0.2)

## 2021-10-06 SURGERY — ARTHROPLASTY, KNEE, TOTAL
Anesthesia: Spinal | Site: Knee | Laterality: Left

## 2021-10-06 MED ORDER — CLONIDINE HCL (ANALGESIA) 100 MCG/ML EP SOLN
EPIDURAL | Status: DC | PRN
  Administered 2021-10-06: 50 ug

## 2021-10-06 MED ORDER — STERILE WATER FOR IRRIGATION IR SOLN
Status: DC | PRN
  Administered 2021-10-06: 1000 mL

## 2021-10-06 MED ORDER — SODIUM CHLORIDE 0.9 % IV SOLN
INTRAVENOUS | Status: DC
Start: 2021-10-06 — End: 2021-10-08

## 2021-10-06 MED ORDER — BACLOFEN 10 MG PO TABS
10.0000 mg | ORAL_TABLET | Freq: Two times a day (BID) | ORAL | Status: DC
  Administered 2021-10-06 – 2021-10-08 (×4): 10 mg via ORAL
  Filled 2021-10-06 (×4): qty 1

## 2021-10-06 MED ORDER — ONDANSETRON HCL 4 MG/2ML IJ SOLN: 4.0000 mg | Freq: Four times a day (QID) | INTRAMUSCULAR | Status: DC | PRN

## 2021-10-06 MED ORDER — NITROGLYCERIN 0.4 MG SL SUBL: 0.4000 mg | SUBLINGUAL_TABLET | SUBLINGUAL | Status: DC | PRN

## 2021-10-06 MED ORDER — PROPOFOL 500 MG/50ML IV EMUL
INTRAVENOUS | Status: DC | PRN
  Administered 2021-10-06: 80 ug/kg/min via INTRAVENOUS

## 2021-10-06 MED ORDER — LACTATED RINGERS IV SOLN: INTRAVENOUS | Status: DC

## 2021-10-06 MED ORDER — HYDROMORPHONE HCL 1 MG/ML IJ SOLN
0.5000 mg | INTRAMUSCULAR | Status: DC | PRN
  Administered 2021-10-06 – 2021-10-07 (×3): 1 mg via INTRAVENOUS
  Filled 2021-10-06 (×3): qty 1

## 2021-10-06 MED ORDER — CEFAZOLIN SODIUM-DEXTROSE 2-4 GM/100ML-% IV SOLN
2.0000 g | INTRAVENOUS | Status: AC
Start: 2021-10-06 — End: 2021-10-06
  Administered 2021-10-06: 2 g via INTRAVENOUS
  Filled 2021-10-06: qty 100

## 2021-10-06 MED ORDER — ACETAMINOPHEN 500 MG PO TABS
1000.0000 mg | ORAL_TABLET | Freq: Four times a day (QID) | ORAL | Status: AC
  Administered 2021-10-06 – 2021-10-07 (×4): 1000 mg via ORAL
  Filled 2021-10-06 (×4): qty 2

## 2021-10-06 MED ORDER — MIRABEGRON ER 25 MG PO TB24
25.0000 mg | ORAL_TABLET | Freq: Every day | ORAL | Status: DC
  Administered 2021-10-07 – 2021-10-08 (×2): 25 mg via ORAL
  Filled 2021-10-06 (×2): qty 1

## 2021-10-06 MED ORDER — ORAL CARE MOUTH RINSE: 15.0000 mL | Freq: Once | OROMUCOSAL | Status: AC

## 2021-10-06 MED ORDER — POLYETHYLENE GLYCOL 3350 17 G PO PACK: 17.0000 g | PACK | Freq: Every day | ORAL | Status: DC | PRN

## 2021-10-06 MED ORDER — MIDAZOLAM HCL 2 MG/2ML IJ SOLN
1.0000 mg | INTRAMUSCULAR | Status: DC
  Administered 2021-10-06: 1 mg via INTRAVENOUS

## 2021-10-06 MED ORDER — BISACODYL 10 MG RE SUPP: 10.0000 mg | Freq: Every day | RECTAL | Status: DC | PRN

## 2021-10-06 MED ORDER — ATENOLOL 25 MG PO TABS
25.0000 mg | ORAL_TABLET | Freq: Every day | ORAL | Status: DC
  Administered 2021-10-07: 25 mg via ORAL
  Filled 2021-10-06: qty 1

## 2021-10-06 MED ORDER — LAMOTRIGINE 25 MG PO TABS
150.0000 mg | ORAL_TABLET | Freq: Two times a day (BID) | ORAL | Status: DC
  Administered 2021-10-06 – 2021-10-08 (×4): 150 mg via ORAL
  Filled 2021-10-06 (×4): qty 2

## 2021-10-06 MED ORDER — DOCUSATE SODIUM 100 MG PO CAPS
100.0000 mg | ORAL_CAPSULE | Freq: Two times a day (BID) | ORAL | Status: DC
  Administered 2021-10-06 – 2021-10-08 (×4): 100 mg via ORAL
  Filled 2021-10-06 (×4): qty 1

## 2021-10-06 MED ORDER — SODIUM CHLORIDE (PF) 0.9 % IJ SOLN
INTRAMUSCULAR | Status: DC | PRN
  Administered 2021-10-06: 60 mL

## 2021-10-06 MED ORDER — BUPIVACAINE IN DEXTROSE 0.75-8.25 % IT SOLN
INTRATHECAL | Status: DC | PRN
  Administered 2021-10-06: 1.6 mL via INTRATHECAL

## 2021-10-06 MED ORDER — SODIUM CHLORIDE (PF) 0.9 % IJ SOLN
INTRAMUSCULAR | Status: AC
  Filled 2021-10-06: qty 50

## 2021-10-06 MED ORDER — FENTANYL CITRATE PF 50 MCG/ML IJ SOSY
50.0000 ug | PREFILLED_SYRINGE | INTRAMUSCULAR | Status: DC
  Administered 2021-10-06: 50 ug via INTRAVENOUS
  Filled 2021-10-06: qty 2

## 2021-10-06 MED ORDER — ROPIVACAINE HCL 5 MG/ML IJ SOLN
INTRAMUSCULAR | Status: DC | PRN
  Administered 2021-10-06: 20 mL via PERINEURAL

## 2021-10-06 MED ORDER — PHENYLEPHRINE HCL-NACL 20-0.9 MG/250ML-% IV SOLN
INTRAVENOUS | Status: DC | PRN
  Administered 2021-10-06: 20 ug/min via INTRAVENOUS

## 2021-10-06 MED ORDER — CEFAZOLIN SODIUM-DEXTROSE 2-4 GM/100ML-% IV SOLN
2.0000 g | Freq: Four times a day (QID) | INTRAVENOUS | Status: AC
  Administered 2021-10-06 – 2021-10-07 (×2): 2 g via INTRAVENOUS
  Filled 2021-10-06: qty 100

## 2021-10-06 MED ORDER — POVIDONE-IODINE 10 % EX SWAB
2.0000 | Freq: Once | CUTANEOUS | Status: AC
  Administered 2021-10-06: 2 via TOPICAL

## 2021-10-06 MED ORDER — IPRATROPIUM BROMIDE 0.06 % NA SOLN
1.0000 | Freq: Two times a day (BID) | NASAL | Status: DC | PRN
  Filled 2021-10-06: qty 15

## 2021-10-06 MED ORDER — METOCLOPRAMIDE HCL 5 MG/ML IJ SOLN: 5.0000 mg | Freq: Three times a day (TID) | INTRAMUSCULAR | Status: DC | PRN

## 2021-10-06 MED ORDER — METOCLOPRAMIDE HCL 5 MG PO TABS: 5.0000 mg | ORAL_TABLET | Freq: Three times a day (TID) | ORAL | Status: DC | PRN

## 2021-10-06 MED ORDER — MIDAZOLAM HCL 2 MG/2ML IJ SOLN
1.0000 mg | INTRAMUSCULAR | Status: DC
  Filled 2021-10-06: qty 2

## 2021-10-06 MED ORDER — BUPIVACAINE LIPOSOME 1.3 % IJ SUSP
INTRAMUSCULAR | Status: AC
  Filled 2021-10-06: qty 20

## 2021-10-06 MED ORDER — PROPOFOL 10 MG/ML IV BOLUS
INTRAVENOUS | Status: DC | PRN
  Administered 2021-10-06: 20 mg via INTRAVENOUS

## 2021-10-06 MED ORDER — PANTOPRAZOLE SODIUM 40 MG PO TBEC
40.0000 mg | DELAYED_RELEASE_TABLET | Freq: Every day | ORAL | Status: DC
  Administered 2021-10-07 – 2021-10-08 (×2): 40 mg via ORAL
  Filled 2021-10-06 (×2): qty 1

## 2021-10-06 MED ORDER — DIPHENHYDRAMINE HCL 12.5 MG/5ML PO ELIX: 12.5000 mg | ORAL_SOLUTION | ORAL | Status: DC | PRN

## 2021-10-06 MED ORDER — SODIUM CHLORIDE (PF) 0.9 % IJ SOLN
INTRAMUSCULAR | Status: AC
  Filled 2021-10-06: qty 10

## 2021-10-06 MED ORDER — TRANEXAMIC ACID-NACL 1000-0.7 MG/100ML-% IV SOLN
1000.0000 mg | INTRAVENOUS | Status: AC
  Administered 2021-10-06: 1000 mg via INTRAVENOUS
  Filled 2021-10-06: qty 100

## 2021-10-06 MED ORDER — ACETAMINOPHEN 10 MG/ML IV SOLN
1000.0000 mg | Freq: Four times a day (QID) | INTRAVENOUS | Status: DC
  Administered 2021-10-06: 1000 mg via INTRAVENOUS
  Filled 2021-10-06: qty 100

## 2021-10-06 MED ORDER — BUSPIRONE HCL 10 MG PO TABS
10.0000 mg | ORAL_TABLET | Freq: Every day | ORAL | Status: DC
  Administered 2021-10-07 – 2021-10-08 (×2): 10 mg via ORAL
  Filled 2021-10-06 (×2): qty 1

## 2021-10-06 MED ORDER — DULOXETINE HCL 60 MG PO CPEP
60.0000 mg | ORAL_CAPSULE | Freq: Every day | ORAL | Status: DC
  Administered 2021-10-07 – 2021-10-08 (×2): 60 mg via ORAL
  Filled 2021-10-06 (×2): qty 1

## 2021-10-06 MED ORDER — LACTATED RINGERS IV SOLN
INTRAVENOUS | Status: DC
Start: 2021-10-06 — End: 2021-10-06

## 2021-10-06 MED ORDER — PHENOL 1.4 % MT LIQD
1.0000 | OROMUCOSAL | Status: DC | PRN
Start: 2021-10-06 — End: 2021-10-08

## 2021-10-06 MED ORDER — ONDANSETRON HCL 4 MG PO TABS: 4.0000 mg | ORAL_TABLET | Freq: Four times a day (QID) | ORAL | Status: DC | PRN

## 2021-10-06 MED ORDER — DEXAMETHASONE SODIUM PHOSPHATE 10 MG/ML IJ SOLN
10.0000 mg | Freq: Once | INTRAMUSCULAR | Status: AC
  Administered 2021-10-07: 10 mg via INTRAVENOUS
  Filled 2021-10-06: qty 1

## 2021-10-06 MED ORDER — OXYCODONE HCL 5 MG PO TABS
10.0000 mg | ORAL_TABLET | ORAL | Status: DC | PRN
  Administered 2021-10-06 – 2021-10-07 (×2): 10 mg via ORAL
  Filled 2021-10-06 (×2): qty 2

## 2021-10-06 MED ORDER — POLYETHYL GLYCOL-PROPYL GLYCOL 0.4-0.3 % OP GEL: Freq: Two times a day (BID) | OPHTHALMIC | Status: DC | PRN

## 2021-10-06 MED ORDER — FENTANYL CITRATE PF 50 MCG/ML IJ SOSY: 50.0000 ug | PREFILLED_SYRINGE | INTRAMUSCULAR | Status: DC

## 2021-10-06 MED ORDER — CHLORHEXIDINE GLUCONATE 0.12 % MT SOLN
15.0000 mL | Freq: Once | OROMUCOSAL | Status: AC
  Administered 2021-10-06: 15 mL via OROMUCOSAL

## 2021-10-06 MED ORDER — ONDANSETRON HCL 4 MG/2ML IJ SOLN
INTRAMUSCULAR | Status: DC | PRN
  Administered 2021-10-06: 4 mg via INTRAVENOUS

## 2021-10-06 MED ORDER — CYPROHEPTADINE HCL 4 MG PO TABS
4.0000 mg | ORAL_TABLET | Freq: Two times a day (BID) | ORAL | Status: DC
  Administered 2021-10-07 – 2021-10-08 (×3): 4 mg via ORAL
  Filled 2021-10-06 (×3): qty 1

## 2021-10-06 MED ORDER — DEXAMETHASONE SODIUM PHOSPHATE 10 MG/ML IJ SOLN
8.0000 mg | Freq: Once | INTRAMUSCULAR | Status: AC
  Administered 2021-10-06: 8 mg via INTRAVENOUS

## 2021-10-06 MED ORDER — ROSUVASTATIN CALCIUM 20 MG PO TABS
20.0000 mg | ORAL_TABLET | Freq: Every day | ORAL | Status: DC
  Administered 2021-10-07 – 2021-10-08 (×2): 20 mg via ORAL
  Filled 2021-10-06 (×2): qty 1

## 2021-10-06 MED ORDER — OXYCODONE HCL 5 MG PO TABS
5.0000 mg | ORAL_TABLET | ORAL | Status: DC | PRN
  Administered 2021-10-06 (×2): 5 mg via ORAL
  Filled 2021-10-06 (×2): qty 1

## 2021-10-06 MED ORDER — BUPROPION HCL ER (XL) 150 MG PO TB24
150.0000 mg | ORAL_TABLET | Freq: Every day | ORAL | Status: DC
  Administered 2021-10-07 – 2021-10-08 (×2): 150 mg via ORAL
  Filled 2021-10-06 (×2): qty 1

## 2021-10-06 MED ORDER — RIVAROXABAN 10 MG PO TABS
10.0000 mg | ORAL_TABLET | Freq: Every day | ORAL | Status: DC
  Administered 2021-10-07 – 2021-10-08 (×2): 10 mg via ORAL
  Filled 2021-10-06 (×2): qty 1

## 2021-10-06 MED ORDER — MENTHOL 3 MG MT LOZG
1.0000 | LOZENGE | OROMUCOSAL | Status: DC | PRN
Start: 2021-10-06 — End: 2021-10-08

## 2021-10-06 MED ORDER — SODIUM CHLORIDE 0.9 % IR SOLN
Status: DC | PRN
  Administered 2021-10-06 (×2): 1000 mL

## 2021-10-06 MED ORDER — LORAZEPAM 1 MG PO TABS
2.0000 mg | ORAL_TABLET | Freq: Every day | ORAL | Status: DC
  Administered 2021-10-06 – 2021-10-07 (×2): 2 mg via ORAL
  Filled 2021-10-06 (×2): qty 2

## 2021-10-06 MED ORDER — FLEET ENEMA 7-19 GM/118ML RE ENEM: 1.0000 | ENEMA | Freq: Once | RECTAL | Status: DC | PRN

## 2021-10-06 MED ORDER — BUPIVACAINE LIPOSOME 1.3 % IJ SUSP: 20.0000 mL | Freq: Once | INTRAMUSCULAR | Status: DC

## 2021-10-06 MED ORDER — LEVOTHYROXINE SODIUM 125 MCG PO TABS
125.0000 ug | ORAL_TABLET | Freq: Every day | ORAL | Status: DC
  Administered 2021-10-07 – 2021-10-08 (×2): 125 ug via ORAL
  Filled 2021-10-06 (×2): qty 1

## 2021-10-06 MED ORDER — BUPIVACAINE LIPOSOME 1.3 % IJ SUSP
INTRAMUSCULAR | Status: DC | PRN
  Administered 2021-10-06: 20 mL

## 2021-10-06 SURGICAL SUPPLY — 62 items
ARTISURF 10M VE L 6-9 CD KNEE (Knees) ×1 IMPLANT
AUG FEM CMT STD PS 7 LT (Joint) ×2 IMPLANT
AUGMENT FEM CMT STD PS 7 LT (Joint) IMPLANT
BAG COUNTER SPONGE SURGICOUNT (BAG) ×1 IMPLANT
BAG ZIPLOCK 12X15 (MISCELLANEOUS) ×2 IMPLANT
BLADE SAG 18X100X1.27 (BLADE) ×2 IMPLANT
BLADE SAW SGTL 11.0X1.19X90.0M (BLADE) ×2 IMPLANT
BNDG ELASTIC 6X5.8 VLCR STR LF (GAUZE/BANDAGES/DRESSINGS) ×2 IMPLANT
BOWL SMART MIX CTS (DISPOSABLE) ×2 IMPLANT
CEMENT HV SMART SET (Cement) ×4 IMPLANT
CLSR STERI-STRIP ANTIMIC 1/2X4 (GAUZE/BANDAGES/DRESSINGS) ×1 IMPLANT
COMP PATELLAR 35 STD 9 THK (Orthopedic Implant) IMPLANT
COVER SURGICAL LIGHT HANDLE (MISCELLANEOUS) ×2 IMPLANT
CUFF TOURN SGL QUICK 34 (TOURNIQUET CUFF) ×2
CUFF TRNQT CYL 34X4.125X (TOURNIQUET CUFF) ×1 IMPLANT
DRAPE INCISE IOBAN 66X45 STRL (DRAPES) ×2 IMPLANT
DRAPE U-SHAPE 47X51 STRL (DRAPES) ×2 IMPLANT
DRSG AQUACEL AG ADV 3.5X10 (GAUZE/BANDAGES/DRESSINGS) ×2 IMPLANT
DURAPREP 26ML APPLICATOR (WOUND CARE) ×2 IMPLANT
ELECT REM PT RETURN 15FT ADLT (MISCELLANEOUS) ×2 IMPLANT
GLOVE BIO SURGEON STRL SZ 6.5 (GLOVE) ×4 IMPLANT
GLOVE BIO SURGEON STRL SZ7.5 (GLOVE) ×1 IMPLANT
GLOVE BIO SURGEON STRL SZ8 (GLOVE) ×2 IMPLANT
GLOVE BIOGEL PI IND STRL 6.5 (GLOVE) IMPLANT
GLOVE BIOGEL PI IND STRL 7.0 (GLOVE) IMPLANT
GLOVE BIOGEL PI IND STRL 8 (GLOVE) ×1 IMPLANT
GLOVE BIOGEL PI INDICATOR 6.5 (GLOVE) ×1
GLOVE BIOGEL PI INDICATOR 7.0 (GLOVE) ×2
GLOVE BIOGEL PI INDICATOR 8 (GLOVE) ×1
GOWN STRL REUS W/ TWL LRG LVL3 (GOWN DISPOSABLE) ×1 IMPLANT
GOWN STRL REUS W/ TWL XL LVL3 (GOWN DISPOSABLE) IMPLANT
GOWN STRL REUS W/TWL LRG LVL3 (GOWN DISPOSABLE) ×2
GOWN STRL REUS W/TWL XL LVL3 (GOWN DISPOSABLE) ×6
HANDPIECE INTERPULSE COAX TIP (DISPOSABLE) ×2
HDLS TROCR DRIL PIN KNEE 75 (PIN) ×2
HOLDER FOLEY CATH W/STRAP (MISCELLANEOUS) ×1 IMPLANT
IMMOBILIZER KNEE 20 (SOFTGOODS) ×2
IMMOBILIZER KNEE 20 THIGH 36 (SOFTGOODS) ×1 IMPLANT
KIT TURNOVER KIT A (KITS) ×1 IMPLANT
MANIFOLD NEPTUNE II (INSTRUMENTS) ×2 IMPLANT
NS IRRIG 1000ML POUR BTL (IV SOLUTION) ×2 IMPLANT
PACK TOTAL KNEE CUSTOM (KITS) ×2 IMPLANT
PADDING CAST COTTON 6X4 STRL (CAST SUPPLIES) ×3 IMPLANT
PATELLA ZIMMER 35MM (Orthopedic Implant) ×2 IMPLANT
PIN DRILL HDLS TROCAR 75 4PK (PIN) IMPLANT
PROTECTOR NERVE ULNAR (MISCELLANEOUS) ×2 IMPLANT
SCREW FEMALE HEX FIX 25X2.5 (ORTHOPEDIC DISPOSABLE SUPPLIES) ×1 IMPLANT
SET HNDPC FAN SPRY TIP SCT (DISPOSABLE) ×1 IMPLANT
SLEEVE SURGEON STRL (DRAPES) ×1 IMPLANT
SPIKE FLUID TRANSFER (MISCELLANEOUS) ×2 IMPLANT
STEM TIBIA 5 DEG SZ D L KNEE (Knees) IMPLANT
STRIP CLOSURE SKIN 1/2X4 (GAUZE/BANDAGES/DRESSINGS) ×4 IMPLANT
SUT MNCRL AB 4-0 PS2 18 (SUTURE) ×2 IMPLANT
SUT STRATAFIX 0 PDS 27 VIOLET (SUTURE) ×2
SUT VIC AB 2-0 CT1 27 (SUTURE) ×6
SUT VIC AB 2-0 CT1 TAPERPNT 27 (SUTURE) ×3 IMPLANT
SUTURE STRATFX 0 PDS 27 VIOLET (SUTURE) ×1 IMPLANT
TIBIA STEM 5 DEG SZ D L KNEE (Knees) ×2 IMPLANT
TRAY FOLEY MTR SLVR 16FR STAT (SET/KITS/TRAYS/PACK) ×2 IMPLANT
TUBE SUCTION HIGH CAP CLEAR NV (SUCTIONS) ×2 IMPLANT
WATER STERILE IRR 1000ML POUR (IV SOLUTION) ×4 IMPLANT
WRAP KNEE MAXI GEL POST OP (GAUZE/BANDAGES/DRESSINGS) ×2 IMPLANT

## 2021-10-06 NOTE — Interval H&P Note (Signed)
History and Physical Interval Note:  10/06/2021 10:38 AM  [name]  has presented today for surgery, with the diagnosis of left knee osteoarthritis.  The various methods of treatment have been discussed with the patient and family. After consideration of risks, benefits and other options for treatment, the patient has consented to  Procedure(s): TOTAL KNEE ARTHROPLASTY (Left) as a surgical intervention.  The patient's history has been reviewed, patient examined, no change in status, stable for surgery.  I have reviewed the patient's chart and labs.  Questions were answered to the patient's satisfaction.     Pilar Plate Lilyian Quayle

## 2021-10-06 NOTE — Transfer of Care (Signed)
Immediate Anesthesia Transfer of Care Note  Patient: [name]  Procedure(s) Performed: TOTAL KNEE ARTHROPLASTY (Left: Knee)  Patient Location: PACU  Anesthesia Type:Spinal  Level of Consciousness: awake  Airway & Oxygen Therapy: Patient Spontanous Breathing and Patient connected to face mask oxygen  Post-op Assessment: Report given to RN and Post -op Vital signs reviewed and stable  Post vital signs: Reviewed and stable  Last Vitals:  Vitals Value Taken Time  BP    Temp    Pulse    Resp    SpO2      Last Pain:  Vitals:   10/06/21 1210  TempSrc:   PainSc: 0-No pain      Patients Stated Pain Goal: 4 (32/35/57 3220)  Complications: No notable events documented.

## 2021-10-06 NOTE — Care Plan (Signed)
Ortho Bundle Case Management Note  Patient Details  Name: [name] MRN: [id number] Date of Birth: [birth year]  L TKA on 10-06-21 DCP:  Home with husband DME:  No needs, has a RW PT:  [facility] on 10-09-21                   DME Arranged:  N/A DME Agency:  NA  HH Arranged:  NA HH Agency:  NA  Additional Comments: Please contact me with any questions of if this plan should need to change.  Marianne Sofia, RN,CCM [facility]  [phone] 10/06/2021, 1:24 PM

## 2021-10-06 NOTE — Anesthesia Procedure Notes (Signed)
Anesthesia Regional Block: Adductor canal block   Pre-Anesthetic Checklist: , timeout performed,  Correct Patient, Correct Site, Correct Laterality,  Correct Procedure, Correct Position, site marked,  Risks and benefits discussed,  Surgical consent,  Pre-op evaluation,  At surgeon's request and post-op pain management  Laterality: Left  Prep: chloraprep       Needles:  Injection technique: Single-shot  Needle Type: Echogenic Needle     Needle Length: 9cm  Needle Gauge: 21     Additional Needles:   Procedures:,,,, ultrasound used (permanent image in chart),,    Narrative:  Start time: 10/06/2021 12:07 PM End time: 10/06/2021 12:12 PM Injection made incrementally with aspirations every 5 mL.  Performed by: Personally  Anesthesiologist: Suzette Battiest, MD

## 2021-10-06 NOTE — Anesthesia Postprocedure Evaluation (Signed)
Anesthesia Post Note  Patient: [name]  Procedure(s) Performed: TOTAL KNEE ARTHROPLASTY (Left: Knee)     Patient location during evaluation: PACU Anesthesia Type: Spinal Level of consciousness: awake and alert Pain management: pain level controlled Vital Signs Assessment: post-procedure vital signs reviewed and stable Respiratory status: spontaneous breathing and respiratory function stable Cardiovascular status: blood pressure returned to baseline and stable Postop Assessment: spinal receding Anesthetic complications: no   No notable events documented.  Last Vitals:  Vitals:   10/06/21 1609 10/06/21 1801  BP: 136/79 110/61  Pulse: 67 86  Resp: 16 18  Temp: 36.7 C 36.6 C  SpO2: 96% 97%    Last Pain:  Vitals:   10/06/21 1922  TempSrc:   PainSc: 6                  Tiajuana Amass

## 2021-10-06 NOTE — Anesthesia Preprocedure Evaluation (Signed)
Anesthesia Evaluation  Patient identified by MRN, date of birth, ID band Patient awake    Reviewed: Allergy & Precautions, NPO status , Patient's Chart, lab work & pertinent test results  Airway Mallampati: II  TM Distance: >3 FB Neck ROM: Full    Dental  (+) Dental Advisory Given   Pulmonary sleep apnea ,    breath sounds clear to auscultation       Cardiovascular negative cardio ROS   Rhythm:Regular Rate:Normal     Neuro/Psych  Headaches,  Neuromuscular disease    GI/Hepatic Neg liver ROS, GERD  ,  Endo/Other  Hypothyroidism   Renal/GU negative Renal ROS     Musculoskeletal  (+) Arthritis , Fibromyalgia -  Abdominal   Peds  Hematology negative hematology ROS (+)   Anesthesia Other Findings   Reproductive/Obstetrics                             Lab Results  Component Value Date   WBC 6.2 09/16/2015   HGB 13.9 09/16/2015   HCT 40.8 09/16/2015   MCV 101.5 (H) 09/16/2015   PLT 204 09/16/2015   Lab Results  Component Value Date   CREATININE 0.89 01/03/2019   BUN 12 01/03/2019   NA 138 01/03/2019   K 4.7 01/03/2019   CL 101 01/03/2019   CO2 25 01/03/2019    Anesthesia Physical Anesthesia Plan  ASA: 2  Anesthesia Plan: Spinal   Post-op Pain Management: Regional block* and Ofirmev IV (intra-op)*   Induction:   PONV Risk Score and Plan: 2 and Dexamethasone, Propofol infusion, Ondansetron and Treatment may vary due to age or medical condition  Airway Management Planned: Natural Airway and Simple Face Mask  Additional Equipment:   Intra-op Plan:   Post-operative Plan:   Informed Consent: I have reviewed the patients History and Physical, chart, labs and discussed the procedure including the risks, benefits and alternatives for the proposed anesthesia with the patient or authorized representative who has indicated his/her understanding and acceptance.       Plan  Discussed with: CRNA  Anesthesia Plan Comments:         Anesthesia Quick Evaluation

## 2021-10-06 NOTE — Anesthesia Procedure Notes (Signed)
Spinal  Patient location during procedure: OR Start time: 10/06/2021 12:44 PM End time: 10/06/2021 12:50 PM Reason for block: surgical anesthesia Staffing Performed: resident/CRNA  Resident/CRNA: Milford Cage, CRNA Performed by: Milford Cage, CRNA Authorized by: Suzette Battiest, MD   Preanesthetic Checklist Completed: patient identified, IV checked, site marked, risks and benefits discussed, surgical consent, monitors and equipment checked, pre-op evaluation and timeout performed Spinal Block Patient position: sitting Prep: DuraPrep and site prepped and draped Patient monitoring: heart rate, cardiac monitor, continuous pulse ox and blood pressure Approach: midline Location: L3-4 Injection technique: single-shot Needle Needle type: Pencan  Needle gauge: 24 G Needle length: 10 cm Assessment Sensory level: T6 Events: CSF return Additional Notes Functioning IV was confirmed and monitors were applied. Sterile prep and drape, including hand hygiene and sterile gloves were used. The patient was positioned and the spine was prepped. The skin was anesthetized with lidocaine.  Free flow of clear CSF was obtained prior to injecting local anesthetic into the CSF.  The spinal needle aspirated freely following injection.  The needle was carefully withdrawn.  The patient tolerated the procedure well.

## 2021-10-06 NOTE — Evaluation (Signed)
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 PMH: anxiety, fibromyalgia, GERD, IBS, cervical fusion, R-TKA 2015.   Clinical Impression  [name] is a 71 y.o. female POD 0 s/p L-TKA. Patient reports independence with mobility at baseline. Patient is now limited by functional impairments (see PT problem list below) and requires min guard for bed mobility and min guard for transfers. Patient was able to ambulate 10 feet with RW and min guard level of assist. Patient instructed in exercise to facilitate ROM and circulation to manage edema. Provided incentive spirometer and with Vcs pt able to achieve 1573m. Patient will benefit from continued skilled PT interventions to address impairments and progress towards PLOF. Acute PT will follow to progress mobility and stair training in preparation for safe discharge home.       Recommendations for follow up therapy are one component of a multi-disciplinary discharge planning process, led by the attending physician.  Recommendations may be updated based on patient status, additional functional criteria and insurance authorization.  Follow Up Recommendations Follow physician's recommendations for discharge plan and follow up therapies      Assistance Recommended at Discharge Set up Supervision/Assistance  Patient can return home with the following  A little help with walking and/or transfers;A little help with bathing/dressing/bathroom;Assistance with cooking/housework;Assist for transportation;Help with stairs or ramp for entrance    Equipment Recommendations None recommended by PT  Recommendations for Other Services       Functional Status Assessment Patient has had a recent decline in their functional status and demonstrates the ability to make significant improvements in function in a  reasonable and predictable amount of time.     Precautions / Restrictions Precautions Precautions: Fall Required Braces or Orthoses: Knee Immobilizer - Left Knee Immobilizer - Left: On when out of bed or walking Restrictions Weight Bearing Restrictions: No LLE Weight Bearing: Weight bearing as tolerated      Mobility  Bed Mobility Overal bed mobility: Needs Assistance Bed Mobility: Supine to Sit     Supine to sit: Min guard     General bed mobility comments: For safety only, no physical assist required    Transfers Overall transfer level: Needs assistance Equipment used: Rolling walker (2 wheels) Transfers: Sit to/from Stand Sit to Stand: Min guard, From elevated surface           General transfer comment: Min guard from elevated surface, no physical assist required.    Ambulation/Gait Ambulation/Gait assistance: Min guard, +2 safety/equipment Gait Distance (Feet): 10 Feet Assistive device: Rolling walker (2 wheels) Gait Pattern/deviations: Step-to pattern Gait velocity: decreased     General Gait Details: Pt ambulated with RW and min guard +2 for recliner follow for safety. Pt had minor LOB to the L at end of ambulation task where pt's L knee buckled and required min assist from PT to bring pt into recliner surface safely, encouraged use of L-KI when OOB or attempting stairs moving forward.  Stairs            Wheelchair Mobility    Modified Rankin (Stroke Patients Only)       Balance Overall balance assessment: Needs assistance Sitting-balance support: Feet supported, No upper extremity supported Sitting balance-Leahy Scale: Good     Standing balance support: Reliant on assistive device for balance, During functional activity, Bilateral upper extremity supported Standing balance-Leahy Scale: Poor  Pertinent Vitals/Pain Pain Assessment Pain Assessment: No/denies pain    Home Living Family/patient  expects to be discharged to:: Private residence Living Arrangements: Spouse/significant other Available Help at Discharge: Family;Available 24 hours/day Type of Home: House Home Access: Level entry     Alternate Level Stairs-Number of Steps: 18 Home Layout: Two level;Bed/bath upstairs Home Equipment: Conservation officer, nature (2 wheels);BSC/3in1      Prior Function Prior Level of Function : Independent/Modified Independent;Driving;History of Falls (last six months)             Mobility Comments: IND ADLs Comments: IND     Hand Dominance        Extremity/Trunk Assessment   Upper Extremity Assessment Upper Extremity Assessment: Overall WFL for tasks assessed    Lower Extremity Assessment Lower Extremity Assessment: RLE deficits/detail;LLE deficits/detail RLE Deficits / Details: MMT ank DF/PF 5/5 RLE Sensation: WNL LLE Deficits / Details: MMT ank DF/PF 5/5, no extensor lag ntoed LLE Sensation: WNL    Cervical / Trunk Assessment Cervical / Trunk Assessment: Neck Surgery  Communication   Communication: No difficulties  Cognition Arousal/Alertness: Awake/alert Behavior During Therapy: WFL for tasks assessed/performed Overall Cognitive Status: Within Functional Limits for tasks assessed                                          General Comments General comments (skin integrity, edema, etc.): Husband [name] present    Exercises Total Joint Exercises Ankle Circles/Pumps: AROM, Both, 10 reps   Assessment/Plan    PT Assessment Patient needs continued PT services  PT Problem List Decreased strength;Decreased range of motion;Decreased activity tolerance;Decreased balance;Decreased mobility;Decreased coordination;Pain       PT Treatment Interventions Gait training;DME instruction;Stair training;Functional mobility training;Therapeutic activities;Therapeutic exercise;Balance training;Neuromuscular re-education;Patient/family education    PT Goals (Current goals  can be found in the Care Plan section)  Acute Rehab PT Goals Patient Stated Goal: Walking the dog PT Goal Formulation: With patient Time For Goal Achievement: 10/13/21 Potential to Achieve Goals: Good    Frequency 7X/week     Co-evaluation               AM-PAC PT "6 Clicks" Mobility  Outcome Measure Help needed turning from your back to your side while in a flat bed without using bedrails?: None Help needed moving from lying on your back to sitting on the side of a flat bed without using bedrails?: None Help needed moving to and from a bed to a chair (including a wheelchair)?: A Little Help needed standing up from a chair using your arms (e.g., wheelchair or bedside chair)?: A Little Help needed to walk in hospital room?: A Little Help needed climbing 3-5 steps with a railing? : A Lot 6 Click Score: 19    End of Session Equipment Utilized During Treatment: Gait belt Activity Tolerance: Patient tolerated treatment well;No increased pain Patient left: in chair;with call bell/phone within reach;with chair alarm set;with family/visitor present;with SCD's reapplied Nurse Communication: Mobility status PT Visit Diagnosis: Pain;Difficulty in walking, not elsewhere classified (R26.2) Pain - Right/Left: Left Pain - part of body: Knee    Time: 4193-7902 PT Time Calculation (min) (ACUTE ONLY): 22 min   Charges:   PT Evaluation $PT Eval Low Complexity: Stallings, PT, DPT [facility] Office: [phone] Pager: [phone]  Coolidge Breeze 10/06/2021, 6:57 PM

## 2021-10-06 NOTE — Discharge Instructions (Signed)
 Frank Aluisio, MD Total Joint Specialist [facility] [place] [address] , [place] [address] [phone]  TOTAL KNEE REPLACEMENT POSTOPERATIVE DIRECTIONS    Knee Rehabilitation, Guidelines Following Surgery  Results after knee surgery are often greatly improved when you follow the exercise, range of motion and muscle strengthening exercises prescribed by your doctor. Safety measures are also important to protect the knee from further injury. If any of these exercises cause you to have increased pain or swelling in your knee joint, decrease the amount until you are comfortable again and slowly increase them. If you have problems or questions, call your caregiver or physical therapist for advice.   BLOOD CLOT PREVENTION Take a 10 mg Xarelto once a day for three weeks following surgery. Then resume an 81 mg Aspirin once a day You may resume your vitamins/supplements once you have discontinued the Xarelto. Do not take any NSAIDs (Advil, Aleve, Ibuprofen, Meloxicam, etc.) until you have discontinued the Xarelto.   HOME CARE INSTRUCTIONS  Remove items at home which could result in a fall. This includes throw rugs or furniture in walking pathways.  ICE to the affected knee as much as tolerated. Icing helps control swelling. If the swelling is well controlled you will be more comfortable and rehab easier. Continue to use ice on the knee for pain and swelling from surgery. You may notice swelling that will progress down to the foot and ankle. This is normal after surgery. Elevate the leg when you are not up walking on it.    Continue to use the breathing machine which will help keep your temperature down. It is common for your temperature to cycle up and down following surgery, especially at night when you are not up moving around and exerting yourself. The breathing machine keeps your lungs expanded and your temperature down. Do not place pillow under the operative  knee, focus on keeping the knee straight while resting  DIET You may resume your previous home diet once you are discharged from the hospital.  DRESSING / WOUND CARE / SHOWERING Keep your bulky bandage on for 2 days. On the third post-operative day you may remove the Ace bandage and gauze. There is a waterproof adhesive bandage on your skin which will stay in place until your first follow-up appointment. Once you remove this you will not need to place another bandage You may begin showering 3 days following surgery, but do not submerge the incision under water.  ACTIVITY For the first 5 days, the key is rest and control of pain and swelling Do your home exercises twice a day starting on post-operative day 3. On the days you go to physical therapy, just do the home exercises once that day. You should rest, ice and elevate the leg for 50 minutes out of every hour. Get up and walk/stretch for 10 minutes per hour. After 5 days you can increase your activity slowly as tolerated. Walk with your walker as instructed. Use the walker until you are comfortable transitioning to a cane. Walk with the cane in the opposite hand of the operative leg. You may discontinue the cane once you are comfortable and walking steadily. Avoid periods of inactivity such as sitting longer than an hour when not asleep. This helps prevent blood clots.  You may discontinue the knee immobilizer once you are able to perform a straight leg raise while lying down. You may resume a sexual relationship in one month or when given the OK by your   doctor.  You may return to work once you are cleared by your doctor.  Do not drive a car for 6 weeks or until released by your surgeon.  Do not drive while taking narcotics.  TED HOSE STOCKINGS Wear the elastic stockings on both legs for three weeks following surgery during the day. You may remove them at night for sleeping.  WEIGHT BEARING Weight bearing as tolerated with assist device  (walker, cane, etc) as directed, use it as long as suggested by your surgeon or therapist, typically at least 4-6 weeks.  POSTOPERATIVE CONSTIPATION PROTOCOL Constipation - defined medically as fewer than three stools per week and severe constipation as less than one stool per week.  One of the most common issues patients have following surgery is constipation.  Even if you have a regular bowel pattern at home, your normal regimen is likely to be disrupted due to multiple reasons following surgery.  Combination of anesthesia, postoperative narcotics, change in appetite and fluid intake all can affect your bowels.  In order to avoid complications following surgery, here are some recommendations in order to help you during your recovery period.  Colace (docusate) - Pick up an over-the-counter form of Colace or another stool softener and take twice a day as long as you are requiring postoperative pain medications.  Take with a full glass of water daily.  If you experience loose stools or diarrhea, hold the colace until you stool forms back up. If your symptoms do not get better within 1 week or if they get worse, check with your doctor. Dulcolax (bisacodyl) - Pick up over-the-counter and take as directed by the product packaging as needed to assist with the movement of your bowels.  Take with a full glass of water.  Use this product as needed if not relieved by Colace only.  MiraLax (polyethylene glycol) - Pick up over-the-counter to have on hand. MiraLax is a solution that will increase the amount of water in your bowels to assist with bowel movements.  Take as directed and can mix with a glass of water, juice, soda, coffee, or tea. Take if you go more than two days without a movement. Do not use MiraLax more than once per day. Call your doctor if you are still constipated or irregular after using this medication for 7 days in a row.  If you continue to have problems with postoperative constipation, please  contact the office for further assistance and recommendations.  If you experience "the worst abdominal pain ever" or develop nausea or vomiting, please contact the office immediatly for further recommendations for treatment.  ITCHING If you experience itching with your medications, try taking only a single pain pill, or even half a pain pill at a time.  You can also use Benadryl over the counter for itching or also to help with sleep.   MEDICATIONS See your medication summary on the "After Visit Summary" that the nursing staff will review with you prior to discharge.  You may have some home medications which will be placed on hold until you complete the course of blood thinner medication.  It is important for you to complete the blood thinner medication as prescribed by your surgeon.  Continue your approved medications as instructed at time of discharge.  PRECAUTIONS If you experience chest pain or shortness of breath - call 911 immediately for transfer to the hospital emergency department.  If you develop a fever greater that 101 F, purulent drainage from wound, increased redness or   drainage from wound, foul odor from the wound/dressing, or calf pain - CONTACT YOUR SURGEON.                                                   FOLLOW-UP APPOINTMENTS Make sure you keep all of your appointments after your operation with your surgeon and caregivers. You should call the office at the above phone number and make an appointment for approximately two weeks after the date of your surgery or on the date instructed by your surgeon outlined in the "After Visit Summary".  RANGE OF MOTION AND STRENGTHENING EXERCISES  Rehabilitation of the knee is important following a knee injury or an operation. After just a few days of immobilization, the muscles of the thigh which control the knee become weakened and shrink (atrophy). Knee exercises are designed to build up the tone and strength of the thigh muscles and to  improve knee motion. Often times heat used for twenty to thirty minutes before working out will loosen up your tissues and help with improving the range of motion but do not use heat for the first two weeks following surgery. These exercises can be done on a training (exercise) mat, on the floor, on a table or on a bed. Use what ever works the best and is most comfortable for you Knee exercises include:  Leg Lifts - While your knee is still immobilized in a splint or cast, you can do straight leg raises. Lift the leg to 60 degrees, hold for 3 sec, and slowly lower the leg. Repeat 10-20 times 2-3 times daily. Perform this exercise against resistance later as your knee gets better.  Quad and Hamstring Sets - Tighten up the muscle on the front of the thigh (Quad) and hold for 5-10 sec. Repeat this 10-20 times hourly. Hamstring sets are done by pushing the foot backward against an object and holding for 5-10 sec. Repeat as with quad sets.  Leg Slides: Lying on your back, slowly slide your foot toward your buttocks, bending your knee up off the floor (only go as far as is comfortable). Then slowly slide your foot back down until your leg is flat on the floor again. Angel Wings: Lying on your back spread your legs to the side as far apart as you can without causing discomfort.  A rehabilitation program following serious knee injuries can speed recovery and prevent re-injury in the future due to weakened muscles. Contact your doctor or a physical therapist for more information on knee rehabilitation.   POST-OPERATIVE OPIOID TAPER INSTRUCTIONS: It is important to wean off of your opioid medication as soon as possible. If you do not need pain medication after your surgery it is ok to stop day one. Opioids include: Codeine, Hydrocodone(Norco, Vicodin), Oxycodone(Percocet, oxycontin) and hydromorphone amongst others.  Long term and even short term use of opiods can cause: Increased pain  response Dependence Constipation Depression Respiratory depression And more.  Withdrawal symptoms can include Flu like symptoms Nausea, vomiting And more Techniques to manage these symptoms Hydrate well Eat regular healthy meals Stay active Use relaxation techniques(deep breathing, meditating, yoga) Do Not substitute Alcohol to help with tapering If you have been on opioids for less than two weeks and do not have pain than it is ok to stop all together.  Plan to wean off of opioids This plan   should start within one week post op of your joint replacement. Maintain the same interval or time between taking each dose and first decrease the dose.  Cut the total daily intake of opioids by one tablet each day Next start to increase the time between doses. The last dose that should be eliminated is the evening dose.   IF YOU ARE TRANSFERRED TO A SKILLED REHAB FACILITY If the patient is transferred to a skilled rehab facility following release from the hospital, a list of the current medications will be sent to the facility for the patient to continue.  When discharged from the skilled rehab facility, please have the facility set up the patient's Home Health Physical Therapy prior to being released. Also, the skilled facility will be responsible for providing the patient with their medications at time of release from the facility to include their pain medication, the muscle relaxants, and their blood thinner medication. If the patient is still at the rehab facility at time of the two week follow up appointment, the skilled rehab facility will also need to assist the patient in arranging follow up appointment in our office and any transportation needs.  MAKE SURE YOU:  Understand these instructions.  Get help right away if you are not doing well or get worse.   DENTAL ANTIBIOTICS:  In most cases prophylactic antibiotics for Dental procdeures after total joint surgery are not  necessary.  Exceptions are as follows:  1. History of prior total joint infection  2. Severely immunocompromised (Organ Transplant, cancer chemotherapy, Rheumatoid biologic medications such as Humera)  3. Poorly controlled diabetes (A1C &gt; 8.0, blood glucose over 200)  If you have one of these conditions, contact your surgeon for an antibiotic prescription, prior to your dental procedure.    Pick up stool softner and laxative for home use following surgery while on pain medications. Do not submerge incision under water. Please use good hand washing techniques while changing dressing each day. May shower starting three days after surgery. Please use a clean towel to pat the incision dry following showers. Continue to use ice for pain and swelling after surgery. Do not use any lotions or creams on the incision until instructed by your surgeon.  Information on my medicine - XARELTO (Rivaroxaban)  This medication education was reviewed with me or my healthcare representative as part of my discharge preparation.  The pharmacist that spoke with me during my hospital stay was:    Why was Xarelto prescribed for you? Xarelto was prescribed for you to reduce the risk of blood clots forming after orthopedic surgery. The medical term for these abnormal blood clots is venous thromboembolism (VTE).  What do you need to know about xarelto ? Take your Xarelto ONCE DAILY at the same time every day. You may take it either with or without food.  If you have difficulty swallowing the tablet whole, you may crush it and mix in applesauce just prior to taking your dose.  Take Xarelto exactly as prescribed by your doctor and DO NOT stop taking Xarelto without talking to the doctor who prescribed the medication.  Stopping without other VTE prevention medication to take the place of Xarelto may increase your risk of developing a clot.  After discharge, you should have regular check-up  appointments with your healthcare provider that is prescribing your Xarelto.    What do you do if you miss a dose? If you miss a dose, take it as soon as you remember on the same   day then continue your regularly scheduled once daily regimen the next day. Do not take two doses of Xarelto on the same day.   Important Safety Information A possible side effect of Xarelto is bleeding. You should call your healthcare provider right away if you experience any of the following: Bleeding from an injury or your nose that does not stop. Unusual colored urine (red or dark brown) or unusual colored stools (red or black). Unusual bruising for unknown reasons. A serious fall or if you hit your head (even if there is no bleeding).  Some medicines may interact with Xarelto and might increase your risk of bleeding while on Xarelto. To help avoid this, consult your healthcare provider or pharmacist prior to using any new prescription or non-prescription medications, including herbals, vitamins, non-steroidal anti-inflammatory drugs (NSAIDs) and supplements.  This website has more information on Xarelto: www.xarelto.com.    

## 2021-10-06 NOTE — Progress Notes (Signed)
Orthopedic Tech Progress Note Patient Details:  [name] 24-Aug-1950 [id number]  Patient ID: [name], female   DOB: 05/22/1950, 71 y.o.   MRN: [id number]  [name] 10/06/2021, 3:11 PM Cpm applied to left leg in pacu

## 2021-10-06 NOTE — Op Note (Signed)
OPERATIVE REPORT-TOTAL KNEE ARTHROPLASTY   Pre-operative diagnosis- Osteoarthritis  Left knee(s)  Post-operative diagnosis- Osteoarthritis Left knee(s)  Procedure-  Left  Total Knee Arthroplasty ([employer]immer Person[employer] titanium)  Surgeon- Dione Plover. Aitana Burry, MD  Assistant- Jaynie Bream, PA-C   Anesthesia-   Adductor canal block and spinal  EBL-50 mL   Drains None  Tourniquet time-  Total Tourniquet Time Documented: Thigh (Left) - 47 minutes Total: Thigh (Left) - 47 minutes     Complications- None  Condition-PACU - hemodynamically stable.   Brief Clinical Note  [name] is a 71 y.o. year old female with end stage OA of her left knee with progressively worsening pain and dysfunction. She has constant pain, with activity and at rest and significant functional deficits with difficulties even with ADLs. She has had extensive non-op management including analgesics, injections of cortisone and viscosupplements, and home exercise program, but remains in significant pain with significant dysfunction. Radiographs show bone on bone arthritis medial and patellofemoral. She presents now for left Total Knee Arthroplasty.     Procedure in detail---   The patient is brought into the operating room and positioned supine on the operating table. After successful administration of  Adductor canal block and spinal,   a tourniquet is placed high on the  Left thigh(s) and the lower extremity is prepped and draped in the usual sterile fashion. Time out is performed by the operating team and then the  Left lower extremity is wrapped in Esmarch, knee flexed and the tourniquet inflated to 300 mmHg.       A midline incision is made with a ten blade through the subcutaneous tissue to the level of the extensor mechanism. A fresh blade is used to make a medial parapatellar arthrotomy. Soft tissue over the proximal medial tibia is subperiosteally elevated to the joint line with a knife and into the  semimembranosus bursa with a Cobb elevator. Soft tissue over the proximal lateral tibia is elevated with attention being paid to avoiding the patellar tendon on the tibial tubercle. The patella is everted, knee flexed 90 degrees and the ACL and PCL are removed. Findings are bone on bone medial and patellofemoral        The drill is used to create a starting hole in the distal femur and the canal is thoroughly irrigated with sterile saline to remove the fatty contents. The 5 degree Left  valgus alignment guide is placed into the femoral canal and the distal femoral cutting block is pinned to remove 10 mm off the distal femur. Resection is made with an oscillating saw.      The tibia is subluxed forward and the menisci are removed. The extramedullary alignment guide is placed referencing proximally at the medial aspect of the tibial tubercle and distally along the second metatarsal axis and tibial crest. The block is pinned to remove 66m off the more deficient medial  side. Resection is made with an oscillating saw. Size D is the most appropriate size for the tibia and the proximal tibia is prepared with the modular drill and keel punch for that size.      The femoral sizing guide is placed and size 7 is most appropriate. Rotation is marked off the epicondylar axis and confirmed by creating a rectangular flexion gap at 90 degrees. The size 7 cutting block is pinned in this rotation and the anterior, posterior and chamfer cuts are made with the oscillating saw. The intercondylar block is then placed and that cut is  made.      Trial size D tibial component, trial size 7 posterior stabilized femur and a 10  mm posterior stabilized fixed bearing insert trial is placed. Full extension is achieved with excellent varus/valgus and anterior/posterior balance throughout full range of motion. The patella is everted and thickness measured to be 22  mm. Free hand resection is taken to 12 mm, a 35 template is placed, lug holes  are drilled, trial patella is placed, and it tracks normally. Osteophytes are removed off the posterior femur with the trial in place. All trials are removed and the cut bone surfaces prepared with pulsatile lavage. Cement is mixed and once ready for implantation, the size D tibial implant, size  7 posterior stabilized femoral component, and the size 35 patella are cemented in place and the patella is held with the clamp. The trial insert is placed and the knee held in full extension. The Exparel (20 ml mixed with 60 ml saline) is injected into the extensor mechanism, posterior capsule, medial and lateral gutters and subcutaneous tissues.  All extruded cement is removed and once the cement is hard the permanent 10 mm posterior stabilized fixed bearing insert is placed into the tibial tray.      The wound is copiously irrigated with saline solution and the extensor mechanism closed with # 0 Stratofix suture. The tourniquet is released for a total tourniquet time of 47  minutes. Flexion against gravity is 140 degrees and the patella tracks normally. Subcutaneous tissue is closed with 2.0 vicryl and subcuticular with running 4.0 Monocryl. The incision is cleaned and dried and steri-strips and a bulky sterile dressing are applied. The limb is placed into a knee immobilizer and the patient is awakened and transported to recovery in stable condition.      Please note that a surgical assistant was a medical necessity for this procedure in order to perform it in a safe and expeditious manner. Surgical assistant was necessary to retract the ligaments and vital neurovascular structures to prevent injury to them and also necessary for proper positioning of the limb to allow for anatomic placement of the prosthesis.   Dione Plover Ladeana Laplant, MD    10/06/2021, 2:04 PM

## 2021-10-07 ENCOUNTER — Encounter (HOSPITAL_COMMUNITY): Payer: Self-pay | Admitting: Orthopedic Surgery

## 2021-10-07 DIAGNOSIS — E039 Hypothyroidism, unspecified: Secondary | ICD-10-CM | POA: Diagnosis not present

## 2021-10-07 DIAGNOSIS — Z85828 Personal history of other malignant neoplasm of skin: Secondary | ICD-10-CM | POA: Diagnosis not present

## 2021-10-07 DIAGNOSIS — Z96651 Presence of right artificial knee joint: Secondary | ICD-10-CM | POA: Diagnosis not present

## 2021-10-07 DIAGNOSIS — Z79899 Other long term (current) drug therapy: Secondary | ICD-10-CM | POA: Diagnosis not present

## 2021-10-07 DIAGNOSIS — M1712 Unilateral primary osteoarthritis, left knee: Secondary | ICD-10-CM | POA: Diagnosis not present

## 2021-10-07 DIAGNOSIS — Z7982 Long term (current) use of aspirin: Secondary | ICD-10-CM | POA: Diagnosis not present

## 2021-10-07 LAB — BASIC METABOLIC PANEL
Anion gap: 10 (ref 5–15)
BUN: 14 mg/dL (ref 8–23)
CO2: 25 mmol/L (ref 22–32)
Calcium: 8.2 mg/dL — ABNORMAL LOW (ref 8.9–10.3)
Chloride: 104 mmol/L (ref 98–111)
Creatinine, Ser: 1.16 mg/dL — ABNORMAL HIGH (ref 0.44–1.00)
GFR, Estimated: 50 mL/min — ABNORMAL LOW (ref >60)
Glucose, Bld: 131 mg/dL — ABNORMAL HIGH (ref 70–99)
Potassium: 4.3 mmol/L (ref 3.5–5.1)
Sodium: 139 mmol/L (ref 135–145)

## 2021-10-07 LAB — CBC
HCT: 37.2 % (ref 36.0–46.0)
Hemoglobin: 12.2 g/dL (ref 12.0–15.0)
MCH: 34.8 pg — ABNORMAL HIGH (ref 26.0–34.0)
MCHC: 32.8 g/dL (ref 30.0–36.0)
MCV: 106 fL — ABNORMAL HIGH (ref 80.0–100.0)
Platelets: 194 10*3/uL (ref 150–400)
RBC: 3.51 MIL/uL — ABNORMAL LOW (ref 3.87–5.11)
RDW: 13.1 % (ref 11.5–15.5)
WBC: 9.5 10*3/uL (ref 4.0–10.5)
nRBC: 0 % (ref 0.0–0.2)

## 2021-10-07 MED ORDER — KETOROLAC TROMETHAMINE 15 MG/ML IJ SOLN
7.5000 mg | Freq: Four times a day (QID) | INTRAMUSCULAR | Status: DC | PRN
  Administered 2021-10-07 – 2021-10-08 (×2): 7.5 mg via INTRAVENOUS
  Filled 2021-10-07 (×2): qty 1

## 2021-10-07 MED ORDER — HYDROMORPHONE HCL 2 MG PO TABS
2.0000 mg | ORAL_TABLET | ORAL | Status: DC | PRN
  Administered 2021-10-07: 4 mg via ORAL
  Administered 2021-10-07: 2 mg via ORAL
  Administered 2021-10-07 – 2021-10-08 (×2): 4 mg via ORAL
  Filled 2021-10-07: qty 1
  Filled 2021-10-07 (×3): qty 2

## 2021-10-07 MED ORDER — ALUM & MAG HYDROXIDE-SIMETH 200-200-20 MG/5ML PO SUSP
30.0000 mL | Freq: Four times a day (QID) | ORAL | Status: DC | PRN
Start: 2021-10-07 — End: 2021-10-08
  Administered 2021-10-07: 30 mL via ORAL
  Filled 2021-10-07: qty 30

## 2021-10-07 MED ORDER — RIVAROXABAN 10 MG PO TABS: 10.0000 mg | ORAL_TABLET | Freq: Every day | ORAL | 0 refills | Status: AC

## 2021-10-07 MED ORDER — HYDROMORPHONE HCL 2 MG PO TABS: 2.0000 mg | ORAL_TABLET | Freq: Four times a day (QID) | ORAL | 0 refills | Status: DC | PRN

## 2021-10-07 NOTE — Progress Notes (Signed)
Physical Therapy Treatment Patient Details Name: [name] MRN: [id number] DOB: November 19, 1950 Today's Date: 10/07/2021   History of Present Illness Pt is a 71yo female presenting s/p L-TKA on 10/06/21. PMH: anxiety, fibromyalgia, GERD, IBS, cervical fusion, R-TKA 2015.    PT Comments    Pt assisted with ambulating in hallway and very slow with mobility. Pt requiring min assist for LOB.  Pt not yet ready to practice steps.  Pt returned to room in recliner and attempted exercises however pt falling asleep.   Recommendations for follow up therapy are one component of a multi-disciplinary discharge planning process, led by the attending physician.  Recommendations may be updated based on patient status, additional functional criteria and insurance authorization.  Follow Up Recommendations  Follow physician's recommendations for discharge plan and follow up therapies     Assistance Recommended at Discharge Set up Supervision/Assistance  Patient can return home with the following A little help with walking and/or transfers;A little help with bathing/dressing/bathroom;Assistance with cooking/housework;Assist for transportation;Help with stairs or ramp for entrance   Equipment Recommendations  None recommended by PT    Recommendations for Other Services       Precautions / Restrictions Precautions Precautions: Knee;Fall Required Braces or Orthoses: Knee Immobilizer - Left Knee Immobilizer - Left: On when out of bed or walking Restrictions LLE Weight Bearing: Weight bearing as tolerated     Mobility  Bed Mobility Overal bed mobility: Needs Assistance Bed Mobility: Supine to Sit     Supine to sit: Min guard     General bed mobility comments: increased time and effort    Transfers Overall transfer level: Needs assistance Equipment used: Rolling walker (2 wheels) Transfers: Sit to/from Stand Sit to Stand: Min guard, From elevated surface           General transfer comment:  min/guard for safety, cues for UE and LE positioning    Ambulation/Gait Ambulation/Gait assistance: Min assist Gait Distance (Feet): 40 Feet Assistive device: Rolling walker (2 wheels) Gait Pattern/deviations: Step-to pattern, Decreased stance time - left, Antalgic Gait velocity: decreased     General Gait Details: step by step verbal cues for sequence, posture, RW positioning; one instance of LOB requiring min assist to correct; recliner following for safety, pt fatigued quickly   Stairs             Wheelchair Mobility    Modified Rankin (Stroke Patients Only)       Balance                                            Cognition Arousal/Alertness: Awake/alert Behavior During Therapy: WFL for tasks assessed/performed Overall Cognitive Status: Within Functional Limits for tasks assessed                                          Exercises Total Joint Exercises Ankle Circles/Pumps: AROM, Both, 10 reps Quad Sets: AROM, Left, 5 reps Heel Slides: AROM, Left, 5 reps    General Comments        Pertinent Vitals/Pain Pain Assessment Pain Assessment: 0-10 Pain Score: 4  Pain Location: L knee Pain Descriptors / Indicators: Sore Pain Intervention(s): Repositioned, Monitored during session    Home Living  Prior Function            PT Goals (current goals can now be found in the care plan section) Progress towards PT goals: Progressing toward goals    Frequency    7X/week      PT Plan Current plan remains appropriate    Co-evaluation              AM-PAC PT "6 Clicks" Mobility   Outcome Measure  Help needed turning from your back to your side while in a flat bed without using bedrails?: None Help needed moving from lying on your back to sitting on the side of a flat bed without using bedrails?: A Little Help needed moving to and from a bed to a chair (including a wheelchair)?: A  Little Help needed standing up from a chair using your arms (e.g., wheelchair or bedside chair)?: A Little Help needed to walk in hospital room?: A Little Help needed climbing 3-5 steps with a railing? : A Lot 6 Click Score: 18    End of Session Equipment Utilized During Treatment: Gait belt Activity Tolerance: Patient tolerated treatment well Patient left: in chair;with call bell/phone within reach;with chair alarm set   PT Visit Diagnosis: Difficulty in walking, not elsewhere classified (R26.2)     Time: 3832-9191 PT Time Calculation (min) (ACUTE ONLY): 30 min  Charges:  $Gait Training: 23-37 mins                    [name] PT, DPT Physical Therapist Acute Rehabilitation Services Preferred contact method: Secure Chat Weekend Pager Only: [phone] Office: [phone]    [name] [place] 10/07/2021, 2:04 PM

## 2021-10-07 NOTE — Progress Notes (Signed)
Physical Therapy Treatment Patient Details Name: [name] MRN: [id number] DOB: 10-05-1950 Today's Date: 10/07/2021   History of Present Illness Pt is a 71 yo female presenting s/p L-TKA on 10/06/21. PMH: anxiety, fibromyalgia, GERD, IBS, cervical fusion, R-TKA 2015.    PT Comments    Pt assisted with ambulating in hallway and returning to bed.  Pt more awake and alert this session.  Pt received 2 calls from [facility] regarding her xarelto question and on second call end of session.  Pt reports not yet voiding so RN notified.    Recommendations for follow up therapy are one component of a multi-disciplinary discharge planning process, led by the attending physician.  Recommendations may be updated based on patient status, additional functional criteria and insurance authorization.  Follow Up Recommendations  Follow physician's recommendations for discharge plan and follow up therapies     Assistance Recommended at Discharge Set up Supervision/Assistance  Patient can return home with the following A little help with walking and/or transfers;A little help with bathing/dressing/bathroom;Assistance with cooking/housework;Assist for transportation;Help with stairs or ramp for entrance   Equipment Recommendations  None recommended by PT    Recommendations for Other Services       Precautions / Restrictions Precautions Precautions: Knee;Fall Required Braces or Orthoses: Knee Immobilizer - Left Knee Immobilizer - Left: On when out of bed or walking Restrictions LLE Weight Bearing: Weight bearing as tolerated     Mobility  Bed Mobility Overal bed mobility: Needs Assistance Bed Mobility: Sit to Supine     Supine to sit: Min guard Sit to supine: Min guard   General bed mobility comments: increased time and effort    Transfers Overall transfer level: Needs assistance Equipment used: Rolling walker (2 wheels) Transfers: Sit to/from Stand Sit to Stand: Min guard            General transfer comment: min/guard for safety, cues for UE and LE positioning    Ambulation/Gait Ambulation/Gait assistance: Min guard Gait Distance (Feet): 40 Feet Assistive device: Rolling walker (2 wheels) Gait Pattern/deviations: Step-to pattern, Decreased stance time - left, Antalgic Gait velocity: decreased     General Gait Details: verbal cues for sequence, posture, RW positioning; recliner following for safety   Stairs             Wheelchair Mobility    Modified Rankin (Stroke Patients Only)       Balance                                            Cognition Arousal/Alertness: Awake/alert Behavior During Therapy: WFL for tasks assessed/performed Overall Cognitive Status: Within Functional Limits for tasks assessed                                             General Comments        Pertinent Vitals/Pain Pain Assessment Pain Assessment: 0-10 Pain Score: 4  Pain Location: L knee Pain Descriptors / Indicators: Sore Pain Intervention(s): Repositioned, Monitored during session    Home Living                          Prior Function            PT Goals (current  goals can now be found in the care plan section) Progress towards PT goals: Progressing toward goals    Frequency    7X/week      PT Plan Current plan remains appropriate    Co-evaluation              AM-PAC PT "6 Clicks" Mobility   Outcome Measure  Help needed turning from your back to your side while in a flat bed without using bedrails?: None Help needed moving from lying on your back to sitting on the side of a flat bed without using bedrails?: A Little Help needed moving to and from a bed to a chair (including a wheelchair)?: A Little Help needed standing up from a chair using your arms (e.g., wheelchair or bedside chair)?: A Little Help needed to walk in hospital room?: A Little Help needed climbing 3-5 steps with a  railing? : A Lot 6 Click Score: 18    End of Session Equipment Utilized During Treatment: Gait belt Activity Tolerance: Patient tolerated treatment well Patient left: with call bell/phone within reach;in bed;with nursing/sitter in room;with family/visitor present Nurse Communication: Mobility status PT Visit Diagnosis: Difficulty in walking, not elsewhere classified (R26.2)     Time: 9030-0923 PT Time Calculation (min) (ACUTE ONLY): 22 min  Charges:  $Gait Training: 8-22 mins                     Arlyce Dice, DPT Physical Therapist Acute Rehabilitation Services Preferred contact method: Secure Chat Weekend Pager Only: [phone] Office: [place] 10/07/2021, 3:45 PM

## 2021-10-07 NOTE — TOC Transition Note (Signed)
Transition of Care [facility]) - CM/SW Discharge Note   Patient Details  Name: [name] MRN: [id number] Date of Birth: [birth year]  Transition of Care [facility]) CM/SW Contact:  Lennart Pall, LCSW Phone Number: 10/07/2021, 11:24 AM   Clinical Narrative:    Met with pt and confirming she has all needed DME at home.  OPPT already set up with [facility]. No TOC needs.   Final next level of care: OP Rehab Barriers to Discharge: No Barriers Identified   Patient Goals and CMS Choice Patient states their goals for this hospitalization and ongoing recovery are:: return home      Discharge Placement                       Discharge Plan and Services                DME Arranged: N/A DME Agency: NA       HH Arranged: NA HH Agency: NA        Social Determinants of Health (SDOH) Interventions     Readmission Risk Interventions     No data to display
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
## 2021-10-07 NOTE — Plan of Care (Signed)
  Problem: Health Behavior/Discharge Planning: Goal: Ability to manage health-related needs will improve Outcome: Progressing   Problem: Clinical Measurements: Goal: Will remain free from infection Outcome: Progressing   Problem: Clinical Measurements: Goal: Respiratory complications will improve Outcome: Progressing   Problem: Activity: Goal: Risk for activity intolerance will decrease Outcome: Progressing   Problem: Nutrition: Goal: Adequate nutrition will be maintained Outcome: Progressing   Problem: Coping: Goal: Level of anxiety will decrease Outcome: Progressing   Problem: Elimination: Goal: Will not experience complications related to bowel motility Outcome: Progressing   Problem: Pain Managment: Goal: General experience of comfort will improve Outcome: Progressing

## 2021-10-07 NOTE — Plan of Care (Signed)
  Problem: Education: Goal: Knowledge of General Education information will improve Description: Including pain rating scale, medication(s)/side effects and non-pharmacologic comfort measures Outcome: Progressing   Problem: Health Behavior/Discharge Planning: Goal: Ability to manage health-related needs will improve Outcome: Progressing   Problem: Pain Managment: Goal: General experience of comfort will improve Outcome: Progressing   

## 2021-10-08 ENCOUNTER — Telehealth: Payer: Self-pay | Admitting: Cardiovascular Disease

## 2021-10-08 DIAGNOSIS — Z85828 Personal history of other malignant neoplasm of skin: Secondary | ICD-10-CM | POA: Diagnosis not present

## 2021-10-08 DIAGNOSIS — Z79899 Other long term (current) drug therapy: Secondary | ICD-10-CM | POA: Diagnosis not present

## 2021-10-08 DIAGNOSIS — Z96651 Presence of right artificial knee joint: Secondary | ICD-10-CM | POA: Diagnosis not present

## 2021-10-08 DIAGNOSIS — Z7982 Long term (current) use of aspirin: Secondary | ICD-10-CM | POA: Diagnosis not present

## 2021-10-08 DIAGNOSIS — E039 Hypothyroidism, unspecified: Secondary | ICD-10-CM | POA: Diagnosis not present

## 2021-10-08 DIAGNOSIS — M1712 Unilateral primary osteoarthritis, left knee: Secondary | ICD-10-CM | POA: Diagnosis not present

## 2021-10-08 LAB — CBC
HCT: 34.1 % — ABNORMAL LOW (ref 36.0–46.0)
Hemoglobin: 11.4 g/dL — ABNORMAL LOW (ref 12.0–15.0)
MCH: 34.9 pg — ABNORMAL HIGH (ref 26.0–34.0)
MCHC: 33.4 g/dL (ref 30.0–36.0)
MCV: 104.3 fL — ABNORMAL HIGH (ref 80.0–100.0)
Platelets: 196 10*3/uL (ref 150–400)
RBC: 3.27 MIL/uL — ABNORMAL LOW (ref 3.87–5.11)
RDW: 13.2 % (ref 11.5–15.5)
WBC: 10.3 10*3/uL (ref 4.0–10.5)
nRBC: 0 % (ref 0.0–0.2)

## 2021-10-08 NOTE — Progress Notes (Signed)
Physical Therapy Treatment Patient Details Name: [name] MRN: [id number] DOB: 1950-07-17 Today's Date: 10/08/2021   History of Present Illness Pt is a 71 yo female presenting s/p L-TKA on 10/06/21. PMH: anxiety, fibromyalgia, GERD, IBS, cervical fusion, R-TKA 2015.    PT Comments    Pt ambulated in hallway and practiced safe stair technique multiple times.  Pt also performed exercises.  Pt provided with HEP handout and stair handout.  Pt requiring repeated cues and increased time to process (likely from pain meds).  Pt encouraged to have spouse or her brother assist with mobility using gait belt initially upon return home for safety.  Pt feels ready for d/c home today.  RN notified of session.   Recommendations for follow up therapy are one component of a multi-disciplinary discharge planning process, led by the attending physician.  Recommendations may be updated based on patient status, additional functional criteria and insurance authorization.  Follow Up Recommendations  Follow physician's recommendations for discharge plan and follow up therapies     Assistance Recommended at Discharge Set up Supervision/Assistance  Patient can return home with the following A little help with walking and/or transfers;A little help with bathing/dressing/bathroom;Assistance with cooking/housework;Assist for transportation;Help with stairs or ramp for entrance   Equipment Recommendations  None recommended by PT    Recommendations for Other Services       Precautions / Restrictions Precautions Precautions: Knee;Fall Required Braces or Orthoses: Knee Immobilizer - Left Knee Immobilizer - Left: On when out of bed or walking Restrictions Weight Bearing Restrictions: No LLE Weight Bearing: Weight bearing as tolerated     Mobility  Bed Mobility Overal bed mobility: Needs Assistance Bed Mobility: Supine to Sit     Supine to sit: Min guard     General bed mobility comments: increased time  and effort    Transfers Overall transfer level: Needs assistance Equipment used: Rolling walker (2 wheels) Transfers: Sit to/from Stand Sit to Stand: Min guard           General transfer comment: min/guard for safety, cues for UE and LE positioning    Ambulation/Gait Ambulation/Gait assistance: Min guard Gait Distance (Feet): 60 Feet Assistive device: Rolling walker (2 wheels) Gait Pattern/deviations: Step-to pattern, Decreased stance time - left, Antalgic Gait velocity: decreased     General Gait Details: verbal cues for sequence, posture, RW positioning   Stairs Stairs: Yes Stairs assistance: Min guard Stair Management: Two rails, One rail Left, Step to pattern, Forwards Number of Stairs: 3 General stair comments: performed with 2 rails x2 and only 1 rail x4 (pt has 2 rails halfway and then 1 rail), pt aware to have assist of another person to bring RW and for safety   Wheelchair Mobility    Modified Rankin (Stroke Patients Only)       Balance                                            Cognition Arousal/Alertness: Awake/alert Behavior During Therapy: WFL for tasks assessed/performed Overall Cognitive Status: Within Functional Limits for tasks assessed                                 General Comments: slow to process, asked questions about things that had been explained previously - likely from pain meds  Exercises Total Joint Exercises Ankle Circles/Pumps: AROM, Both, 10 reps Quad Sets: AROM, Left Heel Slides: Left, 5 reps, AAROM Hip ABduction/ADduction: AAROM, Left, 5 reps Straight Leg Raises: AAROM, Left, 5 reps    General Comments        Pertinent Vitals/Pain Pain Assessment Pain Assessment: 0-10 Pain Score: 5  Pain Location: L knee Pain Descriptors / Indicators: Sore, Aching Pain Intervention(s): Monitored during session, Repositioned, Premedicated before session    Home Living                           Prior Function            PT Goals (current goals can now be found in the care plan section) Progress towards PT goals: Progressing toward goals    Frequency    7X/week      PT Plan Current plan remains appropriate    Co-evaluation              AM-PAC PT "6 Clicks" Mobility   Outcome Measure  Help needed turning from your back to your side while in a flat bed without using bedrails?: None Help needed moving from lying on your back to sitting on the side of a flat bed without using bedrails?: A Little Help needed moving to and from a bed to a chair (including a wheelchair)?: A Little Help needed standing up from a chair using your arms (e.g., wheelchair or bedside chair)?: A Little Help needed to walk in hospital room?: A Little Help needed climbing 3-5 steps with a railing? : A Little 6 Click Score: 19    End of Session Equipment Utilized During Treatment: Gait belt Activity Tolerance: Patient tolerated treatment well Patient left: with call bell/phone within reach;in chair;with chair alarm set Nurse Communication: Mobility status PT Visit Diagnosis: Difficulty in walking, not elsewhere classified (R26.2)     Time: 3614-4315 PT Time Calculation (min) (ACUTE ONLY): 30 min  Charges:  $Gait Training: 23-37 mins          Jannette Spanner PT, DPT Physical Therapist Acute Rehabilitation Services Preferred contact method: Secure Chat Weekend Pager Only: [phone] Office: [place] 10/08/2021, 12:11 PM

## 2021-10-08 NOTE — Progress Notes (Signed)
Patient's spouse present for discharge instructions. Reinforced (per pharmacy) that patient Lamictal is only once per day. Patient and spouse verbalized understanding of all medications and discharge instructions.

## 2021-10-08 NOTE — Telephone Encounter (Signed)
She is only taking Xarelto for 20 days after recent knee surgery. Inpt team sent in rx for 20 days to her pharmacy yesterday. Not sure if we have samples of '10mg'$  dose but ortho team prescribed rx, not cardiology. If she hasn't taken Xarelto before, she should also be able to use the 30 day free card.

## 2021-10-08 NOTE — Progress Notes (Signed)
Patient states she already has a walker at home

## 2021-10-08 NOTE — Telephone Encounter (Signed)
*  STAT* If patient is at the pharmacy, call can be transferred to refill team.   1. Which medications need to be refilled? (please list name of each medication and dose if known)   rivaroxaban (XARELTO) tablet 10 mg  2. Which pharmacy/location (including street and city if local pharmacy) is medication to be sent to?   3. Do they need a 30 day or 90 day supply?    Patient called stating she is out of this medication and will need a 20-day prescription and wanted to know if she could get some samples of this medication as it is very expensive.

## 2021-10-08 NOTE — Telephone Encounter (Signed)
Called pt to inform her that we did not have xarelto 10 mg tablet samples, but I could leave her a 30 day free coupon at the front desk at [facility] at [facility], if she has not use one before. I advise the pt that if she has any other problems, questions or concerns, to give our office a call back. Pt verbalized understanding.

## 2021-10-08 NOTE — Plan of Care (Signed)
  Problem: Education: Goal: Knowledge of General Education information will improve Description: Including pain rating scale, medication(s)/side effects and non-pharmacologic comfort measures Outcome: Progressing   Problem: Activity: Goal: Risk for activity intolerance will decrease Outcome: Progressing   Problem: Pain Managment: Goal: General experience of comfort will improve Outcome: Progressing   
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
## 2021-10-15 ENCOUNTER — Institutional Professional Consult (permissible substitution): Payer: Medicare Other | Admitting: Neurology

## 2021-10-15 DIAGNOSIS — M25562 Pain in left knee: Secondary | ICD-10-CM | POA: Diagnosis not present

## 2021-10-16 DIAGNOSIS — M5416 Radiculopathy, lumbar region: Secondary | ICD-10-CM | POA: Diagnosis not present

## 2021-10-16 DIAGNOSIS — M47816 Spondylosis without myelopathy or radiculopathy, lumbar region: Secondary | ICD-10-CM | POA: Diagnosis not present

## 2021-10-16 DIAGNOSIS — M47812 Spondylosis without myelopathy or radiculopathy, cervical region: Secondary | ICD-10-CM | POA: Diagnosis not present

## 2021-10-20 ENCOUNTER — Ambulatory Visit (INDEPENDENT_AMBULATORY_CARE_PROVIDER_SITE_OTHER): Payer: Medicare Other | Admitting: Psychiatry

## 2021-10-20 DIAGNOSIS — F411 Generalized anxiety disorder: Secondary | ICD-10-CM

## 2021-10-20 NOTE — Progress Notes (Signed)
[facility] Counselor/Therapist Progress Note  Patient ID: YAA DONNELLAN, MRN: [id number],    Date: 10/20/2021  Time Spent: 50 minutes   Virtual Visit via Telehealth Note: [place] with patient by a telemedicine/telehealth application, with their informed consent, and verified patient privacy and that I am speaking with the correct person using two identifiers. I discussed the limitations, risks, security and privacy concerns of performing psychotherapy and the availability of in person appointments. I also discussed with the patient that there may be a patient responsible charge related to this service. The patient expressed understanding and agreed to proceed. I discussed the treatment planning with the patient. The patient was provided an opportunity to ask questions and all were answered. The patient agreed with the plan and demonstrated an understanding of the instructions. The patient was advised to call  our office if  symptoms worsen or feel they are in a crisis state and need immediate contact.   Therapist Location: office Patient Location: home   Treatment Type: Individual Therapy  Reported Symptoms: anxiety, depression; had recent knee replacement approx 2 wks ago  Mental Status Exam:  Appearance:   Neat     Behavior:  Appropriate, Sharing, and Motivated  Motor:  Walking is affected by her recent knee surgery  Speech/Language:   Clear and Coherent  Affect:  Anxious, depressed  Mood:  anxious and depressed  Thought process:  normal  Thought content:    WNL  Sensory/Perceptual disturbances:    WNL  Orientation:  oriented to person, place, time/date, situation, day of week, month of year, year, and stated date of October 20, 2021  Attention:  Good  Concentration:  Good and Fair  Memory:  "Some short term memory issues at times"  Fund of knowledge:   Good  Insight:    Good  Judgment:   Good  Impulse Control:  Good   Risk Assessment: Danger to Self:   No Self-injurious Behavior: No Danger to Others: No Duty to Warn:no Physical Aggression / Violence:No  Access to Firearms a concern: No  Gang Involvement:No   Subjective: Patient today processing her more recent knee surgery and the challenges she is having. Anxiety and some depression re: wondering will I ever be better and able to walk on my own again, family issues, dealing with and affected by issues at her church in which she is heavily involved.  "Anxiety and some guilt that I can't do all the things that I can't do all the things I feel responsible for." Worked on her having more understanding of her need to recuperate and know that others can help cover her responsibilities at church and in other things, until she heals more and can resume more normal activities. Was able to think of certain people that she can call if needed for assistance at home and also to help cover some of her church responsibilities. Processed some family concerns re: daughter and family with current situation in which they are trying to make decisions.  Still worries about her aging. Husband with Parkinson's has done quite well in supporting patient thus far in midst of her knee surgery and recuperation. Confirms her app[facility] geriatric [facility]y dept [facility] re: "my memory concerns."  Interventions: Cognitive Behavioral Therapy and Ego-Supportive  Treatment goals: Goals may remain on tx plan as patient works on strategies to meet her goals. Progress is noted every session in the "Progress" section of Plan. Long term goal: Develop healthy cognitive patterns  and beliefs about self and the world that lead to alleviation of depression and anxiety, and help prevent relapse of depression and anxiety. Short term goal: Learn and implement personal skills for managing stress, solving daily problems, and resolving conflicts effectively.  Strategies: Use modeling and role-playing to help recognize anxious/depressive/negative  thought patterns that create anxious/depressive/negative feelings and actions, interrupt them and replace with more positive reality-based thoughts that do not support depression  Diagnosis:   ICD-10-CM   1. Generalized anxiety disorder  F41.1      Plan: Patient today actively involved in session as she worked further on her anxiety and depression which has been aggravated by her recent knee replacement surgery and a couple of problems with it including a fall but is rebounding from it.  She is to speak with her doctor's PA again tomorrow.  Feeling some guilt as noted above about her sense of responsibility and not being able to follow through on all she normally does, however, was able to talk through this more in session today and get some support and understanding that she is "1 person, has had knee surgery recently, and is needing to take care of herself first before resuming other responsibilities that require more mobility".  This seemed to help reframe some of patient's previous thoughts and become more realistic about prioritizing her needs as well as her commitments.  Also worked on some family concerns regarding a daughter and her family in process of making some significant decisions, which increases patient's stress due young granddaughter involved.  Was able to feel heard and supported with her concerns and yet also realizing she was "jumping ahead a bit and making some assumptions that may or may not be true", and willing to pause and wait to hear more from her daughter rather than assuming.  This seemed helpful and calming to patient.  To continue goal directed behaviors aimed at her depression and anxiety and especially as they relate to her concerns about aging. Encouraged patient in her practice of more positive behaviors including: Continue to work on her depression/anxiety/fears of the unknown, believing more in herself that she can continue to make positive changes and feel better about  herself, interrupting anxious/depressive thoughts and challenging them to replace with more supportive and realistic thoughts, getting outside daily especially with her dog which is very therapeutic for her, staying in contact with people who are supportive, remaining in the present and focused on what she can control or change, stay involved in church activities that she and husband enjoy together, allow her faith to be a healing resource for her, spending enjoyable time with friends, exercising healthy boundaries with others as needed, saying no when she needs to say no, use appropriate limits within her family as needed, positive self talk, and recognize the strength she shows working with goal directed behaviors to move in a direction that supports her improved emotional health and overall outlook.  Goal review and progress/challenges noted with patient.  Next appointment within 3 weeks.  This record has been created using [employer].  Chart creation errors have been sought, but may not always have been located and corrected.  Such creation errors do not reflect on the standard of medical care provided.   Shanon Ace, LCSW

## 2021-10-21 DIAGNOSIS — Z5189 Encounter for other specified aftercare: Secondary | ICD-10-CM | POA: Diagnosis not present

## 2021-10-21 DIAGNOSIS — M25552 Pain in left hip: Secondary | ICD-10-CM | POA: Diagnosis not present

## 2021-10-27 DIAGNOSIS — M25562 Pain in left knee: Secondary | ICD-10-CM | POA: Diagnosis not present

## 2021-10-27 DIAGNOSIS — Z1231 Encounter for screening mammogram for malignant neoplasm of breast: Secondary | ICD-10-CM | POA: Diagnosis not present

## 2021-10-28 ENCOUNTER — Ambulatory Visit: Payer: Medicare Other | Admitting: Psychiatry

## 2021-10-28 DIAGNOSIS — Z5189 Encounter for other specified aftercare: Secondary | ICD-10-CM | POA: Diagnosis not present

## 2021-11-04 DIAGNOSIS — Z5189 Encounter for other specified aftercare: Secondary | ICD-10-CM | POA: Diagnosis not present

## 2021-11-04 DIAGNOSIS — Z79899 Other long term (current) drug therapy: Secondary | ICD-10-CM | POA: Diagnosis not present

## 2021-11-04 DIAGNOSIS — G3184 Mild cognitive impairment, so stated: Secondary | ICD-10-CM | POA: Diagnosis not present

## 2021-11-05 ENCOUNTER — Institutional Professional Consult (permissible substitution): Payer: Medicare Other | Admitting: Neurology

## 2021-11-10 ENCOUNTER — Encounter: Payer: Self-pay | Admitting: Neurology

## 2021-11-10 ENCOUNTER — Ambulatory Visit: Payer: Medicare Other | Admitting: Psychiatry

## 2021-11-10 DIAGNOSIS — M25562 Pain in left knee: Secondary | ICD-10-CM | POA: Diagnosis not present

## 2021-11-11 ENCOUNTER — Institutional Professional Consult (permissible substitution): Payer: Medicare Other | Admitting: Neurology

## 2021-11-11 ENCOUNTER — Telehealth: Payer: Self-pay | Admitting: Psychiatry

## 2021-11-11 ENCOUNTER — Encounter: Payer: Self-pay | Admitting: Psychiatry

## 2021-11-11 ENCOUNTER — Telehealth (INDEPENDENT_AMBULATORY_CARE_PROVIDER_SITE_OTHER): Payer: Medicare Other | Admitting: Psychiatry

## 2021-11-11 DIAGNOSIS — F5101 Primary insomnia: Secondary | ICD-10-CM | POA: Diagnosis not present

## 2021-11-11 MED ORDER — TRAZODONE HCL 50 MG PO TABS: ORAL_TABLET | ORAL | 2 refills | Status: DC

## 2021-11-11 NOTE — Telephone Encounter (Signed)
Pt said she just had appt with Janett Billow this morning and forgot to mention that she has been having a low grade headache.  She said Wellbutrin is the newest medicine she started and she wonders if Janett Billow thinks that might be the cause.  Next appt 10/12
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
## 2021-11-12 ENCOUNTER — Ambulatory Visit (INDEPENDENT_AMBULATORY_CARE_PROVIDER_SITE_OTHER): Payer: Medicare Other | Admitting: Psychiatry

## 2021-11-12 DIAGNOSIS — F411 Generalized anxiety disorder: Secondary | ICD-10-CM

## 2021-11-12 DIAGNOSIS — M25562 Pain in left knee: Secondary | ICD-10-CM | POA: Diagnosis not present

## 2021-11-12 NOTE — Telephone Encounter (Signed)
Please let her know that headaches are a very common side effect with Wellbutrin and that 150 mg is the lowest dose of Wellbutrin XL. Wellbutrin SR is available in a 100 mg dose that she could try if she would like, however she may or may not still have headache with this. Her other option is to stop Wellbutrin altogether. There is no need to taper off since she is on the lowest dose.

## 2021-11-12 NOTE — Progress Notes (Signed)
[facility] Counselor/Therapist Progress Note  Patient ID: [name], MRN: [id number],    Date: 11/12/2021  Time Spent: 50 minutes  Treatment Type: Individual Therapy  Reported Symptoms: anxious, depression, stressed recovering from knee replacement surgery  Mental Status Exam:  Appearance:   Casual and Neat     Behavior:  Appropriate, Sharing, and Motivated  Motor:  Affected due to recent knee replacement surgery  Speech/Language:   Clear and Coherent  Affect:  Anxious, depression  Mood:  anxious and depressed  Thought process:  goal directed  Thought content:    WNL  Sensory/Perceptual disturbances:    WNL  Orientation:  oriented to person, place, time/date, situation, day of week, month of year, year, and stated date of Sept. 13, 2023  Attention:  Good  Concentration:  Good  Memory:  Some short term memory issues but  recently tested and was told she did not show memory concerns.  Fund of knowledge:   Good  Insight:    Good  Judgment:   Good  Impulse Control:  Good   Risk Assessment: Danger to Self:  No Self-injurious Behavior: No Danger to Others: No Duty to Warn:no Physical Aggression / Violence:No  Access to Firearms a concern: No  Gang Involvement:No   Subjective: Patient today needing session to process and receive support with her anxiety, depression, and worries health concerns of she and her husband. Also very upset re: pastor's unexpected resignation and plan to move to [place]. Patient has been very upset by this and talked openly today re: her sense of loss and being shocked by his moving away as she had felt close connection with him at her church.  It seems that this situation reminded her also some previous situations of loss or unexpected changes that have been painful for her in the past.  Did really well in processing her feelings of sadness, anxiety, depression, and all of the unexpectedness of what has happened most recently for her.  Continues  to make progress with her recent knee replacement surgery with a couple of problems recently where she had to go back into her surgeon but they were able to relieve the problem.  Still reports worrying about her aging and trying not to make assumptions based on her fears which she states she will talk about further in a future session as we ran out of time today.  Did have an evaluation recently regarding her memory and was told she definitely did not have dementia but some mild cognitive impairment.  Patient states she plans to contact their office to get a copy of the report.  Interventions: Cognitive Behavioral Therapy and Ego-Supportive  Treatment goals: Goals may remain on tx plan as patient works on strategies to meet her goals. Progress is noted every session in the "Progress" section of Plan. Long term goal: Develop healthy cognitive patterns and beliefs about self and the world that lead to alleviation of depression and anxiety, and help prevent relapse of depression and anxiety. Short term goal: Learn and implement personal skills for managing stress, solving daily problems, and resolving conflicts effectively.  Strategies: Use modeling and role-playing to help recognize anxious/depressive/negative thought patterns that create anxious/depressive/negative feelings and actions, interrupt them and replace with more positive reality-based thoughts that do not support depression  Diagnosis:   ICD-10-CM   1. Generalized anxiety disorder  F41.1      Plan: Patient today showing active participation in session as well as motivation as she  worked on some unexpected issues that have arisen for her regarding a sudden departure of her minister with whom she and her husband had felt very close to.  Processed her shock, anxiety, depression, some frustration but also trying to trust that "he must have made a decision that he felt he needed to make and we just may not understand it".  She was much calmer  by end of session and able to refocus on some positives in her life and the fact that she is moving forward after having her recent knee replacement, husband's seeming to do better with his physical/emotional issues, and the 2 of them working together well especially during her recuperation after surgery.  Continues to have concerns and fears regarding "aging" and we will follow up more on this.  Wanting to discuss more family concerns next session as we ran out of time today. Encouraged patient in practicing more positive behaviors as noted in sessions including: Believing in herself more that she can continue to make positive changes and feel better about herself, continue to work on her depression/anxiety/fears of the unknown, interrupting anxious/depressive thoughts and challenging them to replace with more supportive and realistic thoughts, getting outside daily especially with her dog which is very therapeutic for her, remaining in contact with people who are supportive, staying in the present and focused on what she can control or change, remain involved in church activities that she and husband enjoyed together, allow her faith to be a healing resource for her, spending enjoyable time with friends, saying no when she needs to say no, exercising healthy boundaries with others, using appropriate limits within her family as needed, use more positive self talk, and realize the strength she shows working with goal directed behaviors to move in a direction that supports her improved emotional health and overall outlook.  Review and progress/challenges noted with patient.  Next appointment within 2 to 3 weeks.  This record has been created using [employer].  Chart creation errors have been sought, but may not always have been located and corrected.  Such creation errors do not reflect on the standard of medical care provided.   Shanon Ace, LCSW

## 2021-11-12 NOTE — Telephone Encounter (Signed)
Patient notified of options. She said she would let us know what she wants to do, she needs to think about it.

## 2021-11-12 NOTE — Telephone Encounter (Signed)
Patient said she forgot to mention at her appt that she was having mild headaches. She said these are in the back of her head. She has had migraines and said these are nothing like that. I asked if she took anything for HA and she said she took Tylenol and it did help but headaches were present all day. She was wondering if the Wellbutrin could be causing headaches and if she should lower her dose.

## 2021-11-17 ENCOUNTER — Ambulatory Visit: Payer: Medicare Other | Admitting: Psychiatry

## 2021-11-20 DIAGNOSIS — M25551 Pain in right hip: Secondary | ICD-10-CM | POA: Diagnosis not present

## 2021-11-20 DIAGNOSIS — M79672 Pain in left foot: Secondary | ICD-10-CM | POA: Diagnosis not present

## 2021-11-20 DIAGNOSIS — M25511 Pain in right shoulder: Secondary | ICD-10-CM | POA: Diagnosis not present

## 2021-11-21 DIAGNOSIS — M25562 Pain in left knee: Secondary | ICD-10-CM | POA: Diagnosis not present

## 2021-11-24 ENCOUNTER — Ambulatory Visit: Payer: Medicare Other | Admitting: Psychiatry

## 2021-11-24 DIAGNOSIS — M25562 Pain in left knee: Secondary | ICD-10-CM | POA: Diagnosis not present

## 2021-11-28 DIAGNOSIS — Z03818 Encounter for observation for suspected exposure to other biological agents ruled out: Secondary | ICD-10-CM | POA: Diagnosis not present

## 2021-11-28 DIAGNOSIS — R0981 Nasal congestion: Secondary | ICD-10-CM | POA: Diagnosis not present

## 2021-11-28 DIAGNOSIS — J069 Acute upper respiratory infection, unspecified: Secondary | ICD-10-CM | POA: Diagnosis not present

## 2021-11-28 DIAGNOSIS — Z6826 Body mass index (BMI) 26.0-26.9, adult: Secondary | ICD-10-CM | POA: Diagnosis not present

## 2021-12-01 ENCOUNTER — Ambulatory Visit: Payer: Medicare Other | Admitting: Psychiatry

## 2021-12-04 ENCOUNTER — Encounter: Payer: Self-pay | Admitting: Neurology

## 2021-12-04 ENCOUNTER — Ambulatory Visit (INDEPENDENT_AMBULATORY_CARE_PROVIDER_SITE_OTHER): Payer: Medicare Other | Admitting: Neurology

## 2021-12-04 VITALS — BP 113/74 | HR 72 | Ht 65.0 in | Wt 158.0 lb

## 2021-12-04 DIAGNOSIS — J300 Vasomotor rhinitis: Secondary | ICD-10-CM | POA: Diagnosis not present

## 2021-12-04 DIAGNOSIS — R0683 Snoring: Secondary | ICD-10-CM | POA: Diagnosis not present

## 2021-12-04 DIAGNOSIS — Z789 Other specified health status: Secondary | ICD-10-CM

## 2021-12-04 DIAGNOSIS — G894 Chronic pain syndrome: Secondary | ICD-10-CM | POA: Diagnosis not present

## 2021-12-04 DIAGNOSIS — I2510 Atherosclerotic heart disease of native coronary artery without angina pectoris: Secondary | ICD-10-CM | POA: Diagnosis not present

## 2021-12-04 DIAGNOSIS — I493 Ventricular premature depolarization: Secondary | ICD-10-CM | POA: Diagnosis not present

## 2021-12-04 DIAGNOSIS — M170 Bilateral primary osteoarthritis of knee: Secondary | ICD-10-CM | POA: Diagnosis not present

## 2021-12-04 NOTE — Patient Instructions (Signed)
Living With Sleep Apnea Sleep apnea is a condition in which breathing pauses or becomes shallow during sleep. Sleep apnea is most commonly caused by a collapsed or blocked airway. People with sleep apnea usually snore loudly. They may have times when they gasp and stop breathing for 10 seconds or more during sleep. This may happen many times during the night. The breaks in breathing also interrupt the deep sleep that you need to feel rested. Even if you do not completely wake up from the gaps in breathing, your sleep may not be restful and you feel tired during the day. You may also have a headache in the morning and low energy during the day, and you may feel anxious or depressed. How can sleep apnea affect me? Sleep apnea increases your chances of extreme tiredness during the day (daytime fatigue). It can also increase your risk for health conditions, such as: Heart attack. Stroke. Obesity. Type 2 diabetes. Heart failure. Irregular heartbeat. High blood pressure. If you have daytime fatigue as a result of sleep apnea, you may be more likely to: Perform poorly at school or work. Fall asleep while driving. Have difficulty with attention. Develop depression or anxiety. Have sexual dysfunction. What actions can I take to manage sleep apnea? Sleep apnea treatment  If you were given a device to open your airway while you sleep, use it only as told by your health care provider. You may be given: An oral appliance. This is a custom-made mouthpiece that shifts your lower jaw forward. A continuous positive airway pressure (CPAP) device. This device blows air through a mask when you breathe out (exhale). A nasal expiratory positive airway pressure (EPAP) device. This device has valves that you put into each nostril. A bi-level positive airway pressure (BIPAP) device. This device blows air through a mask when you breathe in (inhale) and breathe out (exhale). You may need surgery if other treatments  do not work for you. Sleep habits Go to sleep and wake up at the same time every day. This helps set your internal clock (circadian rhythm) for sleeping. If you stay up later than usual, such as on weekends, try to get up in the morning within 2 hours of your normal wake time. Try to get at least 7-9 hours of sleep each night. Stop using a computer, tablet, and mobile phone a few hours before bedtime. Do not take long naps during the day. If you nap, limit it to 30 minutes. Have a relaxing bedtime routine. Reading or listening to music may relax you and help you sleep. Use your bedroom only for sleep. Keep your television and computer out of your bedroom. Keep your bedroom cool, dark, and quiet. Use a supportive mattress and pillows. Follow your health care provider's instructions for other changes to sleep habits. Nutrition Do not eat heavy meals in the evening. Do not have caffeine in the later part of the day. The effects of caffeine can last for more than 5 hours. Follow your health care provider's or dietitian's instructions for any diet changes. Lifestyle     Do not drink alcohol before bedtime. Alcohol can cause you to fall asleep at first, but then it can cause you to wake up in the middle of the night and have trouble getting back to sleep. Do not use any products that contain nicotine or tobacco. These products include cigarettes, chewing tobacco, and vaping devices, such as e-cigarettes. If you need help quitting, ask your health care provider. Medicines Take   over-the-counter and prescription medicines only as told by your health care provider. Do not use over-the-counter sleep medicine. You can become dependent on this medicine, and it can make sleep apnea worse. Do not use medicines, such as sedatives and narcotics, unless told by your health care provider. Activity Exercise on most days, but avoid exercising in the evening. Exercising near bedtime can interfere with  sleeping. If possible, spend time outside every day. Natural light helps regulate your circadian rhythm. General information Lose weight if you need to, and maintain a healthy weight. Keep all follow-up visits. This is important. If you are having surgery, make sure to tell your health care provider that you have sleep apnea. You may need to bring your device with you. Where to find more information Learn more about sleep apnea and daytime fatigue from: American Sleep Association: sleepassociation.org National Sleep Foundation: sleepfoundation.org National Heart, Lung, and Blood Institute: nhlbi.nih.gov Summary Sleep apnea is a condition in which breathing pauses or becomes shallow during sleep. Sleep apnea can cause daytime fatigue and other serious health conditions. You may need to wear a device while sleeping to help keep your airway open. If you are having surgery, make sure to tell your health care provider that you have sleep apnea. You may need to bring your device with you. Making changes to sleep habits, diet, lifestyle, and activity can help you manage sleep apnea. This information is not intended to replace advice given to you by your health care provider. Make sure you discuss any questions you have with your health care provider. Document Revised: 09/25/2020 Document Reviewed: 01/26/2020 Elsevier Patient Education  2023 [employer]  
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
## 2021-12-05 DIAGNOSIS — Z96652 Presence of left artificial knee joint: Secondary | ICD-10-CM | POA: Diagnosis not present

## 2021-12-05 DIAGNOSIS — M25551 Pain in right hip: Secondary | ICD-10-CM | POA: Diagnosis not present

## 2021-12-08 DIAGNOSIS — M25562 Pain in left knee: Secondary | ICD-10-CM | POA: Diagnosis not present

## 2021-12-09 DIAGNOSIS — F0390 Unspecified dementia without behavioral disturbance: Secondary | ICD-10-CM | POA: Diagnosis not present

## 2021-12-10 DIAGNOSIS — N3946 Mixed incontinence: Secondary | ICD-10-CM | POA: Diagnosis not present

## 2021-12-11 ENCOUNTER — Ambulatory Visit: Payer: Medicare Other | Admitting: Psychiatry

## 2021-12-11 DIAGNOSIS — M25562 Pain in left knee: Secondary | ICD-10-CM | POA: Diagnosis not present

## 2021-12-12 DIAGNOSIS — M25511 Pain in right shoulder: Secondary | ICD-10-CM | POA: Diagnosis not present

## 2021-12-12 DIAGNOSIS — M25551 Pain in right hip: Secondary | ICD-10-CM | POA: Diagnosis not present

## 2021-12-12 DIAGNOSIS — M25552 Pain in left hip: Secondary | ICD-10-CM | POA: Diagnosis not present

## 2021-12-15 ENCOUNTER — Ambulatory Visit: Payer: Medicare Other | Admitting: Psychiatry

## 2021-12-16 ENCOUNTER — Ambulatory Visit: Payer: Medicare Other | Admitting: Psychiatry

## 2021-12-18 ENCOUNTER — Telehealth: Payer: Self-pay | Admitting: Neurology

## 2021-12-18 ENCOUNTER — Encounter: Payer: Self-pay | Admitting: Psychiatry

## 2021-12-18 ENCOUNTER — Ambulatory Visit (INDEPENDENT_AMBULATORY_CARE_PROVIDER_SITE_OTHER): Payer: Medicare Other | Admitting: Psychiatry

## 2021-12-18 ENCOUNTER — Other Ambulatory Visit (HOSPITAL_COMMUNITY): Payer: Medicare Other

## 2021-12-18 DIAGNOSIS — F330 Major depressive disorder, recurrent, mild: Secondary | ICD-10-CM

## 2021-12-18 DIAGNOSIS — F5101 Primary insomnia: Secondary | ICD-10-CM

## 2021-12-18 DIAGNOSIS — F411 Generalized anxiety disorder: Secondary | ICD-10-CM | POA: Diagnosis not present

## 2021-12-18 MED ORDER — LAMOTRIGINE 100 MG PO TABS: 100.0000 mg | ORAL_TABLET | Freq: Every day | ORAL | 2 refills | Status: DC

## 2021-12-18 MED ORDER — TRAZODONE HCL 50 MG PO TABS: 50.0000 mg | ORAL_TABLET | Freq: Every day | ORAL | 1 refills | Status: DC

## 2021-12-18 NOTE — Telephone Encounter (Signed)
HST- Medicare/ no auth req.  Patient is scheduled at [facility] for 01/20/22 at 1 pm.  Mailed packet to the patient.
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
## 2021-12-19 DIAGNOSIS — Z23 Encounter for immunization: Secondary | ICD-10-CM | POA: Diagnosis not present

## 2021-12-19 DIAGNOSIS — M25562 Pain in left knee: Secondary | ICD-10-CM | POA: Diagnosis not present

## 2021-12-22 ENCOUNTER — Ambulatory Visit (INDEPENDENT_AMBULATORY_CARE_PROVIDER_SITE_OTHER): Payer: Medicare Other | Admitting: Psychiatry

## 2021-12-22 DIAGNOSIS — F411 Generalized anxiety disorder: Secondary | ICD-10-CM | POA: Diagnosis not present
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
## 2021-12-22 NOTE — Progress Notes (Signed)
[facility] Counselor/Therapist Progress Note  Patient ID: [name], MRN: [id number],    Date: 12/22/2021  Time Spent: 55 minutes   Treatment Type: Individual Therapy  Reported Symptoms: anxiety, depression, overwhelmed    Mental Status Exam:  Appearance:   Neat     Behavior:  Appropriate, Sharing, and Motivated  Motor:  Almost normal after having had knee surgery  Speech/Language:   Clear and Coherent  Affect:  Depressed and anxious  Mood:  anxious, depressed, and overwhemed  Thought process:  goal directed  Thought content:    WNL  Sensory/Perceptual disturbances:    WNL  Orientation:  oriented to person, place, time/date, situation, day of week, month of year, year, and stated date of Oct. 23, 2023  Attention:  Good  Concentration:  Good  Memory:  Some occasional short term issues  Fund of knowledge:   Good  Insight:    Good  Judgment:   Good  Impulse Control:  Good   Risk Assessment: Danger to Self:  No Self-injurious Behavior: No Danger to Others: No Duty to Warn:no Physical Aggression / Violence:No  Access to Firearms a concern: No  Gang Involvement:No   Subjective:  Patient in today reporting anxiety and depression mostly related to her recovery and so much that has happened since her knee replacement and eventual fall, situation at her church, personal and family concerns. Anxiety has been stronger symptom and "getting older has a lot to do with it, and having trouble feeling that my aging has affected her mobility in doing some tasks." Relationship with brother improved some "very gradually".  "I get sad that I can't keep up with my 73 yr old grandchild". Concerned at some of the cognitive decline of her husband. Is trying to be active some as she recovers more from her knee surgery. Concerned about her and husband's health needs into the future. Still healing from minister ending his time at her church and moved [place]. That loss "drug up some other old  losses" patient has encountered during her life. Feels she is doing better. Trying to focus on the present more and letting herself heal, follow up on Dr recommendations of "water physical therapy" at gym, getting outside some each day, staying connected with her church friends, and remain on her meds as prescribed.   Interventions: Cognitive Behavioral Therapy and Ego-Supportive  Treatment goals: Goals may remain on tx plan as patient works on strategies to meet her goals. Progress is noted every session in the "Progress" section of Plan. Long term goal: Develop healthy cognitive patterns and beliefs about self and the world that lead to alleviation of depression and anxiety, and help prevent relapse of depression and anxiety. Short term goal: Learn and implement personal skills for managing stress, solving daily problems, and resolving conflicts effectively.  Strategies: Use modeling and role-playing to help recognize anxious/depressive/negative thought patterns that create anxious/depressive/negative feelings and actions, interrupt them and replace with more positive reality-based thoughts that do not support depression  Diagnosis:   ICD-10-CM   1. Generalized anxiety disorder  F41.1      Plan:  Patient today showing good motivation and participation in session as we focused on her anxiety and depression and as noted above multiple stressors that have occurred since her knee replacement and eventual fall.  Is very involved and has a lot of love for others within her church and the difficulties they are going through as a church have also weighed in on  patient's anxiety and some depression.  Is good about coming into session and venting very freely and also working with strategies to better manage those anxieties and sometimes catch herself "over worrying".  One of her bigger worries is "the whole process of aging".  Concern for her and her husband especially as they had into the future.  Family  stressors also factor into her anxiety and depression but seems to be not taking certain things as personally as she used to and those things used to be quite hurtful.  Does not report being as concerned now about memory issues as she went to a doctor to be tested and states that she does not have any significant memory concerns at this point. Encouraged patient in her practice of positive behaviors as noted in sessions including: Continue to work on her depression/anxiety/fears of the unknown, believe more in herself that she can make significant positive changes and feel better about herself and her situation, interrupt anxious/depressive thoughts and challenge them to replace with more supportive and realistic thoughts, getting outside daily especially with her dog which is very therapeutic for her, staying in contact with people who are supportive, remain in the present and focused on what she can control her change, remain involved in church activities that she and husband enjoyed together, allow her faith to be a healing resource for her, spending enjoyable time with friends, saying no when she needs to say no, using healthy boundaries with others, having appropriate limits within her family as needed, more positive self talk, and recognize the strengths she shows working with goal-directed behaviors to move in a direction that supports her improved emotional health.  Goal reviewing progress/challenges noted with patient.  Next appointment within 2 to 3 weeks.  This record has been created using [employer].  Chart creation errors have been sought, but may not always have been located and corrected.  Such creation errors do not reflect on the standard of medical care provided.   Shanon Ace, LCSW

## 2021-12-23 ENCOUNTER — Ambulatory Visit (HOSPITAL_BASED_OUTPATIENT_CLINIC_OR_DEPARTMENT_OTHER): Payer: Medicare Other | Attending: Orthopedic Surgery | Admitting: Physical Therapy

## 2021-12-23 ENCOUNTER — Other Ambulatory Visit: Payer: Self-pay

## 2021-12-23 ENCOUNTER — Encounter (HOSPITAL_BASED_OUTPATIENT_CLINIC_OR_DEPARTMENT_OTHER): Payer: Self-pay | Admitting: Physical Therapy

## 2021-12-23 ENCOUNTER — Telehealth: Payer: Self-pay

## 2021-12-23 DIAGNOSIS — Z96652 Presence of left artificial knee joint: Secondary | ICD-10-CM | POA: Insufficient documentation

## 2021-12-23 DIAGNOSIS — F5101 Primary insomnia: Secondary | ICD-10-CM

## 2021-12-23 DIAGNOSIS — M25562 Pain in left knee: Secondary | ICD-10-CM | POA: Insufficient documentation

## 2021-12-23 DIAGNOSIS — M25662 Stiffness of left knee, not elsewhere classified: Secondary | ICD-10-CM | POA: Insufficient documentation

## 2021-12-23 DIAGNOSIS — M6281 Muscle weakness (generalized): Secondary | ICD-10-CM | POA: Diagnosis not present

## 2021-12-23 MED ORDER — TRAZODONE HCL 50 MG PO TABS: 25.0000 mg | ORAL_TABLET | Freq: Every day | ORAL | 1 refills | Status: DC

## 2021-12-23 NOTE — Telephone Encounter (Signed)
Sent!

## 2021-12-25 ENCOUNTER — Ambulatory Visit: Payer: Medicare Other | Admitting: Psychiatry

## 2021-12-25 ENCOUNTER — Encounter (HOSPITAL_BASED_OUTPATIENT_CLINIC_OR_DEPARTMENT_OTHER): Payer: Self-pay | Admitting: Physical Therapy

## 2021-12-25 ENCOUNTER — Ambulatory Visit (HOSPITAL_BASED_OUTPATIENT_CLINIC_OR_DEPARTMENT_OTHER): Payer: Medicare Other | Admitting: Physical Therapy

## 2021-12-25 DIAGNOSIS — M25562 Pain in left knee: Secondary | ICD-10-CM

## 2021-12-25 DIAGNOSIS — M6281 Muscle weakness (generalized): Secondary | ICD-10-CM | POA: Diagnosis not present

## 2021-12-25 DIAGNOSIS — M25662 Stiffness of left knee, not elsewhere classified: Secondary | ICD-10-CM | POA: Diagnosis not present

## 2021-12-25 DIAGNOSIS — Z96652 Presence of left artificial knee joint: Secondary | ICD-10-CM | POA: Diagnosis not present
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
## 2021-12-26 DIAGNOSIS — M810 Age-related osteoporosis without current pathological fracture: Secondary | ICD-10-CM | POA: Diagnosis not present

## 2021-12-26 DIAGNOSIS — E785 Hyperlipidemia, unspecified: Secondary | ICD-10-CM | POA: Diagnosis not present

## 2021-12-26 DIAGNOSIS — R7989 Other specified abnormal findings of blood chemistry: Secondary | ICD-10-CM | POA: Diagnosis not present

## 2021-12-26 DIAGNOSIS — E039 Hypothyroidism, unspecified: Secondary | ICD-10-CM | POA: Diagnosis not present

## 2021-12-26 DIAGNOSIS — Z1212 Encounter for screening for malignant neoplasm of rectum: Secondary | ICD-10-CM | POA: Diagnosis not present

## 2021-12-31 ENCOUNTER — Ambulatory Visit (HOSPITAL_BASED_OUTPATIENT_CLINIC_OR_DEPARTMENT_OTHER): Payer: Medicare Other | Attending: Orthopedic Surgery | Admitting: Physical Therapy

## 2021-12-31 DIAGNOSIS — M6281 Muscle weakness (generalized): Secondary | ICD-10-CM | POA: Diagnosis not present

## 2021-12-31 DIAGNOSIS — M25562 Pain in left knee: Secondary | ICD-10-CM | POA: Insufficient documentation

## 2021-12-31 DIAGNOSIS — M25662 Stiffness of left knee, not elsewhere classified: Secondary | ICD-10-CM | POA: Insufficient documentation
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
## 2022-01-02 DIAGNOSIS — E785 Hyperlipidemia, unspecified: Secondary | ICD-10-CM | POA: Diagnosis not present

## 2022-01-02 DIAGNOSIS — F0390 Unspecified dementia without behavioral disturbance: Secondary | ICD-10-CM | POA: Diagnosis not present

## 2022-01-02 DIAGNOSIS — M1990 Unspecified osteoarthritis, unspecified site: Secondary | ICD-10-CM | POA: Diagnosis not present

## 2022-01-02 DIAGNOSIS — M7918 Myalgia, other site: Secondary | ICD-10-CM | POA: Diagnosis not present

## 2022-01-02 DIAGNOSIS — R82998 Other abnormal findings in urine: Secondary | ICD-10-CM | POA: Diagnosis not present

## 2022-01-02 DIAGNOSIS — M719 Bursopathy, unspecified: Secondary | ICD-10-CM | POA: Diagnosis not present

## 2022-01-02 DIAGNOSIS — G8929 Other chronic pain: Secondary | ICD-10-CM | POA: Diagnosis not present

## 2022-01-02 DIAGNOSIS — M810 Age-related osteoporosis without current pathological fracture: Secondary | ICD-10-CM | POA: Diagnosis not present

## 2022-01-02 DIAGNOSIS — M5380 Other specified dorsopathies, site unspecified: Secondary | ICD-10-CM | POA: Diagnosis not present

## 2022-01-02 DIAGNOSIS — Z0000 Encounter for general adult medical examination without abnormal findings: Secondary | ICD-10-CM | POA: Diagnosis not present

## 2022-01-02 DIAGNOSIS — N3281 Overactive bladder: Secondary | ICD-10-CM | POA: Diagnosis not present

## 2022-01-02 DIAGNOSIS — K219 Gastro-esophageal reflux disease without esophagitis: Secondary | ICD-10-CM | POA: Diagnosis not present

## 2022-01-02 DIAGNOSIS — E039 Hypothyroidism, unspecified: Secondary | ICD-10-CM | POA: Diagnosis not present

## 2022-01-02 DIAGNOSIS — G43009 Migraine without aura, not intractable, without status migrainosus: Secondary | ICD-10-CM | POA: Diagnosis not present

## 2022-01-02 DIAGNOSIS — M47812 Spondylosis without myelopathy or radiculopathy, cervical region: Secondary | ICD-10-CM | POA: Diagnosis not present

## 2022-01-02 DIAGNOSIS — E663 Overweight: Secondary | ICD-10-CM | POA: Diagnosis not present

## 2022-01-02 DIAGNOSIS — Z981 Arthrodesis status: Secondary | ICD-10-CM | POA: Diagnosis not present

## 2022-01-02 DIAGNOSIS — G4733 Obstructive sleep apnea (adult) (pediatric): Secondary | ICD-10-CM | POA: Diagnosis not present

## 2022-01-06 ENCOUNTER — Encounter: Payer: Self-pay | Admitting: Psychiatry

## 2022-01-06 ENCOUNTER — Ambulatory Visit (INDEPENDENT_AMBULATORY_CARE_PROVIDER_SITE_OTHER): Payer: Medicare Other | Admitting: Psychiatry

## 2022-01-06 DIAGNOSIS — F411 Generalized anxiety disorder: Secondary | ICD-10-CM | POA: Diagnosis not present

## 2022-01-06 NOTE — Progress Notes (Signed)
[facility] Counselor/Therapist Progress Note  Patient ID: [name], MRN: [id number],    Date: 01/06/2022  Time Spent: 55 minutes   Treatment Type: Individual Therapy  Reported Symptoms: anxiety is "main symptom", and some depression, discouraged  Mental Status Exam:  Appearance:   Casual     Behavior:  Appropriate, Sharing, and Motivated  Motor:  Normal  Speech/Language:   Clear and Coherent  Affect:  anxious  Mood:  anxious  Thought process:  goal directed  Thought content:    WNL  Sensory/Perceptual disturbances:    WNL  Orientation:  oriented to person, place, time/date, situation, day of week, month of year, year, and stated date of Nov. 6, 2023  Attention:  Good  Concentration:  Good  Memory:  WNL  Fund of knowledge:   Good  Insight:    Good  Judgment:   Good  Impulse Control:  Fair (with shopping)   Risk Assessment: Danger to Self:  No Self-injurious Behavior: No Danger to Others: No Duty to Warn:no Physical Aggression / Violence:No  Access to Firearms a concern: No  Gang Involvement:No   Subjective: Patient in reporting anxiety as her main symptom along with some depression. Still recovering from her knee surgery and challenges as she heals. Discouraged re: issues within family and "getting used". (Not all details include in this note due to patient privacy needs.)  Processed issues re: family at length which seemed to help patient sort through her thoughts, emphasize her strengths, and set appropriate boundaries with others within family.  Husband seems to be having some dementia and has been tested through the [place], and patient needed time in session today to share and talk about concerns she has for husband's mental status and challenges. Concerned about husband ,and states it has been impacting her discouragement. Did seem to feel more understood and not as overwhelmed by circumstances. Continues healing from loss of minister who left church and town  rather quickly without much explanation. "Trying to stay in the present more versus in the past, continues water therapy that Dr recommended at gym, get outside daily, remaining on her meds and staying in contact with friends at her church.  Interventions: Cognitive Behavioral Therapy and Ego-Supportive  Long term goal: Develop healthy cognitive patterns and beliefs about self and the world that lead to alleviation of depression and anxiety, and help prevent relapse of depression and anxiety. Short term goal: Learn and implement personal skills for managing stress, solving daily problems, and resolving conflicts effectively.  Strategies: Use modeling and role-playing to help recognize anxious/depressive/negative thought patterns that create anxious/depressive/negative feelings and actions, interrupt them and replace with more positive reality-based thoughts that do not support depression   Diagnosis:   ICD-10-CM   1. Generalized anxiety disorder  F41.1      Plan:  Patient today actively involved in session working through some of her anxiety and depression. Very well connected at her church and involved in multiple activities/ministries there. Working to get better at "letting go" when she needs to . Able to sometimes interrupt her own "over-worrying or imagining worst case scenarios".  Shares how she still "worries some about aging" but trying not to dwell on it. Review of goal directed behaviors with patient and she shows good determination and efforts. Encourage patient in her practice of positive behaviors as discussed in sessions including: Interrupt anxious/depressive thoughts and challenging them to replace with more supportive/realistic thoughts, continue working on her depression/anxiety/fears of the unknown,  believe more in herself that she can make significant positive changes and feel better about herself and her situation, getting outside daily especially with her dog which is very  therapeutic for her, staying in contact with people who are supportive, stay in the present and focused on what she can control or change, remain involved in church activities that she and husband enjoyed together, allow her faith to be a healing resource for her emotionally, saying no when she needs to say no, use of healthy boundaries with others, set appropriate limits within her family as needed, positive self talk, and realize the strength she shows working with goal-directed behaviors to move in a direction that supports her improved emotional health and overall wellbeing.   Goal review and progress/challenges noted with patient.  Next appointment within 2 to 3 weeks.  This record has been created using [employer].  Chart creation errors have been sought, but may not always have been located and corrected.  Such creation errors do not reflect on the standard of medical care provided.   Shanon Ace, LCSW

## 2022-01-07 ENCOUNTER — Ambulatory Visit (HOSPITAL_BASED_OUTPATIENT_CLINIC_OR_DEPARTMENT_OTHER): Payer: Medicare Other | Admitting: Physical Therapy

## 2022-01-07 ENCOUNTER — Encounter (HOSPITAL_BASED_OUTPATIENT_CLINIC_OR_DEPARTMENT_OTHER): Payer: Self-pay | Admitting: Physical Therapy

## 2022-01-07 DIAGNOSIS — M6281 Muscle weakness (generalized): Secondary | ICD-10-CM | POA: Diagnosis not present

## 2022-01-07 DIAGNOSIS — M25662 Stiffness of left knee, not elsewhere classified: Secondary | ICD-10-CM | POA: Diagnosis not present

## 2022-01-07 DIAGNOSIS — M25562 Pain in left knee: Secondary | ICD-10-CM | POA: Diagnosis not present
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
## 2022-01-09 ENCOUNTER — Encounter (HOSPITAL_BASED_OUTPATIENT_CLINIC_OR_DEPARTMENT_OTHER): Payer: Self-pay | Admitting: Physical Therapy

## 2022-01-09 ENCOUNTER — Ambulatory Visit (HOSPITAL_BASED_OUTPATIENT_CLINIC_OR_DEPARTMENT_OTHER): Payer: Medicare Other | Admitting: Physical Therapy

## 2022-01-09 DIAGNOSIS — M25662 Stiffness of left knee, not elsewhere classified: Secondary | ICD-10-CM

## 2022-01-09 DIAGNOSIS — M25562 Pain in left knee: Secondary | ICD-10-CM | POA: Diagnosis not present

## 2022-01-09 DIAGNOSIS — M6281 Muscle weakness (generalized): Secondary | ICD-10-CM
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
## 2022-01-13 ENCOUNTER — Other Ambulatory Visit (HOSPITAL_COMMUNITY): Payer: Self-pay | Admitting: *Deleted

## 2022-01-15 ENCOUNTER — Encounter (HOSPITAL_COMMUNITY)
Admission: RE | Admit: 2022-01-15 | Discharge: 2022-01-15 | Disposition: A | Discharge status: Home / Self Care | Payer: Medicare Other | Source: Ambulatory Visit | Attending: Internal Medicine | Admitting: Internal Medicine

## 2022-01-15 DIAGNOSIS — M810 Age-related osteoporosis without current pathological fracture: Secondary | ICD-10-CM | POA: Diagnosis not present

## 2022-01-15 MED ORDER — DENOSUMAB 60 MG/ML ~~LOC~~ SOSY
PREFILLED_SYRINGE | SUBCUTANEOUS | Status: AC
  Filled 2022-01-15: qty 1

## 2022-01-15 MED ORDER — DENOSUMAB 60 MG/ML ~~LOC~~ SOSY
60.0000 mg | PREFILLED_SYRINGE | Freq: Once | SUBCUTANEOUS | Status: AC
  Administered 2022-01-15: 60 mg via SUBCUTANEOUS

## 2022-01-16 ENCOUNTER — Ambulatory Visit (HOSPITAL_BASED_OUTPATIENT_CLINIC_OR_DEPARTMENT_OTHER): Payer: Medicare Other | Admitting: Physical Therapy

## 2022-01-16 ENCOUNTER — Encounter (HOSPITAL_BASED_OUTPATIENT_CLINIC_OR_DEPARTMENT_OTHER): Payer: Self-pay | Admitting: Physical Therapy

## 2022-01-16 DIAGNOSIS — M25562 Pain in left knee: Secondary | ICD-10-CM | POA: Diagnosis not present

## 2022-01-16 DIAGNOSIS — M6281 Muscle weakness (generalized): Secondary | ICD-10-CM | POA: Diagnosis not present

## 2022-01-16 DIAGNOSIS — M25662 Stiffness of left knee, not elsewhere classified: Secondary | ICD-10-CM | POA: Diagnosis not present
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
## 2022-01-19 ENCOUNTER — Encounter (HOSPITAL_BASED_OUTPATIENT_CLINIC_OR_DEPARTMENT_OTHER): Payer: Self-pay | Admitting: Physical Therapy

## 2022-01-19 ENCOUNTER — Ambulatory Visit (HOSPITAL_BASED_OUTPATIENT_CLINIC_OR_DEPARTMENT_OTHER): Payer: Medicare Other | Admitting: Physical Therapy

## 2022-01-19 DIAGNOSIS — M6281 Muscle weakness (generalized): Secondary | ICD-10-CM

## 2022-01-19 DIAGNOSIS — M25562 Pain in left knee: Secondary | ICD-10-CM

## 2022-01-19 DIAGNOSIS — M25662 Stiffness of left knee, not elsewhere classified: Secondary | ICD-10-CM
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
## 2022-01-20 ENCOUNTER — Ambulatory Visit (INDEPENDENT_AMBULATORY_CARE_PROVIDER_SITE_OTHER): Payer: Medicare Other | Admitting: Neurology

## 2022-01-20 DIAGNOSIS — G4733 Obstructive sleep apnea (adult) (pediatric): Secondary | ICD-10-CM

## 2022-01-20 DIAGNOSIS — I493 Ventricular premature depolarization: Secondary | ICD-10-CM

## 2022-01-20 DIAGNOSIS — G894 Chronic pain syndrome: Secondary | ICD-10-CM

## 2022-01-20 DIAGNOSIS — J300 Vasomotor rhinitis: Secondary | ICD-10-CM

## 2022-01-20 DIAGNOSIS — R0683 Snoring: Secondary | ICD-10-CM

## 2022-01-20 DIAGNOSIS — M170 Bilateral primary osteoarthritis of knee: Secondary | ICD-10-CM

## 2022-01-20 DIAGNOSIS — Z789 Other specified health status: Secondary | ICD-10-CM

## 2022-01-21 ENCOUNTER — Encounter (HOSPITAL_BASED_OUTPATIENT_CLINIC_OR_DEPARTMENT_OTHER): Payer: Self-pay | Admitting: Physical Therapy

## 2022-01-21 ENCOUNTER — Ambulatory Visit (HOSPITAL_BASED_OUTPATIENT_CLINIC_OR_DEPARTMENT_OTHER): Payer: Medicare Other | Admitting: Physical Therapy

## 2022-01-21 DIAGNOSIS — M6281 Muscle weakness (generalized): Secondary | ICD-10-CM | POA: Diagnosis not present

## 2022-01-21 DIAGNOSIS — M25562 Pain in left knee: Secondary | ICD-10-CM

## 2022-01-21 DIAGNOSIS — M25662 Stiffness of left knee, not elsewhere classified: Secondary | ICD-10-CM | POA: Diagnosis not present
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
## 2022-01-26 ENCOUNTER — Ambulatory Visit (HOSPITAL_BASED_OUTPATIENT_CLINIC_OR_DEPARTMENT_OTHER): Payer: Medicare Other | Admitting: Physical Therapy

## 2022-01-26 ENCOUNTER — Encounter (HOSPITAL_BASED_OUTPATIENT_CLINIC_OR_DEPARTMENT_OTHER): Payer: Self-pay | Admitting: Physical Therapy

## 2022-01-26 DIAGNOSIS — M25562 Pain in left knee: Secondary | ICD-10-CM

## 2022-01-26 DIAGNOSIS — R0683 Snoring: Secondary | ICD-10-CM | POA: Insufficient documentation

## 2022-01-26 DIAGNOSIS — M25662 Stiffness of left knee, not elsewhere classified: Secondary | ICD-10-CM | POA: Diagnosis not present

## 2022-01-26 DIAGNOSIS — M6281 Muscle weakness (generalized): Secondary | ICD-10-CM | POA: Diagnosis not present

## 2022-01-26 NOTE — Progress Notes (Signed)
IMPRESSION:  This HST confirms the presence of mild obstructive sleep apnea which is slightly REM accentuated and very much dependent on supine sleep position.  RECOMMENDATION: 1) Changing sleep position: If the patient can avoid sleeping in supine position, her overall apnea-hypopnea index would be around 8/h and would not require positive airway pressure therapy. Please note that also right lateral sleep was associated with a higher apnea index.  The most beneficial sleep position was on her left side with an AHI of 4.8/h- which was seen over 382 minutes.  2) Since snoring is still significantly present I would consider referring this patient for a dental device , to be made to order by a sleep specialized dentist.  Please have the patient call us if she would like to consider this step.

## 2022-01-26 NOTE — Progress Notes (Signed)
[facility] [place] TEST REPORT ( by Watch PAT)   STUDY DATA available as of today, 01-26-2022:   DOB:  January 09, 1951 MRN:    ORDERING CLINICIAN: Larey Seat, MD  REFERRING CLINICIAN: Dr Dagmar Hait, MD PhD   CLINICAL INFORMATION/HISTORY: patient reports being intolerant of CPAP. Anxiety disorder.  12/04/2021 from Dr Dagmar Hait / Dr.  for a new sleep evaluation.  Chief concern according to patient : I the pleasure of seeing Harley B. Rosen today and meanwhile 71 year old Caucasian lady who had an early 2018 being worked up in our practice for a sleep disorder at that time.  Limb movements-restless legs, migraines and apnea.  She had always a lower extremity dysesthesia component she also went off into the bathroom at the time but frequent nocturia, she was familiar with CPAP as her husband used it.  She endorsed the Epworth sleepiness scale in 2018 at 15 points, her overall AHI was 10.5 an hour but her REM sleep AHI was 58.4/h and in supine sleep her AHI was 46.9/h there were clearly positional and REM dependent apneas noted.  And when she returned for CPAP titration she did very well at 6 , 7 and 8 cm water pressures.  However she could not easily tolerate the auto titration device and she was prescribed an AirFit P10 nasal pillow at the time and the from there on we did not have follow-up for a while.       Epworth sleepiness score: 6/24. FSS at 15/63 points. GDS 3/ 15 points.    BMI: 26.4 kg/m  Neck Circumference: 14"   FINDINGS:   Sleep Summary:   Total Recording Time (hours, min):   10 hours 29 minutes  Total Sleep Time (hours, min):              10 hours (!) Percent REM (%): 31%                                      Respiratory Indices:   Calculated pAHI (per hour): 14.2/h                           REM pAHI:    17/h                                             NREM pAHI: 13/h                            Supine AHI: 44/h-supine sleep was recorded 401-minute, nonsupine  sleep recorded for 499 minutes with an AHI of 8.1/h. Snoring reached a mean volume of 42 dB and was present for over 40% of total sleep time.                                                 Oxygen Saturation Statistics:   O2 Saturation Range (%):   Between a nadir at 85 and a maximum of 98% saturation with a mean saturation at 93%.  O2 Saturation (minutes) <89%: 0.1 minutes         Pulse Rate Statistics:   Pulse Mean (bpm): 72 bpm                Pulse Range:   Between 59 and 97 bpm              IMPRESSION:  This HST confirms the presence of mild obstructive sleep apnea which is slightly REM accentuated and very much dependent on supine sleep position.   RECOMMENDATION: If the patient can avoid sleeping in supine her overall apnea-hypopnea index would be around 8/h and would not require positive airway pressure therapy. Please note that also right lateral sleep was associated with a higher apnea index.  The most beneficial sleep position was on her left side with an AHI of 4.8/h- which was seen over 382 minutes.  Since snoring is still significantly present I would consider referring this patient for a dental device , to be made to order by a sleep specialized dentist.    INTERPRETING PHYSICIAN:   Larey Seat, MD   Medical Director of [facility] at [facility].

## 2022-01-26 NOTE — Procedures (Signed)
[facility] [place] TEST REPORT ( by Watch PAT)   STUDY DATA available as of today, 01-26-2022:   DOB:  10/14/1950 MRN:    ORDERING CLINICIAN: Larey Seat, MD  REFERRING CLINICIAN: Dr Dagmar Hait, MD PhD   CLINICAL INFORMATION/HISTORY: patient reports being intolerant of CPAP. Anxiety disorder.  12/04/2021 from Dr Dagmar Hait / Dr.  for a new sleep evaluation.  Chief concern according to patient : I the pleasure of seeing [name] today and meanwhile 71 year old Caucasian lady who had an early 2018 being worked up in our practice for a sleep disorder at that time.  Limb movements-restless legs, migraines and apnea.  She had always a lower extremity dysesthesia component she also went off into the bathroom at the time but frequent nocturia, she was familiar with CPAP as her husband used it.  She endorsed the Epworth sleepiness scale in 2018 at 15 points, her overall AHI was 10.5 an hour but her REM sleep AHI was 58.4/h and in supine sleep her AHI was 46.9/h there were clearly positional and REM dependent apneas noted.  And when she returned for CPAP titration she did very well at 6 , 7 and 8 cm water pressures.  However she could not easily tolerate the auto titration device and she was prescribed an AirFit P10 nasal pillow at the time and the from there on we did not have follow-up for a while.       Epworth sleepiness score: 6/24. FSS at 15/63 points. GDS 3/ 15 points.    BMI: 26.4 kg/m  Neck Circumference: 14"   FINDINGS:   Sleep Summary:   Total Recording Time (hours, min):   10 hours 29 minutes  Total Sleep Time (hours, min):              10 hours (!) Percent REM (%): 31%                                      Respiratory Indices:   Calculated pAHI (per hour): 14.2/h                           REM pAHI:    17/h                                             NREM pAHI: 13/h                            Supine AHI: 44/h-supine sleep was recorded 401-minute, nonsupine sleep  recorded for 499 minutes with an AHI of 8.1/h. Snoring reached a mean volume of 42 dB and was present for over 40% of total sleep time.                                                 Oxygen Saturation Statistics:   O2 Saturation Range (%):   Between a nadir at 85 and a maximum of 98% saturation with a mean saturation at 93%.  O2 Saturation (minutes) <89%: 0.1 minutes         Pulse Rate Statistics:   Pulse Mean (bpm): 72 bpm                Pulse Range:   Between 59 and 97 bpm              IMPRESSION:  This HST confirms the presence of mild obstructive sleep apnea which is slightly REM accentuated and very much dependent on supine sleep position.   RECOMMENDATION: If the patient can avoid sleeping in supine her overall apnea-hypopnea index would be around 8/h and would not require positive airway pressure therapy. Please note that also right lateral sleep was associated with a higher apnea index.  The most beneficial sleep position was on her left side with an AHI of 4.8/h- which was seen over 382 minutes.  Since snoring is still significantly present I would consider referring this patient for a dental device , to be made to order by a sleep specialized dentist.    INTERPRETING PHYSICIAN:   Larey Seat, MD   Medical Director of [facility] at [facility].

## 2022-01-27 ENCOUNTER — Telehealth: Payer: Self-pay

## 2022-01-27 ENCOUNTER — Ambulatory Visit (INDEPENDENT_AMBULATORY_CARE_PROVIDER_SITE_OTHER): Payer: Medicare Other | Admitting: Psychiatry

## 2022-01-27 DIAGNOSIS — F411 Generalized anxiety disorder: Secondary | ICD-10-CM

## 2022-01-27 NOTE — Telephone Encounter (Signed)
I called patient.  I discussed her sleep study results and recommendations.  Patient will sleep on her left side.  Patient will avoid supine and right lateral sleep.  She will discuss a dental device with her dentist and call us if her dentist is unable to make this.  Patient verbalized understanding of results.  Patient had no questions or concerns at this time.

## 2022-01-27 NOTE — Telephone Encounter (Signed)
-----   Message from Larey Seat, MD sent at 01/26/2022  3:29 PM EST ----- IMPRESSION:  This HST confirms the presence of mild obstructive sleep apnea which is slightly REM accentuated and very much dependent on supine sleep position.   RECOMMENDATION: 1) Changing sleep position: If the patient can avoid sleeping in supine position, her overall apnea-hypopnea index would be around 8/h and would not require positive airway pressure therapy. Please note that also right lateral sleep was associated with a higher apnea index.  The most beneficial sleep position was on her left side with an AHI of 4.8/h- which was seen over 382 minutes.   2) Since snoring is still significantly present I would consider referring this patient for a dental device , to be made to order by a sleep specialized dentist.  Please have the patient call us if she would like to consider this step.

## 2022-01-27 NOTE — Progress Notes (Signed)
[facility] Counselor/Therapist Progress Note  Patient ID: [name], MRN: [id number],    Date: 01/27/2022  Time Spent: 50 minutes   Treatment Type: Individual Therapy  Reported Symptoms: anxiety, depression ("anxiety is the stronger symptom")  Mental Status Exam:  Appearance:   Neat     Behavior:  Appropriate, Sharing, and Motivated  Motor:  Normal  Speech/Language:   Clear and Coherent  Affect:  anxious  Mood:  anxious and depressed  Thought process:  goal directed  Thought content:    WNL  Sensory/Perceptual disturbances:    WNL  Orientation:  oriented to person, place, time/date, situation, day of week, month of year, year, and stated date of Nov. 28, 2023  Attention:  Good  Concentration:  Good  Memory:  WNL Tested and "was ok"  Fund of knowledge:   Good  Insight:    Good  Judgment:   Good  Impulse Control:  Good   Risk Assessment: Danger to Self:  No Self-injurious Behavior: No Danger to Others: No Duty to Warn:no Physical Aggression / Violence:No  Access to Firearms a concern: No  Gang Involvement:No   Subjective:  Patient in today and reports anxiety as stronger symptom and also some depression. Reports knee is still healing although with pain after replacement surgery. Concerned about her anxiety recently and noticed "it went away at times quicker than I would have thought" and "am trying to remember that for the future when I'm worried or anxious." Working on her anxiety today especially re: family/extended family stress, health issues for patient and her husband. Feels husband is doing some better but is short-tempered at times of change or increased stress which often affects their communicating with each other. States she tried to stay more in the present rather than worrying about future and that is helpful when she is able to do that. Staying connected with friends.  Interventions: Cognitive Behavioral Therapy and Ego-Supportive  Long term  goal: Develop healthy cognitive patterns and beliefs about self and the world that lead to alleviation of depression and anxiety, and help prevent relapse of depression and anxiety. Short term goal: Learn and implement personal skills for managing stress, solving daily problems, and resolving conflicts effectively.  Strategies: Use modeling and role-playing to help recognize anxious/depressive/negative thought patterns that create anxious/depressive/negative feelings and actions, interrupt them and replace with more positive reality-based thoughts that do not support depression  Diagnosis:   ICD-10-CM   1. Generalized anxiety disorder  F41.1      Plan:  Patient today actively participating in session as she works further on her anxiety/depression related to personal and family situations, and within her marriage.  Patient making progress despite challenges especially within family. Needs continued treatment to work with goal-directed behaviors and make further progress in management of anxiety and depression. Encouraged patient in practicing positive behaviors as noted in session including: Work to interrupt anxious/depressive thoughts and challenging them to replace with more supportive/realistic thoughts, continue working on her depression/anxiety/fears of the unknown, believe more in herself that she can make significant positive changes and feel better about her situation, getting outside daily especially with her dog which is very therapeutic for her, remain in contact with people who are supportive, remain in the present focusing on what she can change her control, stay involved in church activities that she and husband enjoyed together, allow her faith to be a healing resource for her emotionally, saying no when she needs to say no, healthy  boundaries with others, use appropriate limits within her family as needed, positive self talk, and recognize the strength she is showing as she works with  goal-directed behaviors to move in a direction that supports her improved emotional health.  Goal review and progress/challenges noted with patient.  Next appointment within 2 to 3 weeks.  This record has been created using [employer].  Chart creation errors have been sought, but may not always have been located and corrected.  Such creation errors do not reflect on the standard of medical care provided.   Shanon Ace, LCSW

## 2022-01-29 ENCOUNTER — Ambulatory Visit (HOSPITAL_BASED_OUTPATIENT_CLINIC_OR_DEPARTMENT_OTHER): Payer: Medicare Other | Admitting: Physical Therapy

## 2022-01-29 DIAGNOSIS — M25562 Pain in left knee: Secondary | ICD-10-CM | POA: Diagnosis not present

## 2022-01-29 DIAGNOSIS — M25662 Stiffness of left knee, not elsewhere classified: Secondary | ICD-10-CM | POA: Diagnosis not present

## 2022-01-29 DIAGNOSIS — M6281 Muscle weakness (generalized): Secondary | ICD-10-CM | POA: Diagnosis not present
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
## 2022-01-30 ENCOUNTER — Encounter (HOSPITAL_BASED_OUTPATIENT_CLINIC_OR_DEPARTMENT_OTHER): Payer: Self-pay | Admitting: Physical Therapy

## 2022-02-02 ENCOUNTER — Ambulatory Visit (HOSPITAL_BASED_OUTPATIENT_CLINIC_OR_DEPARTMENT_OTHER): Payer: Medicare Other | Attending: Orthopedic Surgery | Admitting: Physical Therapy

## 2022-02-02 ENCOUNTER — Encounter (HOSPITAL_BASED_OUTPATIENT_CLINIC_OR_DEPARTMENT_OTHER): Payer: Self-pay | Admitting: Physical Therapy

## 2022-02-02 ENCOUNTER — Ambulatory Visit (HOSPITAL_BASED_OUTPATIENT_CLINIC_OR_DEPARTMENT_OTHER): Payer: Medicare Other | Admitting: Physical Therapy

## 2022-02-02 DIAGNOSIS — M25662 Stiffness of left knee, not elsewhere classified: Secondary | ICD-10-CM

## 2022-02-02 DIAGNOSIS — M25562 Pain in left knee: Secondary | ICD-10-CM | POA: Diagnosis not present

## 2022-02-02 DIAGNOSIS — M6281 Muscle weakness (generalized): Secondary | ICD-10-CM | POA: Diagnosis not present
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
## 2022-02-05 ENCOUNTER — Ambulatory Visit (HOSPITAL_BASED_OUTPATIENT_CLINIC_OR_DEPARTMENT_OTHER): Payer: Medicare Other | Admitting: Physical Therapy

## 2022-02-08 ENCOUNTER — Other Ambulatory Visit: Payer: Self-pay | Admitting: Psychiatry

## 2022-02-08 DIAGNOSIS — F411 Generalized anxiety disorder: Secondary | ICD-10-CM
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
## 2022-02-09 ENCOUNTER — Ambulatory Visit (HOSPITAL_BASED_OUTPATIENT_CLINIC_OR_DEPARTMENT_OTHER): Payer: Medicare Other | Admitting: Physical Therapy

## 2022-02-09 DIAGNOSIS — M25562 Pain in left knee: Secondary | ICD-10-CM | POA: Diagnosis not present

## 2022-02-09 DIAGNOSIS — M25662 Stiffness of left knee, not elsewhere classified: Secondary | ICD-10-CM

## 2022-02-09 DIAGNOSIS — M6281 Muscle weakness (generalized): Secondary | ICD-10-CM | POA: Diagnosis not present

## 2022-02-10 ENCOUNTER — Encounter (HOSPITAL_BASED_OUTPATIENT_CLINIC_OR_DEPARTMENT_OTHER): Payer: Self-pay | Admitting: Physical Therapy

## 2022-02-12 ENCOUNTER — Ambulatory Visit (HOSPITAL_BASED_OUTPATIENT_CLINIC_OR_DEPARTMENT_OTHER): Payer: Medicare Other | Admitting: Physical Therapy

## 2022-02-12 ENCOUNTER — Ambulatory Visit: Payer: Medicare Other | Admitting: Psychiatry

## 2022-02-17 ENCOUNTER — Ambulatory Visit (INDEPENDENT_AMBULATORY_CARE_PROVIDER_SITE_OTHER): Payer: Medicare Other | Admitting: Psychiatry

## 2022-02-17 DIAGNOSIS — S50812A Abrasion of left forearm, initial encounter: Secondary | ICD-10-CM | POA: Diagnosis not present

## 2022-02-17 DIAGNOSIS — F411 Generalized anxiety disorder: Secondary | ICD-10-CM

## 2022-02-17 DIAGNOSIS — M25562 Pain in left knee: Secondary | ICD-10-CM | POA: Diagnosis not present

## 2022-02-17 NOTE — Progress Notes (Deleted)
      [facility] Counselor/Therapist Progress Note  Patient ID: [name], MRN: [id number],    Date: 02/17/2022  Time Spent: ***   Treatment Type: {CHL AMB THERAPY TYPES:[id number]}  Reported Symptoms: ***  Mental Status Exam:  Appearance:   {PSY:22683}     Behavior:  {PSY:21022743}  Motor:  {PSY:22302}  Speech/Language:   {PSY:22685}  Affect:  {PSY:22687}  Mood:  {PSY:31886}  Thought process:  {PSY:31888}  Thought content:    {[phone]}  Sensory/Perceptual disturbances:    {[phone]}  Orientation:  {PSY:30297}  Attention:  {PSY:22877}  Concentration:  {PSY:916-721-3892}  Memory:  {[phone]}  Fund of knowledge:   {PSY:916-721-3892}  Insight:    {PSY:916-721-3892}  Judgment:   {PSY:916-721-3892}  Impulse Control:  {PSY:916-721-3892}   Risk Assessment: Danger to Self:  {PSY:22692} Self-injurious Behavior: {PSY:22692} Danger to Others: {PSY:22692} Duty to Warn:{PSY:311194} Physical Aggression / Violence:{PSY:21197} Access to Firearms a concern: {PSY:21197} Gang Involvement:{PSY:21197}  Subjective: ***   Interventions: {[phone]}  Diagnosis:No diagnosis found.  Plan: ***  Shanon Ace, LCSW

## 2022-02-17 NOTE — Progress Notes (Signed)
[facility] Counselor/Therapist Progress Note  Patient ID: [name], MRN: [id number],    Date: 02/17/2022  Time Spent: 55 minutes   Treatment Type: Individual Therapy  Reported Symptoms: anxiety, stressed, "family issues increased recently"  Mental Status Exam:  Appearance:   Casual and Neat     Behavior:  Appropriate, Sharing, and Motivated  Motor:  Normal  Speech/Language:   Clear and Coherent  Affect:  anxious  Mood:  anxious and stressed  Thought process:  goal directed  Thought content:    WNL  Sensory/Perceptual disturbances:    WNL  Orientation:  oriented to person, place, time/date, situation, day of week, month of year, year, and stated date of Dec. 19, 2023  Attention:  Good  Concentration:  Good  Memory:  Some short term memory issues and Dr. Is aware  Fund of knowledge:   Good  Insight:    Good and Fair  Judgment:   Good and Fair  Impulse Control:  Good and Fair   Risk Assessment: Danger to Self:  No Self-injurious Behavior: No Danger to Others: No Duty to Warn:no Physical Aggression / Violence:No  Access to Firearms a concern: No  Gang Involvement:No   Subjective:  Patient in today reporting  anxiety and feeling stressed, mostly related to family and her adult children. Processed a lot of feelings held inside regarding personal and family issues of concern. Discussed these issues in more depth today and seemed to feel better about herself and how she has managed some of these concerns more recently and better able to consider healthier boundaries without feeling guilty. Did well in session today venting her concerns and in making some decisions that would support better boundaries within family and also support patient's own positive mental health.  Practiced some of the changes that we spoke about her using in terms of conversations within the family, setting more effective boundaries, and choosing to let go of some things.  Does acknowledge that when  she is able to stay in the present rather than worrying about the future, she "does better emotionally ".  Interventions: Cognitive Behavioral Therapy, Solution-Oriented/Positive Psychology, and Ego-Supportive  Long term goal: Develop healthy cognitive patterns and beliefs about self and the world that lead to alleviation of depression and anxiety, and help prevent relapse of depression and anxiety. Short term goal: Learn and implement personal skills for managing stress, solving daily problems, and resolving conflicts effectively.  Strategies: Use modeling and role-playing to help recognize anxious/depressive/negative thought patterns that create anxious/depressive/negative feelings and actions, interrupt them and replace with more positive reality-based thoughts that do not support depression   Diagnosis:   ICD-10-CM   1. Generalized anxiety disorder  F41.1      Plan: Patient actively participating and showing good motivation in session today as she was very open about some recent escalation of family issues and what her concerns have been.  As noted above she worked very actively in session helping to clarify the concerns involved and worked on improved boundary setting without guilt in addition to being able to "let go" of some things and hopefully reduce her worrying going forward.  Seem to have more confidence today and not as shaken when she tries to make appropriate decisions in the moment when it involves family interactions.  Continues to practice improved self talk.  Seeing some definite progress, and also seeing more of the challenges within the family most recently.  Needs continued treatment as she works with  goal-directed behaviors to progress in her setting of appropriate boundaries in addition to more consistent and better management of anxiety and depression in her daily life. Encouraged patient in her practice of positive behaviors as noted in session including: Interrupt  anxious/depressive thoughts and challenge them to replace with more supportive/realistic thoughts, continue working on her depression/anxiety/fears of the unknown, believe more in herself that she can make significant positive changes and feel better about her future, get outside daily especially with her dog which is very therapeutic for her, remain in contact with people who are supportive, stay in the present focusing on what she can control or change, remain involved in church activities that she and husband enjoyed together, allow her faith to be a healing resource for her emotionally, saying no when she needs to say no, healthy boundaries with others, use appropriate limits within her family as needed, positive self talk, and realize the strength she shows as she works with goal-directed behaviors to move in a direction that supports her improved emotional health and overall wellbeing.  Goal review and progress/challenges noted with patient.  Next appointment within 3 weeks.  This record has been created using [employer].  Chart creation errors have been sought, but may not always have been located and corrected.  Such creation errors do not reflect on the standard of medical care provided.   Shanon Ace, LCSW

## 2022-02-18 ENCOUNTER — Ambulatory Visit (HOSPITAL_BASED_OUTPATIENT_CLINIC_OR_DEPARTMENT_OTHER): Payer: Medicare Other | Admitting: Physical Therapy

## 2022-02-19 ENCOUNTER — Encounter: Payer: Self-pay | Admitting: Psychiatry

## 2022-02-19 ENCOUNTER — Telehealth (INDEPENDENT_AMBULATORY_CARE_PROVIDER_SITE_OTHER): Payer: Medicare Other | Admitting: Psychiatry

## 2022-02-19 DIAGNOSIS — F330 Major depressive disorder, recurrent, mild: Secondary | ICD-10-CM | POA: Diagnosis not present

## 2022-02-19 DIAGNOSIS — F411 Generalized anxiety disorder: Secondary | ICD-10-CM

## 2022-02-19 DIAGNOSIS — F5101 Primary insomnia: Secondary | ICD-10-CM | POA: Diagnosis not present

## 2022-02-19 MED ORDER — DULOXETINE HCL 60 MG PO CPEP: 60.0000 mg | ORAL_CAPSULE | Freq: Every day | ORAL | 1 refills | Status: DC

## 2022-02-19 MED ORDER — TRAZODONE HCL 50 MG PO TABS: 25.0000 mg | ORAL_TABLET | Freq: Every day | ORAL | 1 refills | Status: DC

## 2022-02-19 MED ORDER — BUSPIRONE HCL 10 MG PO TABS: 10.0000 mg | ORAL_TABLET | Freq: Two times a day (BID) | ORAL | 1 refills | Status: DC

## 2022-02-19 MED ORDER — LAMOTRIGINE 25 MG PO TABS: 50.0000 mg | ORAL_TABLET | Freq: Every day | ORAL | 1 refills | Status: DC
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
## 2022-02-20 ENCOUNTER — Ambulatory Visit (HOSPITAL_BASED_OUTPATIENT_CLINIC_OR_DEPARTMENT_OTHER): Payer: Medicare Other | Admitting: Physical Therapy

## 2022-02-25 ENCOUNTER — Ambulatory Visit (HOSPITAL_BASED_OUTPATIENT_CLINIC_OR_DEPARTMENT_OTHER): Payer: Medicare Other | Admitting: Physical Therapy

## 2022-02-25 DIAGNOSIS — M47812 Spondylosis without myelopathy or radiculopathy, cervical region: Secondary | ICD-10-CM | POA: Diagnosis not present

## 2022-03-09 ENCOUNTER — Ambulatory Visit (INDEPENDENT_AMBULATORY_CARE_PROVIDER_SITE_OTHER): Payer: Medicare Other | Admitting: Psychiatry

## 2022-03-09 DIAGNOSIS — F411 Generalized anxiety disorder: Secondary | ICD-10-CM | POA: Diagnosis not present
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
## 2022-03-16 ENCOUNTER — Encounter (HOSPITAL_BASED_OUTPATIENT_CLINIC_OR_DEPARTMENT_OTHER): Payer: Self-pay | Admitting: Physical Therapy

## 2022-03-16 ENCOUNTER — Ambulatory Visit (HOSPITAL_BASED_OUTPATIENT_CLINIC_OR_DEPARTMENT_OTHER): Payer: Medicare Other | Attending: Orthopedic Surgery | Admitting: Physical Therapy

## 2022-03-16 DIAGNOSIS — M25562 Pain in left knee: Secondary | ICD-10-CM | POA: Diagnosis not present

## 2022-03-16 DIAGNOSIS — M6281 Muscle weakness (generalized): Secondary | ICD-10-CM | POA: Insufficient documentation

## 2022-03-16 DIAGNOSIS — M25662 Stiffness of left knee, not elsewhere classified: Secondary | ICD-10-CM | POA: Diagnosis not present
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
## 2022-03-17 ENCOUNTER — Other Ambulatory Visit: Payer: Self-pay | Admitting: Psychiatry

## 2022-03-17 DIAGNOSIS — F5101 Primary insomnia: Secondary | ICD-10-CM

## 2022-03-17 DIAGNOSIS — F411 Generalized anxiety disorder: Secondary | ICD-10-CM

## 2022-03-20 ENCOUNTER — Other Ambulatory Visit: Payer: Self-pay

## 2022-03-20 ENCOUNTER — Telehealth: Payer: Self-pay | Admitting: Psychiatry

## 2022-03-20 DIAGNOSIS — F5101 Primary insomnia: Secondary | ICD-10-CM

## 2022-03-20 DIAGNOSIS — F411 Generalized anxiety disorder: Secondary | ICD-10-CM

## 2022-03-20 MED ORDER — LORAZEPAM 1 MG PO TABS: ORAL_TABLET | ORAL | 5 refills | Status: DC

## 2022-03-20 NOTE — Telephone Encounter (Signed)
Pended.

## 2022-03-20 NOTE — Telephone Encounter (Signed)
Pt called and said that she needs a refill on her lorazapam 1 mg she takes 2 at night. [facility] on [address].

## 2022-03-25 ENCOUNTER — Encounter (HOSPITAL_BASED_OUTPATIENT_CLINIC_OR_DEPARTMENT_OTHER): Payer: Self-pay | Admitting: Physical Therapy

## 2022-03-25 ENCOUNTER — Ambulatory Visit (HOSPITAL_BASED_OUTPATIENT_CLINIC_OR_DEPARTMENT_OTHER): Payer: Medicare Other | Admitting: Physical Therapy

## 2022-03-25 DIAGNOSIS — M25662 Stiffness of left knee, not elsewhere classified: Secondary | ICD-10-CM | POA: Diagnosis not present

## 2022-03-25 DIAGNOSIS — M6281 Muscle weakness (generalized): Secondary | ICD-10-CM

## 2022-03-25 DIAGNOSIS — M25562 Pain in left knee: Secondary | ICD-10-CM | POA: Diagnosis not present
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
## 2022-03-30 ENCOUNTER — Encounter (HOSPITAL_BASED_OUTPATIENT_CLINIC_OR_DEPARTMENT_OTHER): Payer: Self-pay | Admitting: Physical Therapy

## 2022-03-30 ENCOUNTER — Ambulatory Visit (HOSPITAL_BASED_OUTPATIENT_CLINIC_OR_DEPARTMENT_OTHER): Payer: Medicare Other | Admitting: Physical Therapy

## 2022-03-30 DIAGNOSIS — M6281 Muscle weakness (generalized): Secondary | ICD-10-CM | POA: Diagnosis not present

## 2022-03-30 DIAGNOSIS — M25562 Pain in left knee: Secondary | ICD-10-CM | POA: Diagnosis not present

## 2022-03-30 DIAGNOSIS — M25662 Stiffness of left knee, not elsewhere classified: Secondary | ICD-10-CM
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
## 2022-03-31 ENCOUNTER — Ambulatory Visit (INDEPENDENT_AMBULATORY_CARE_PROVIDER_SITE_OTHER): Payer: Medicare Other | Admitting: Psychiatry

## 2022-03-31 DIAGNOSIS — F330 Major depressive disorder, recurrent, mild: Secondary | ICD-10-CM | POA: Diagnosis not present

## 2022-03-31 NOTE — Progress Notes (Signed)
[facility] Counselor/Therapist Progress Note  Patient ID: [name], MRN: [id number],    Date: 03/31/2022  Time Spent: 55 minutes   Treatment Type: Individual Therapy  Reported Symptoms:          UPDATE  Mental Status Exam:  Appearance:   Casual and Neat     Behavior:  Appropriate, Sharing, and Motivated  Motor:  Normal  Speech/Language:   Clear and Coherent  Affect:  Depressed and anxious  Mood:  depressed  Thought process:  goal directed  Thought content:    Some ruminating  Sensory/Perceptual disturbances:    WNL  Orientation:  oriented to person, place, time/date, situation, day of week, month of year, year, and stated date of Jan. 30, 2024  Attention:  Good  Concentration:  Good  Memory:  Some short term memory issues  Fund of knowledge:   Good  Insight:    Good and Fair  Judgment:   Good and Fair  Impulse Control:  Good   Risk Assessment: Danger to Self:  No Self-injurious Behavior: No Danger to Others: No Duty to Warn:no Physical Aggression / Violence:No  Access to Firearms a concern: No  Gang Involvement:No   Subjective: Patient in today reporting depression, and anxiety which has diminished some.  Processed some sadness in her relationship with husband as she notices very gradually some of his decreased cognitive behaviors.  Patient also sees a positive and is feeling grateful for that.  Some of his decline has also impacted their decreased level of affection which patient was also discussing today in session and how this impacts her.  Feels positive about her memory improving some after a med adjustment "a while back and is now decreasing her Lamictal" with the support of her med provider.  Continue to work on communication issues with husband today, to help as husband has some hearing and memory challenges.  Depression related mostly to "sadness of not having the infection she used to receive in her marriage" and as noted above was able to process this  more in session which seemed helpful to her.  Does accentuate the positives in her marital relationship as well.  Less focused today on her "faults" as she was in last session.  Trying to stay more in the present versus the past.  Interventions: Cognitive Behavioral Therapy and Ego-Supportive  Long term goal: Develop healthy cognitive patterns and beliefs about self and the world that lead to alleviation of depression and anxiety, and help prevent relapse of depression and anxiety. Short term goal: Learn and implement personal skills for managing stress, solving daily problems, and resolving conflicts effectively.  Strategies: Use modeling and role-playing to help recognize anxious/depressive/negative thought patterns that create anxious/depressive/negative feelings and actions, interrupt them and replace with more positive reality-based thoughts that do not support depression  Diagnosis:   ICD-10-CM   1. Major depressive disorder, recurrent episode, mild ([place])  F33.0      Plan:  Patient today actively participating in session and showing motivation as she focused more on her depression and some anxiety especially relating to changes that are occurring with her husband of 11 years and in their marriage due to mostly to his decreased cognitive behavior at times and also some decreased hearing, although the hearing is better due to hearing aids.  Patient making progress and is to continue with her goal-directed behaviors in terms of managing her stress/depression/anxiety especially in daily life as changes occur, as noted above.  Continues  her use of better boundaries within the family and extended family.  Focusing more on positives versus negatives. Encouraged patient in her practice of more positive behaviors as discussed in session including: Interrupting anxious/depressive thoughts and challenging them to replace with more supportive/realistic thoughts, believe more in herself that she can make  significant positive changes and feel better about her future, interrupt anxious/depressive thoughts and challenge them to replace with more supportive/realistic thoughts, get outside daily especially with her dog which is very therapeutic for her, stay in contact with supportive people, remain in the present focusing on what she can control or change, stay involved in her church which is very nurturing and supportive of her, allow her faith to be a healing resource for her emotionally, saying no when she needs to say no, healthy boundaries with others, positive self talk, and realize the strengths she shows working with goal-directed behaviors to move in a direction that supports her improved emotional health and outlook.  Goal review and progress/challenges noted with patient.  Next appointment within 2 to 3 weeks.  This record has been created using [employer].  Chart creation errors have been sought, but may not always have been located and corrected.  Such creation errors do not reflect on the standard of medical care provided.   Shanon Ace, LCSW

## 2022-04-01 ENCOUNTER — Encounter (HOSPITAL_BASED_OUTPATIENT_CLINIC_OR_DEPARTMENT_OTHER): Payer: Medicare Other | Admitting: Physical Therapy

## 2022-04-02 ENCOUNTER — Encounter (HOSPITAL_BASED_OUTPATIENT_CLINIC_OR_DEPARTMENT_OTHER): Payer: Self-pay | Admitting: Physical Therapy

## 2022-04-03 DIAGNOSIS — K219 Gastro-esophageal reflux disease without esophagitis: Secondary | ICD-10-CM | POA: Diagnosis not present

## 2022-04-03 DIAGNOSIS — R0789 Other chest pain: Secondary | ICD-10-CM | POA: Diagnosis not present

## 2022-04-03 DIAGNOSIS — R058 Other specified cough: Secondary | ICD-10-CM | POA: Diagnosis not present

## 2022-04-03 DIAGNOSIS — Z96652 Presence of left artificial knee joint: Secondary | ICD-10-CM | POA: Diagnosis not present

## 2022-04-06 ENCOUNTER — Encounter (HOSPITAL_BASED_OUTPATIENT_CLINIC_OR_DEPARTMENT_OTHER): Payer: Self-pay | Admitting: Physical Therapy

## 2022-04-06 ENCOUNTER — Ambulatory Visit (HOSPITAL_BASED_OUTPATIENT_CLINIC_OR_DEPARTMENT_OTHER): Payer: Medicare Other | Attending: Orthopedic Surgery | Admitting: Physical Therapy

## 2022-04-06 DIAGNOSIS — E039 Hypothyroidism, unspecified: Secondary | ICD-10-CM | POA: Diagnosis not present

## 2022-04-06 DIAGNOSIS — M6281 Muscle weakness (generalized): Secondary | ICD-10-CM | POA: Diagnosis not present

## 2022-04-06 DIAGNOSIS — M25562 Pain in left knee: Secondary | ICD-10-CM | POA: Insufficient documentation

## 2022-04-06 DIAGNOSIS — M25662 Stiffness of left knee, not elsewhere classified: Secondary | ICD-10-CM | POA: Insufficient documentation

## 2022-04-06 DIAGNOSIS — M810 Age-related osteoporosis without current pathological fracture: Secondary | ICD-10-CM | POA: Diagnosis not present

## 2022-04-06 DIAGNOSIS — E785 Hyperlipidemia, unspecified: Secondary | ICD-10-CM | POA: Diagnosis not present
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
## 2022-04-09 ENCOUNTER — Ambulatory Visit (INDEPENDENT_AMBULATORY_CARE_PROVIDER_SITE_OTHER): Payer: Medicare Other | Admitting: Psychiatry

## 2022-04-09 ENCOUNTER — Encounter: Payer: Self-pay | Admitting: Psychiatry

## 2022-04-09 DIAGNOSIS — F330 Major depressive disorder, recurrent, mild: Secondary | ICD-10-CM | POA: Diagnosis not present

## 2022-04-09 MED ORDER — LAMOTRIGINE 25 MG PO TABS: 25.0000 mg | ORAL_TABLET | Freq: Every day | ORAL | 1 refills | Status: DC
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
## 2022-04-14 ENCOUNTER — Encounter: Payer: Self-pay | Admitting: Obstetrics and Gynecology

## 2022-04-14 ENCOUNTER — Ambulatory Visit (INDEPENDENT_AMBULATORY_CARE_PROVIDER_SITE_OTHER): Payer: Medicare Other | Admitting: Obstetrics and Gynecology

## 2022-04-14 ENCOUNTER — Ambulatory Visit (HOSPITAL_BASED_OUTPATIENT_CLINIC_OR_DEPARTMENT_OTHER): Payer: Medicare Other | Admitting: Physical Therapy

## 2022-04-14 VITALS — BP 118/76 | HR 84 | Ht 65.0 in | Wt 161.0 lb

## 2022-04-14 DIAGNOSIS — N766 Ulceration of vulva: Secondary | ICD-10-CM | POA: Diagnosis not present

## 2022-04-14 DIAGNOSIS — L720 Epidermal cyst: Secondary | ICD-10-CM | POA: Diagnosis not present

## 2022-04-14 DIAGNOSIS — M6281 Muscle weakness (generalized): Secondary | ICD-10-CM

## 2022-04-14 DIAGNOSIS — M25562 Pain in left knee: Secondary | ICD-10-CM

## 2022-04-14 DIAGNOSIS — M25662 Stiffness of left knee, not elsewhere classified: Secondary | ICD-10-CM
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
## 2022-04-14 NOTE — Patient Instructions (Signed)
Epidermoid Cyst  An epidermoid cyst, also known as epidermal cyst, is a sac made of skin tissue. The sac contains a substance called keratin. Keratin is a protein that is normally secreted through the hair follicles. When keratin becomes trapped in the top layer of skin (epidermis), it can form an epidermoid cyst. Epidermoid cysts can be found anywhere on your body. These cysts are usually harmless (benign), and they may not cause symptoms unless they become inflamed or infected. What are the causes? This condition may be caused by: A blocked hair follicle. A hair that curls and re-enters the skin instead of growing straight out of the skin (ingrown hair). A blocked pore. Irritated skin. An injury to the skin. Certain conditions that are passed along from parent to child (inherited). Human papillomavirus (HPV). This happens rarely when cysts occur on the bottom of the feet. Long-term (chronic) sun damage to the skin. What increases the risk? The following factors may make you more likely to develop an epidermoid cyst: Having acne. Being female. Having an injury to the skin. Being past puberty. Having certain rare genetic disorders. What are the signs or symptoms? The only symptom of this condition may be a small, painless lump underneath the skin. When an epidermal cyst ruptures, it may become inflamed. True infection in cysts is rare. Symptoms may include: Redness. Inflammation. Tenderness. Warmth. Keratin draining from the cyst. Keratin is grayish-white, bad-smelling substance. Pus draining from the cyst. How is this diagnosed? This condition is diagnosed with a physical exam. In some cases, you may have a sample of tissue (biopsy) taken from your cyst to be examined under a microscope or tested for bacteria. You may be referred to a health care provider who specializes in skin care (dermatologist). How is this treated? If a cyst becomes inflamed, treatment may include: Opening and  draining the cyst, done by a health care provider. After draining, minor surgery to remove the rest of the cyst may be done. Taking antibiotic medicine. Having injections of medicines (steroids) that help to reduce inflammation. Having surgery to remove the cyst. Surgery may be done if the cyst: Becomes large. Bothers you. Has a chance of turning into cancer. Do not try to open a cyst yourself. Follow these instructions at home: Medicines If you were prescribed an antibiotic medicine, take it it as told by your health care provider. Do not stop using the antibiotic even if you start to feel better. Take over-the-counter and prescription medicines only as told by your health care provider. General instructions Keep the area around your cyst clean and dry. Wear loose, dry clothing. Avoid touching your cyst. Check your cyst every day for signs of infection. Check for: Redness, swelling, or pain. Fluid or blood. Warmth. Pus or a bad smell. Keep all follow-up visits. This is important. How is this prevented? Wear clean, dry, clothing. Avoid wearing tight clothing. Keep your skin clean and dry. Take showers or baths every day. Contact a health care provider if: Your cyst develops symptoms of infection. Your condition is not improving or is getting worse. You develop a cyst that looks different from other cysts you have had. You have a fever. Get help right away if: Redness spreads from the cyst into the surrounding area. Summary An epidermoid cyst is a sac made of skin tissue. These cysts are usually harmless (benign), and they may not cause symptoms unless they become inflamed. If a cyst becomes inflamed, treatment may include surgery to open and drain the   cyst, or to remove it. Treatment may also include medicines by mouth or through an injection. Take over-the-counter and prescription medicines only as told by your health care provider. If you were prescribed an antibiotic medicine,  take it as told by your health care provider. Do not stop using the antibiotic even if you start to feel better. Contact a health care provider if your condition is not improving or is getting worse. Keep all follow-up visits as told by your health care provider. This is important. This information is not intended to replace advice given to you by your health care provider. Make sure you discuss any questions you have with your health care provider. Document Revised: 05/24/2019 Document Reviewed: 05/24/2019 Elsevier Patient Education  2023 Elsevier Inc.  

## 2022-04-15 ENCOUNTER — Encounter (HOSPITAL_BASED_OUTPATIENT_CLINIC_OR_DEPARTMENT_OTHER): Payer: Self-pay | Admitting: Physical Therapy

## 2022-04-15 ENCOUNTER — Other Ambulatory Visit: Payer: Self-pay

## 2022-04-15 DIAGNOSIS — F330 Major depressive disorder, recurrent, mild: Secondary | ICD-10-CM

## 2022-04-15 DIAGNOSIS — F5101 Primary insomnia: Secondary | ICD-10-CM

## 2022-04-15 DIAGNOSIS — F411 Generalized anxiety disorder: Secondary | ICD-10-CM

## 2022-04-15 MED ORDER — TRAZODONE HCL 50 MG PO TABS: 25.0000 mg | ORAL_TABLET | Freq: Every day | ORAL | 0 refills | Status: DC

## 2022-04-15 MED ORDER — LORAZEPAM 1 MG PO TABS: ORAL_TABLET | ORAL | 5 refills | Status: DC

## 2022-04-15 MED ORDER — DULOXETINE HCL 60 MG PO CPEP: 60.0000 mg | ORAL_CAPSULE | Freq: Every day | ORAL | 0 refills | Status: DC

## 2022-04-15 MED ORDER — BUSPIRONE HCL 10 MG PO TABS: 10.0000 mg | ORAL_TABLET | Freq: Two times a day (BID) | ORAL | 0 refills | Status: DC

## 2022-04-16 DIAGNOSIS — E663 Overweight: Secondary | ICD-10-CM | POA: Diagnosis not present

## 2022-04-16 DIAGNOSIS — G4733 Obstructive sleep apnea (adult) (pediatric): Secondary | ICD-10-CM | POA: Diagnosis not present

## 2022-04-16 DIAGNOSIS — R053 Chronic cough: Secondary | ICD-10-CM | POA: Diagnosis not present

## 2022-04-16 DIAGNOSIS — K219 Gastro-esophageal reflux disease without esophagitis: Secondary | ICD-10-CM | POA: Diagnosis not present

## 2022-04-17 DIAGNOSIS — M47816 Spondylosis without myelopathy or radiculopathy, lumbar region: Secondary | ICD-10-CM | POA: Diagnosis not present
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
## 2022-04-20 ENCOUNTER — Encounter (HOSPITAL_BASED_OUTPATIENT_CLINIC_OR_DEPARTMENT_OTHER): Payer: Self-pay | Admitting: Physical Therapy

## 2022-04-20 ENCOUNTER — Ambulatory Visit (HOSPITAL_BASED_OUTPATIENT_CLINIC_OR_DEPARTMENT_OTHER): Payer: Medicare Other | Admitting: Physical Therapy

## 2022-04-20 ENCOUNTER — Ambulatory Visit: Payer: Medicare Other | Admitting: Psychiatry

## 2022-04-20 DIAGNOSIS — M25662 Stiffness of left knee, not elsewhere classified: Secondary | ICD-10-CM | POA: Diagnosis not present

## 2022-04-20 DIAGNOSIS — M25562 Pain in left knee: Secondary | ICD-10-CM | POA: Diagnosis not present

## 2022-04-20 DIAGNOSIS — M6281 Muscle weakness (generalized): Secondary | ICD-10-CM | POA: Diagnosis not present

## 2022-04-21 ENCOUNTER — Ambulatory Visit (INDEPENDENT_AMBULATORY_CARE_PROVIDER_SITE_OTHER): Payer: Medicare Other | Admitting: Psychiatry

## 2022-04-21 DIAGNOSIS — G43019 Migraine without aura, intractable, without status migrainosus: Secondary | ICD-10-CM | POA: Diagnosis not present

## 2022-04-21 DIAGNOSIS — F411 Generalized anxiety disorder: Secondary | ICD-10-CM

## 2022-04-21 NOTE — Progress Notes (Signed)
[facility] Counselor/Therapist Progress Note  Patient ID: [name], MRN: [id number],    Date: 04/21/2022  Time Spent: 50 minutes   Treatment Type: Individual Therapy  Reported Symptoms:  anxiety, some depression, "I'm coming off Lamictal"  Mental Status Exam:  Appearance:   Casual and Neat     Behavior:  Appropriate, Sharing, and Motivated  Motor:  Normal  Speech/Language:   Clear and Coherent  Affect:  anxious  Mood:  anxious  Thought process:  goal directed  Thought content:    WNL  Sensory/Perceptual disturbances:    WNL  Orientation:  oriented to person, place, time/date, situation, day of week, month of year, year, and stated date of Feb. 20, 2024  Attention:  Good  Concentration:  Good  Memory:  Some short term memory concerns  Fund of knowledge:   Good  Insight:    Good  Judgment:   Good  Impulse Control:  Good   Risk Assessment: Danger to Self:  No Self-injurious Behavior: No Danger to Others: No Duty to Warn:no Physical Aggression / Violence:No  Access to Firearms a concern: No  Gang Involvement:No   Subjective:  Patient in today reporting anxiety, some depression. Is coming off her Lamictal gradually. Continued physical pain in her back an neck and has has prior interventions with both her neck and back. Is feeling some more optimistic.  Feeling more "up" today. Things going well at her church where they are very involved. To pick up new car soon and looking forward to that. Depression some better. Reporting husband having cognitive deficits and still able to be actively involved at their church for which patient is grateful. Stressed with some family situations. Concern about older friends and their health issues which she discussed today as they've been friends for a number of years.  And in some ways it seems to have intensified her concerns about her own husband's health issues.  Did well today and talking through multiple stressors and trying not to  get into "worrying mode "which works against her rather than for her improved mental health.  Continues to feel more positive about decreasing her Lamictal with the support of her medical provider as she feels it has helped her memory.  Continue to work also on communicating with husband due to his memory and hearing challenges and more recently has felt more encouraged.  Depression some less then previous session.  Ongoing challenge to remain in the present versus overly focusing on the past.  Interventions: Cognitive Behavioral Therapy and Ego-Supportive  Long term goal: Develop healthy cognitive patterns and beliefs about self and the world that lead to alleviation of depression and anxiety, and help prevent relapse of depression and anxiety. Short term goal: Learn and implement personal skills for managing stress, solving daily problems, and resolving conflicts effectively.  Strategies: Use modeling and role-playing to help recognize anxious/depressive/negative thought patterns that create anxious/depressive/negative feelings and actions, interrupt them and replace with more positive reality-based thoughts that do not support depression  Diagnosis:   ICD-10-CM   1. Generalized anxiety disorder  F41.1      Plan:  Patient today showing good motivation and participation in session as she focused on her anxiety and some depression which she reports has decreased some.  Is definitely making progress and needs to continue working with her goal-directed behaviors in order to keep moving forward in a positive direction.  Mood overall seems some better today despite her anxiety.  Did encourage  the use of healthy boundaries within family and extended family as she continues to work on seeing the positives more than the negatives. Encouraged patient in her practice of more self affirming and positive behaviors as noted in session including: Interrupting anxious/depressive thoughts and challenging them to  replace with more supportive/realistic thoughts, believe more in herself that she can make significant positive changes and feel better about her future, get outside daily especially with her dog which is very therapeutic for her, remain in contact with supportive people, stay in the present focusing what she can control or change, stay involved in her church which is very nurturing and supportive of she and her husband, allow her faith to be a healing resource for her emotionally, saying no when she needs to say no, healthy boundaries with others, use of positive self talk, and recognize the strengths she shows working with goal-directed behaviors to move in a direction that supports her improved emotional health and overall wellbeing.  Goal review and progress/challenges noted with patient.  Next appointment within 2 to 3 weeks.  This record has been created using [employer].  Chart creation errors have been sought, but may not always have been located and corrected.  Such creation errors do not reflect on the standard of medical care provided.   Shanon Ace, LCSW

## 2022-04-28 ENCOUNTER — Ambulatory Visit (HOSPITAL_BASED_OUTPATIENT_CLINIC_OR_DEPARTMENT_OTHER): Payer: Medicare Other | Admitting: Physical Therapy

## 2022-04-28 ENCOUNTER — Encounter (HOSPITAL_BASED_OUTPATIENT_CLINIC_OR_DEPARTMENT_OTHER): Payer: Self-pay | Admitting: Physical Therapy

## 2022-04-28 DIAGNOSIS — M25562 Pain in left knee: Secondary | ICD-10-CM

## 2022-04-28 DIAGNOSIS — M6281 Muscle weakness (generalized): Secondary | ICD-10-CM

## 2022-04-28 DIAGNOSIS — M25662 Stiffness of left knee, not elsewhere classified: Secondary | ICD-10-CM
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
## 2022-05-05 ENCOUNTER — Encounter (HOSPITAL_BASED_OUTPATIENT_CLINIC_OR_DEPARTMENT_OTHER): Payer: Medicare Other | Admitting: Physical Therapy

## 2022-05-12 ENCOUNTER — Ambulatory Visit: Payer: Medicare Other | Admitting: Psychiatry

## 2022-05-13 ENCOUNTER — Encounter (HOSPITAL_BASED_OUTPATIENT_CLINIC_OR_DEPARTMENT_OTHER): Payer: Medicare Other | Admitting: Physical Therapy
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
## 2022-05-15 DIAGNOSIS — M47816 Spondylosis without myelopathy or radiculopathy, lumbar region: Secondary | ICD-10-CM | POA: Diagnosis not present

## 2022-05-18 ENCOUNTER — Ambulatory Visit (INDEPENDENT_AMBULATORY_CARE_PROVIDER_SITE_OTHER): Payer: Medicare Other | Admitting: Psychiatry

## 2022-05-18 ENCOUNTER — Encounter: Payer: Self-pay | Admitting: Psychiatry

## 2022-05-18 DIAGNOSIS — F411 Generalized anxiety disorder: Secondary | ICD-10-CM

## 2022-05-18 DIAGNOSIS — F330 Major depressive disorder, recurrent, mild: Secondary | ICD-10-CM | POA: Diagnosis not present

## 2022-05-18 DIAGNOSIS — F5101 Primary insomnia: Secondary | ICD-10-CM | POA: Diagnosis not present

## 2022-05-18 MED ORDER — BUSPIRONE HCL 10 MG PO TABS: 10.0000 mg | ORAL_TABLET | Freq: Two times a day (BID) | ORAL | 1 refills | Status: DC

## 2022-05-18 MED ORDER — TRAZODONE HCL 50 MG PO TABS: 25.0000 mg | ORAL_TABLET | Freq: Every day | ORAL | 1 refills | Status: DC

## 2022-05-18 MED ORDER — DULOXETINE HCL 60 MG PO CPEP: 60.0000 mg | ORAL_CAPSULE | Freq: Every day | ORAL | 1 refills | Status: DC

## 2022-05-18 NOTE — Patient Instructions (Signed)
[facility] [place]. [place],  [place]  [id number] [phone]
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
## 2022-05-19 ENCOUNTER — Ambulatory Visit (HOSPITAL_BASED_OUTPATIENT_CLINIC_OR_DEPARTMENT_OTHER): Payer: Medicare Other | Attending: Orthopedic Surgery | Admitting: Physical Therapy

## 2022-05-19 DIAGNOSIS — M25562 Pain in left knee: Secondary | ICD-10-CM | POA: Diagnosis not present

## 2022-05-19 DIAGNOSIS — M6281 Muscle weakness (generalized): Secondary | ICD-10-CM | POA: Diagnosis not present

## 2022-05-19 DIAGNOSIS — M25662 Stiffness of left knee, not elsewhere classified: Secondary | ICD-10-CM | POA: Insufficient documentation
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
## 2022-05-20 ENCOUNTER — Encounter (HOSPITAL_BASED_OUTPATIENT_CLINIC_OR_DEPARTMENT_OTHER): Payer: Self-pay | Admitting: Physical Therapy

## 2022-05-22 ENCOUNTER — Ambulatory Visit: Payer: Medicare Other | Admitting: Obstetrics and Gynecology

## 2022-05-25 DIAGNOSIS — R058 Other specified cough: Secondary | ICD-10-CM | POA: Diagnosis not present

## 2022-05-25 DIAGNOSIS — K219 Gastro-esophageal reflux disease without esophagitis: Secondary | ICD-10-CM | POA: Diagnosis not present

## 2022-05-25 DIAGNOSIS — G4733 Obstructive sleep apnea (adult) (pediatric): Secondary | ICD-10-CM | POA: Diagnosis not present

## 2022-05-25 DIAGNOSIS — J0100 Acute maxillary sinusitis, unspecified: Secondary | ICD-10-CM | POA: Diagnosis not present

## 2022-05-26 ENCOUNTER — Encounter: Payer: Self-pay | Admitting: Obstetrics and Gynecology

## 2022-05-26 ENCOUNTER — Ambulatory Visit (INDEPENDENT_AMBULATORY_CARE_PROVIDER_SITE_OTHER): Payer: Medicare Other | Admitting: Obstetrics and Gynecology

## 2022-05-26 VITALS — BP 124/84 | HR 88 | Ht 65.0 in | Wt 159.0 lb

## 2022-05-26 DIAGNOSIS — F5231 Female orgasmic disorder: Secondary | ICD-10-CM

## 2022-05-26 DIAGNOSIS — N958 Other specified menopausal and perimenopausal disorders: Secondary | ICD-10-CM

## 2022-05-26 DIAGNOSIS — B009 Herpesviral infection, unspecified: Secondary | ICD-10-CM

## 2022-05-26 DIAGNOSIS — Z9189 Other specified personal risk factors, not elsewhere classified: Secondary | ICD-10-CM

## 2022-05-26 MED ORDER — ESTRADIOL 0.1 MG/GM VA CREA: 1.0000 g | TOPICAL_CREAM | VAGINAL | 2 refills | Status: DC

## 2022-05-26 MED ORDER — VALACYCLOVIR HCL 500 MG PO TABS: ORAL_TABLET | ORAL | 3 refills | Status: DC

## 2022-05-26 NOTE — Patient Instructions (Signed)

## 2022-05-27 ENCOUNTER — Ambulatory Visit (INDEPENDENT_AMBULATORY_CARE_PROVIDER_SITE_OTHER): Payer: Medicare Other | Admitting: Psychiatry

## 2022-05-27 DIAGNOSIS — F411 Generalized anxiety disorder: Secondary | ICD-10-CM | POA: Diagnosis not present

## 2022-05-27 NOTE — Progress Notes (Signed)
[facility] Counselor/Therapist Progress Note  Patient ID: DEL GIM, MRN: [id number],    Date: 05/27/2022  Time Spent: 50 minutes   Treatment Type: Individual Therapy  Reported Symptoms: anxiety, depression  Mental Status Exam:  Appearance:   Casual     Behavior:  Appropriate, Sharing, and motivation is challenging  Motor:  Normal  Speech/Language:   Clear and Coherent  Affect:  Depressed and anxious  Mood:  anxious and depressed  Thought process:  goal directed  Thought content:    WNL  Sensory/Perceptual disturbances:    WNL  Orientation:  oriented to person, place, time/date, situation, day of week, month of year, year, and stated date of May 27, 2022  Attention:  Fair  Concentration:  Good and Fair  Memory:  Is consulting with "geriatric dept at [facility] this Friday"  Fund of knowledge:   Good  Insight:    Good and Fair  Judgment:   Good  Impulse Control:  Good   Risk Assessment: Danger to Self:  No Self-injurious Behavior: No Danger to Others: No Duty to Warn:no Physical Aggression / Violence:No  Access to Firearms a concern: No  Gang Involvement:No   Subjective:  Patient in session today reporting anxiety and depression related to personal and family situations. Came in today upset and needing to talk through recent family interactions and communications. Worked in session today on relationship with oldest daughter, especially in setting appropriate limits and boundaries with her and indicating a need for there to be mutual respect and their relationship.  (Not all details included in this note due to patient privacy needs.)  Patient and daughter had communicated back-and-forth some but currently are not communicating.  Patient reports that she is going to think through what she wants to respond to daughter rather than having a knee-jerk reaction and responding too quickly.  Is very aware of the need for healthy boundaries and being able "to say no when she  needs to say no without feeling guilty".  Processed this in session and knows from past history, how important it will be for her to have healthier boundaries with daughter and son-in-law.  Discussed some examples of healthy boundaries, and also some ways that patient might be able to verbally interact with oldest daughter and it being interpreted in a more positive way.  Patient remains concerned about her husband and his health issues as well as her own.  Is trying not to "over worry" which she reports does not help her mental health. Denies any SI.   Interventions: Cognitive Behavioral Therapy and Solution-Oriented/Positive Psychology  Long term goal: Develop healthy cognitive patterns and beliefs about self and the world that lead to alleviation of depression and anxiety, and help prevent relapse of depression and anxiety. Short term goal: Learn and implement personal skills for managing stress, solving daily problems, and resolving conflicts effectively.  Strategies: Use modeling and role-playing to help recognize anxious/depressive/negative thought patterns that create anxious/depressive/negative feelings and actions, interrupt them and replace with more positive reality-based thoughts that do not support depression  Diagnosis:   ICD-10-CM   1. Generalized anxiety disorder  F41.1      Plan:  Patient in today actively participating in session reporting anxiety and depression related to family and personal situations.  She did well in addressing issues more directly although because they are some sensitive issues within her family, it was difficult for patient.  Seem to feel supported and encouraged by session and.  Also  seem to gain strength and talking through things and looking at some options towards the end of session.  Patient is making progress and needs to continue with goal-directed behaviors in order to keep moving in a forward direction.  Reviewed depression and anxiety coping skills  at end of session.  Encouraged healthy boundaries within the family and extended family. Encouraged patient and practicing more self affirming/positive behaviors as noted in session including: Believing more in herself that she can make significant positive changes and feel better about her future, interrupt anxious/depressive thoughts and challenge them to replace with more realistic thoughts, get outside daily with her dog which is very therapeutic for her, staying in contact with supportive people, remain in the present focusing on what she can control or change, stay connected with her church family which is very nurturing and supportive of she and husband, allow her faith to be a healing resource for her emotionally, saying no when she needs to say no, healthy boundaries with others, use of positive self talk, and realize the strength she shows working with goal-directed behaviors to move in a direction that supports her improved emotional health and outlook.  Self rating scales: 1-10 depression scale-6 1-10 anxiety scale-5  Goal review and progress/challenges noted with patient.  Next appointment within 2 weeks.  This record has been created using [employer].  Chart creation errors have been sought, but may not always have been located and corrected.  Such creation errors do not reflect on the standard of medical care provided.   Shanon Ace, LCSW

## 2022-05-29 DIAGNOSIS — F5101 Primary insomnia: Secondary | ICD-10-CM | POA: Diagnosis not present

## 2022-05-29 DIAGNOSIS — F419 Anxiety disorder, unspecified: Secondary | ICD-10-CM | POA: Diagnosis not present

## 2022-05-29 DIAGNOSIS — Z79899 Other long term (current) drug therapy: Secondary | ICD-10-CM | POA: Diagnosis not present

## 2022-05-29 DIAGNOSIS — F331 Major depressive disorder, recurrent, moderate: Secondary | ICD-10-CM | POA: Diagnosis not present

## 2022-05-29 DIAGNOSIS — G3184 Mild cognitive impairment, so stated: Secondary | ICD-10-CM | POA: Diagnosis not present

## 2022-05-29 LAB — TESTOS,TOTAL,FREE AND SHBG (FEMALE)
Free Testosterone: 0.3 pg/mL (ref 0.2–3.7)
Sex Hormone Binding: 44.3 nmol/L (ref 14–73)
Testosterone, Total, LC-MS-MS: 3 ng/dL (ref 2–45)

## 2022-06-01 ENCOUNTER — Encounter: Payer: Self-pay | Admitting: Neurology

## 2022-06-01 ENCOUNTER — Ambulatory Visit (HOSPITAL_BASED_OUTPATIENT_CLINIC_OR_DEPARTMENT_OTHER): Payer: Medicare Other | Admitting: Physical Therapy

## 2022-06-02 ENCOUNTER — Encounter: Payer: Self-pay | Admitting: Obstetrics and Gynecology

## 2022-06-02 ENCOUNTER — Ambulatory Visit: Payer: Medicare Other | Admitting: Psychiatry

## 2022-06-03 ENCOUNTER — Other Ambulatory Visit: Payer: Self-pay

## 2022-06-03 MED ORDER — NONFORMULARY OR COMPOUNDED ITEM: 0 refills | Status: DC

## 2022-06-09 ENCOUNTER — Encounter (HOSPITAL_BASED_OUTPATIENT_CLINIC_OR_DEPARTMENT_OTHER): Payer: Self-pay | Admitting: Physical Therapy

## 2022-06-09 ENCOUNTER — Ambulatory Visit (HOSPITAL_BASED_OUTPATIENT_CLINIC_OR_DEPARTMENT_OTHER): Payer: Medicare Other | Attending: Orthopedic Surgery | Admitting: Physical Therapy

## 2022-06-09 DIAGNOSIS — M6281 Muscle weakness (generalized): Secondary | ICD-10-CM | POA: Insufficient documentation

## 2022-06-09 DIAGNOSIS — M25562 Pain in left knee: Secondary | ICD-10-CM | POA: Diagnosis not present

## 2022-06-09 DIAGNOSIS — M25662 Stiffness of left knee, not elsewhere classified: Secondary | ICD-10-CM | POA: Diagnosis not present
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
## 2022-06-10 ENCOUNTER — Ambulatory Visit: Payer: Medicare Other | Admitting: Adult Health

## 2022-06-15 ENCOUNTER — Ambulatory Visit (INDEPENDENT_AMBULATORY_CARE_PROVIDER_SITE_OTHER): Payer: Medicare Other | Admitting: Psychiatry

## 2022-06-15 DIAGNOSIS — F411 Generalized anxiety disorder: Secondary | ICD-10-CM | POA: Diagnosis not present

## 2022-06-15 NOTE — Progress Notes (Signed)
[facility] Counselor/Therapist Progress Note  Patient ID: [name], MRN: [id number],    Date: 06/15/2022  Time Spent: 55 minutes   Treatment Type: Individual Therapy  Reported Symptoms: anxiety, depression, denies any SI  Mental Status Exam:  Appearance:   Neat     Behavior:  Appropriate, Sharing, and some decreased motivationb  Motor:  Normal  Speech/Language:   Clear and Coherent  Affect:  Depressed and anxious  Mood:  anxious and depressed  Thought process:  goal directed  Thought content:    WNL  Sensory/Perceptual disturbances:    WNL  Orientation:  oriented to person, place, time/date, situation, day of week, month of year, year, and stated date of June 15, 2022  Attention:  Good  Concentration:  Good  Memory:  Some short term memory concerns and Dr is aware  Fund of knowledge:   Good  Insight:    Good  Judgment:   Good  Impulse Control:  Good   Risk Assessment: Danger to Self:  No Self-injurious Behavior: No Danger to Others: No Duty to Warn:no Physical Aggression / Violence:No  Access to Firearms a concern: No  Gang Involvement:No   Subjective:  Patient in today reporting anxiety and depression related to personal, health, and family concerns. Relationship with oldest daughter has been challenging recently but has improved some. Processed some of the friction between her and one of adult daughters in recent weeks and still continuing. Trying to talk more with daughter and husband in order work things through and feel more mutual understanding however that has been difficult. Several issues coming up relating to the need of healthy boundaries in communicating with family and others. Processed her feelings of concern and how upsetting some of the recent communication has been re: possible eventual move and in others being concerned with their money management. To visit some area retirement homes soon. Still working on healthier boundaries and able to set  good boundaries without guilt; discussed good examples of healthy boundaries. Decreased over-worrying some but still working on this.   Interventions: Cognitive Behavioral Therapy and Ego-Supportive  Long term goal: Develop healthy cognitive patterns and beliefs about self and the world that lead to alleviation of depression and anxiety, and help prevent relapse of depression and anxiety. Short term goal: Learn and implement personal skills for managing stress, solving daily problems, and resolving conflicts effectively.  Strategies: Use modeling and role-playing to help recognize anxious/depressive/negative thought patterns that create anxious/depressive/negative feelings and actions, interrupt them and replace with more positive reality-based thoughts that do not support depression   Diagnosis:   ICD-10-CM   1. Generalized anxiety disorder  F41.1      Plan:  Patient today showing good motivation and participation in session as she focused more on her anxiety and depression mostly related to personal/health/and family concerns.  Seems to be showing a little more resilience today which was encouraging.  Has been a stressful period of time recently regarding some of their personal and family concerns and some difficult communication.  Is making progress and needs to continue working with goal-directed behaviors to keep moving in a forward direction. Encouraged patient in her practice of more positive/self affirming behaviors as noted in session including: Maintaining healthy boundaries within the family and her extended family, believing more in herself that she can make significant positive changes and feel better about her future, interrupt anxious/depressive thoughts and challenge them to replace with more realistic thoughts, get outside daily with her  dog which is very therapeutic for her, staying in contact with supportive people, remain in the present focusing on what she can control or change,  staying connected with her church family which is very supportive of she and husband, allow her faith to be a healing resource for her emotionally, saying no when she needs to say no, use of positive self talk, and recognize the strengths she shows working with goal-directed behaviors to move in a direction that supports her improved emotional health and overall wellbeing.  Self rating scales: 1-10 depression scale-5 (improved) 1-10 anxiety scale-4 (improved)  Goal review and progress/challenges noted with patient.  Next appointment within 3 weeks.  This record has been created using [employer].  Chart creation errors have been sought, but may not always have been located and corrected.  Such creation errors do not reflect on the standard of medical care provided.   Mathis Fare, LCSW

## 2022-06-16 ENCOUNTER — Encounter (HOSPITAL_BASED_OUTPATIENT_CLINIC_OR_DEPARTMENT_OTHER): Payer: Self-pay | Admitting: Physical Therapy

## 2022-06-16 ENCOUNTER — Ambulatory Visit (HOSPITAL_BASED_OUTPATIENT_CLINIC_OR_DEPARTMENT_OTHER): Payer: Medicare Other | Admitting: Physical Therapy

## 2022-06-16 DIAGNOSIS — M6281 Muscle weakness (generalized): Secondary | ICD-10-CM

## 2022-06-16 DIAGNOSIS — M25662 Stiffness of left knee, not elsewhere classified: Secondary | ICD-10-CM | POA: Diagnosis not present

## 2022-06-16 DIAGNOSIS — M25562 Pain in left knee: Secondary | ICD-10-CM
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
## 2022-06-22 DIAGNOSIS — M47816 Spondylosis without myelopathy or radiculopathy, lumbar region: Secondary | ICD-10-CM | POA: Diagnosis not present

## 2022-06-22 DIAGNOSIS — M7918 Myalgia, other site: Secondary | ICD-10-CM | POA: Diagnosis not present

## 2022-06-22 DIAGNOSIS — M47812 Spondylosis without myelopathy or radiculopathy, cervical region: Secondary | ICD-10-CM | POA: Diagnosis not present

## 2022-06-22 DIAGNOSIS — M7061 Trochanteric bursitis, right hip: Secondary | ICD-10-CM | POA: Diagnosis not present

## 2022-06-22 DIAGNOSIS — Z981 Arthrodesis status: Secondary | ICD-10-CM | POA: Diagnosis not present

## 2022-06-23 ENCOUNTER — Ambulatory Visit (HOSPITAL_BASED_OUTPATIENT_CLINIC_OR_DEPARTMENT_OTHER): Payer: Medicare Other | Admitting: Physical Therapy

## 2022-06-23 ENCOUNTER — Ambulatory Visit: Payer: Medicare Other | Admitting: Psychiatry

## 2022-06-23 DIAGNOSIS — M25562 Pain in left knee: Secondary | ICD-10-CM | POA: Diagnosis not present

## 2022-06-23 DIAGNOSIS — M25662 Stiffness of left knee, not elsewhere classified: Secondary | ICD-10-CM

## 2022-06-23 DIAGNOSIS — M6281 Muscle weakness (generalized): Secondary | ICD-10-CM | POA: Diagnosis not present
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
## 2022-06-24 ENCOUNTER — Encounter (HOSPITAL_BASED_OUTPATIENT_CLINIC_OR_DEPARTMENT_OTHER): Payer: Self-pay | Admitting: Physical Therapy

## 2022-06-29 ENCOUNTER — Ambulatory Visit (INDEPENDENT_AMBULATORY_CARE_PROVIDER_SITE_OTHER): Payer: Medicare Other | Admitting: Psychiatry

## 2022-06-29 DIAGNOSIS — F330 Major depressive disorder, recurrent, mild: Secondary | ICD-10-CM

## 2022-06-29 NOTE — Progress Notes (Signed)
[facility] Counselor/Therapist Progress Note  Patient ID: [name], MRN: [id number],    Date: 06/29/2022  Time Spent: 55 minutes   Treatment Type: Individual Therapy  Reported Symptoms: depression, anxiety  Mental Status Exam:  Appearance:   Casual     Behavior:  Appropriate, Sharing, and Motivated  Motor:  Normal  Speech/Language:   Clear and Coherent  Affect:  Depressed and anxious  Mood:  anxious and depressed  Thought process:  goal directed  Thought content:    WNL  Sensory/Perceptual disturbances:    WNL  Orientation:  oriented to person, place, time/date, situation, day of week, month of year, year, and stated date of June 29, 2022  Attention:  Good  Concentration:  Good  Memory:  Some short term memory issues  Fund of knowledge:   Good  Insight:    Good  Judgment:   Good  Impulse Control:  Good   Risk Assessment: Danger to Self:  No Self-injurious Behavior: No Danger to Others: No Duty to Warn:no Physical Aggression / Violence:No  Access to Firearms a concern: No  Gang Involvement:No   Subjective:  Patient in session today reporting depression and anxiety, which has improved some, and is related mostly to concerns for her husband's personal and health issues affecting their marriage, some interpersonal issues between them, and we spend a lot of time at home and had not been getting outside of the home as much. Talked about this at length today and she seemed to realize the impact less socialization has had on them. Discussed more activities available to them through friends, their church, and neighborhood, and agrees to talk further with husband and get out of the house at least once daily between now and her next appt. Concerns and work on relationship with oldest adult daughter is still a challenge and is continuing to work on it. "No further incidents with oldest daughter since last session." Concerned about other adult daughter's emotional issues "as  she has had a bad spell more recently", including some impulsive decision-making, but seems some better now.  Continues to consider information about retirement communities in the area for she and her husband eventually.  Depression some improved today.  Interventions: Cognitive Behavioral Therapy and Ego-Supportive  Long term goal: Develop healthy cognitive patterns and beliefs about self and the world that lead to alleviation of depression and anxiety, and help prevent relapse of depression and anxiety. Short term goal: Learn and implement personal skills for managing stress, solving daily problems, and resolving conflicts effectively.  Strategies: Use modeling and role-playing to help recognize anxious/depressive/negative thought patterns that create anxious/depressive/negative feelings and actions, interrupt them and replace with more positive reality-based thoughts that do not support depression  Diagnosis:   ICD-10-CM   1. Generalized anxiety disorder  F41.1      Plan:  Patient actively participating in session today working on her depression and anxiety especially regarding some personal and health issues affecting her marriage.  Admits that more recently they have not gotten outside of the home as much and feels that that has been a big factor in some of her mood issues.  Has plans to change and get outside more in the coming days especially as the weather improves.  Showing more resilience today.  Patient is making progress and needs to continue working with goal-directed behaviors to move in a forward direction. Encouraged patient and practicing more positive/self affirming behaviors as noted in session including: Believing more in  herself that she can make significant positive changes and feel better about her future, maintaining healthy boundaries within the family and her extended family, interrupt anxious/depressive thoughts and challenge them to replace with more realistic thoughts,  get outside daily with her dog which is very therapeutic for her, remain in contact with supportive people, stay in the present focusing on what she can control or change, stay connected with her church family which is very supportive of she and her husband, allow her faith to be a healing resource for her emotionally, saying no when she needs to say no, use of positive self talk, and realize the strength she shows working with goal-directed behaviors to move in a direction that supports her improved emotional health and outlook.  Goal review and progress/challenges noted with patient.  Next appointment within 3 weeks.   Mathis Fare, LCSW

## 2022-06-30 ENCOUNTER — Encounter (HOSPITAL_BASED_OUTPATIENT_CLINIC_OR_DEPARTMENT_OTHER): Payer: Self-pay | Admitting: Physical Therapy

## 2022-06-30 ENCOUNTER — Encounter (HOSPITAL_BASED_OUTPATIENT_CLINIC_OR_DEPARTMENT_OTHER): Payer: Medicare Other | Admitting: Physical Therapy

## 2022-06-30 ENCOUNTER — Ambulatory Visit (HOSPITAL_BASED_OUTPATIENT_CLINIC_OR_DEPARTMENT_OTHER): Payer: Medicare Other | Admitting: Physical Therapy

## 2022-06-30 DIAGNOSIS — M25662 Stiffness of left knee, not elsewhere classified: Secondary | ICD-10-CM | POA: Diagnosis not present

## 2022-06-30 DIAGNOSIS — M25562 Pain in left knee: Secondary | ICD-10-CM

## 2022-06-30 DIAGNOSIS — M6281 Muscle weakness (generalized): Secondary | ICD-10-CM
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
## 2022-07-01 ENCOUNTER — Encounter: Payer: Self-pay | Admitting: Neurology

## 2022-07-01 ENCOUNTER — Ambulatory Visit (INDEPENDENT_AMBULATORY_CARE_PROVIDER_SITE_OTHER): Payer: Medicare Other | Admitting: Neurology

## 2022-07-01 VITALS — BP 118/70 | HR 70 | Ht 65.0 in | Wt 159.0 lb

## 2022-07-01 DIAGNOSIS — R0683 Snoring: Secondary | ICD-10-CM

## 2022-07-01 DIAGNOSIS — G4701 Insomnia due to medical condition: Secondary | ICD-10-CM | POA: Diagnosis not present

## 2022-07-01 DIAGNOSIS — G8929 Other chronic pain: Secondary | ICD-10-CM
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
## 2022-07-06 ENCOUNTER — Ambulatory Visit (HOSPITAL_BASED_OUTPATIENT_CLINIC_OR_DEPARTMENT_OTHER): Payer: Medicare Other | Attending: Orthopedic Surgery | Admitting: Physical Therapy

## 2022-07-06 DIAGNOSIS — M6281 Muscle weakness (generalized): Secondary | ICD-10-CM

## 2022-07-06 DIAGNOSIS — M25662 Stiffness of left knee, not elsewhere classified: Secondary | ICD-10-CM

## 2022-07-06 DIAGNOSIS — M25562 Pain in left knee: Secondary | ICD-10-CM

## 2022-07-07 ENCOUNTER — Encounter (HOSPITAL_BASED_OUTPATIENT_CLINIC_OR_DEPARTMENT_OTHER): Payer: Self-pay | Admitting: Physical Therapy

## 2022-07-07 DIAGNOSIS — F0390 Unspecified dementia without behavioral disturbance: Secondary | ICD-10-CM | POA: Diagnosis not present

## 2022-07-07 DIAGNOSIS — M5380 Other specified dorsopathies, site unspecified: Secondary | ICD-10-CM | POA: Diagnosis not present

## 2022-07-07 DIAGNOSIS — E663 Overweight: Secondary | ICD-10-CM | POA: Diagnosis not present

## 2022-07-07 DIAGNOSIS — K219 Gastro-esophageal reflux disease without esophagitis: Secondary | ICD-10-CM | POA: Diagnosis not present

## 2022-07-07 DIAGNOSIS — E039 Hypothyroidism, unspecified: Secondary | ICD-10-CM | POA: Diagnosis not present

## 2022-07-07 DIAGNOSIS — F329 Major depressive disorder, single episode, unspecified: Secondary | ICD-10-CM | POA: Diagnosis not present

## 2022-07-07 DIAGNOSIS — G43009 Migraine without aura, not intractable, without status migrainosus: Secondary | ICD-10-CM | POA: Diagnosis not present

## 2022-07-07 DIAGNOSIS — M1990 Unspecified osteoarthritis, unspecified site: Secondary | ICD-10-CM | POA: Diagnosis not present

## 2022-07-07 DIAGNOSIS — E785 Hyperlipidemia, unspecified: Secondary | ICD-10-CM | POA: Diagnosis not present

## 2022-07-07 DIAGNOSIS — N3281 Overactive bladder: Secondary | ICD-10-CM | POA: Diagnosis not present

## 2022-07-07 DIAGNOSIS — M810 Age-related osteoporosis without current pathological fracture: Secondary | ICD-10-CM | POA: Diagnosis not present

## 2022-07-07 DIAGNOSIS — G4733 Obstructive sleep apnea (adult) (pediatric): Secondary | ICD-10-CM | POA: Diagnosis not present

## 2022-07-09 ENCOUNTER — Ambulatory Visit (INDEPENDENT_AMBULATORY_CARE_PROVIDER_SITE_OTHER): Payer: Medicare Other | Admitting: Podiatry

## 2022-07-09 DIAGNOSIS — M2041 Other hammer toe(s) (acquired), right foot: Secondary | ICD-10-CM

## 2022-07-09 DIAGNOSIS — L84 Corns and callosities: Secondary | ICD-10-CM

## 2022-07-09 DIAGNOSIS — M25572 Pain in left ankle and joints of left foot: Secondary | ICD-10-CM | POA: Diagnosis not present

## 2022-07-09 DIAGNOSIS — M25571 Pain in right ankle and joints of right foot: Secondary | ICD-10-CM | POA: Diagnosis not present

## 2022-07-09 DIAGNOSIS — M2042 Other hammer toe(s) (acquired), left foot: Secondary | ICD-10-CM
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
## 2022-07-10 ENCOUNTER — Ambulatory Visit (INDEPENDENT_AMBULATORY_CARE_PROVIDER_SITE_OTHER): Payer: Medicare Other | Admitting: Obstetrics and Gynecology

## 2022-07-10 ENCOUNTER — Encounter: Payer: Self-pay | Admitting: Obstetrics and Gynecology

## 2022-07-10 VITALS — BP 110/76 | HR 73 | Wt 160.0 lb

## 2022-07-10 DIAGNOSIS — H26491 Other secondary cataract, right eye: Secondary | ICD-10-CM | POA: Diagnosis not present

## 2022-07-10 DIAGNOSIS — H5203 Hypermetropia, bilateral: Secondary | ICD-10-CM | POA: Diagnosis not present

## 2022-07-10 DIAGNOSIS — R6882 Decreased libido: Secondary | ICD-10-CM | POA: Diagnosis not present

## 2022-07-10 DIAGNOSIS — H52223 Regular astigmatism, bilateral: Secondary | ICD-10-CM | POA: Diagnosis not present

## 2022-07-10 DIAGNOSIS — H26492 Other secondary cataract, left eye: Secondary | ICD-10-CM | POA: Diagnosis not present

## 2022-07-10 DIAGNOSIS — Z5181 Encounter for therapeutic drug level monitoring: Secondary | ICD-10-CM

## 2022-07-10 DIAGNOSIS — D1723 Benign lipomatous neoplasm of skin and subcutaneous tissue of right leg: Secondary | ICD-10-CM

## 2022-07-10 DIAGNOSIS — H53143 Visual discomfort, bilateral: Secondary | ICD-10-CM | POA: Diagnosis not present

## 2022-07-10 DIAGNOSIS — H524 Presbyopia: Secondary | ICD-10-CM | POA: Diagnosis not present

## 2022-07-10 DIAGNOSIS — H04123 Dry eye syndrome of bilateral lacrimal glands: Secondary | ICD-10-CM | POA: Diagnosis not present
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
## 2022-07-10 NOTE — Patient Instructions (Signed)
Lipoma  A lipoma is a noncancerous (benign) tumor that is made up of fat cells. This is a very common type of soft-tissue growth. Lipomas are usually found under the skin (subcutaneous). They may occur in any tissue of the body that contains fat. Common areas for lipomas to appear include the back, arms, shoulders, buttocks, and thighs. Lipomas grow slowly, and they are usually painless. Most lipomas do not cause problems and do not require treatment. What are the causes? The cause of this condition is not known. What increases the risk? You are more likely to develop this condition if: You are 40-60 years old. You have a family history of lipomas. What are the signs or symptoms? A lipoma usually appears as a small, round bump under the skin. In most cases, the lump will: Feel soft or rubbery. Not cause pain or other symptoms. However, if a lipoma is located in an area where it pushes on nerves, it can become painful or cause other symptoms. How is this diagnosed? A lipoma can usually be diagnosed with a physical exam. You may also have tests to confirm the diagnosis and to rule out other conditions. Tests may include: Imaging tests, such as a CT scan or an MRI. Removal of a tissue sample to be looked at under a microscope (biopsy). How is this treated? Treatment for this condition depends on the size of the lipoma and whether it is causing any symptoms. For small lipomas that are not causing problems, no treatment is needed. If a lipoma is bigger or it causes problems, surgery may be done to remove the lipoma. Lipomas can also be removed to improve appearance. Most often, the procedure is done after applying a medicine that numbs the area (local anesthetic). Liposuction may be done to reduce the size of the lipoma before it is removed through surgery, or it may be done to remove the lipoma. Lipomas are removed with this method to limit incision size and scarring. A liposuction tube is  inserted through a small incision into the lipoma, and the contents of the lipoma are removed through the tube with suction. Follow these instructions at home: Watch your lipoma for any changes. Keep all follow-up visits. This is important. Where to find more information OrthoInfo: orthoinfo.aaos.org Contact a health care provider if: Your lipoma becomes larger or hard. Your lipoma becomes painful, red, or increasingly swollen. These could be signs of infection or a more serious condition. Get help right away if: You develop tingling or numbness in an area near the lipoma. This could indicate that the lipoma is causing nerve damage. Summary A lipoma is a noncancerous tumor that is made up of fat cells. Most lipomas do not cause problems and do not require treatment. If a lipoma is bigger or it causes problems, surgery may be done to remove the lipoma. Contact a health care provider if your lipoma becomes larger or hard, or if it becomes painful, red, or increasingly swollen. These could be signs of infection or a more serious condition. This information is not intended to replace advice given to you by your health care provider. Make sure you discuss any questions you have with your health care provider. Document Revised: 03/07/2021 Document Reviewed: 03/07/2021 Elsevier Patient Education  2023 [employer]  

## 2022-07-13 ENCOUNTER — Ambulatory Visit (HOSPITAL_BASED_OUTPATIENT_CLINIC_OR_DEPARTMENT_OTHER): Payer: Medicare Other | Admitting: Physical Therapy

## 2022-07-13 DIAGNOSIS — M25562 Pain in left knee: Secondary | ICD-10-CM

## 2022-07-13 DIAGNOSIS — M25662 Stiffness of left knee, not elsewhere classified: Secondary | ICD-10-CM

## 2022-07-13 DIAGNOSIS — M6281 Muscle weakness (generalized): Secondary | ICD-10-CM
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
## 2022-07-14 ENCOUNTER — Ambulatory Visit (INDEPENDENT_AMBULATORY_CARE_PROVIDER_SITE_OTHER): Payer: Medicare Other | Admitting: Psychiatry

## 2022-07-14 ENCOUNTER — Encounter (HOSPITAL_BASED_OUTPATIENT_CLINIC_OR_DEPARTMENT_OTHER): Payer: Self-pay | Admitting: Physical Therapy

## 2022-07-14 DIAGNOSIS — F411 Generalized anxiety disorder: Secondary | ICD-10-CM | POA: Diagnosis not present
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
## 2022-07-15 LAB — TESTOS,TOTAL,FREE AND SHBG (FEMALE)
Free Testosterone: 0.4 pg/mL (ref 0.2–3.7)
Sex Hormone Binding: 35.3 nmol/L (ref 14–73)
Testosterone, Total, LC-MS-MS: 4 ng/dL (ref 2–45)

## 2022-07-16 ENCOUNTER — Other Ambulatory Visit: Payer: Self-pay | Admitting: Obstetrics and Gynecology

## 2022-07-16 DIAGNOSIS — R6882 Decreased libido: Secondary | ICD-10-CM

## 2022-07-17 DIAGNOSIS — M533 Sacrococcygeal disorders, not elsewhere classified: Secondary | ICD-10-CM | POA: Diagnosis not present

## 2022-07-20 ENCOUNTER — Ambulatory Visit (HOSPITAL_BASED_OUTPATIENT_CLINIC_OR_DEPARTMENT_OTHER): Payer: Medicare Other | Admitting: Physical Therapy

## 2022-07-21 DIAGNOSIS — H26492 Other secondary cataract, left eye: Secondary | ICD-10-CM | POA: Diagnosis not present

## 2022-07-21 DIAGNOSIS — Z96652 Presence of left artificial knee joint: Secondary | ICD-10-CM | POA: Diagnosis not present

## 2022-07-24 ENCOUNTER — Encounter (HOSPITAL_BASED_OUTPATIENT_CLINIC_OR_DEPARTMENT_OTHER): Payer: Self-pay | Admitting: Physical Therapy

## 2022-07-28 ENCOUNTER — Ambulatory Visit (HOSPITAL_BASED_OUTPATIENT_CLINIC_OR_DEPARTMENT_OTHER): Payer: Medicare Other | Admitting: Physical Therapy

## 2022-07-28 DIAGNOSIS — M25571 Pain in right ankle and joints of right foot: Secondary | ICD-10-CM | POA: Diagnosis not present

## 2022-07-29 ENCOUNTER — Ambulatory Visit (INDEPENDENT_AMBULATORY_CARE_PROVIDER_SITE_OTHER): Payer: Medicare Other | Admitting: Psychiatry

## 2022-07-29 DIAGNOSIS — F411 Generalized anxiety disorder: Secondary | ICD-10-CM | POA: Diagnosis not present

## 2022-07-29 NOTE — Progress Notes (Signed)
[facility] Counselor/Therapist Progress Note  Patient ID: [name], MRN: [id number],    Date: 07/29/2022  Time Spent: 55 minutes   Treatment Type: Individual Therapy  Reported Symptoms: anxiety, depression (decreased)  Mental Status Exam:  Appearance:   Casual and Neat     Behavior:  Appropriate, Sharing, and Motivated  Motor:  Foot injury  Speech/Language:   Clear and Coherent  Affect:  anxious  Mood:  anxious  Thought process:  goal directed  Thought content:    WNL  Sensory/Perceptual disturbances:    WNL  Orientation:  oriented to person, place, time/date, situation, day of week, month of year, year, and stated date of Jul 29, 2022  Attention:  Good  Concentration:  Good  Memory:  Some short term memory issues  Fund of knowledge:   Good  Insight:    Good  Judgment:   Good  Impulse Control:  Good   Risk Assessment: Danger to Self:  No Self-injurious Behavior: No Danger to Others: No Duty to Warn:no Physical Aggression / Violence:No  Access to Firearms a concern: No  Gang Involvement:No   Subjective:   Patient in session today reporting anxiety and some depression (but improving). Worked today on her anxiety about family, personal, and church-related issues with which patient is experiencing. Clearer boundaries needed with family and in some situations at her church. Venting some current hurts within the family trying to get clarity, support, and come to some conclusions for herself with personal decisions in moving forward.  Specifically worked more on communication, setting of healthier boundaries, setting clear goals going forward, and working to let go of guilt in certain areas of family life.  Interventions: Cognitive Behavioral Therapy and Ego-Supportive  Long term goal: Develop healthy cognitive patterns and beliefs about self and the world that lead to alleviation of depression and anxiety, and help prevent relapse of depression and anxiety. Short  term goal: Learn and implement personal skills for managing stress, solving daily problems, and resolving conflicts effectively.  Strategies: Use modeling and role-playing to help recognize anxious/depressive/negative thought patterns that create anxious/depressive/negative feelings and actions, interrupt them and replace with more positive reality-based thoughts that do not support depression  Diagnosis:   ICD-10-CM   1. Generalized anxiety disorder  F41.1      Plan:   Patient in today focusing more on her depression and anxiety, along with gaining more skills in the area of communication, boundary setting, goals, and letting go of guilt.  Patient is showing progress and needs to continue her work with goal-directed behaviors as she keeps moving in a forward and healthier direction. Encouraged patient in practicing more positive/self affirming behaviors as noted in session including: Having more belief in herself that she can make significant positive changes and feel better about her future, maintain healthy boundaries with family and her extended family, interrupt anxious/depressive thoughts and challenge them to replace with more realistic thoughts, get outside daily with her dog which is very therapeutic for her, remain in contact with supportive people, stay in the present focusing on what she can change or control, remain connected with her church family which is very supportive of her, allow her faith to be a healing resource for her emotionally as well as spiritually, saying no when she needs to say no, positive self talk, and realize the strength she shows working with goal-directed behaviors to move in a direction that supports her improved emotional health and outlook.  Goal review and  progress/challenges noted with patient.  Next appointment within 2 to 3 weeks.   Mathis Fare, LCSW

## 2022-08-03 ENCOUNTER — Ambulatory Visit: Payer: Medicare Other | Admitting: Podiatry

## 2022-08-11 ENCOUNTER — Ambulatory Visit (INDEPENDENT_AMBULATORY_CARE_PROVIDER_SITE_OTHER): Payer: Medicare Other | Admitting: Psychiatry

## 2022-08-11 DIAGNOSIS — F330 Major depressive disorder, recurrent, mild: Secondary | ICD-10-CM

## 2022-08-11 NOTE — Progress Notes (Signed)
[facility] Counselor/Therapist Progress Note  Patient ID: [name], MRN: [id number],    Date: 08/11/2022  Time Spent: 55 minutes   Treatment Type: Individual Therapy  Reported Symptoms: depressed, discouraged, some anxiety "about things especially within church and family  Mental Status Exam:  Appearance:   Casual     Behavior:  Appropriate, Sharing, and Motivated  Motor:  Normal  Speech/Language:   Clear and Coherent  Affect:  Depressed  Mood:  depressed  Thought process:  goal directed  Thought content:    WNL  Sensory/Perceptual disturbances:    WNL  Orientation:  oriented to person, place, time/date, situation, day of week, month of year, year, and stated date of August 11, 2022  Attention:  Good  Concentration:  Good  Memory:  Some short term memory issues and Dr is aware  Fund of knowledge:   Good  Insight:    Good  Judgment:   Good  Impulse Control:  Good   Risk Assessment: Danger to Self:  No Self-injurious Behavior: No Danger to Others: No Duty to Warn:no Physical Aggression / Violence:No  Access to Firearms a concern: No  Gang Involvement:No   Subjective:   Patient in today reporting depression being stronger than her anxiety currently, and related to "personal feelings of inadequacy as mother, elder, wife roles", and patient needed session today to process these thoughts/feelings, trying to better understand them and be able to re-frame some of her thoughts in ways that she could process them and recognize some distortions which was helpful to patient.. Also processed some family situations and relationships that have been avoided "because of not knowing how to best approach the situations".  Venting more freely some recent hurts within the family and needed part of session to look at better ways she can state needs, set boundaries, and be able to say no at times without feeling badly.  This was helpful to patient in terms of having more clarity, and  arriving at some conclusions that feel helpful and fair to all involved within the family.  Interventions: Cognitive Behavioral Therapy and Ego-Supportive  Long term goal: Develop healthy cognitive patterns and beliefs about self and the world that lead to alleviation of depression and anxiety, and help prevent relapse of depression and anxiety. Short term goal: Learn and implement personal skills for managing stress, solving daily problems, and resolving conflicts effectively.  Strategies: Use modeling and role-playing to help recognize anxious/depressive/negative thought patterns that create anxious/depressive/negative feelings and actions, interrupt them and replace with more positive reality-based thoughts that do not support depression  Diagnosis:   ICD-10-CM   1. 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  Patient realizing more how the expectations of herself were too rigid and did not allow for much flexibility, closely tied to the communication issues we discussed last session.  Patient is progressing and needs to continue her work with goal-directed behaviors to keep moving and a clear and positive direction. Encouraged patient in her practice of more positive/self affirming behaviors as noted in session including: Believing more in herself that she can make significant positive changes and feel better about her future, maintain healthy boundaries with family and her extended family, interrupt anxious/depressive thoughts and challenge them to replace with more realistic thoughts, get outside daily with her  dog which is very therapeutic for her, staying in contact with supportive people, remain in the present focusing on what she can change or control, stay connected with her church family which is very  supportive of her and husband, allow her faith to be a healing resource for her emotionally as well as spiritually, saying no when she needs to say no, positive self talk, and recognize the strengths she shows working with goal-directed behaviors to move in a direction that supports her improved emotional health and overall wellbeing.  Goal review and progress/challenges noted with patient.  Next appointment within 2 to 3 weeks.   Mathis Fare, LCSW

## 2022-08-12 DIAGNOSIS — M71551 Other bursitis, not elsewhere classified, right hip: Secondary | ICD-10-CM | POA: Diagnosis not present

## 2022-08-12 DIAGNOSIS — M71552 Other bursitis, not elsewhere classified, left hip: Secondary | ICD-10-CM | POA: Diagnosis not present

## 2022-08-13 DIAGNOSIS — K219 Gastro-esophageal reflux disease without esophagitis: Secondary | ICD-10-CM | POA: Diagnosis not present

## 2022-08-13 DIAGNOSIS — Z800 Family history of malignant neoplasm of digestive organs: Secondary | ICD-10-CM | POA: Diagnosis not present

## 2022-08-13 DIAGNOSIS — K5904 Chronic idiopathic constipation: Secondary | ICD-10-CM | POA: Diagnosis not present

## 2022-08-13 DIAGNOSIS — R053 Chronic cough: Secondary | ICD-10-CM | POA: Diagnosis not present

## 2022-08-13 DIAGNOSIS — R0789 Other chest pain: Secondary | ICD-10-CM | POA: Diagnosis not present

## 2022-08-13 DIAGNOSIS — Z86010 Personal history of colonic polyps: Secondary | ICD-10-CM | POA: Diagnosis not present

## 2022-08-17 DIAGNOSIS — S92334D Nondisplaced fracture of third metatarsal bone, right foot, subsequent encounter for fracture with routine healing: Secondary | ICD-10-CM | POA: Diagnosis not present

## 2022-08-17 DIAGNOSIS — M79671 Pain in right foot: Secondary | ICD-10-CM | POA: Diagnosis not present

## 2022-08-18 ENCOUNTER — Other Ambulatory Visit: Payer: Self-pay

## 2022-08-18 DIAGNOSIS — R6882 Decreased libido: Secondary | ICD-10-CM

## 2022-08-18 MED ORDER — NONFORMULARY OR COMPOUNDED ITEM
0 refills | Status: DC
Start: 2022-08-18 — End: 2022-10-06

## 2022-08-18 NOTE — Telephone Encounter (Signed)
Called rx in. Lab appt scheduled for 09/29/22. Will close.

## 2022-08-18 NOTE — Telephone Encounter (Signed)
BS pt calling to report recent sig change on 0.1% testosterone cream rx from q other day to every day per BS per result note from 07/10/2022 and for her to return for repeat lab visit in 6 weeks.   Pt reports pharmacy was not notified of change and she needs new rx called into [facility].   Pt not yet scheduled for 6 week lab appt. Ok to call rx in and will make sure pt gets scheduled also? Please advise.

## 2022-08-18 NOTE — Telephone Encounter (Signed)
I sent in the script with the daily dosing (per Dr Edward Jolly) and schedule her for the f/u lab visit.

## 2022-08-21 ENCOUNTER — Ambulatory Visit: Payer: Medicare Other | Admitting: Podiatry

## 2022-08-25 ENCOUNTER — Ambulatory Visit: Payer: Medicare Other | Admitting: Psychiatry

## 2022-09-02 ENCOUNTER — Other Ambulatory Visit (HOSPITAL_COMMUNITY): Payer: Self-pay | Admitting: *Deleted

## 2022-09-02 DIAGNOSIS — K219 Gastro-esophageal reflux disease without esophagitis: Secondary | ICD-10-CM | POA: Diagnosis not present

## 2022-09-02 DIAGNOSIS — R112 Nausea with vomiting, unspecified: Secondary | ICD-10-CM | POA: Diagnosis not present

## 2022-09-07 ENCOUNTER — Ambulatory Visit (HOSPITAL_COMMUNITY)
Admission: RE | Admit: 2022-09-07 | Discharge: 2022-09-07 | Disposition: A | Discharge status: Home / Self Care | Payer: Medicare Other | Source: Ambulatory Visit | Attending: Internal Medicine | Admitting: Internal Medicine

## 2022-09-07 DIAGNOSIS — M810 Age-related osteoporosis without current pathological fracture: Secondary | ICD-10-CM | POA: Diagnosis not present

## 2022-09-07 DIAGNOSIS — S92331D Displaced fracture of third metatarsal bone, right foot, subsequent encounter for fracture with routine healing: Secondary | ICD-10-CM | POA: Diagnosis not present

## 2022-09-07 MED ORDER — DENOSUMAB 60 MG/ML ~~LOC~~ SOSY
60.0000 mg | PREFILLED_SYRINGE | Freq: Once | SUBCUTANEOUS | Status: AC
  Administered 2022-09-07: 60 mg via SUBCUTANEOUS

## 2022-09-07 MED ORDER — DENOSUMAB 60 MG/ML ~~LOC~~ SOSY
PREFILLED_SYRINGE | SUBCUTANEOUS | Status: AC
  Filled 2022-09-07: qty 1

## 2022-09-14 ENCOUNTER — Ambulatory Visit (INDEPENDENT_AMBULATORY_CARE_PROVIDER_SITE_OTHER): Payer: Medicare Other

## 2022-09-14 ENCOUNTER — Ambulatory Visit (INDEPENDENT_AMBULATORY_CARE_PROVIDER_SITE_OTHER): Payer: Medicare Other | Admitting: Podiatry

## 2022-09-14 DIAGNOSIS — M79671 Pain in right foot: Secondary | ICD-10-CM | POA: Diagnosis not present

## 2022-09-14 DIAGNOSIS — M21619 Bunion of unspecified foot: Secondary | ICD-10-CM | POA: Diagnosis not present

## 2022-09-14 DIAGNOSIS — M2041 Other hammer toe(s) (acquired), right foot: Secondary | ICD-10-CM | POA: Diagnosis not present

## 2022-09-14 DIAGNOSIS — M2042 Other hammer toe(s) (acquired), left foot: Secondary | ICD-10-CM

## 2022-09-14 DIAGNOSIS — M216X2 Other acquired deformities of left foot: Secondary | ICD-10-CM | POA: Diagnosis not present

## 2022-09-14 NOTE — Patient Instructions (Addendum)
For inserts I like POWERSTEPS, SUPERFEET, AETREX.  I like Brooks, New Balance and [employer] for shoes. I would recommend going to FLEET [employer]ET for shoes  --  The cream we talked about is called UREA CREAM- look for about 40% urea  --  Hammer Toe Hammer toe is a change in the shape, or a deformity, of the toe. The deformity causes the middle joint of the toe to stay bent. Hammer toe starts gradually. At first, the toe can be straightened. Then over time, the toe deformity becomes stiff, inflexible, and permanently bent. Hammer toe usually affects the second, third, or fourth toe. A hammer toe causes pain, especially when wearing shoes. Corns and calluses can result from the toe rubbing against the inside of the shoe. Early treatments to keep the toe straight may relieve pain. As the deformity of the toe becomes stiff and permanent, surgery may be needed to straighten the toe. What are the causes? This condition is caused by abnormal bending of the toe joint that is closest to your foot. Over time, the toe bending downward pulls on the muscles and connections (tendons) of the toe joint, making them weak and stiff. Wearing shoes that are too narrow in the toe box and do not allow toes to fully straighten can cause this condition. What increases the risk? You are more likely to develop this condition if you: Are an older female. Wear shoes that are too small, or wear high-heeled shoes that pinch your toes. Have a second toe that is longer than your big toe (first toe). Injure your foot or toe. Have arthritis, or have a nerve or muscle disorder. Have diabetes or a condition known as Charcot joint, which may cause you to walk abnormally. Have a family history of hammer toe. Are a Advertising account planner. What are the signs or symptoms? Pain and deformity of the toe are the main symptoms of this condition. The pain is worse when wearing shoes, walking, or running. Other symptoms may include: A thickened patch of  skin, called a corn or callus, that forms over the top of the bent part of the toe or between the toes. Redness and a burning feeling on the bent toe. An open sore that forms on the top of the bent toe. Not being able to straighten the affected toe. How is this diagnosed? This condition is diagnosed based on your symptoms and a physical exam. During the exam, your health care provider will try to straighten your toe to see how stiff the deformity is. You may also have tests, such as: A blood test to check for rheumatoid arthritis or diabetes. An X-ray to show how severe the toe deformity is. How is this treated? Treatment for this condition depends on whether the toe is flexible or deformed and no longer moveable. In less severe cases, a hammer toe can be straightened without surgery. These treatments include: Taping the toe into a straightened position. Using pads and cushions to protect the bent toe. Wearing shoes that provide enough room for the toes. Doing toe-stretching exercises at home. Taking an NSAID, such as ibuprofen, to reduce pain and swelling. Using special orthotics or insoles for pain relief and to improve walking. If these treatments do not help or the toe has a severe deformity and cannot be straightened, surgery is the next option. The most common surgeries used to straighten a hammer toe include: Arthroplasty or osteotomy. Part of the toe joint is reconstructed or removed, which allows the toe  to straighten. Fusion. Cartilage between the two bones of the joint is taken out, and the bones are fused together into one longer bone. Implantation. Part of the bone is removed and replaced with an implant to allow the toe to move again. Flexor tendon transfer. The tendons that curl the toes down (flexor tendons) are repositioned. Follow these instructions at home: Take over-the-counter and prescription medicines only as told by your health care provider. Do toe-straightening and  stretching exercises as told by your health care provider. Keep all follow-up visits. This is important. How is this prevented? Wear shoes that fit properly and give your toes enough room. Shoes should not cause pain. Buy shoes at the end of the day to make sure they fit well, since your foot may swell during the day. Make sure they are comfortable before you buy them. As you age, your shoe size might change, including the width. Measure both feet and buy shoes for the larger foot. A shoe repair store might be able to stretch shoes that feel tight in spots. Do not wear high-heeled shoes or shoes with pointed toes. Contact a health care provider if: Your pain gets worse. Your toe becomes red or swollen. You develop an open sore on your toe. Summary Hammer toe is a condition that gradually causes your toe to become bent and stiff. Hammer toe can be treated by taping the toe into a straightened position and doing toe-stretching exercises. If these treatments do not help, surgery may be needed. To prevent this condition, wear shoes that fit properly, give your toes enough room, and do not cause pain. This information is not intended to replace advice given to you by your health care provider. Make sure you discuss any questions you have with your health care provider. Document Revised: 05/25/2019 Document Reviewed: 05/25/2019 Elsevier Patient Education  2024 [employer].

## 2022-09-15 ENCOUNTER — Ambulatory Visit: Payer: Medicare Other | Admitting: Psychiatry

## 2022-09-15 DIAGNOSIS — F411 Generalized anxiety disorder: Secondary | ICD-10-CM | POA: Diagnosis not present

## 2022-09-15 NOTE — Progress Notes (Signed)
[facility] Counselor/Therapist Progress Note  Patient ID: [name], MRN: [id number],    Date: 09/15/2022  Time Spent: 53 minutes   Treatment Type: Individual Therapy  Reported Symptoms: anxiety, depression  Mental Status Exam:  Appearance:   Casual     Behavior:  Appropriate, Sharing, and Motivated  Motor:  Normal  Speech/Language:   Clear and Coherent  Affect:  Depressed and anxious  Mood:  anxious and depressed  Thought process:  goal directed  Thought content:    appropriate  Sensory/Perceptual disturbances:    WNL  Orientation:  oriented to person, place, time/date, situation, day of week, month of year, year, and stated date of September 15, 2022  Attention:  Good  Concentration:  Good and Fair  Memory:  WNL  Fund of knowledge:   Good  Insight:    Good  Judgment:   Good  Impulse Control:  Good   Risk Assessment: Danger to Self:  No Self-injurious Behavior: No Danger to Others: No Duty to Warn:no Physical Aggression / Violence:No  Access to Firearms a concern: No  Gang Involvement:No   Subjective: Patient in today reporting anxiety and depression, and wearing an immobilizer boot due to broken bone right foot. ("Started out as a stress fracture that ended up in a break"). States her anxiety and depression are some worse as she is concerned about some issues that have arisen in her and her husband's communication frequently which has "worsened gradually" more recently. Needed session today to process some issues she has noticed in her aging husband including some challenges in their communication, his lack of patience, his increased irritability and not showering as often and sometimes not shaving well; symptoms which she is concerned may indicate some declining health on his part. Stated he meets his regular medical appts and a medical professional has not indicated noticing any decline. Stated she felt better being able to vent her observations and concerns and also  keeps medical personnel informed of changes.   Interventions: Cognitive Behavioral Therapy and Ego-Supportive  Long term goal: Develop healthy cognitive patterns and beliefs about self and the world that lead to alleviation of depression and anxiety, and help prevent relapse of depression and anxiety. Short term goal: Learn and implement personal skills for managing stress, solving daily problems, and resolving conflicts effectively.  Strategies: Use modeling and role-playing to help recognize anxious/depressive/negative thought patterns that create anxious/depressive/negative feelings and actions, interrupt them and replace with more positive reality-based thoughts that do not support depression.  Diagnosis:   ICD-10-CM   1. Generalized anxiety disorder  F41.1      Plan:  Patient in today for appt and working further on her worrying, anxiety, depression, and some feelings of inadequacy at times. Is making progress and needs to continue working on her goal-directed behaviors to keep moving in a forward direction. Encouraged patient to be practicing more positive/self affirming behaviors as noted in session including: Maintaining healthy boundaries with family and extended family, believing more in herself that she can make significant positive changes to feel better about her future, interrupt anxious/depressive thoughts and challenge them to replace with more realistic thoughts, get outside daily with her dog which is very therapeutic for her, staying in contact with supportive people, staying in the present focusing what she can change or control, remain connected with her church family which is very supportive of her and husband, allow her faith to be a healing resource for her emotionally as well as  spiritually, saying no when she needs to say no, positive self talk, and realize the strength she shows working with goal-directed behaviors to move in a direction that supports her improved emotional  health and outlook.  Goal review and progress/challenges noted with patient.  Next appointment within 2 to 3 weeks.   Mathis Fare, LCSW

## 2022-09-20 NOTE — Progress Notes (Signed)
Subjective: Chief Complaint  Patient presents with   Hammer Toe    Patient came in today for bilateral hammertoes, and bilateral callus on the hallux, X-Rays done today    71 year old female presents for above concerns.  She has been getting pain to both of her feet.  No recent trips she reports.  She is currently being treated by orthopedics for a fracture on the right foot and she is in a boot.   Objective: AAO x3, NAD DP/PT pulses palpable bilaterally, CRT less than 3 seconds Deformity is present with a bunion present right foot worse than left.  Not able to proceed in the area of pinpoint tenderness except in the area of the right foot where she has a known fracture.  Some mild edema in the right foot from the fracture with no other evidence of edema.  There is no erythema.  Flexor, extensor tendons are intact.  MMT 5/5. Mild callus submetatarsal without any underlying ulceration.  There are signs of infection. No pain with calf compression, swelling, warmth, erythema  Assessment: Distal deformity bilaterally. Plan: -All treatment options discussed with the patient including all alternatives, risks, complications.  -X-rays obtained reviewed bilaterally.  On the left foot evidence of previous shave osteotomy of the first metatarsal head.  Usual findings present.  There is no evidence of acute fracture bilaterally.  Tarsus adductus present right foot with healing fracture noted to the third metatarsal. -Will defer to the treating orthopedic surgeon for the right foot fracture. -Regards to her toes we discussed conservative as well as surgical treatment options.  Will continue with conservative treatment for now.  Discussed the modification, offloading pads which were dispensed today as well.  Discussed the modifications avoid excess pressure.  However we discussed surgical intervention as well. -Urea cream -Patient encouraged to call the office with any questions, concerns, change in  symptoms.   Vivi Barrack DPM

## 2022-09-22 ENCOUNTER — Encounter: Payer: Self-pay | Admitting: Psychiatry

## 2022-09-22 DIAGNOSIS — H53483 Generalized contraction of visual field, bilateral: Secondary | ICD-10-CM | POA: Diagnosis not present

## 2022-09-22 DIAGNOSIS — H0279 Other degenerative disorders of eyelid and periocular area: Secondary | ICD-10-CM | POA: Diagnosis not present

## 2022-09-22 DIAGNOSIS — Z01818 Encounter for other preprocedural examination: Secondary | ICD-10-CM | POA: Diagnosis not present

## 2022-09-22 DIAGNOSIS — H02834 Dermatochalasis of left upper eyelid: Secondary | ICD-10-CM | POA: Diagnosis not present

## 2022-09-22 DIAGNOSIS — H02835 Dermatochalasis of left lower eyelid: Secondary | ICD-10-CM | POA: Diagnosis not present

## 2022-09-22 DIAGNOSIS — H02832 Dermatochalasis of right lower eyelid: Secondary | ICD-10-CM | POA: Diagnosis not present

## 2022-09-22 DIAGNOSIS — H02831 Dermatochalasis of right upper eyelid: Secondary | ICD-10-CM | POA: Diagnosis not present

## 2022-09-22 DIAGNOSIS — H57813 Brow ptosis, bilateral: Secondary | ICD-10-CM | POA: Diagnosis not present

## 2022-09-24 ENCOUNTER — Other Ambulatory Visit: Payer: Self-pay | Admitting: Psychiatry

## 2022-09-24 DIAGNOSIS — F411 Generalized anxiety disorder: Secondary | ICD-10-CM

## 2022-09-24 DIAGNOSIS — H5203 Hypermetropia, bilateral: Secondary | ICD-10-CM | POA: Diagnosis not present

## 2022-09-24 DIAGNOSIS — F5101 Primary insomnia: Secondary | ICD-10-CM

## 2022-09-24 DIAGNOSIS — H52223 Regular astigmatism, bilateral: Secondary | ICD-10-CM | POA: Diagnosis not present

## 2022-09-24 DIAGNOSIS — Z9842 Cataract extraction status, left eye: Secondary | ICD-10-CM | POA: Diagnosis not present

## 2022-09-24 DIAGNOSIS — H3509 Other intraretinal microvascular abnormalities: Secondary | ICD-10-CM | POA: Diagnosis not present

## 2022-09-24 DIAGNOSIS — H04123 Dry eye syndrome of bilateral lacrimal glands: Secondary | ICD-10-CM | POA: Diagnosis not present

## 2022-09-24 DIAGNOSIS — H524 Presbyopia: Secondary | ICD-10-CM | POA: Diagnosis not present

## 2022-09-29 ENCOUNTER — Other Ambulatory Visit: Payer: Medicare Other

## 2022-09-29 DIAGNOSIS — F32A Depression, unspecified: Secondary | ICD-10-CM | POA: Diagnosis not present

## 2022-09-29 DIAGNOSIS — R6882 Decreased libido: Secondary | ICD-10-CM

## 2022-09-29 DIAGNOSIS — K5900 Constipation, unspecified: Secondary | ICD-10-CM | POA: Diagnosis not present

## 2022-09-29 DIAGNOSIS — F419 Anxiety disorder, unspecified: Secondary | ICD-10-CM | POA: Diagnosis not present

## 2022-09-29 DIAGNOSIS — K219 Gastro-esophageal reflux disease without esophagitis: Secondary | ICD-10-CM | POA: Diagnosis not present

## 2022-09-29 DIAGNOSIS — Z1211 Encounter for screening for malignant neoplasm of colon: Secondary | ICD-10-CM | POA: Diagnosis not present

## 2022-09-29 DIAGNOSIS — Z800 Family history of malignant neoplasm of digestive organs: Secondary | ICD-10-CM | POA: Diagnosis not present

## 2022-09-29 DIAGNOSIS — Z86010 Personal history of colonic polyps: Secondary | ICD-10-CM | POA: Diagnosis not present

## 2022-09-29 DIAGNOSIS — E039 Hypothyroidism, unspecified: Secondary | ICD-10-CM | POA: Diagnosis not present

## 2022-10-05 DIAGNOSIS — M25571 Pain in right ankle and joints of right foot: Secondary | ICD-10-CM | POA: Diagnosis not present
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
## 2022-10-06 ENCOUNTER — Encounter: Payer: Self-pay | Admitting: Obstetrics and Gynecology

## 2022-10-06 ENCOUNTER — Ambulatory Visit: Payer: Medicare Other | Admitting: Psychiatry

## 2022-10-06 ENCOUNTER — Ambulatory Visit: Payer: Medicare Other | Admitting: Obstetrics and Gynecology

## 2022-10-06 VITALS — BP 112/64 | Ht 65.0 in | Wt 164.0 lb

## 2022-10-06 DIAGNOSIS — F411 Generalized anxiety disorder: Secondary | ICD-10-CM

## 2022-10-06 DIAGNOSIS — N760 Acute vaginitis: Secondary | ICD-10-CM

## 2022-10-06 DIAGNOSIS — S92909A Unspecified fracture of unspecified foot, initial encounter for closed fracture: Secondary | ICD-10-CM | POA: Insufficient documentation

## 2022-10-06 DIAGNOSIS — R6882 Decreased libido: Secondary | ICD-10-CM | POA: Diagnosis not present

## 2022-10-06 DIAGNOSIS — S92901G Unspecified fracture of right foot, subsequent encounter for fracture with delayed healing: Secondary | ICD-10-CM

## 2022-10-06 LAB — WET PREP FOR TRICH, YEAST, CLUE

## 2022-10-06 MED ORDER — NONFORMULARY OR COMPOUNDED ITEM
2 refills | Status: DC
Start: 2022-10-06 — End: 2023-05-31

## 2022-10-06 MED ORDER — METRONIDAZOLE 0.75 % VA GEL: 1.0000 | Freq: Every day | VAGINAL | 0 refills | Status: DC

## 2022-10-06 NOTE — Patient Instructions (Signed)

## 2022-10-06 NOTE — Progress Notes (Signed)
[facility] Counselor/Therapist Progress Note  Patient ID: [name], MRN: [id number],    Date: 10/06/2022  Time Spent: 48 minutes   Treatment Type: Individual Therapy  Reported Symptoms:  anxiety, depression  Mental Status Exam:  Appearance:   Casual     Behavior:  Appropriate, Sharing, and Motivated  Motor:  Normal  Speech/Language:   Clear and Coherent  Affect:  anxious  Mood:  anxious and depressed  Thought process:  goal directed  Thought content:    WNL  Sensory/Perceptual disturbances:    WNL  Orientation:  oriented to person, place, time/date, situation, day of week, month of year, year, and stated date of October 06, 2022  Attention:  Good  Concentration:  Good  Memory:  Reports short term memory issues and Dr is aware  Fund of knowledge:   Good  Insight:    Good  Judgment:   Good  Impulse Control:  Good   Risk Assessment: Danger to Self:  No Self-injurious Behavior: No Danger to Others: No Duty to Warn:no Physical Aggression / Violence:No  Access to Firearms a concern: No  Gang Involvement:No   Subjective:  Patient in session today stating anxiety as her main symptom, along with depression. Anxiety has worsened more recently "and my depression has tended to be more situational." Processing several interpersonal situations that occurred with her husband, creating stress between patient and husband, relating to some more recent communication issues between she and husband.  Less depression but increased anxiety.  Foot continuing to heal from a break and patient is feeling more optimistic about it.  Worked more specifically on some of the communication issues between she and husband, using specific examples which seemed helpful to patient.  Shares that overall she is feeling more optimistic in some ways with their moving forward and looking for a 1 level place to live, making it more physically accessible now and into their future as they age.  Had another  marital session at the [place] office with her husband recently and that seemed helpful.  Interventions: Cognitive Behavioral Therapy and Ego-Supportive  Long term goal: Develop healthy cognitive patterns and beliefs about self and the world that lead to alleviation of depression and anxiety, and help prevent relapse of depression and anxiety. Short term goal: Learn and implement personal skills for managing stress, solving daily problems, and resolving conflicts effectively.  Strategies: Use modeling and role-playing to help recognize anxious/depressive/negative thought patterns that create anxious/depressive/negative feelings and actions, interrupt them and replace with more positive reality-based thoughts that do not support depression.  Diagnosis:   ICD-10-CM   1. Generalized anxiety disorder  F41.1      Plan:  Patient in for appointment today continuing her work on depression and anxiety as anxiety has worsened some recently. She is making progress overall and continues to work with goal-directed behaviors to move in a forward direction. Reminded and encouraged patient to be practicing more positive/self affirming behaviors as noted in session including: Believing more in herself that she can make and keep significant positive changes to feel better about her future, maintain healthy boundaries with family and extended family, interrupt anxious/depressive thoughts and challenge them to replace with more realistic thoughts, get outside daily with her dog which is very therapeutic for her, staying in contact with supportive people, remaining in the present focusing what she can change or control, remain connected with her church family which is very supportive of her and husband, allow her faith to  be a healing resource for her emotionally as well as spiritually, positive self talk, and recognize the strength she shows when working with goal-directed behaviors to move in a direction that supports her  improved emotional health and overall wellbeing.  Goal review and progress/challenges noted with patient.  Next appointment within 3 weeks.   Mathis Fare, LCSW

## 2022-10-09 ENCOUNTER — Ambulatory Visit: Payer: Medicare Other | Admitting: Podiatry

## 2022-10-09 DIAGNOSIS — M7918 Myalgia, other site: Secondary | ICD-10-CM | POA: Diagnosis not present

## 2022-10-09 DIAGNOSIS — G8929 Other chronic pain: Secondary | ICD-10-CM | POA: Diagnosis not present

## 2022-10-09 DIAGNOSIS — Z981 Arthrodesis status: Secondary | ICD-10-CM | POA: Diagnosis not present

## 2022-10-09 DIAGNOSIS — M7062 Trochanteric bursitis, left hip: Secondary | ICD-10-CM | POA: Diagnosis not present

## 2022-10-09 DIAGNOSIS — M7061 Trochanteric bursitis, right hip: Secondary | ICD-10-CM | POA: Diagnosis not present

## 2022-10-18 ENCOUNTER — Encounter: Payer: Self-pay | Admitting: Obstetrics and Gynecology

## 2022-10-19 NOTE — Telephone Encounter (Signed)
Per CS: "Left message for patient to call and schedule appointment." 

## 2022-10-19 NOTE — Telephone Encounter (Signed)
Pt scheduled for 10/20/2022 @ 830am. Routing to provider for final review and closing encounter.

## 2022-10-20 ENCOUNTER — Ambulatory Visit: Payer: Medicare Other | Admitting: Obstetrics and Gynecology

## 2022-10-20 ENCOUNTER — Ambulatory Visit (INDEPENDENT_AMBULATORY_CARE_PROVIDER_SITE_OTHER): Payer: Medicare Other | Admitting: Obstetrics and Gynecology

## 2022-10-20 ENCOUNTER — Telehealth: Payer: Self-pay | Admitting: Obstetrics and Gynecology

## 2022-10-20 ENCOUNTER — Encounter: Payer: Self-pay | Admitting: Obstetrics and Gynecology

## 2022-10-20 VITALS — BP 124/80 | HR 73 | Ht 65.0 in | Wt 164.0 lb

## 2022-10-20 DIAGNOSIS — R159 Full incontinence of feces: Secondary | ICD-10-CM | POA: Diagnosis not present

## 2022-10-20 DIAGNOSIS — N760 Acute vaginitis: Secondary | ICD-10-CM

## 2022-10-20 DIAGNOSIS — G4733 Obstructive sleep apnea (adult) (pediatric): Secondary | ICD-10-CM | POA: Diagnosis not present

## 2022-10-20 DIAGNOSIS — M5380 Other specified dorsopathies, site unspecified: Secondary | ICD-10-CM | POA: Diagnosis not present

## 2022-10-20 DIAGNOSIS — E785 Hyperlipidemia, unspecified: Secondary | ICD-10-CM | POA: Diagnosis not present

## 2022-10-20 DIAGNOSIS — B078 Other viral warts: Secondary | ICD-10-CM | POA: Diagnosis not present

## 2022-10-20 DIAGNOSIS — R42 Dizziness and giddiness: Secondary | ICD-10-CM | POA: Diagnosis not present

## 2022-10-20 DIAGNOSIS — D220 Melanocytic nevi of lip: Secondary | ICD-10-CM | POA: Diagnosis not present

## 2022-10-20 DIAGNOSIS — L565 Disseminated superficial actinic porokeratosis (DSAP): Secondary | ICD-10-CM | POA: Diagnosis not present

## 2022-10-20 DIAGNOSIS — D1801 Hemangioma of skin and subcutaneous tissue: Secondary | ICD-10-CM | POA: Diagnosis not present

## 2022-10-20 DIAGNOSIS — D2272 Melanocytic nevi of left lower limb, including hip: Secondary | ICD-10-CM | POA: Diagnosis not present

## 2022-10-20 DIAGNOSIS — M810 Age-related osteoporosis without current pathological fracture: Secondary | ICD-10-CM | POA: Diagnosis not present

## 2022-10-20 DIAGNOSIS — L821 Other seborrheic keratosis: Secondary | ICD-10-CM | POA: Diagnosis not present

## 2022-10-20 DIAGNOSIS — F0390 Unspecified dementia without behavioral disturbance: Secondary | ICD-10-CM | POA: Diagnosis not present

## 2022-10-20 DIAGNOSIS — E039 Hypothyroidism, unspecified: Secondary | ICD-10-CM | POA: Diagnosis not present

## 2022-10-20 DIAGNOSIS — L700 Acne vulgaris: Secondary | ICD-10-CM | POA: Diagnosis not present

## 2022-10-20 DIAGNOSIS — E663 Overweight: Secondary | ICD-10-CM | POA: Diagnosis not present

## 2022-10-20 DIAGNOSIS — D2261 Melanocytic nevi of right upper limb, including shoulder: Secondary | ICD-10-CM | POA: Diagnosis not present

## 2022-10-20 DIAGNOSIS — F329 Major depressive disorder, single episode, unspecified: Secondary | ICD-10-CM | POA: Diagnosis not present

## 2022-10-20 DIAGNOSIS — K219 Gastro-esophageal reflux disease without esophagitis: Secondary | ICD-10-CM | POA: Diagnosis not present

## 2022-10-20 DIAGNOSIS — M1990 Unspecified osteoarthritis, unspecified site: Secondary | ICD-10-CM | POA: Diagnosis not present

## 2022-10-20 DIAGNOSIS — I872 Venous insufficiency (chronic) (peripheral): Secondary | ICD-10-CM | POA: Diagnosis not present

## 2022-10-20 LAB — WET PREP FOR TRICH, YEAST, CLUE

## 2022-10-20 NOTE — Patient Instructions (Signed)
 Fecal Incontinence Fecal incontinence, also called accidental bowel leakage, is an inability to control your bowels. This means stool (feces) leaks from the rectum. This condition happens because the nerves or muscles around the anus do not work the way they should. The anus is the opening between the buttocks. What are the causes? This condition may be caused by: Damage to the muscles at the end of the rectum (sphincter). Damage to the nerves that control bowel movements. Diarrhea. Chronic constipation. Pelvic floor dysfunction. This means the muscles in the pelvis do not work well. Loss of bowel storage capacity. This occurs when the rectum can no longer stretch in size in order to store feces. Inflammatory bowel disease (IBD), such as Crohn's disease. Irritable bowel syndrome (IBS). What increases the risk? You are more likely to develop this condition if: You were born with bowels or a pelvis that did not form correctly. You have had rectal surgery. You have had radiation treatment for certain cancers. You have been pregnant, had a vaginal delivery, or had surgery that damaged the pelvic floor muscles. You have nerve damage that affects the rectum or anus: You had a complicated childbirth, spinal cord injury, or other trauma that caused nerve damage. You have a condition that can affect nerve function, such as diabetes, Parkinson's disease, or multiple sclerosis (MS). You have a condition where the rectum drops down into the anus or vagina (prolapse). You are 89 years of age or older. What are the signs or symptoms? The main symptom of this condition is not being able to control your bowels. You also might not be able to get to the bathroom before a bowel movement. Other symptoms may include: Diarrhea. Constipation. Discomfort in the abdomen. How is this diagnosed? This condition is diagnosed with a medical history and physical exam. You may also have other tests, including: Blood  and urine tests. A rectal exam. Ultrasound or MRI. Defecography. This is a test that uses a special liquid to see how the rectum and anus perform during a bowel movement. Colonoscopy. This is an exam that looks at your large intestine (colon). Anal manometry. This is a test that measures the strength of the anal sphincter. Anal electromyogram (EMG). This is a test that uses small electrodes to check for nerve damage. How is this treated? Treatment for this condition depends on the cause and severity. Treatment may also focus on addressing any underlying causes of this condition. Treatment may include: Medicines, such as medicines to: Prevent diarrhea. Help with constipation (bulk-forming laxatives). Treat any underlying conditions. Biofeedback therapy. This can help retrain muscles that are affected. Fiber supplements. These can help manage your bowel movements. Nerve stimulation. An injectable gel to promote tissue growth and better muscle control. Surgery. You may need: Sphincter repair surgery. Diversion surgery. This procedure lets feces pass out of your body through a hole in your abdomen. Follow these instructions at home: Eating and drinking  Follow instructions from your health care provider about what you may eat and drink. Work with a dietitian to help you avoid foods and drinks that can make your condition worse. These include: Spicy foods. Fatty and greasy foods. Cured or smoked meat. Dairy products. Drinks with caffeine in them. Keep a diet diary to find out which foods or drinks could be making your condition worse. Do not drink alcohol. Drink enough fluid to keep your urine pale yellow. Lifestyle Do not use any products that contain nicotine or tobacco. These products include cigarettes, chewing tobacco,  and vaping devices, such as e-cigarettes. If you need help quitting, ask your health care provider. If you are overweight, talk with your health care provider about  how to safely lose weight. This may help your condition. Increase your physical activity as told by your health care provider. This may help your condition. Always talk with your health care provider before starting a new exercise program. Carry a change of clothes and supplies to clean up quickly if you have an episode of fecal incontinence. Consider joining a fecal incontinence support group. You can find a support group online or in your local community. General instructions  Take over-the-counter and prescription medicines only as told by your health care provider. This includes any supplements. Apply a moisture barrier, such as petroleum jelly, to the anus. This protects the skin from irritation caused by ongoing leaking or diarrhea. Tell your health care provider if you are upset or depressed about your condition. Where to find more information International [employer] for Functional Gastrointestinal Disorders: iffgd.org [employer]: patients.gi.org Contact a health care provider if: You have a fever. You have redness, swelling, or pain around your anus. Your pain is getting worse or you lose feeling in your rectal area. You have blood in your stool. You feel sad or hopeless. You avoid social or work situations. Get help right away if: You stop having bowel movements. You cannot eat or drink without vomiting. You have rectal bleeding that does not stop. You have severe pain that is getting worse. You have symptoms of dehydration, including: Drowsiness or tiredness (fatigue). Little or no urine, tears, or sweat. Dizziness. Dry mouth. Unusual irritability. Headache. Inability to think clearly. Summary Fecal incontinence, also called accidental bowel leakage, is an inability to control your bowels. This condition happens because the nerves or muscles around the anus do not work the way they should. Treatment depends on the cause and the severity of the  symptoms. Follow instructions from your health care provider about what you may eat and drink, lifestyle changes, and skin care. Take over-the-counter and prescription medicines only as told by your health care provider. Tell your health care provider if your symptoms worsen or if you are upset or depressed about your condition. This information is not intended to replace advice given to you by your health care provider. Make sure you discuss any questions you have with your health care provider. Document Revised: 05/13/2021 Document Reviewed: 05/13/2021 Elsevier Patient Education  2024 [employer].

## 2022-10-20 NOTE — Telephone Encounter (Signed)
Please make a referral to [facility] for my patient who has fecal incontinence.
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
## 2022-10-20 NOTE — Telephone Encounter (Signed)
Ambulatory referral placed for physical therapy for pelvic floor PT.   Routing to Dr. Marjorie Smolder  Encounter closed.   Cc: Marena Chancy

## 2022-10-23 DIAGNOSIS — H53483 Generalized contraction of visual field, bilateral: Secondary | ICD-10-CM | POA: Diagnosis not present

## 2022-10-26 ENCOUNTER — Ambulatory Visit (INDEPENDENT_AMBULATORY_CARE_PROVIDER_SITE_OTHER): Payer: Medicare Other | Admitting: Psychiatry

## 2022-10-26 DIAGNOSIS — F411 Generalized anxiety disorder: Secondary | ICD-10-CM | POA: Diagnosis not present

## 2022-10-26 DIAGNOSIS — Z96652 Presence of left artificial knee joint: Secondary | ICD-10-CM | POA: Diagnosis not present

## 2022-10-26 DIAGNOSIS — M25551 Pain in right hip: Secondary | ICD-10-CM | POA: Diagnosis not present

## 2022-10-26 NOTE — Progress Notes (Signed)
[facility] Counselor/Therapist Progress Note  Patient ID: [name], MRN: [id number],    Date: 10/26/2022  Time Spent: 50 minutes   Treatment Type: Individual Therapy  Reported Symptoms: Anxiety, some depression, "working on difficult family issues"  Mental Status Exam:  Appearance:   Casual     Behavior:  Appropriate, Sharing, and Motivated  Motor:  Normal  Speech/Language:   Normal Rate  Affect:  Depressed and anxious  Mood:  anxious  Thought process:  goal directed  Thought content:    WNL  Sensory/Perceptual disturbances:    WNL  Orientation:  oriented to person, place, time/date, situation, day of week, month of year, year, and stated date of Aug. 26, 2024  Attention:  Good  Concentration:  Good  Memory:  WNL  Fund of knowledge:   Good  Insight:    Good  Judgment:   Good  Impulse Control:  Good   Risk Assessment: Danger to Self:  No Self-injurious Behavior: No Danger to Others: No Duty to Warn:no Physical Aggression / Violence:No  Access to Firearms a concern: No  Gang Involvement:No   Subjective: Patient in session today stating her main symptoms as anxiety and depression.  Is trying to help husband work on some decision making about their future and this naturally brings some stress with it.  Talked about this in more detail today as well as ways she can take better care of herself particularly in the midst of stress and some uncertainty.  Processed today some more interpersonal situations with husband and others within the family which seemed helpful to patient especially regarding one of her adult daughters.  (Not all details included in this note due to patient privacy needs).  Anxiety is not quite as bad as in recent weeks per her report.  Still experiencing some interpersonal issues in relationship with husband and some of their communication.  Did encourage patient and practicing more active listening that might be helpful with husband, based on the  information she has shared today.  Also talked about some specific examples that may further help their communication.  Looked at some coping skills for her when the anxiety worsens, including more encouraging positive self talk and her realization in some of her coping skills that have improved over time.  Encouraged her to have more connectedness with friends and also some time just for herself.  Interventions: Cognitive Behavioral Therapy and Ego-Supportive  Long term goal: Develop healthy cognitive patterns and beliefs about self and the world that lead to alleviation of depression and anxiety, and help prevent relapse of depression and anxiety. Short term goal: Learn and implement personal skills for managing stress, solving daily problems, and resolving conflicts effectively.  Strategies: Use modeling and role-playing to help recognize anxious/depressive/negative thought patterns that create anxious/depressive/negative feelings and actions, interrupt them and replace with more positive reality-based thoughts that do not support depression.  Diagnosis:   ICD-10-CM   1. Generalized anxiety disorder  F41.1      Plan: Patient in appointment today and working further on her anxiety, some depression, and some specific anxiety about some decision making for their future particularly regarding health concerns.  Patient is making progress and needs to continue working with goal-directed behaviors in order to keep moving in a forward and hopeful direction. Reminded and encouraged patient to be practicing more positive and self affirming behaviors as noted in session including: Having more belief in herself that she can make and keep significant positive  changes to feel better about her future, maintain healthy boundaries with family and extended family, interrupt anxious/depressive thoughts and challenge them to replace with more realistic thoughts, get outside daily with her dog which is very  therapeutic for her, staying in contact with supportive people, remaining in the present focusing on what she can change or control, remain connected with her church family which is very supportive of her and husband, allow her faith to be a healing resource for her emotionally as well as spiritually, use of positive self talk, and realize the strength she shows when working with goal-directed behaviors to move in a direction that supports her improved emotional health and outlook into the future.  Goal review and progress/challenges noted with patient.  Next appointment within 3 weeks.   Mathis Fare, LCSW

## 2022-10-28 DIAGNOSIS — Z1211 Encounter for screening for malignant neoplasm of colon: Secondary | ICD-10-CM | POA: Diagnosis not present

## 2022-10-28 DIAGNOSIS — Z86010 Personal history of colonic polyps: Secondary | ICD-10-CM | POA: Diagnosis not present

## 2022-10-28 DIAGNOSIS — K6389 Other specified diseases of intestine: Secondary | ICD-10-CM | POA: Diagnosis not present

## 2022-10-28 DIAGNOSIS — Z800 Family history of malignant neoplasm of digestive organs: Secondary | ICD-10-CM | POA: Diagnosis not present

## 2022-10-28 LAB — HM COLONOSCOPY

## 2022-11-03 DIAGNOSIS — Z1231 Encounter for screening mammogram for malignant neoplasm of breast: Secondary | ICD-10-CM | POA: Diagnosis not present

## 2022-11-04 ENCOUNTER — Encounter: Payer: Self-pay | Admitting: Obstetrics and Gynecology

## 2022-11-04 DIAGNOSIS — G43019 Migraine without aura, intractable, without status migrainosus: Secondary | ICD-10-CM | POA: Diagnosis not present

## 2022-11-06 DIAGNOSIS — M7918 Myalgia, other site: Secondary | ICD-10-CM | POA: Diagnosis not present

## 2022-11-12 DIAGNOSIS — H04123 Dry eye syndrome of bilateral lacrimal glands: Secondary | ICD-10-CM | POA: Diagnosis not present

## 2022-11-16 ENCOUNTER — Ambulatory Visit (INDEPENDENT_AMBULATORY_CARE_PROVIDER_SITE_OTHER): Payer: Medicare Other | Admitting: Psychiatry

## 2022-11-16 DIAGNOSIS — S92331D Displaced fracture of third metatarsal bone, right foot, subsequent encounter for fracture with routine healing: Secondary | ICD-10-CM | POA: Diagnosis not present

## 2022-11-16 DIAGNOSIS — F411 Generalized anxiety disorder: Secondary | ICD-10-CM | POA: Diagnosis not present

## 2022-11-16 NOTE — Progress Notes (Signed)
[facility] Counselor/Therapist Progress Note  Patient ID: MATHEA AZLIN, MRN: [id number],    Date: 11/16/2022  Time Spent: 55 minutes   Treatment Type: Individual Therapy  Reported Symptoms: anxiety, "decreased depression"   Mental Status Exam:  Appearance:   Casual     Behavior:  Appropriate, Sharing, and Motivated  Motor:  Normal  Speech/Language:   Clear and Coherent  Affect:  anxious  Mood:  anxious  Thought process:  goal directed  Thought content:    WNL  Sensory/Perceptual disturbances:    WNL  Orientation:  oriented to person, place, time/date, situation, day of week, month of year, year, and stated date of Sept. 16, 2024  Attention:  Good  Concentration:  Good  Memory:  Some occasional short term memory issues and Dr is aware  Fund of knowledge:   Good  Insight:    Good  Judgment:   Good  Impulse Control:  Good   Risk Assessment: Danger to Self:  No Self-injurious Behavior: No Danger to Others: No Duty to Warn:no Physical Aggression / Violence:No  Access to Firearms a concern: No  Gang Involvement:No   Subjective:  Patient in today reporting symptoms of anxiety, some increased stress, but "decreased depression". Feels she is doing some better emotionally and physically. Has been very stressed with their long time pet dog's illness recently. Significant communication issues with husband, especially in day to day talking. Discussed some strategies that can help their communication issues and their overall relationship.  Patient acknowledged that they have been under more stress recently as they had bought a townhome and will be moving within approximately a month so are feeling some stress with all of this, although do have some input as to how soon but also acknowledged that they may go with the company that "packs and moves you" which would make for less stress for she and her husband.  Am encouraged that the depression is not as strong.  Reviewed from last  session some of the active listening skills that can be helpful during this time for them, and being able to take "timeouts".  Does plan to connect more with friends in the coming days and adds that that usually helps her stress as well.  Interventions: Cognitive Behavioral Therapy and Ego-Supportive  Long term goal: Develop healthy cognitive patterns and beliefs about self and the world that lead to alleviation of depression and anxiety, and help prevent relapse of depression and anxiety. Short term goal: Learn and implement personal skills for managing stress, solving daily problems, and resolving conflicts effectively.  Strategies: Use modeling and role-playing to help recognize anxious/depressive/negative thought patterns that create anxious/depressive/negative feelings and actions, interrupt them and replace with more positive reality-based thoughts that do not support depression.  Diagnosis:   ICD-10-CM   1. Generalized anxiety disorder  F41.1      Plan:  Patient in for her appointment today and continues working on her "generalized anxiety" as well as anxiety that is more specific to her and her husband's planning for their future as they are both retired, have some health concerns and are needing to be able to make some decisions going forward.  Depression has decreased. Patient is making progress and needs to continue working with goal-directed behaviors in order to keep moving and a forward direction. Reminded and encouraged patient to be practicing more positive and self affirming behaviors as noted in sessions including: Having more belief in herself that she can make and keep  significant positive changes to feel better about herself and her future, maintain healthy boundaries with family and extended family, interrupt anxious/ some depressive thoughts and challenge them to replace with more realistic thoughts, get outside daily with her dog which is very therapeutic for her, stay in  contact with supportive people, stay in the present focusing on what she can change, remain connected with her church family which is very supportive of her and husband, allow her faith to be a healing resource for her emotionally as well as spiritually, positive self talk, and recognize the strength she shows when working with goal-directed behaviors to move in a direction that supports her improved emotional health and overall wellbeing.  Goal review and progress/challenges noted with patient.  Next appointment within 3 weeks.   Mathis Fare, LCSW

## 2022-11-19 ENCOUNTER — Ambulatory Visit: Payer: Medicare Other | Admitting: Psychiatry

## 2022-11-21 ENCOUNTER — Encounter: Payer: Self-pay | Admitting: Neurology

## 2022-12-05 DIAGNOSIS — Z23 Encounter for immunization: Secondary | ICD-10-CM | POA: Diagnosis not present

## 2022-12-07 ENCOUNTER — Other Ambulatory Visit: Payer: Self-pay | Admitting: *Deleted

## 2022-12-07 ENCOUNTER — Telehealth: Payer: Medicare Other | Admitting: Psychiatry

## 2022-12-07 DIAGNOSIS — M47816 Spondylosis without myelopathy or radiculopathy, lumbar region: Secondary | ICD-10-CM | POA: Diagnosis not present

## 2022-12-07 DIAGNOSIS — M7061 Trochanteric bursitis, right hip: Secondary | ICD-10-CM | POA: Diagnosis not present

## 2022-12-07 DIAGNOSIS — I872 Venous insufficiency (chronic) (peripheral): Secondary | ICD-10-CM

## 2022-12-07 DIAGNOSIS — M7918 Myalgia, other site: Secondary | ICD-10-CM | POA: Diagnosis not present

## 2022-12-07 DIAGNOSIS — M7062 Trochanteric bursitis, left hip: Secondary | ICD-10-CM | POA: Diagnosis not present

## 2022-12-07 DIAGNOSIS — G8929 Other chronic pain: Secondary | ICD-10-CM | POA: Diagnosis not present

## 2022-12-08 ENCOUNTER — Other Ambulatory Visit: Payer: Self-pay | Admitting: Psychiatry

## 2022-12-08 DIAGNOSIS — F5101 Primary insomnia: Secondary | ICD-10-CM

## 2022-12-08 DIAGNOSIS — F411 Generalized anxiety disorder: Secondary | ICD-10-CM

## 2022-12-10 ENCOUNTER — Ambulatory Visit: Payer: Medicare Other | Admitting: Psychiatry

## 2022-12-14 ENCOUNTER — Other Ambulatory Visit: Payer: Self-pay | Admitting: Psychiatry

## 2022-12-14 ENCOUNTER — Ambulatory Visit (HOSPITAL_COMMUNITY)
Admission: RE | Admit: 2022-12-14 | Discharge: 2022-12-14 | Disposition: A | Discharge status: Home / Self Care | Payer: Medicare Other | Source: Ambulatory Visit | Attending: Vascular Surgery | Admitting: Vascular Surgery

## 2022-12-14 DIAGNOSIS — I872 Venous insufficiency (chronic) (peripheral): Secondary | ICD-10-CM | POA: Diagnosis present

## 2022-12-14 DIAGNOSIS — F5101 Primary insomnia: Secondary | ICD-10-CM

## 2022-12-15 ENCOUNTER — Ambulatory Visit (INDEPENDENT_AMBULATORY_CARE_PROVIDER_SITE_OTHER): Payer: Medicare Other | Admitting: Psychiatry

## 2022-12-15 DIAGNOSIS — F411 Generalized anxiety disorder: Secondary | ICD-10-CM | POA: Diagnosis not present

## 2022-12-15 DIAGNOSIS — R350 Frequency of micturition: Secondary | ICD-10-CM | POA: Diagnosis not present

## 2022-12-15 DIAGNOSIS — N3946 Mixed incontinence: Secondary | ICD-10-CM | POA: Diagnosis not present

## 2022-12-15 NOTE — Progress Notes (Signed)
[facility] Counselor/Therapist Progress Note  Patient ID: [name], MRN: [id number],    Date: 12/15/2022  Time Spent:  55 minutes  Treatment Type: Individual Therapy  Reported Symptoms: anxiety, "depression and sadness/grief/tearfulness" due to loss of their long-time dog  Mental Status Exam:  Appearance:   Neat     Behavior:  Appropriate, Sharing, and Motivated  Motor:  Normal  Speech/Language:   Clear and Coherent  Affect:  Depressed, Tearful, and sad due to dog's death  Mood:  anxious, depressed, and sad  Thought process:  goal directed  Thought content:    WNL  Sensory/Perceptual disturbances:    WNL  Orientation:  oriented to person, place, time/date, situation, day of week, month of year, year, and stated date of Oct. 15, 2024  Attention:  Good  Concentration:  Good  Memory:  Some  short term memory issues  Fund of knowledge:   Good  Insight:    Good  Judgment:   Good  Impulse Control:  Good   Risk Assessment: Danger to Self:  No Self-injurious Behavior: No Danger to Others: No Duty to Warn:no Physical Aggression / Violence:No  Access to Firearms a concern: No  Gang Involvement:No   Subjective:   Patient in today for reporting anxiety, sadness and grief over dog dying this past week, and the stress of moving into their townhome this past week. Tearfully shared and processed her grief over the death of her dog that she has had 8+ years. Shared and talked through her sadness and sense of loss. Some vision issues concerning patient due to "eyelid overlapping" and is worried although is to meet the surgeon within a week and talk more about it. Has been very stressed with dog's death, and the"grief and tears have been very frequent". Stated she felt some better today after talking more today and letting her self be more free with her emotions which she also recognizes is part of her healing.   Interventions: Cognitive Behavioral Therapy, Ego-Supportive, and  Grief Therapy  Long term goal: Develop healthy cognitive patterns and beliefs about self and the world that lead to alleviation of depression and anxiety, and help prevent relapse of depression and anxiety. Short term goal: Learn and implement personal skills for managing stress, solving daily problems, and resolving conflicts effectively.  Strategies: Use modeling and role-playing to help recognize anxious/depressive/negative thought patterns that create anxious/depressive/negative feelings and actions, interrupt them and replace with more positive reality-based thoughts that do not support depression.  Diagnosis:   ICD-10-CM   1. Generalized anxiety disorder  F41.1      Plan:   Patient today continuing to show good motivation and participation in session working further on her anxiety but today more so on some sadness and grief due to the loss of her 88-year-old dog this past weekend.  Focused a lot on her grief today and did some really good work and sharing her thoughts and feelings and and participating as we spoke about ways that she can continue to manage her grief and ways that support her overall health and her healing.  Patient is progressing and needs to continue her work with goal-directed behaviors to keep moving and a healthier and more hopeful direction. Reminded and encouraged patient to be practicing more positive and self affirming behaviors as noted in sessions including: Having more belief in herself that she can make and keep significant positive changes to feel better about herself and her future, maintain healthy boundaries  with family and extended family, interrupt anxious/depressive thoughts and challenge them and replace with more realistic thoughts, get outside daily with her dog which is very therapeutic for her, stay in contact with supportive people, remain in the present focusing on what she can change her control, remain connected with her church family which is very  supportive of her and husband, allow her faith to be a healing resource for her emotionally as well as spiritually, positive self talk, and realize the strength she shows when working with goal-directed behaviors to move in a direction that supports her improved emotional health and her outlook into the future.  Goal review and progress/challenges noted with patient.  Next appointment within 2 to 3 weeks.   Mathis Fare, LCSW

## 2022-12-19 ENCOUNTER — Other Ambulatory Visit: Payer: Self-pay | Admitting: Psychiatry

## 2022-12-19 DIAGNOSIS — F330 Major depressive disorder, recurrent, mild: Secondary | ICD-10-CM

## 2022-12-19 DIAGNOSIS — F411 Generalized anxiety disorder: Secondary | ICD-10-CM

## 2022-12-20 NOTE — Telephone Encounter (Signed)
Has appt 10/23

## 2022-12-21 DIAGNOSIS — H57813 Brow ptosis, bilateral: Secondary | ICD-10-CM | POA: Diagnosis not present

## 2022-12-21 DIAGNOSIS — H02831 Dermatochalasis of right upper eyelid: Secondary | ICD-10-CM | POA: Diagnosis not present

## 2022-12-21 DIAGNOSIS — H02835 Dermatochalasis of left lower eyelid: Secondary | ICD-10-CM | POA: Diagnosis not present

## 2022-12-21 DIAGNOSIS — H02832 Dermatochalasis of right lower eyelid: Secondary | ICD-10-CM | POA: Diagnosis not present

## 2022-12-21 DIAGNOSIS — H02834 Dermatochalasis of left upper eyelid: Secondary | ICD-10-CM | POA: Diagnosis not present

## 2022-12-21 DIAGNOSIS — H0279 Other degenerative disorders of eyelid and periocular area: Secondary | ICD-10-CM | POA: Diagnosis not present

## 2022-12-21 DIAGNOSIS — Z01818 Encounter for other preprocedural examination: Secondary | ICD-10-CM | POA: Diagnosis not present

## 2022-12-21 DIAGNOSIS — H53483 Generalized contraction of visual field, bilateral: Secondary | ICD-10-CM | POA: Diagnosis not present
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
## 2022-12-23 ENCOUNTER — Encounter: Payer: Self-pay | Admitting: Psychiatry

## 2022-12-23 ENCOUNTER — Telehealth (INDEPENDENT_AMBULATORY_CARE_PROVIDER_SITE_OTHER): Payer: Medicare Other | Admitting: Psychiatry

## 2022-12-23 DIAGNOSIS — F5101 Primary insomnia: Secondary | ICD-10-CM | POA: Diagnosis not present

## 2022-12-23 DIAGNOSIS — F411 Generalized anxiety disorder: Secondary | ICD-10-CM

## 2022-12-23 DIAGNOSIS — F330 Major depressive disorder, recurrent, mild: Secondary | ICD-10-CM

## 2022-12-23 MED ORDER — BUSPIRONE HCL 10 MG PO TABS
10.0000 mg | ORAL_TABLET | Freq: Two times a day (BID) | ORAL | 1 refills | Status: DC
Start: 2022-12-23 — End: 2023-04-09

## 2022-12-23 MED ORDER — DULOXETINE HCL 60 MG PO CPEP: 60.0000 mg | ORAL_CAPSULE | Freq: Every day | ORAL | 1 refills | Status: DC

## 2022-12-23 MED ORDER — LORAZEPAM 0.5 MG PO TABS: ORAL_TABLET | ORAL | 1 refills | Status: DC
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
## 2022-12-25 ENCOUNTER — Ambulatory Visit (INDEPENDENT_AMBULATORY_CARE_PROVIDER_SITE_OTHER): Payer: Medicare Other | Admitting: Vascular Surgery

## 2022-12-25 ENCOUNTER — Encounter: Payer: Self-pay | Admitting: Vascular Surgery

## 2022-12-25 VITALS — BP 116/78 | HR 65 | Temp 97.9°F | Resp 20 | Ht 65.0 in | Wt 160.0 lb

## 2022-12-25 DIAGNOSIS — I872 Venous insufficiency (chronic) (peripheral): Secondary | ICD-10-CM

## 2022-12-28 DIAGNOSIS — H538 Other visual disturbances: Secondary | ICD-10-CM | POA: Diagnosis not present

## 2022-12-28 DIAGNOSIS — H02835 Dermatochalasis of left lower eyelid: Secondary | ICD-10-CM | POA: Diagnosis not present

## 2022-12-28 DIAGNOSIS — H02831 Dermatochalasis of right upper eyelid: Secondary | ICD-10-CM | POA: Diagnosis not present

## 2022-12-28 DIAGNOSIS — H02832 Dermatochalasis of right lower eyelid: Secondary | ICD-10-CM | POA: Diagnosis not present

## 2022-12-28 DIAGNOSIS — Z01818 Encounter for other preprocedural examination: Secondary | ICD-10-CM | POA: Diagnosis not present

## 2022-12-28 DIAGNOSIS — H53483 Generalized contraction of visual field, bilateral: Secondary | ICD-10-CM | POA: Diagnosis not present

## 2022-12-28 DIAGNOSIS — H0279 Other degenerative disorders of eyelid and periocular area: Secondary | ICD-10-CM | POA: Diagnosis not present

## 2022-12-28 DIAGNOSIS — H57813 Brow ptosis, bilateral: Secondary | ICD-10-CM | POA: Diagnosis not present

## 2022-12-28 DIAGNOSIS — H02834 Dermatochalasis of left upper eyelid: Secondary | ICD-10-CM | POA: Diagnosis not present

## 2022-12-28 DIAGNOSIS — L987 Excessive and redundant skin and subcutaneous tissue: Secondary | ICD-10-CM | POA: Diagnosis not present

## 2022-12-30 ENCOUNTER — Telehealth: Payer: Self-pay | Admitting: Psychiatry

## 2022-12-30 NOTE — Telephone Encounter (Deleted)
LF 1/09

## 2022-12-30 NOTE — Telephone Encounter (Signed)
Patient called in stating that she picked up her prescription for Lorazepam 10/23 and she has moved and cannot find pills. She is asking for refill that will get her through to 11/9 when she will able to get a refill. Ph: [phone] [facility] [address] [place]

## 2022-12-31 ENCOUNTER — Other Ambulatory Visit: Payer: Self-pay

## 2022-12-31 DIAGNOSIS — F411 Generalized anxiety disorder: Secondary | ICD-10-CM

## 2022-12-31 DIAGNOSIS — F5101 Primary insomnia: Secondary | ICD-10-CM

## 2022-12-31 MED ORDER — LORAZEPAM 0.5 MG PO TABS: ORAL_TABLET | ORAL | 0 refills | Status: DC

## 2022-12-31 NOTE — Telephone Encounter (Signed)
Pended.

## 2022-12-31 NOTE — Telephone Encounter (Signed)
Pt moved and reporting she lost her lorazepam. Last filled 10/9, due 11/6. She is asking if we can send in enough to get her to her next RF. I will pend as appropriate.

## 2022-12-31 NOTE — Telephone Encounter (Signed)
Addendum to attached message. [name] called again in a panic about losing her Lorazepam. She wants to know if she could possibly get some samples of Lorazepam up until 11/9 when she can get her RX called in for a refill? Her phone number is [phone]. Pharmacy is [facility], [address] [place], [place], [place]. Phone number is [phone].

## 2023-01-04 ENCOUNTER — Telehealth: Payer: Self-pay | Admitting: Psychiatry

## 2023-01-04 ENCOUNTER — Other Ambulatory Visit: Payer: Self-pay

## 2023-01-04 ENCOUNTER — Ambulatory Visit (INDEPENDENT_AMBULATORY_CARE_PROVIDER_SITE_OTHER): Payer: Medicare Other | Admitting: Psychiatry

## 2023-01-04 DIAGNOSIS — F411 Generalized anxiety disorder: Secondary | ICD-10-CM | POA: Diagnosis not present

## 2023-01-04 NOTE — Telephone Encounter (Signed)
See note from 10/30. [name] is out of her Lorazepam now and wants to get a rf til appt Dec 2nd. She made appt 12/2. [facility] [id number] - [place], [place] - [address]  [address] [place]

## 2023-01-04 NOTE — Progress Notes (Signed)
[facility] Counselor/Therapist Progress Note  Patient ID: [name], MRN: [id number],    Date: 01/04/2023  Time Spent: 53 minutes   Treatment Type: Individual Therapy  Reported Symptoms: anxiety, depression, tearfulness, sadness/grief due to loss of their dog   Mental Status Exam:  Appearance:   Casual     Behavior:  Appropriate, Sharing, and Motivated  Motor:  Normal  Speech/Language:   Clear and Coherent  Affect:  Depressed and anxious  Mood:  anxious and depressed  Thought process:  goal directed  Thought content:    WNL  Sensory/Perceptual disturbances:    WNL  Orientation:  oriented to person, place, time/date, situation, day of week, month of year, year, and stated date of Nov. 4, 2024  Attention:  Good  Concentration:  Good and Fair  Memory:  WNL  Fund of knowledge:   Good  Insight:    Good  Judgment:   Good  Impulse Control:  Good   Risk Assessment: Danger to Self:  No Self-injurious Behavior: No Danger to Others: No Duty to Warn:no Physical Aggression / Violence:No  Access to Firearms a concern: No  Gang Involvement:No   Subjective: Patient today reporting sadness, anxious, grief, tearfulness. Still grieving loss of her dog. Moved into her new townhome and feeling good about it. Confronting more interpersonal issues between her and older daughter and processed this more today. Patient in midst of considering  new dog after they lost her previous dog due to illness recently. Worked more specifically on her grief, sadness, anxiety, and relationship concerns with older daughter.  Talking very openly today particularly regarding grief issues in relationship with husband, and the difficult, angry ways that daughter responds to patient.  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  Continues her grief work regarding their dog of 8 years that died recently, and is making very  gradual progress.  Interventions: Cognitive Behavioral Therapy and Ego-Supportive  Long term goal: Develop healthy cognitive patterns and beliefs about self and the world that lead to alleviation of depression and anxiety, and help prevent relapse of depression and anxiety. Short term goal: Learn and implement personal skills for managing stress, solving daily problems, and resolving conflicts effectively.  Strategies: Use modeling and role-playing to help recognize anxious/depressive/negative thought patterns that create anxious/depressive/negative feelings and actions, interrupt them and replace with more positive reality-based thoughts that do not support depression.  Diagnosis:   ICD-10-CM   1. Generalized anxiety disorder  F41.1      Plan: Patient today working intentionally on her anxiety, tearfulness, and sadness regarding multiple family issues, and also trying to strengthen the relationship with her husband who is having some issues as he ages, emotionally and physically.  Sharing her grief and frustrations very openly which helps her and her overall health and healing.  They have gotten moved into the new townhome and are still in the process of getting "settled" and are happy things have worked out for them. Reminded and encouraged patient in her practice of more positive and self affirming behaviors as noted in sessions including: Interrupting anxious/depressive thoughts and challenge them and replace with more realistic thoughts, believing in herself more that she can make and keep significant positive changes to feel better about herself and her future, healthy boundaries with family and others, contact with supportive people, stay in the present focusing on what she can change or control, remain  connected within her church family which is very supportive of her and husband, allow her faith to be a healing resource for her emotionally as well as spiritually, use of positive self talk,  and recognize the strength she shows when working with goal-directed behaviors to move in a direction that supports her improved emotional health and her overall wellbeing.  Patient is progressing and needs to continue her work with goal-directed behaviors to keep moving in a healthier and more hopeful direction.  Goal review and progress/challenges noted with patient.  Next appointment within 3 to 4 weeks.   Mathis Fare, LCSW

## 2023-01-04 NOTE — Telephone Encounter (Signed)
Called pharmacy. They will fill new Rx on 11/9. Patient is aware. A short supply was sent in on 10/31. The pharmacy said they already have scheduled to fill #120 tablets on 11/9.

## 2023-01-05 DIAGNOSIS — M25562 Pain in left knee: Secondary | ICD-10-CM | POA: Diagnosis not present

## 2023-01-05 DIAGNOSIS — M25571 Pain in right ankle and joints of right foot: Secondary | ICD-10-CM | POA: Diagnosis not present

## 2023-01-06 ENCOUNTER — Ambulatory Visit: Payer: Medicare Other | Attending: Obstetrics and Gynecology

## 2023-01-06 ENCOUNTER — Other Ambulatory Visit: Payer: Self-pay

## 2023-01-06 DIAGNOSIS — R279 Unspecified lack of coordination: Secondary | ICD-10-CM | POA: Diagnosis not present

## 2023-01-06 DIAGNOSIS — R293 Abnormal posture: Secondary | ICD-10-CM | POA: Insufficient documentation

## 2023-01-06 DIAGNOSIS — R159 Full incontinence of feces: Secondary | ICD-10-CM | POA: Diagnosis not present

## 2023-01-06 DIAGNOSIS — M6281 Muscle weakness (generalized): Secondary | ICD-10-CM | POA: Diagnosis not present

## 2023-01-06 DIAGNOSIS — M5459 Other low back pain: Secondary | ICD-10-CM | POA: Diagnosis not present
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
## 2023-01-08 DIAGNOSIS — Z96652 Presence of left artificial knee joint: Secondary | ICD-10-CM | POA: Diagnosis not present

## 2023-01-08 DIAGNOSIS — Z4889 Encounter for other specified surgical aftercare: Secondary | ICD-10-CM | POA: Diagnosis not present

## 2023-01-11 DIAGNOSIS — K449 Diaphragmatic hernia without obstruction or gangrene: Secondary | ICD-10-CM | POA: Diagnosis not present

## 2023-01-11 DIAGNOSIS — K219 Gastro-esophageal reflux disease without esophagitis: Secondary | ICD-10-CM | POA: Diagnosis not present

## 2023-01-11 DIAGNOSIS — R1310 Dysphagia, unspecified: Secondary | ICD-10-CM | POA: Diagnosis not present

## 2023-01-11 DIAGNOSIS — R059 Cough, unspecified: Secondary | ICD-10-CM | POA: Diagnosis not present

## 2023-01-11 DIAGNOSIS — R079 Chest pain, unspecified: Secondary | ICD-10-CM | POA: Diagnosis not present

## 2023-01-11 DIAGNOSIS — R1319 Other dysphagia: Secondary | ICD-10-CM | POA: Diagnosis not present

## 2023-01-11 DIAGNOSIS — R1013 Epigastric pain: Secondary | ICD-10-CM | POA: Diagnosis not present

## 2023-01-11 DIAGNOSIS — R1110 Vomiting, unspecified: Secondary | ICD-10-CM | POA: Diagnosis not present

## 2023-01-12 DIAGNOSIS — E785 Hyperlipidemia, unspecified: Secondary | ICD-10-CM | POA: Diagnosis not present

## 2023-01-12 DIAGNOSIS — M810 Age-related osteoporosis without current pathological fracture: Secondary | ICD-10-CM | POA: Diagnosis not present

## 2023-01-12 DIAGNOSIS — F329 Major depressive disorder, single episode, unspecified: Secondary | ICD-10-CM | POA: Diagnosis not present

## 2023-01-12 DIAGNOSIS — Z1212 Encounter for screening for malignant neoplasm of rectum: Secondary | ICD-10-CM | POA: Diagnosis not present

## 2023-01-12 DIAGNOSIS — Z79899 Other long term (current) drug therapy: Secondary | ICD-10-CM | POA: Diagnosis not present

## 2023-01-12 DIAGNOSIS — F0390 Unspecified dementia without behavioral disturbance: Secondary | ICD-10-CM | POA: Diagnosis not present

## 2023-01-12 DIAGNOSIS — E039 Hypothyroidism, unspecified: Secondary | ICD-10-CM | POA: Diagnosis not present

## 2023-01-13 ENCOUNTER — Encounter: Payer: Self-pay | Admitting: Psychiatry

## 2023-01-13 DIAGNOSIS — K219 Gastro-esophageal reflux disease without esophagitis: Secondary | ICD-10-CM | POA: Diagnosis not present

## 2023-01-15 DIAGNOSIS — M533 Sacrococcygeal disorders, not elsewhere classified: Secondary | ICD-10-CM | POA: Diagnosis not present

## 2023-01-16 NOTE — Progress Notes (Unsigned)
Cardiology Office Note:  .   Date:  01/19/2023  ID:  [name], DOB 1950/08/08, MRN [id number] PCP: Chilton Greathouse, MD  [facility] Providers Cardiologist:  Kristeen Miss, MD    Patient Profile: .      PMH Palpitations/PVCs Coronary CTA 01/13/2019 Calcium score of 0  No significant CAD Hyperlipidemia Chest pain  Referred to cardiology for chest pain and palpitations and seen by Dr. Elease Hashimoto 11/2014.  She reported chest pain for few months associated with palpitations and also feeling like she cannot get a good breath.  These occur sporadically and are not associated with exercise.  Episodes last several minutes.  Recent TTE had revealed normal LVEF 55 to 60%, mildly reduced diastolic function, and mild TR.  Cardiac monitor completed 12/17/2014 revealed sinus rhythm with occasional PVCs.  She was started on propranolol 20 mg which did help some.  She was later switched to metoprolol.  She continued to have chest pain and underwent Lexiscan Myoview 12/07/2018 which showed no ischemia and EF 78%.  She had hallucinations with metoprolol and was switched to atenolol.  Coronary CTA 01/2019 revealed no evidence of CAD and coronary calcium score of 0.  Last cardiology clinic visit was 04/01/2021 with Dr. Elease Hashimoto.  She was feeling better on atenolol for palpitations.  She was felt to be stable from cardiology perspective and planned to have PCP assume responsibility of atenolol prescription.       History of Present Illness: .   [name] is a very pleasant 72 y.o. female who is here today for chest pain and leg swelling. Her palpitations have been well managed with atenolol. She has developed leg swelling, previously only noted in the ankles that has now progressed to involve the entire lower extremity from the knees down. Swelling is associated with chest discomfort, described as pressure, which is random in occurrence and not related to activity. No associated shortness of breath,  nausea, or lightheadedness. Symptoms started approximately two months ago. She has been managing the leg swelling by elevating the legs during sleep and wearing compression socks, which seem to alleviate the swelling but they are not comfortable. Has history of reflux but this chest discomfort is different. Lives a sedentary lifestyle, with daily activities limited to household chores and walking a new puppy. She has not noticed any chest pain during these activities. Occasional dizziness upon standing but generally hydrates well. Admits to more consumption of processed/convenience foods and the need to limit salt intake. Family history is significant for her father who had CABG age 71s, lived to be 74 y/o.   Discussed the use of AI scribe software for clinical note transcription with the patient, who gave verbal consent to proceed.   ROS: See HPI       Studies Reviewed: Marland Kitchen   EKG Interpretation Date/Time:  Tuesday January 19 2023 09:06:17 EST Ventricular Rate:  69 PR Interval:  152 QRS Duration:  96 QT Interval:  386 QTC Calculation: 413 R Axis:   -31  Text Interpretation: Normal sinus rhythm Left axis deviation Cannot rule out Anterior infarct , age undetermined Nonspecific IVCD Confirmed by Eligha Bridegroom [phone]) on 01/19/2023 10:25:17 AM     Risk Assessment/Calculations:             Physical Exam:   VS:  BP 132/78   Pulse 76   Ht 5\' 5"  (1.651 m)   Wt 159 lb 12.8 oz (72.5 kg)   LMP 03/02/1988 (Approximate)   SpO2  95%   BMI 26.59 kg/m    Wt Readings from Last 3 Encounters:  01/19/23 159 lb 12.8 oz (72.5 kg)  12/25/22 160 lb (72.6 kg)  10/20/22 164 lb (74.4 kg)    GEN: Well nourished, well developed in no acute distress NECK: No JVD; No carotid bruits CARDIAC: RRR, no murmurs, rubs, gallops RESPIRATORY:  Clear to auscultation without rales, wheezing or rhonchi  ABDOMEN: Soft, non-tender, non-distended EXTREMITIES:  Mild bilateral non-pitting LE edema; No deformity      ASSESSMENT AND PLAN: .    Chest pain: New onset leg swelling over the past two months associated with chest discomfort. Chest pain does not worsen with exertion and is not associated with additional symptoms of n/v, diaphoresis, or shortness of breath. Chest discomfort is different from previous palpitations and with GERD. She denies orthopnea, PND, presyncope, syncope. Weight has been stable. Appears euvolemic on exam. EKG reveals sinus rhythm with nonspecific interventricular conduction delay we will get echocardiogram to assess heart and valve function. Consider stress test if echocardiogram is normal and chest discomfort continues. Follow up in 2-3 months.  Leg swelling: New onset leg swelling over the past two months that has progressed from being evident only in ankles to now complete lower leg. Swelling also associated with chest discomfort. It does improve with elevation and compression socks. No significant swelling today. She admits to more dietary indiscretion with sodium recently. Encouraged low sodium diet.  She would like to try Lasix 20 mg as needed.  I have asked her to notify us if she takes this medication on a regular basis so that we can check renal function and potassium.  Labs from 01/12/2023 reveal SCr 1.16, K 4.8. continue leg elevation and compression.  We are getting echocardiogram as noted above.   Palpitations: Quiescent at this time. Continue atenolol.   Hypertension: BP is well controlled. No medication changes today.   Incomplete RBBB: EKG reviewed with Dr. Elease Hashimoto, primary cardiologist due to change in morphology of QRS complex, felt by him to represent incomplete RBBB or nonspecific IVCD. Information provided to patient on this finding.         Dispo: 3 months with me  Signed, Eligha Bridegroom, NP-C

## 2023-01-18 ENCOUNTER — Ambulatory Visit: Payer: Medicare Other

## 2023-01-18 DIAGNOSIS — R293 Abnormal posture: Secondary | ICD-10-CM | POA: Diagnosis not present

## 2023-01-18 DIAGNOSIS — M5459 Other low back pain: Secondary | ICD-10-CM | POA: Diagnosis not present

## 2023-01-18 DIAGNOSIS — R279 Unspecified lack of coordination: Secondary | ICD-10-CM | POA: Diagnosis not present

## 2023-01-18 DIAGNOSIS — M6281 Muscle weakness (generalized): Secondary | ICD-10-CM | POA: Diagnosis not present

## 2023-01-18 DIAGNOSIS — R159 Full incontinence of feces: Secondary | ICD-10-CM | POA: Diagnosis not present
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
## 2023-01-19 ENCOUNTER — Encounter: Payer: Self-pay | Admitting: Nurse Practitioner

## 2023-01-19 ENCOUNTER — Ambulatory Visit: Payer: Medicare Other | Attending: Nurse Practitioner | Admitting: Nurse Practitioner

## 2023-01-19 VITALS — BP 132/78 | HR 76 | Ht 65.0 in | Wt 159.8 lb

## 2023-01-19 DIAGNOSIS — R002 Palpitations: Secondary | ICD-10-CM | POA: Diagnosis not present

## 2023-01-19 DIAGNOSIS — R82998 Other abnormal findings in urine: Secondary | ICD-10-CM | POA: Diagnosis not present

## 2023-01-19 DIAGNOSIS — R079 Chest pain, unspecified: Secondary | ICD-10-CM | POA: Diagnosis not present

## 2023-01-19 DIAGNOSIS — I4510 Unspecified right bundle-branch block: Secondary | ICD-10-CM | POA: Insufficient documentation

## 2023-01-19 DIAGNOSIS — M7989 Other specified soft tissue disorders: Secondary | ICD-10-CM | POA: Diagnosis not present

## 2023-01-19 MED ORDER — FUROSEMIDE 20 MG PO TABS: ORAL_TABLET | ORAL | 3 refills | Status: DC

## 2023-01-19 NOTE — Patient Instructions (Addendum)
Medication Instructions:   Take one (1) tablet by mouth ( 20 mg) daily as needed for leg swelling. If you take this medication everyday please call office or send mychart message Marcelino Duster will need to get labs to check kidney function.    *If you need a refill on your cardiac medications before your next appointment, please call your pharmacy*   Lab Work:  None ordered.  If you have labs (blood work) drawn today and your tests are completely normal, you will receive your results only by: MyChart Message (if you have MyChart) OR A paper copy in the mail If you have any lab test that is abnormal or we need to change your treatment, we will call you to review the results.   Testing/Procedures:  Your physician has requested that you have an echocardiogram. Echocardiography is a painless test that uses sound waves to create images of your heart. It provides your doctor with information about the size and shape of your heart and how well your heart's chambers and valves are working. This procedure takes approximately one hour. There are no restrictions for this procedure. Please do NOT wear cologne, perfume or lotions (deodorant is allowed). Please arrive 15 minutes prior to your appointment time.  Please note: We ask at that you not bring children with you during ultrasound (echo/ vascular) testing. Due to room size and safety concerns, children are not allowed in the ultrasound rooms during exams. Our front office staff cannot provide observation of children in our lobby area while testing is being conducted. An adult accompanying a patient to their appointment will only be allowed in the ultrasound room at the discretion of the ultrasound technician under special circumstances. We apologize for any inconvenience.    Follow-Up: At [facility], you and your health needs are our priority.  As part of our continuing mission to provide you with exceptional heart care, we have created  designated Provider Care Teams.  These Care Teams include your primary Cardiologist (physician) and Advanced Practice Providers (APPs -  Physician Assistants and Nurse Practitioners) who all work together to provide you with the care you need, when you need it.  We recommend signing up for the patient portal called "MyChart".  Sign up information is provided on this After Visit Summary.  MyChart is used to connect with patients for Virtual Visits (Telemedicine).  Patients are able to view lab/test results, encounter notes, upcoming appointments, etc.  Non-urgent messages can be sent to your provider as well.   To learn more about what you can do with MyChart, go to [email or web address].    Your next appointment:   3 month(s)  Provider:   Eligha Bridegroom, NP         Other Instructions  Right Bundle Branch Block  Right bundle branch block (RBBB) is a problem with the way that electrical impulses pass through the heart (electrical conduction abnormality). The heart needs an electrical signal to beat like it should. The electrical signal for a heartbeat starts in the upper chambers of the heart (atria) and then goes to the two lower chambers (left and rightventricles). An RBBB is a block of the pathway that carries the signal to the right ventricle. If you have RBBB, the right side of your heart beats a little slower than the left side. RBBB may be a warning sign of heart disease or a lung problem. What are the causes? RBBB may be caused by: A heart attack. This is also called a  myocardial infarction. Congenital heart disease. This is a heart problem that you are born with. A pulmonary embolism. This is a blood clot that has moved into your lung. Myocarditis. This is an infection of the heart muscle. High blood pressure. In some cases, the cause may not be known. What increases the risk? You are more likely to get RBBB if: You are female. You are 61 years of age or older. You have heart  disease. You have had a heart attack or heart surgery. You have a bigger heart than you should (enlarged heart). There is a hole in the walls between the chambers of your heart (septal defect). What are the signs or symptoms? In most cases, RBBB does not cause symptoms. How is this diagnosed? RBBB may be diagnosed based on an electrocardiogram (ECG). It is often found when an ECG is done as part of a physical exam. You may also have imaging tests done. These may include: Chest X-rays. Echocardiogram. How is this treated? You may not need treatment if you do not have symptoms or any other heart problems. But you may need to see your health care provider more often to make sure you do not get other heart or lung problems. You may also need treatment if a condition is causing the RBBB. Follow these instructions at home: Lifestyle  Ask your provider about what you may eat and drink. Eat foods that are good for your heart and stay at a healthy weight. Work with a diet and Hospital doctor (dietitian) to make the best eating plan for you. Activity Get regular exercise as told by your provider. Return to your normal activities as told by your provider. Ask your provider what activities are safe for you. General instructions Take over-the-counter and prescription medicines only as told by your provider. Do not use any products that contain nicotine or tobacco. These products include cigarettes, chewing tobacco, and vaping devices, such as e-cigarettes. If you need help quitting, ask your provider. Contact a health care provider if: You are light-headed. You faint. Get help right away if: You have chest pain. You have trouble breathing. These symptoms may be an emergency. Get help right away. Call 911. Do not wait to see if the symptoms will go away. Do not drive yourself to the hospital. This information is not intended to replace advice given to you by your health care provider. Make sure  you discuss any questions you have with your health care provider. Document Revised: 11/18/2021 Document Reviewed: 11/18/2021 Elsevier Patient Education  2024 Elsevier Inc.  Tackling Obesity with Lifestyle Changes  Obesity- What is it? And What can we do about it?  Obesity is a chronic complex disease defined as excessive fat deposits that can have a negative effect on our health. It can lead to many other diseases including type 2 diabetes.  Weight gain occurs when the amount of energy (calories) we consume is greater than the amount we use.  When our energy output is greater than our energy input we lose weight. The basic concept is simple, but in reality, it's much more complicated.  Unfortunately, in some people, our bodies have many ways it can compensate when we try to eat less and move more which can prevent [place] from changing our weight. This can lead to some people having a much more difficult time losing weight even when they put healthy habits into practice. This can be frustrating. We want to focus on healthy habits, physical activity and how  we feel, and less the number on the scale.  Food As Energy  Calories  Calories is just a unit of measurement for energy.  Counting calories is not required to lose weight but counting for a short period of time can:   help you learn good portion sizes   Learn what your true energy needs are.   Help you be more aware of your snacking or grazing habits  To help calculate how many calories you should be eating, the NIH has a great body weight planner calculator at BeverageBuggy.si  Types of Energy Expenditure  Basal Metabolic Rate (BMR) Energy that our bodies use to preform everyday tasks. More muscle mass through resistance training can increase this a small amount  Thermic Effect of Food The amount of energy that it takes to breakdown the food we eat. This will be highest when we eat protein and fiber rich foods  Exercise  Energy Expenditure The amount of energy used during formal exercise (walking, biking, weightlifting)  Non-exercise activity thermogenesis (NEAT) The amount of energy spent on activities that are not formal exercise (standing, fidgeting). Therefore, it is not only important to do formal exercise but also move around throughout the day.  Managing The Meal  Macro nutrients (carbohydrates, fats and protein, fiber, water)  Micronutrients (vitamins, minerals)  Dietary Fiber  Benefits Examples Cautions  Soluble fiber  Decreases cholesterol  improve blood sugar control,  Feeds our gut bacteria  Allows [place] to feel fuller for longer so we eat less  fruits  oats  barley  legumes  peas  Beans  vegetables (broccoli) and root vegetables (carrots) Add fiber into your diet slowly and be sure to drink at least 8 cups of water a day. This will help limit gas, bloating, diarrhea, or constipation.  Insoluble Fiber  Improves digestive health by making stool easier to pass  Allows [place] to feel fuller for longer so we eat less  whole grains  nuts  seeds  skin of fruit  vegetables (green beans, zucchini, cauliflower)  Tricks to add more fiber to your diet   Add beans (pinto, kidney, lima, navy and garbanzo) to salads, ground meat or brown rice   Add nuts or seeds and or fresh/frozen fruit to yogurt, cottage cheese, salads or steel cut oats   Cut up vegetables and eat with hummus   Look for unsweetened whole grain cereals with at least 5g of fiber per serving   Switch to whole grain bread. Look for bread that has whole grain flour as the first ingredient and has more fiber than carbs if you were to multiple the fiber x 10.   Try bulgar, barely, quinoa, buckwheat, brown rice wild rice instead of white rice   Keep frozen vegetables on hand to add to dishes or soups  Meal Planning:  Meal planning is the key to setting you up for success. Here are some examples of healthy meal  options.  Breakfast  Option 1: Omelette with vegetables (1 egg, spinach, mushrooms, or other vegetable of your choice), 2 slices whole-grain toast, tip of thumb size butter or soft margarine,  cup low-fat milk or yogurt  Option 2: steel-cut rolled oats (? cup dry), 1 tbsp peanut butter added to cooked oats,  cup low-fat milk.  Option 3: 2 slices whole-grain or rye toast with avocado spread ( small avocado mased with herbs and pepper to taste), 1 poached egg or [place] up (cooked to your liking)  Option 4:  cup plain  0% [place] yogurt topped with  cup berries and  cup walnuts or almonds, 2 slices whole-grain or rye toast, tip of thumb size soft margarine/butter  Lunch:  Option 1: 2 cups red lentil soup, green salad with 1 tbsp homemade vinaigrette (extra virgin olive oil and vinegar of choice plus spices)  Option 2: 3 oz. roasted chicken, 2 slices whole-grain bread, 2 tsp mayonnaise, mustard, lettuce, tomato if desired, 1 fruit (example: medium-sized apple or small pear)  Option 3: 3 oz. tuna packed in water, 1 whole-wheat pita (6 inch), 2 tsp mayonnaise, lettuce, tomato, or other non-starchy vegetable of your choice, 1 fruit (example: medium-sized apple or small pear)  Option 4: 1 serving of garden veggie buddha bowl with lentils and tahini sauce and 1 cup berries topped with  cup plain 0% Greek yogurt  Dinner:  Option 1: 1 serving roasted cauliflower salad, 3-4 oz. grilled or baked pork loin chop, 1/2 cup mashed potato, or brown rice or quinoa  Option 2: 1 serving fish (baked, grilled or air fried), green salad, 1 tbsp homemade vinaigrette,  cup cooked couscous  Option 3: 1 cup cooked whole grained pasta (example: spaghetti, spirals, macaroni),  cup favorite pasta sauce (preferably homemade), 3-4 oz. grilled or baked chicken, green salad, 1 tbsp homemade vinaigrette  Option 4: 1 serving oven roasted salmon,  cup mashed sweet potato or couscous or brown rice or quinoa,  broccoli (steamed or roasted)  Healthy snacks:   Carrots or celery with 1 tbsp of hummus   1 medium-sized fruit (apple or orange)   1 cup plain 0% [place] yogurt with  cup berries   Half apple, sliced, with 1 tbsp (15 mL) peanut or almond butter  Dining out:  Eating away from home has become a part of many people's lifestyle. Making healthy choices when you are eating out is important too. Portion size is an important part of healthy choices. Most branded fast-food places provide calories, sodium, and fat content for their menu items. www.calorieking.com would be great resource to find nutrition facts for your favorite brands and fast-food restaurants. Company specific website can be Chief Technology Officer for nutrition information for their items. (e.g. www.mcdonalds.com or www.nutritionix.com/[place]/menu/premium)  Here are some tips to help you make wise food choices when you are dining out.  Chose more often Avoid  Beverages  Water, low fat milk  Sugar-free/diet drinks  Unsweet tea or coffee  Milkshakes, fruit drinks, regular pop  Alcohol, specialty drinks (e.g. iced cappuccino)  Fast food  Garden salad  Mini subs, pita sandwiches ect with extra vegetables  plain burgers, grilled chicken  Vegetarian or cheese pizza with whole-grain crust  Burgers/sandwiches with bacon, cheese, and high-fat sauces  [place] fries, fried chicken, fried fish, poutine, hash browns  Pizza with processed meats  Starters  Raw vegetables, salads (garden, spinach, fruit)  clear or vegetable soups  Seafood cocktail  Whole-grain breads and rolls  Salads with high-fat dressings or toppings  Creamy soups  Wings, egg rolls  onion rings, nachos  White or garlic bread  Main courses Grains & Starches (amount equal to  of your plate)  Oatmeal, high-fiber/lower-sugar cereals  Whole-grain breads, rice, pasta, barley, couscous  Sweet potatoes  Sugary, low-fiber cereals  Large bagels, muffins,  croissants, white bread  [place] fries, hash browns, fried rice Meat and alternative (amount equal to  of your plate)  Lean meats, poultry, fish, eggs, low-fat cheese  Tofu, vegetable protein Legumes (e.g. lentils, chickpeas, beans)  High-salt and/or high-fat meats (  e.g. ribs, wings, sausages, wieners, processed lunch meats, imposter meats) Vegetables (amount equal to  of your plate)  Salads ([place], garden, spinach), plain vegetables  Salads with creamy, high-fat dressings and   Vegetables on sandwiches ect toppings like bacon bits, croutons, cheese  Desserts  Fresh fruit, frozen yogourt, skim milk latte  Cakes, pies, pastries, ice cream, cheesecake   DASH Eating Plan DASH stands for Dietary Approaches to Stop Hypertension. The DASH eating plan is a healthy eating plan that has been shown to: Lower high blood pressure (hypertension). Reduce your risk for type 2 diabetes, heart disease, and stroke. Help with weight loss. What are tips for following this plan? Reading food labels Check food labels for the amount of salt (sodium) per serving. Choose foods with less than 5 percent of the Daily Value (DV) of sodium. In general, foods with less than 300 milligrams (mg) of sodium per serving fit into this eating plan. To find whole grains, look for the word "whole" as the first word in the ingredient list. Shopping Buy products labeled as "low-sodium" or "no salt added." Buy fresh foods. Avoid canned foods and pre-made or frozen meals. Cooking Try not to add salt when you cook. Use salt-free seasonings or herbs instead of table salt or sea salt. Check with your health care provider or pharmacist before using salt substitutes. Do not fry foods. Cook foods in healthy ways, such as baking, boiling, grilling, roasting, or broiling. Cook using oils that are good for your heart. These include olive, canola, avocado, soybean, and sunflower oil. Meal planning  Eat a balanced diet. This should  include: 4 or more servings of fruits and 4 or more servings of vegetables each day. Try to fill half of your plate with fruits and vegetables. 6-8 servings of whole grains each day. 6 or less servings of lean meat, poultry, or fish each day. 1 oz is 1 serving. A 3 oz (85 g) serving of meat is about the same size as the palm of your hand. One egg is 1 oz (28 g). 2-3 servings of low-fat dairy each day. One serving is 1 cup (237 mL). 1 serving of nuts, seeds, or beans 5 times each week. 2-3 servings of heart-healthy fats. Healthy fats called omega-3 fatty acids are found in foods such as walnuts, flaxseeds, fortified milks, and eggs. These fats are also found in cold-water fish, such as sardines, salmon, and mackerel. Limit how much you eat of: Canned or prepackaged foods. Food that is high in trans fat, such as fried foods. Food that is high in saturated fat, such as fatty meat. Desserts and other sweets, sugary drinks, and other foods with added sugar. Full-fat dairy products. Do not salt foods before eating. Do not eat more than 4 egg yolks a week. Try to eat at least 2 vegetarian meals a week. Eat more home-cooked food and less restaurant, buffet, and fast food. Lifestyle When eating at a restaurant, ask if your food can be made with less salt or no salt. If you drink alcohol: Limit how much you have to: 0-1 drink a day if you are female. 0-2 drinks a day if you are female. Know how much alcohol is in your drink. In the [place] one drink is one 12 oz bottle of beer (355 mL), one 5 oz glass of wine (148 mL), or one 1 oz glass of hard liquor (44 mL). General information Avoid eating more than 2,300 mg of salt a day. If you have  hypertension, you may need to reduce your sodium intake to 1,500 mg a day. Work with your provider to stay at a healthy body weight or lose weight. Ask what the best weight range is for you. On most days of the week, get at least 30 minutes of exercise that causes your  heart to beat faster. This may include walking, swimming, or biking. Work with your provider or dietitian to adjust your eating plan to meet your specific calorie needs. What foods should I eat? Fruits All fresh, dried, or frozen fruit. Canned fruits that are in their natural juice and do not have sugar added to them. Vegetables Fresh or frozen vegetables that are raw, steamed, roasted, or grilled. Low-sodium or reduced-sodium tomato and vegetable juice. Low-sodium or reduced-sodium tomato sauce and tomato paste. Low-sodium or reduced-sodium canned vegetables. Grains Whole-grain or whole-wheat bread. Whole-grain or whole-wheat pasta. Brown rice. Orpah Cobb. Bulgur. Whole-grain and low-sodium cereals. Pita bread. Low-fat, low-sodium crackers. Whole-wheat flour tortillas. Meats and other proteins Skinless chicken or [place]. Ground chicken or [place]. Pork with fat trimmed off. Fish and seafood. Egg whites. Dried beans, peas, or lentils. Unsalted nuts, nut butters, and seeds. Unsalted canned beans. Lean cuts of beef with fat trimmed off. Low-sodium, lean precooked or cured meat, such as sausages or meat loaves. Dairy Low-fat (1%) or fat-free (skim) milk. Reduced-fat, low-fat, or fat-free cheeses. Nonfat, low-sodium ricotta or cottage cheese. Low-fat or nonfat yogurt. Low-fat, low-sodium cheese. Fats and oils Soft margarine without trans fats. Vegetable oil. Reduced-fat, low-fat, or light mayonnaise and salad dressings (reduced-sodium). Canola, safflower, olive, avocado, soybean, and sunflower oils. Avocado. Seasonings and condiments Herbs. Spices. Seasoning mixes without salt. Other foods Unsalted popcorn and pretzels. Fat-free sweets. The items listed above may not be all the foods and drinks you can have. Talk to a dietitian to learn more. What foods should I avoid? Fruits Canned fruit in a light or heavy syrup. Fried fruit. Fruit in cream or butter sauce. Vegetables Creamed or fried  vegetables. Vegetables in a cheese sauce. Regular canned vegetables that are not marked as low-sodium or reduced-sodium. Regular canned tomato sauce and paste that are not marked as low-sodium or reduced-sodium. Regular tomato and vegetable juices that are not marked as low-sodium or reduced-sodium. Rosita Fire. Olives. Grains Baked goods made with fat, such as croissants, muffins, or some breads. Dry pasta or rice meal packs. Meats and other proteins Fatty cuts of meat. Ribs. Fried meat. Tomasa Blase. Bologna, salami, and other precooked or cured meats, such as sausages or meat loaves, that are not lean and low in sodium. Fat from the back of a pig (fatback). Bratwurst. Salted nuts and seeds. Canned beans with added salt. Canned or smoked fish. Whole eggs or egg yolks. Chicken or [place] with skin. Dairy Whole or 2% milk, cream, and half-and-half. Whole or full-fat cream cheese. Whole-fat or sweetened yogurt. Full-fat cheese. Nondairy creamers. Whipped toppings. Processed cheese and cheese spreads. Fats and oils Butter. Stick margarine. Lard. Shortening. Ghee. Bacon fat. Tropical oils, such as coconut, palm kernel, or palm oil. Seasonings and condiments Onion salt, garlic salt, seasoned salt, table salt, and sea salt. Worcestershire sauce. Tartar sauce. Barbecue sauce. Teriyaki sauce. Soy sauce, including reduced-sodium soy sauce. Steak sauce. Canned and packaged gravies. Fish sauce. Oyster sauce. Cocktail sauce. Store-bought horseradish. Ketchup. Mustard. Meat flavorings and tenderizers. Bouillon cubes. Hot sauces. Pre-made or packaged marinades. Pre-made or packaged taco seasonings. Relishes. Regular salad dressings. Other foods Salted popcorn and pretzels. The items listed above may not  be all the foods and drinks you should avoid. Talk to a dietitian to learn more. Where to find more information National Heart, Lung, and Blood Institute (NHLBI): BuffaloDryCleaner.gl American Heart Association (AHA):  heart.org Academy of Nutrition and Dietetics: eatright.org National Kidney Foundation (NKF): kidney.org This information is not intended to replace advice given to you by your health care provider. Make sure you discuss any questions you have with your health care provider. Document Revised: 03/05/2022 Document Reviewed: 03/05/2022 Elsevier Patient Education  2024 [employer].

## 2023-01-20 DIAGNOSIS — H53143 Visual discomfort, bilateral: Secondary | ICD-10-CM | POA: Diagnosis not present

## 2023-01-20 DIAGNOSIS — H04123 Dry eye syndrome of bilateral lacrimal glands: Secondary | ICD-10-CM | POA: Diagnosis not present

## 2023-01-21 DIAGNOSIS — R0789 Other chest pain: Secondary | ICD-10-CM | POA: Diagnosis not present

## 2023-01-21 DIAGNOSIS — K219 Gastro-esophageal reflux disease without esophagitis: Secondary | ICD-10-CM | POA: Diagnosis not present

## 2023-01-21 DIAGNOSIS — K449 Diaphragmatic hernia without obstruction or gangrene: Secondary | ICD-10-CM | POA: Diagnosis not present

## 2023-02-01 ENCOUNTER — Encounter: Payer: Self-pay | Admitting: Psychiatry

## 2023-02-01 ENCOUNTER — Ambulatory Visit (INDEPENDENT_AMBULATORY_CARE_PROVIDER_SITE_OTHER): Payer: Medicare Other | Admitting: Psychiatry

## 2023-02-01 DIAGNOSIS — F330 Major depressive disorder, recurrent, mild: Secondary | ICD-10-CM

## 2023-02-01 DIAGNOSIS — F411 Generalized anxiety disorder: Secondary | ICD-10-CM

## 2023-02-01 DIAGNOSIS — F5101 Primary insomnia: Secondary | ICD-10-CM | POA: Diagnosis not present

## 2023-02-01 MED ORDER — LORAZEPAM 0.5 MG PO TABS: ORAL_TABLET | ORAL | 1 refills | Status: DC
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
## 2023-02-02 ENCOUNTER — Ambulatory Visit: Payer: Medicare Other | Attending: Obstetrics and Gynecology

## 2023-02-02 DIAGNOSIS — R293 Abnormal posture: Secondary | ICD-10-CM | POA: Insufficient documentation

## 2023-02-02 DIAGNOSIS — M5459 Other low back pain: Secondary | ICD-10-CM | POA: Diagnosis not present

## 2023-02-02 DIAGNOSIS — M6281 Muscle weakness (generalized): Secondary | ICD-10-CM | POA: Insufficient documentation

## 2023-02-02 DIAGNOSIS — R279 Unspecified lack of coordination: Secondary | ICD-10-CM | POA: Insufficient documentation
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
## 2023-02-09 ENCOUNTER — Telehealth: Payer: Self-pay | Admitting: Cardiovascular Disease

## 2023-02-09 ENCOUNTER — Other Ambulatory Visit (HOSPITAL_BASED_OUTPATIENT_CLINIC_OR_DEPARTMENT_OTHER): Payer: Medicare Other

## 2023-02-09 NOTE — Telephone Encounter (Signed)
   [facility] Pre-operative Risk Assessment    Request for surgical clearance:  What type of surgery is being performed?  2 fillings + 2 crowns on upper front   When is this surgery scheduled?  02/11/23   What type of clearance is required (medical clearance vs. Pharmacy clearance to hold med vs. Both)?  Both   Are there any medications that need to be held prior to surgery and how long? Please provide advisement on holding medications + pre-med   Practice name and name of physician performing surgery?  [facility]  Dr. Aundria Rud    What is your office phone number? [phone]   7.   What is your office fax number? [phone]  8.   Anesthesia type (None, local, MAC, general)?  Local    [name] 02/09/2023, 8:51 AM

## 2023-02-09 NOTE — Telephone Encounter (Signed)
Patient seen by me on 01/19/2023 at which time she reported chest discomfort and leg swelling. She is scheduled to undergo echocardiogram on 02/12/23. Given that the request is for fillings and crowns under local anesthesia, which is a low risk procedure, she may proceed without further cardiac testing.    SBE prophylaxis is not required for the patient.  I will route this recommendation to the requesting party via Epic fax function and remove from pre-op pool.  Please call with questions.  Levi Aland, NP-C  02/09/2023, 9:06 AM [address] Office [phone] Fax [phone]

## 2023-02-10 ENCOUNTER — Ambulatory Visit: Payer: Medicare Other | Admitting: Psychiatry

## 2023-02-10 ENCOUNTER — Ambulatory Visit: Payer: Medicare Other

## 2023-02-10 DIAGNOSIS — F411 Generalized anxiety disorder: Secondary | ICD-10-CM

## 2023-02-10 NOTE — Progress Notes (Signed)
[facility] Counselor/Therapist Progress Note  Patient ID: [name], MRN: [id number],    Date: 02/10/2023  Time Spent: 55 minutes   Treatment Type: Individual Therapy  Reported Symptoms: anxiety, depression, still some lingering sadness from loss of dog in 12/2022   Mental Status Exam:  Appearance:   Casual and Neat     Behavior:  Appropriate, Sharing, and Motivated  Motor:  Normal  Speech/Language:   Clear and Coherent  Affect:  Anxious, some depression  Mood:  anxious and depressed  Thought process:  goal directed  Thought content:    WNL  Sensory/Perceptual disturbances:    WNL  Orientation:  oriented to person, place, time/date, situation, day of week, month of year, year, and stated date of Dec. 11, 2024  Attention:  Good  Concentration:  Good  Memory:  WNL   Fund of knowledge:   Good  Insight:    Good  Judgment:   Good  Impulse Control:  Good   Risk Assessment: Danger to Self:  No Self-injurious Behavior: No Danger to Others: No Duty to Warn:no Physical Aggression / Violence:No  Access to Firearms a concern: No  Gang Involvement:No   Subjective:  Patient in session today and reports anxiety, depression, and some lingering grief from death of prior dog in 2023/01/22. Struggling in her relationship with adult daughter who has young daughter and concerns about other grandchild, and patient having very mixed feelings of anger, sadness, anxiety, depression more recently. "Not dwelling on it as much now." Liking their new townhome. Did get another pet dog and already bonding. Feeling better about some "memory" issues she felt were happening for her as she had "appt with person in neurology office and was told they didn't think I was having true memory issues but rather felt a couple of my meds are impacting my memory at times". Stopped her Lamictal and that has helped her memory, so is feeling more confident.  Anxious about their home not selling yet and trying to work  on patients especially since this is holiday time and she feels like that might be slowing it down some.  Getting adjusted in their new townhome and seem to like it.  Family issues processed more today and patient trying to have healthier boundaries.  Still some grief issues along with anxiety regarding concerns with her older adult daughter.  (Not all details included in this note due to patient privacy needs).  Did discuss some additional communication skills for patient in speaking with her older daughter and she felt this might be helpful.  Remains very active in her church which she feels is helpful for her and her husband at this point in their life especially.  To continue working with her goals in between sessions especially the ones revolving around resolving conflicts effectively, and managing depression and anxiety in healthier ways.  Denies any SI.    Interventions: Cognitive Behavioral Therapy and Ego-Supportive  Long term goal: Develop healthy cognitive patterns and beliefs about self and the world that lead to alleviation of depression and anxiety, and help prevent relapse of depression and anxiety. Short term goal: Learn and implement personal skills for managing stress, solving daily problems, and resolving conflicts effectively.  Strategies: Use modeling and role-playing to help recognize anxious/depressive/negative thought patterns that create anxious/depressive/negative feelings and actions, interrupt them and replace with more positive reality-based thoughts that do not support depression.  Diagnosis:   ICD-10-CM   1. Generalized anxiety disorder  F41.1  Plan:  Patient today continues work on her anxiety, some sadness regarding multiple family issues, and also working to strengthen the relationship with her husband who is having some issues as he ages emotionally and physically.  Very open in session as she is dealing with some sensitive issues.  She is showing progress and  movement to her treatment goals.  Reminded and encouraged patient in her practice of more positive and self affirming behaviors as noted in sessions including: Interrupting anxious/depressive thoughts and challenge them to replace with more realistic thoughts, leading in herself more that she can make and keep significant positive changes to feel better about herself and her future, healthy boundaries with family and others, contact with supportive people, stay in the present focusing on what she can change her control, remain connected within her church family which is very supportive of her and her husband, allow her faith to be a healing resource for her emotionally as well as spiritually, use of positive self talk, and realize the strength she shows when working with goal-directed behaviors to move in a direction that supports her improved emotional health and her outlook into the future.  Patient is showing progress and needs to continue working with goal-directed behaviors to keep moving in a healthier and more hopeful direction.  Goal review and progress/challenges noted with patient.  Next appointment within 3 to 4 weeks.   Mathis Fare, LCSW

## 2023-02-12 ENCOUNTER — Ambulatory Visit (HOSPITAL_BASED_OUTPATIENT_CLINIC_OR_DEPARTMENT_OTHER): Payer: Medicare Other

## 2023-02-12 ENCOUNTER — Encounter: Payer: Self-pay | Admitting: Cardiovascular Disease

## 2023-02-12 DIAGNOSIS — M7989 Other specified soft tissue disorders: Secondary | ICD-10-CM | POA: Diagnosis not present

## 2023-02-12 DIAGNOSIS — I4510 Unspecified right bundle-branch block: Secondary | ICD-10-CM

## 2023-02-12 DIAGNOSIS — R002 Palpitations: Secondary | ICD-10-CM | POA: Diagnosis not present

## 2023-02-12 DIAGNOSIS — L710 Perioral dermatitis: Secondary | ICD-10-CM | POA: Diagnosis not present

## 2023-02-12 DIAGNOSIS — R079 Chest pain, unspecified: Secondary | ICD-10-CM

## 2023-02-12 LAB — ECHOCARDIOGRAM COMPLETE
AR max vel: 1.85 cm2
AV Area VTI: 1.91 cm2
AV Area mean vel: 1.77 cm2
AV Mean grad: 2 mm[Hg]
AV Peak grad: 3.4 mm[Hg]
Ao pk vel: 0.92 m/s
Area-P 1/2: 4.04 cm2
S' Lateral: 2.99 cm

## 2023-02-15 NOTE — Telephone Encounter (Signed)
Ms. Puls, your echo reveals normal heart function and no significant valve dysfunction.  There is only trivial leakiness of the mitral valve and mild calcification of the aortic valve, neither of which are impairing valve function. There is no finding that would explain chest pain or mild leg swelling. Please let us know if you continue to have concerning symptoms. Otherwise, continue to limit sodium to < 2000 mg daily and aim to get 150 minutes of moderate intensity exercise each week.    Called and spoke with patient who had already reviewed the above comments on her ECHO. Just reiterated Michelle's comments and assured that the trivial leaking of the valve was not problematic. No further questions.

## 2023-02-17 DIAGNOSIS — R5383 Other fatigue: Secondary | ICD-10-CM | POA: Diagnosis not present

## 2023-02-17 DIAGNOSIS — E039 Hypothyroidism, unspecified: Secondary | ICD-10-CM | POA: Diagnosis not present

## 2023-02-17 DIAGNOSIS — R6883 Chills (without fever): Secondary | ICD-10-CM | POA: Diagnosis not present

## 2023-02-17 DIAGNOSIS — Z1152 Encounter for screening for COVID-19: Secondary | ICD-10-CM | POA: Diagnosis not present

## 2023-02-17 DIAGNOSIS — B349 Viral infection, unspecified: Secondary | ICD-10-CM | POA: Diagnosis not present

## 2023-02-17 DIAGNOSIS — L710 Perioral dermatitis: Secondary | ICD-10-CM | POA: Diagnosis not present

## 2023-02-17 DIAGNOSIS — R531 Weakness: Secondary | ICD-10-CM | POA: Diagnosis not present

## 2023-02-17 DIAGNOSIS — R52 Pain, unspecified: Secondary | ICD-10-CM | POA: Diagnosis not present

## 2023-02-22 DIAGNOSIS — M47812 Spondylosis without myelopathy or radiculopathy, cervical region: Secondary | ICD-10-CM | POA: Diagnosis not present

## 2023-02-22 DIAGNOSIS — G8929 Other chronic pain: Secondary | ICD-10-CM | POA: Diagnosis not present

## 2023-02-22 DIAGNOSIS — M7918 Myalgia, other site: Secondary | ICD-10-CM | POA: Diagnosis not present

## 2023-03-01 ENCOUNTER — Telehealth (INDEPENDENT_AMBULATORY_CARE_PROVIDER_SITE_OTHER): Payer: Medicare Other | Admitting: Psychiatry

## 2023-03-01 ENCOUNTER — Encounter: Payer: Self-pay | Admitting: Psychiatry

## 2023-03-01 DIAGNOSIS — F5101 Primary insomnia: Secondary | ICD-10-CM

## 2023-03-01 DIAGNOSIS — F411 Generalized anxiety disorder: Secondary | ICD-10-CM | POA: Diagnosis not present

## 2023-03-01 DIAGNOSIS — F3342 Major depressive disorder, recurrent, in full remission: Secondary | ICD-10-CM | POA: Diagnosis not present

## 2023-03-01 MED ORDER — LORAZEPAM 0.5 MG PO TABS: ORAL_TABLET | ORAL | 0 refills | Status: DC
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
## 2023-03-04 ENCOUNTER — Ambulatory Visit: Payer: Medicare Other | Admitting: Psychiatry

## 2023-03-04 DIAGNOSIS — F411 Generalized anxiety disorder: Secondary | ICD-10-CM
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
## 2023-03-12 ENCOUNTER — Telehealth: Payer: Self-pay

## 2023-03-12 DIAGNOSIS — N898 Other specified noninflammatory disorders of vagina: Secondary | ICD-10-CM

## 2023-03-12 MED ORDER — FLUCONAZOLE 150 MG PO TABS: ORAL_TABLET | ORAL | 0 refills | Status: DC

## 2023-03-12 NOTE — Telephone Encounter (Signed)
 Ok to send fluconazole 150mg  po #2 doses 3 days apart, thanks

## 2023-03-12 NOTE — Telephone Encounter (Signed)
 BS pt LVM in triage line stating that she was recently on abx and is now having yeast infection sxs. Reported "goopy" discharge in VM.  Please advise.  AEX scheduled for 05/2023.

## 2023-03-12 NOTE — Telephone Encounter (Signed)
 Rx sent and pt notified. Encounter closed.

## 2023-03-25 ENCOUNTER — Ambulatory Visit: Payer: Medicare Other | Admitting: Psychiatry

## 2023-03-25 DIAGNOSIS — F411 Generalized anxiety disorder: Secondary | ICD-10-CM

## 2023-03-25 NOTE — Progress Notes (Signed)
[facility] Counselor/Therapist Progress Note  Patient ID: [name], MRN: [id number],    Date: 03/25/2023  Time Spent: 53 minutes   Treatment Type: Individual Therapy  Reported Symptoms: anxiety, depression, grief lessening   Mental Status Exam:  Appearance:   Neat and Well Groomed     Behavior:  Appropriate, Sharing, and Motivated  Motor:  Normal  Speech/Language:   Clear and Coherent  Affect:  Depressed and anxiety  Mood:  anxious and depressed  Thought process:  goal directed  Thought content:    Rumination  Sensory/Perceptual disturbances:    WNL  Orientation:  oriented to person, place, time/date, situation, day of week, month of year, year, and stated date of Jan. 23, 2025  Attention:  Good  Concentration:  Good  Memory:  WNL  Fund of knowledge:   Good  Insight:    Good  Judgment:   Good  Impulse Control:  Good/Fair   Risk Assessment: Danger to Self:  No Self-injurious Behavior: No Danger to Others: No Duty to Warn:no Physical Aggression / Violence:No  Access to Firearms a concern: No  Gang Involvement:No   Subjective:  Patient in session reporting anxiety to be her stronger symptom along with depression, and some grief that is decreasing.  Worrying about her 2 daughters and their mental/emotion health. One daughter very blaming of patient in past and present, very angry often, and this has ended up affecting patient's grand-daughter's relationship and involvement with patient which has saddened patient.  Patient has however had to set some limits and boundaries with oldest daughter and knows that this is the  healthier thing to do.  Continues to be happy after she and husband moved to a townhome and is enjoying making a few decorative changes.  Discussed today and trying to use healthier ways of connecting with daughter and talking with her that might can eventually open-up their relationship again on a healthier basis.  Does listen to daughter but knows  that she cannot "fix her situation".  Also not sure that she is getting true information but regardless does have contact with her as she does not want that daughter to and their relationship.  Has hopes that in the future things can get better but right now it does not seem daughter is open to hearing about those options.  Does feel that her memory continues to be some better without taking Lamictal.  Working to take good care of herself mentally, emotionally, and physically and is showing progress.  Patient to continue working with treatment goals in and between therapy sessions.  Enjoying their new dog and all the joy that it brings, as they continue to experience some decreased intermittent grief after the loss of their previous dog.  Interventions: Cognitive Behavioral Therapy and Ego-Supportive  Long term goal: Develop healthy cognitive patterns and beliefs about self and the world that lead to alleviation of depression and anxiety, and help prevent relapse of depression and anxiety. Short term goal: Learn and implement personal skills for managing stress, solving daily problems, and resolving conflicts effectively.  Strategies: Use modeling and role-playing to help recognize anxious/depressive/negative thought patterns that create anxious/depressive/negative feelings and actions, interrupt them and replace with more positive reality-based thoughts that do not support depression.   Diagnosis:   ICD-10-CM   1. Generalized anxiety disorder  F41.1      Plan: Patient today further working on her anxiety, depression and some issues within the family interpersonally primarily with her older  daughter as noted above.  Husband has shown some physical and emotional issues related to his aging and is being well-followed medically.  Patient acknowledges she is making progress and needs to continue working with goal-directed behaviors to keep moving forward and a more healthy and hopeful direction.   Encouraged patient in her practice of more positive and self affirming behaviors as noted in sessions including: Believing more in herself that she can make and keep significant positive changes to feel better about herself and her future, interrupting anxious/depressive thoughts and challenge them to replace with more realistic thoughts, healthy boundaries with family and others, contact with supportive people, stay in the present focusing what she can change her control, stay connected within her church family which is very supportive of her and her husband, allow her faith to be a healing resource for her emotionally as well as spiritually, use of positive self talk, and recognize the strength she shows when working with goal-directed behaviors to move in a direction that supports improved emotional health and her overall wellbeing.  Goal review and progress/challenges noted with patient.  Next appointment within 3 weeks.   Mathis Fare, LCSW

## 2023-04-06 DIAGNOSIS — H11123 Conjunctival concretions, bilateral: Secondary | ICD-10-CM | POA: Diagnosis not present

## 2023-04-06 DIAGNOSIS — Z09 Encounter for follow-up examination after completed treatment for conditions other than malignant neoplasm: Secondary | ICD-10-CM | POA: Diagnosis not present

## 2023-04-06 DIAGNOSIS — L720 Epidermal cyst: Secondary | ICD-10-CM | POA: Diagnosis not present

## 2023-04-06 DIAGNOSIS — H0279 Other degenerative disorders of eyelid and periocular area: Secondary | ICD-10-CM | POA: Diagnosis not present

## 2023-04-06 DIAGNOSIS — H04123 Dry eye syndrome of bilateral lacrimal glands: Secondary | ICD-10-CM | POA: Diagnosis not present

## 2023-04-08 DIAGNOSIS — K219 Gastro-esophageal reflux disease without esophagitis: Secondary | ICD-10-CM | POA: Diagnosis not present

## 2023-04-08 DIAGNOSIS — K2100 Gastro-esophageal reflux disease with esophagitis, without bleeding: Secondary | ICD-10-CM | POA: Diagnosis not present

## 2023-04-09 ENCOUNTER — Ambulatory Visit (INDEPENDENT_AMBULATORY_CARE_PROVIDER_SITE_OTHER): Payer: Medicare Other | Admitting: Physician Assistant

## 2023-04-09 ENCOUNTER — Encounter: Payer: Self-pay | Admitting: Physician Assistant

## 2023-04-09 DIAGNOSIS — F3342 Major depressive disorder, recurrent, in full remission: Secondary | ICD-10-CM | POA: Diagnosis not present

## 2023-04-09 DIAGNOSIS — F411 Generalized anxiety disorder: Secondary | ICD-10-CM

## 2023-04-09 DIAGNOSIS — F5101 Primary insomnia: Secondary | ICD-10-CM

## 2023-04-09 MED ORDER — BUSPIRONE HCL 10 MG PO TABS: 10.0000 mg | ORAL_TABLET | Freq: Two times a day (BID) | ORAL | 1 refills | Status: DC

## 2023-04-09 MED ORDER — TRAZODONE HCL 50 MG PO TABS: 50.0000 mg | ORAL_TABLET | Freq: Every day | ORAL | 1 refills | Status: DC

## 2023-04-09 MED ORDER — LORAZEPAM 0.5 MG PO TABS: ORAL_TABLET | ORAL | Status: DC

## 2023-04-09 NOTE — Progress Notes (Signed)
 [facility]  Patient ID: [name],  MRN: [id number]  PCP: Avva, Ravisankar, MD  Date of Evaluation: 04/09/2023 Time spent:25 minutes  Chief Complaint:  Chief Complaint   Insomnia; Follow-up    HISTORY/CURRENT STATUS: HPI Transfer from Harlene Pepper, NP who is no longer with the practice.  Orena has anxiety, insomnia, and depression.  She has been weaning off Ativan , using mostly for sleep, and has done well, now on 0.5 mg bid prn. Is sleeping better with Trazodone . Feels rested when awakens. No PA, sometimes gets overwhelmed with life circumstances, but able to work through those times fairly easily.   Patient is able to enjoy things.  Energy and motivation are good.  No extreme sadness, tearfulness, or feelings of hopelessness.  ADLs and personal hygiene are normal.   Denies any changes in remembering things.  Appetite has not changed.  Weight is stable.  Denies suicidal or homicidal thoughts.  Patient denies increased energy with decreased need for sleep, increased talkativeness, racing thoughts, impulsivity or risky behaviors, increased spending, increased libido, grandiosity, increased irritability or anger, paranoia, or hallucinations.  Denies dizziness, syncope, seizures, numbness, tingling, tremor, tics, unsteady gait, slurred speech, confusion. Denies muscle or joint pain, stiffness, or dystonia.  Individual Medical History/ Review of Systems: Changes? :No   Past Psychiatric Medication Trials: She reports that some medications helped for short periods of time and then were not as effective. Prozac- had episodic low sodium levels Paxil Celexa Lexapro Effexor  XR- Took in 2016 Pristiq- Took in 2015 and had episode of hyponatremia  Cymbalta - Took in 2017 and had episode of hyponatremia then. Unable to tolerate 90 mg.  Wellbutrin - Caused headaches Buspar  Rexulti- Was helpful for mood. Caused weight gain.  Lithium- Started 3 years ago during  hospitalization Lamictal - caused some cognitive side effects Abilify - Took in 2016 Risperdal- Took in 2019. Has hyponatremia at that time.  Zyprexa- Took in May, 2019 Trileptal- Hyponatremia.  Carbamazepine- Caused severe hyponatremia.  Topamax -Cognitive side effects Gabapentin- unsure if this has been helpful. Reports taking long-term and reports that this was recently increased. Has been somewhat helpful for RLS.  Hydroxyzine- Unable to recall response.  Trazodone - Excessive daytime somnolence Cytomel Deplin- ineffective Diazepam- Took for vertigo/possible vestibular migraines Ativan   Allergies: Codeine, Doxycycline , Ibuprofen, Nickel, Nsaids, and Sulfa antibiotics  Current Medications:  Current Outpatient Medications:    acetaminophen  (TYLENOL ) 500 MG tablet, Take 1,000 mg by mouth once as needed for mild pain or headache., Disp: , Rfl:    atenolol  (TENORMIN ) 25 MG tablet, TAKE ONE TABLET BY MOUTH DAILY, PLEASE MAKE APPOINTMENT WITH PROVIDER, THIS IS THE LAST REFILL UNTIL THEN (Patient taking differently: Take 25 mg by mouth at bedtime.), Disp: 28 tablet, Rfl: 0   baclofen  (LIORESAL ) 20 MG tablet, Take 20 mg by mouth in the morning and at bedtime., Disp: , Rfl:    cyproheptadine  (PERIACTIN ) 4 MG tablet, Take 4 mg by mouth once., Disp: , Rfl:    denosumab  (PROLIA ) 60 MG/ML SOLN injection, Inject 60 mg into the skin every 6 (six) months. Administer in upper arm, thigh, or abdomen, Disp: , Rfl:    DULoxetine  (CYMBALTA ) 60 MG capsule, Take 1 capsule (60 mg total) by mouth daily., Disp: 90 capsule, Rfl: 1   estradiol  (ESTRACE  VAGINAL) 0.1 MG/GM vaginal cream, Place 1 g vaginally 3 (three) times a week., Disp: 42.5 g, Rfl: 2   famotidine (PEPCID) 20 MG tablet, Take 20 mg by mouth daily., Disp: , Rfl:    furosemide  (LASIX )  20 MG tablet, Take one (1) tablet by mouth ( 20 mg daily as needed for leg swelling., Disp: 30 tablet, Rfl: 3   ipratropium (ATROVENT ) 0.06 % nasal spray, Place 1 spray  into both nostrils in the morning and at bedtime., Disp: , Rfl:    levothyroxine  (SYNTHROID ) 112 MCG tablet, Take 112 mcg by mouth daily before breakfast. One hour before meal., Disp: , Rfl:    Magnesium 250 MG CAPS, Take by mouth 2 (two) times daily., Disp: , Rfl:    ondansetron  (ZOFRAN -ODT) 4 MG disintegrating tablet, Take by mouth as needed., Disp: , Rfl:    pantoprazole  (PROTONIX ) 40 MG tablet, Take 40 mg by mouth 2 (two) times daily., Disp: , Rfl:    Polyvinyl Alcohol -Povidone (REFRESH OP), Apply to eye., Disp: , Rfl:    rosuvastatin  (CRESTOR ) 20 MG tablet, Take 20 mg by mouth daily., Disp: , Rfl:    valACYclovir  (VALTREX ) 500 MG tablet, TAKE ONE TABLET BY MOUTH DAILY, Disp: 90 tablet, Rfl: 3   Vibegron (GEMTESA) 75 MG TABS, Take 75 mg by mouth daily., Disp: , Rfl:    Vitamin D, Ergocalciferol, (DRISDOL) 1.25 MG (50000 UNIT) CAPS capsule, Take 50,000 Units by mouth every 7 (seven) days., Disp: , Rfl:    busPIRone  (BUSPAR ) 10 MG tablet, Take 1 tablet (10 mg total) by mouth 2 (two) times daily., Disp: 180 tablet, Rfl: 1   LORazepam  (ATIVAN ) 0.5 MG tablet, 0.5 mg at bedtime, and 0.5 mg during the day prn., Disp: , Rfl:    NONFORMULARY OR COMPOUNDED ITEM, Testosterone  Cream 0.1% Apply 0.5gm to the skin of arm, leg, or abdomen daily and to rotate the sites. #30gm tube/bottle w/ two refill.  [employer]. (Patient not taking: Reported on 04/09/2023), Disp: 30 each, Rfl: 2   traZODone  (DESYREL ) 50 MG tablet, Take 1 tablet (50 mg total) by mouth at bedtime., Disp: 90 tablet, Rfl: 1 Medication Side Effects: none  Family Medical/ Social History: Changes? No  MENTAL HEALTH EXAM:  Last menstrual period 03/02/1988.There is no height or weight on file to calculate BMI.  General Appearance: Casual and Well Groomed  Eye Contact:  Good  Speech:  Clear and Coherent and Normal Rate  Volume:  Normal  Mood:  Euthymic  Affect:  Congruent  Thought Process:  Goal Directed and Descriptions of  Associations: Circumstantial  Orientation:  Full (Time, Place, and Person)  Thought Content: Logical   Suicidal Thoughts:  No  Homicidal Thoughts:  No  Memory:  WNL  Judgement:  Good  Insight:  Good  Psychomotor Activity:  Normal  Concentration:  Concentration: Good  Recall:  Good  Fund of Knowledge: Good  Language: Good  Assets:  Desire for Improvement Financial Resources/Insurance Housing Transportation  ADL's:  Intact  Cognition: WNL  Prognosis:  Good    DIAGNOSES:    ICD-10-CM   1. Recurrent major depressive disorder, in full remission (HCC)  F33.42     2. Primary insomnia  F51.01 traZODone  (DESYREL ) 50 MG tablet    LORazepam  (ATIVAN ) 0.5 MG tablet    3. Generalized anxiety disorder  F41.1 busPIRone  (BUSPAR ) 10 MG tablet    LORazepam  (ATIVAN ) 0.5 MG tablet      Receiving Psychotherapy: Yes  with Marval Bunde, LCSW   RECOMMENDATIONS:   PDMP review.  Ativan  filled 04/07/2023. I provided 25 minutes of face to face time during this encounter, including time spent before and after the visit in records review, medical decision making, counseling pertinent to today's  visit, and charting.   She has tolerated the decreased dose of Ativan  well, will continue it prn.   Continue Buspar  10 mg, 1 po bid. Continue Cymbalta  60 mg, 1 every day. Continue Ativan  0.5 mg, 1 po bid prn. Continue Trazodone  50 mg, 1 at bedtime.  Continue therapy with Marval Bunde, LCSW. Return in 6-8 weeks.  Verneita Cooks, PA-C

## 2023-04-13 ENCOUNTER — Ambulatory Visit: Payer: Medicare Other | Admitting: Psychiatry

## 2023-04-13 DIAGNOSIS — M533 Sacrococcygeal disorders, not elsewhere classified: Secondary | ICD-10-CM | POA: Diagnosis not present

## 2023-04-13 DIAGNOSIS — F411 Generalized anxiety disorder: Secondary | ICD-10-CM

## 2023-04-13 DIAGNOSIS — M7918 Myalgia, other site: Secondary | ICD-10-CM | POA: Diagnosis not present

## 2023-04-13 DIAGNOSIS — G8929 Other chronic pain: Secondary | ICD-10-CM | POA: Diagnosis not present

## 2023-04-13 DIAGNOSIS — M47812 Spondylosis without myelopathy or radiculopathy, cervical region: Secondary | ICD-10-CM | POA: Diagnosis not present

## 2023-04-13 NOTE — Progress Notes (Signed)
[facility] Counselor/Therapist Progress Note  Patient ID: JACQULYNN SHAPPELL, MRN: [id number],    Date: 04/13/2023  Time Spent: 53 minutes   Treatment Type: Individual Therapy  Reported Symptoms:  anxiety (some increase), frustration    Mental Status Exam:  Appearance:   Neat     Behavior:  Appropriate, Sharing, and Motivated  Motor:  Normal  Speech/Language:   Clear and Coherent  Affect:  anxious  Mood:  anxious  Thought process:  goal directed  Thought content:    WNL  Sensory/Perceptual disturbances:    WNL  Orientation:  oriented to person, place, time/date, situation, day of week, month of year, year, and stated date of Feb. 11, 2025  Attention:  Good  Concentration:  Good  Memory:  WNL  Fund of knowledge:   Good  Insight:    Good  Judgment:   Good  Impulse Control:  Good   Risk Assessment: Danger to Self:  No Self-injurious Behavior: No Danger to Others: No Duty to Warn:no Physical Aggression / Violence:No  Access to Firearms a concern: No  Gang Involvement:No   Subjective:  Patient today working further on her anxiety which has increased some, and reports some progress in managing her depression. Notes she has set limits in her watching stressful news on TV which aggravates her anxiety along with some interactions with her husband and how he can be very resistant to changes. Processing aggravations and how she can better react in healthier ways. Needed to share and work on some recent family/marital challenges where patient had been stressed and felt unsure but realized in talking her situation through, that she was actually making some sound decisions for her and her husband's situation now and into the future.  Also worked further on her relationship and communication with her older daughter which has been challenging off and on in recent years.  Seem to have what she is hoping is a "break-through" with daughter and some recent online communication with her, as  the focus of the conversation was positive.  Continues to be attentive and listen to daughter however has cut back on "trying to fix everything".  Does worry about the mental/emotional health of both adult daughters.  Patient does continue to use the boundaries she has set with oldest daughter as it was needed.  Continues to enjoy the townhome that they moved into and has met several neighbors already.  Patient reports continued focus on her physical and emotional health and feels she is making progress.  Continues to work on her grief regarding the loss of her previous dog, and does sense the gradual progress she is making.  Interventions: Cognitive Behavioral Therapy and Ego-Supportive  Long term goal: Develop healthy cognitive patterns and beliefs about self and the world that lead to alleviation of depression and anxiety, and help prevent relapse of depression and anxiety. Short term goal: Learn and implement personal skills for managing stress, solving daily problems, and resolving conflicts effectively.  Strategies: Use modeling and role-playing to help recognize anxious/depressive/negative thought patterns that create anxious/depressive/negative feelings and actions, interrupt them and replace with more positive reality-based thoughts that do not support depression.  Diagnosis:   ICD-10-CM   1. Generalized anxiety disorder  F41.1      Plan: Patient showing motivation today in her work on her anxiety and some personal/family related issues, particularly with her older daughter and husband.  Is conscientious about her treatment goals and feels good about some of her progress.  Reminded and encouraged patient in her practice of more positive and self affirming behaviors as noted in sessions including: Interrupting anxious/depressive thoughts and challenge them to replace with more realistic thoughts, healthy boundaries within the family, contact with supportive people, remain in the present  focusing on what she can change or control, believing more in herself that she can make and keep significant positive changes that will help her feel better about herself and her future, staying connected with her church family which is very supportive of her and her husband, allow her faith to be a healing resource for her emotionally as well as spiritually, use of positive self talk, and realize the strength she shows when working with goal-directed behaviors to move in a direction that supports improved emotional health and her overall outlook into the future.  Patient has definitely shown some progress and needs to keep working on her goal-directed behaviors so as to move in a healthier and more hopeful direction for her life.  Goal review and progress/challenges noted with patient.  Next appointment within 3 weeks.   Mathis Fare, LCSW

## 2023-04-15 NOTE — Progress Notes (Deleted)
 Cardiology Office Note:  .   Date:  04/15/2023  ID:  [name], DOB 1951/02/11, MRN [id number] PCP: Chilton Greathouse, MD  [facility] Providers Cardiologist:  Kristeen Miss, MD    Patient Profile: .      PMH Palpitations/PVCs Coronary CTA 01/13/2019 Calcium score of 0  No significant CAD Hyperlipidemia Chest pain  Referred to cardiology for chest pain and palpitations and seen by Dr. Elease Hashimoto 11/2014.  She reported chest pain for few months associated with palpitations and also feeling like she cannot get a good breath.  These occur sporadically and are not associated with exercise.  Episodes last several minutes.  Recent TTE had revealed normal LVEF 55 to 60%, mildly reduced diastolic function, and mild TR.  Cardiac monitor completed 12/17/2014 revealed sinus rhythm with occasional PVCs.  She was started on propranolol 20 mg which did help some.  She was later switched to metoprolol.  She continued to have chest pain and underwent Lexiscan Myoview 12/07/2018 which showed no ischemia and EF 78%.  She had hallucinations with metoprolol and was switched to atenolol.  Coronary CTA 01/2019 revealed no evidence of CAD and coronary calcium score of 0.  Seen in clinic on 04/01/2021 by Dr. Elease Hashimoto.  She was feeling better on atenolol for palpitations.  She was felt to be stable from cardiology perspective and planned to have PCP assume responsibility of atenolol prescription.  Seen by me on 01/19/23 for  chest pain and leg swelling. Her palpitations have been well managed with atenolol. She has developed leg swelling, previously only noted in the ankles now progressed to involve the entire lower extremity from the knees down. Swelling is associated with chest discomfort, described as pressure, which is random in occurrence and not related to activity. No associated shortness of breath, nausea, or lightheadedness. Symptoms started approximately two months ago. She has been managing the leg swelling  by elevating the legs during sleep and wearing compression socks, which have helped but are uncomfortable. Has history of acid reflux but this chest discomfort is different. Lives a sedentary lifestyle, with daily activities limited to household chores and walking a new puppy. She has not noticed any chest pain during these activities. Occasional dizziness upon standing but generally hydrates well. Admits to more consumption of processed/convenience foods and the need to limit salt intake. Family history is significant for her father who had CABG age 72s, lived to be 75 y/o. We obtained echo 02/12/23 which revealed normal LVEF 55 to 60%, normal diastolic parameters, normal RV, mild calcification of the aortic valve with no evidence of stenosis, trivial MR.  She was encouraged to limit sodium to under 2000 mg daily and was given prescription for as needed Lasix 20 mg daily.      History of Present Illness: .   [name] is a very pleasant 73 y.o. female who is here today for follow-up of leg swelling and chest pain.   Discussed the use of AI scribe software for clinical note transcription with the patient, who gave verbal consent to proceed.   ROS: See HPI       Studies Reviewed: .         Risk Assessment/Calculations:     No BP recorded.  {Refresh Note OR Click here to enter BP  :1}***       Physical Exam:   VS:  LMP 03/02/1988 (Approximate)    Wt Readings from Last 3 Encounters:  01/19/23 159 lb 12.8 oz (72.5  kg)  12/25/22 160 lb (72.6 kg)  10/20/22 164 lb (74.4 kg)    GEN: Well nourished, well developed in no acute distress NECK: No JVD; No carotid bruits CARDIAC: RRR, no murmurs, rubs, gallops RESPIRATORY:  Clear to auscultation without rales, wheezing or rhonchi  ABDOMEN: Soft, non-tender, non-distended EXTREMITIES:  Mild bilateral non-pitting LE edema; No deformity     ASSESSMENT AND PLAN: .    Chest pain: New onset leg swelling over the past two months associated with  chest discomfort. Chest pain does not worsen with exertion and is not associated with additional symptoms of n/v, diaphoresis, or shortness of breath. Chest discomfort is different from previous palpitations and with GERD. She denies orthopnea, PND, presyncope, syncope. Weight has been stable. Appears euvolemic on exam. EKG reveals sinus rhythm with nonspecific interventricular conduction delay we will get echocardiogram to assess heart and valve function. Consider stress test if echocardiogram is normal and chest discomfort continues. Follow up in 2-3 months.  Leg swelling: New onset leg swelling over the past two months that has progressed from being evident only in ankles to now complete lower leg. Swelling also associated with chest discomfort. It does improve with elevation and compression socks. No significant swelling today. She admits to more dietary indiscretion with sodium recently. Encouraged low sodium diet.  She would like to try Lasix 20 mg as needed.  I have asked her to notify us if she takes this medication on a regular basis so that we can check renal function and potassium.  Labs from 01/12/2023 reveal SCr 1.16, K 4.8. continue leg elevation and compression.  We are getting echocardiogram as noted above.   Palpitations: Quiescent at this time. Continue atenolol.   Hypertension: BP is well controlled. No medication changes today.   Incomplete RBBB: EKG reviewed with Dr. Elease Hashimoto, primary cardiologist due to change in morphology of QRS complex, felt by him to represent incomplete RBBB or nonspecific IVCD. Information provided to patient on this finding.         Disposition: ***  Signed, Eligha Bridegroom, NP-C

## 2023-04-20 ENCOUNTER — Ambulatory Visit: Payer: Medicare Other | Admitting: Nurse Practitioner

## 2023-04-28 DIAGNOSIS — M533 Sacrococcygeal disorders, not elsewhere classified: Secondary | ICD-10-CM | POA: Diagnosis not present

## 2023-04-28 DIAGNOSIS — H903 Sensorineural hearing loss, bilateral: Secondary | ICD-10-CM | POA: Diagnosis not present

## 2023-05-03 DIAGNOSIS — H918X3 Other specified hearing loss, bilateral: Secondary | ICD-10-CM | POA: Diagnosis not present

## 2023-05-03 DIAGNOSIS — H9313 Tinnitus, bilateral: Secondary | ICD-10-CM | POA: Diagnosis not present

## 2023-05-03 DIAGNOSIS — H9120 Sudden idiopathic hearing loss, unspecified ear: Secondary | ICD-10-CM | POA: Diagnosis not present

## 2023-05-04 ENCOUNTER — Ambulatory Visit: Payer: Medicare Other | Admitting: Psychiatry

## 2023-05-11 DIAGNOSIS — H9120 Sudden idiopathic hearing loss, unspecified ear: Secondary | ICD-10-CM | POA: Diagnosis not present

## 2023-05-11 DIAGNOSIS — R42 Dizziness and giddiness: Secondary | ICD-10-CM | POA: Diagnosis not present

## 2023-05-11 DIAGNOSIS — H903 Sensorineural hearing loss, bilateral: Secondary | ICD-10-CM | POA: Diagnosis not present

## 2023-05-11 DIAGNOSIS — H9313 Tinnitus, bilateral: Secondary | ICD-10-CM | POA: Diagnosis not present

## 2023-05-12 DIAGNOSIS — M25552 Pain in left hip: Secondary | ICD-10-CM | POA: Diagnosis not present

## 2023-05-17 ENCOUNTER — Other Ambulatory Visit: Payer: Self-pay

## 2023-05-17 DIAGNOSIS — B009 Herpesviral infection, unspecified: Secondary | ICD-10-CM

## 2023-05-17 MED ORDER — VALACYCLOVIR HCL 500 MG PO TABS
ORAL_TABLET | ORAL | 0 refills | Status: DC
Start: 2023-05-17 — End: 2023-05-31
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
## 2023-05-17 NOTE — Telephone Encounter (Signed)
 Med refill request: valtrex Last AEX: 05/26/22 Next AEX: 05/31/23 Last MMG (if hormonal med) 11/03/22 birads cat 1 neg  Refill authorized: Last rx 05/26/22 #90 with 3 refills. Please approve or deny

## 2023-05-18 ENCOUNTER — Encounter: Payer: Self-pay | Admitting: Neurology

## 2023-05-20 DIAGNOSIS — R42 Dizziness and giddiness: Secondary | ICD-10-CM | POA: Diagnosis not present

## 2023-05-20 DIAGNOSIS — H905 Unspecified sensorineural hearing loss: Secondary | ICD-10-CM | POA: Diagnosis not present

## 2023-05-20 DIAGNOSIS — H9313 Tinnitus, bilateral: Secondary | ICD-10-CM | POA: Diagnosis not present

## 2023-05-20 DIAGNOSIS — H9120 Sudden idiopathic hearing loss, unspecified ear: Secondary | ICD-10-CM | POA: Diagnosis not present

## 2023-05-24 DIAGNOSIS — G8929 Other chronic pain: Secondary | ICD-10-CM | POA: Diagnosis not present

## 2023-05-24 DIAGNOSIS — M5416 Radiculopathy, lumbar region: Secondary | ICD-10-CM | POA: Diagnosis not present

## 2023-05-24 DIAGNOSIS — M7918 Myalgia, other site: Secondary | ICD-10-CM | POA: Diagnosis not present

## 2023-05-24 DIAGNOSIS — M47812 Spondylosis without myelopathy or radiculopathy, cervical region: Secondary | ICD-10-CM | POA: Diagnosis not present

## 2023-05-25 ENCOUNTER — Ambulatory Visit (INDEPENDENT_AMBULATORY_CARE_PROVIDER_SITE_OTHER): Payer: Medicare Other | Admitting: Psychiatry

## 2023-05-25 DIAGNOSIS — F411 Generalized anxiety disorder: Secondary | ICD-10-CM

## 2023-05-25 NOTE — Progress Notes (Signed)
 [facility] Counselor/Therapist Progress Note  Patient ID: [name], MRN: [id number],    Date: 05/25/2023  Time Spent: 50 minutes   Treatment Type: Individual Therapy  Reported Symptoms: anxiety, depression; has been diagnosed with Meniere's disease   Mental Status Exam:  Appearance:   Casual and Neat     Behavior:  Appropriate, Sharing, and Motivated  Motor:  Normal  Speech/Language:   Clear and Coherent  Affect:  Depressed and anxiety  Mood:  anxious and depressed  Thought process:  goal directed  Thought content:    WNL  Sensory/Perceptual disturbances:    WNL  Orientation:  oriented to person, place, time/date, situation, day of week, month of year, year, and stated date of May 25, 2023  Attention:  Good  Concentration:  Good  Memory:  WNL  Fund of knowledge:   Good  Insight:    Good  Judgment:   Good  Impulse Control:  Good   Risk Assessment: Danger to Self:  No Self-injurious Behavior: No Danger to Others: No Duty to Warn:no Physical Aggression / Violence:No  Access to Firearms a concern: No  Gang Involvement:No   Subjective:   Patient in session today continuing her work on anxiety and depression, along with being recently diagnosed with Menieres disease. Processed today what the Menieres means for her and how she is adjusting physically and emotionally. Also needing to work on some family interactions that have been very hurtful recently especially with her older adult daughter. Did process the issues involved with this daughter and able to talk through patient's hurt and some anger. Tearful as she shares how she wishes that she would have done some things differently. Working on loving herself, "especially through emotionally challenging times. Is now setting better limits on watching stressful programming on TV, especially political programming that accentuates patient's anxiety. Also working on responding in more helpful ways to older adult daughter.  Realizing she can't fix everything and trying to "let go". Remains concerned about both adult daughters and their mental/emotional health. Working to maintain healthier boundaries within family.   Interventions: Cognitive Behavioral Therapy and Ego-Supportive  Long term goal: Develop healthy cognitive patterns and beliefs about self and the world that lead to alleviation of depression and anxiety, and help prevent relapse of depression and anxiety. Short term goal: Learn and implement personal skills for managing stress, solving daily problems, and resolving conflicts effectively.  Strategies: Use modeling and role-playing to help recognize anxious/depressive/negative thought patterns that create anxious/depressive/negative feelings and actions, interrupt them and replace with more positive reality-based thoughts that do not support depression.  Diagnosis:   ICD-10-CM   1. Generalized anxiety disorder  F41.1      Plan: Patient showing really good motivation today as she worked further on her anxiety, depression, family concerns especially with strong criticism from an older daughter and with whom patient has tried to work towards a better relationship, and patient's recent diagnosis of Mnire's disease.  Patient showing a lot of strength during difficult times and wanting to make well thought through decisions.  Is very conscientious about treatment goals and remains very motivated.  Her church family is a "rock" for her.  Reminded and encouraged patient to be practicing more positive and self affirming behaviors as noted in sessions including: Interrupt anxious/depressive thoughts and challenge them to replace with more realistic thoughts, healthy boundaries within the family, contact with supportive people, remain in the present focusing on what she can control or change,  believing more in herself that she can make and keep significant positive changes that will help her feel better about herself  and her future, staying connected with her church family which is very supportive of her and her husband, allow her faith to be a healing resource for her emotionally as well as spiritually, use of positive self talk, and recognize the strength she shows when working with goal-directed behaviors to move in a direction that supports improved emotional health and her overall outlook into the future.  Felipe Cabell has definitely shown progress and needs to keep working on her goal-directed behaviors so as to move in a healthier and more hopeful direction going forward in life.  Goal review and progress/challenges noted with patient.  Next appointment within 3 weeks.   Mathis Fare, LCSW

## 2023-05-27 DIAGNOSIS — H8112 Benign paroxysmal vertigo, left ear: Secondary | ICD-10-CM | POA: Diagnosis not present

## 2023-05-28 DIAGNOSIS — H0102B Squamous blepharitis left eye, upper and lower eyelids: Secondary | ICD-10-CM | POA: Diagnosis not present

## 2023-05-28 DIAGNOSIS — H0102A Squamous blepharitis right eye, upper and lower eyelids: Secondary | ICD-10-CM | POA: Diagnosis not present

## 2023-05-28 DIAGNOSIS — H0288A Meibomian gland dysfunction right eye, upper and lower eyelids: Secondary | ICD-10-CM | POA: Diagnosis not present

## 2023-05-28 DIAGNOSIS — H02051 Trichiasis without entropian right upper eyelid: Secondary | ICD-10-CM | POA: Diagnosis not present

## 2023-05-28 DIAGNOSIS — H16223 Keratoconjunctivitis sicca, not specified as Sjogren's, bilateral: Secondary | ICD-10-CM | POA: Diagnosis not present

## 2023-05-28 DIAGNOSIS — H0288B Meibomian gland dysfunction left eye, upper and lower eyelids: Secondary | ICD-10-CM | POA: Diagnosis not present

## 2023-05-28 DIAGNOSIS — H00012 Hordeolum externum right lower eyelid: Secondary | ICD-10-CM | POA: Diagnosis not present

## 2023-05-28 DIAGNOSIS — H0279 Other degenerative disorders of eyelid and periocular area: Secondary | ICD-10-CM | POA: Diagnosis not present

## 2023-05-31 ENCOUNTER — Encounter: Payer: Self-pay | Admitting: Obstetrics and Gynecology

## 2023-05-31 ENCOUNTER — Ambulatory Visit (INDEPENDENT_AMBULATORY_CARE_PROVIDER_SITE_OTHER): Payer: Medicare Other | Admitting: Obstetrics and Gynecology

## 2023-05-31 VITALS — BP 126/74 | HR 72 | Ht 65.0 in | Wt 148.0 lb

## 2023-05-31 DIAGNOSIS — Z5181 Encounter for therapeutic drug level monitoring: Secondary | ICD-10-CM

## 2023-05-31 DIAGNOSIS — N958 Other specified menopausal and perimenopausal disorders: Secondary | ICD-10-CM

## 2023-05-31 DIAGNOSIS — Z9189 Other specified personal risk factors, not elsewhere classified: Secondary | ICD-10-CM | POA: Diagnosis not present

## 2023-05-31 DIAGNOSIS — Z01419 Encounter for gynecological examination (general) (routine) without abnormal findings: Secondary | ICD-10-CM

## 2023-05-31 DIAGNOSIS — B009 Herpesviral infection, unspecified: Secondary | ICD-10-CM

## 2023-05-31 MED ORDER — VALACYCLOVIR HCL 500 MG PO TABS: ORAL_TABLET | ORAL | 3 refills | Status: AC

## 2023-05-31 MED ORDER — FLUCONAZOLE 150 MG PO TABS: 150.0000 mg | ORAL_TABLET | Freq: Once | ORAL | 0 refills | Status: AC

## 2023-05-31 MED ORDER — ESTRADIOL 0.1 MG/GM VA CREA: 1.0000 g | TOPICAL_CREAM | VAGINAL | 2 refills | Status: DC

## 2023-05-31 NOTE — Patient Instructions (Signed)

## 2023-06-03 ENCOUNTER — Ambulatory Visit: Payer: Medicare Other | Admitting: Physician Assistant

## 2023-06-07 DIAGNOSIS — M48061 Spinal stenosis, lumbar region without neurogenic claudication: Secondary | ICD-10-CM | POA: Diagnosis not present

## 2023-06-07 DIAGNOSIS — M4726 Other spondylosis with radiculopathy, lumbar region: Secondary | ICD-10-CM | POA: Diagnosis not present

## 2023-06-07 DIAGNOSIS — M5116 Intervertebral disc disorders with radiculopathy, lumbar region: Secondary | ICD-10-CM | POA: Diagnosis not present

## 2023-06-09 DIAGNOSIS — G5701 Lesion of sciatic nerve, right lower limb: Secondary | ICD-10-CM | POA: Diagnosis not present

## 2023-06-10 DIAGNOSIS — K219 Gastro-esophageal reflux disease without esophagitis: Secondary | ICD-10-CM | POA: Diagnosis not present

## 2023-06-15 DIAGNOSIS — G43019 Migraine without aura, intractable, without status migrainosus: Secondary | ICD-10-CM | POA: Diagnosis not present

## 2023-06-17 ENCOUNTER — Ambulatory Visit: Payer: Medicare Other | Admitting: Psychiatry

## 2023-06-17 DIAGNOSIS — F411 Generalized anxiety disorder: Secondary | ICD-10-CM | POA: Diagnosis not present

## 2023-06-17 NOTE — Progress Notes (Signed)
 [facility] Counselor/Therapist Progress Note  Patient ID: [name], MRN: [id number],    Date: 06/17/2023  Time Spent: 50 minutes   Treatment Type: Individual Therapy  Virtual Visit via Telehealth Note: MyChart Video Connected with patient by a telemedicine/telehealth application, with their informed consent, and verified patient privacy and that I am speaking with the correct person using two identifiers. I discussed the limitations, risks, security and privacy concerns of performing psychotherapy and the availability of in person appointments. I also discussed with the patient that there may be a patient responsible charge related to this service. The patient expressed understanding and agreed to proceed. I discussed the treatment planning with the patient. The patient was provided an opportunity to ask questions and all were answered. The patient agreed with the plan and demonstrated an understanding of the instructions. The patient was advised to call  our office if  symptoms worsen or feel they are in a crisis state and need immediate contact.   Therapist Location: office Patient Location: home   Reported Symptoms: anxiety, depression, stressing over conflict with older daughter, some difficulty sleeping   Mental Status Exam:  Appearance:   Casual and Neat     Behavior:  Appropriate, Sharing, and Motivated  Motor:  Normal  Speech/Language:   Clear and Coherent  Affect:  Depressed and anxious  Mood:  anxious  Thought process:  goal directed  Thought content:    Rumination and some rumination re: adult daughter  Sensory/Perceptual disturbances:    WNL  Orientation:  oriented to person, place, time/date, situation, day of week, month of year, year, and stated date of June 17, 2023  Attention:  Good  Concentration:  Good  Memory:  WNL  Fund of knowledge:   Good  Insight:    Good  Judgment:   Good  Impulse Control:  Good   Risk Assessment: Danger to Self:   No Self-injurious Behavior: No Danger to Others: No Duty to Warn:no Physical Aggression / Violence:No  Access to Firearms a concern: No  Gang Involvement:No   Subjective:  Patient today being seen in a MyChart Video session as she continues to work on her anxiety and depression, and some concerns after being diagnosed with Meniere's disease recently and talked further about this in session today. Fear of the unknown with the Meniere's and getting more information. Her Dr has prescribed her a medication x3 daily to help with her Meniere's. Significant hurt by older daughter's "mean", hurtful comments blaming patient for so many things out of her control. Shared and processed these comments and her hurt feelings,in more detail,  getting support, and realizing more that the comments from daughter are hurtful but not accurate.  Patient stressed as she wants to be able to have some type of relationship with her daughter however not allowing daughter to manipulate her in ways that significantly impact patient emotionally.  Continues to work on loving herself especially through times when things feel more challenging and out of her control.  Also continues to be mindful of boundaries and what seems to help with daughter's relationship and her mental/emotional health status.  Interventions: Cognitive Behavioral Therapy and Ego-Supportive Long term goal: Develop healthy cognitive patterns and beliefs about self and the world that lead to alleviation of depression and anxiety, and help prevent relapse of depression and anxiety. Short term goal: Learn and implement personal skills for managing stress, solving daily problems, and resolving conflicts effectively.  Strategies: Use modeling and role-playing  to help recognize anxious/depressive/negative thought patterns that create anxious/depressive/negative feelings and actions, interrupt them and replace with more positive reality-based thoughts that do not  support depression.   Diagnosis:   ICD-10-CM   1. Generalized anxiety disorder  F41.1      Plan:   Patient today actively participating in session today focusing more on her anxiety, family relationships, depression, conflict and criticism from oldest daughter, and patient's diagnosis of Mnire's disease.  Husband is supportive as are a few close friends along with her church family which is a real strength for patient. Encouraged patient in her practice of more positive and self affirming behaviors as noted in sessions including: Stay in the present focusing on what she can control or change, believe more in herself that she can make and keep significant positive changes that will help her feel better about herself and her future, interrupt anxious/depressive thoughts and challenge them to replace with more realistic thoughts, healthy boundaries within the family and beyond, contact with supportive people, staying connected within her church family which is very supportive of her and her husband, allow her faith to be a healing resource for her emotionally as well as spiritually, positive self talk, and realize the strength she shows when working with goal-directed behaviors to move in a direction that supports her improved emotional health and overall wellbeing.  [name] is showing progress and needs to continue her work with goal-directed behaviors to move in a healthier and more hopeful direction into her future.  Goal review and progress/challenges noted with patient.  Next appointment within 3 weeks.   Kelleen Patee, LCSW

## 2023-06-21 DIAGNOSIS — M47812 Spondylosis without myelopathy or radiculopathy, cervical region: Secondary | ICD-10-CM | POA: Diagnosis not present

## 2023-06-21 DIAGNOSIS — M5416 Radiculopathy, lumbar region: Secondary | ICD-10-CM | POA: Diagnosis not present

## 2023-06-21 DIAGNOSIS — M7918 Myalgia, other site: Secondary | ICD-10-CM | POA: Diagnosis not present

## 2023-06-21 DIAGNOSIS — G8929 Other chronic pain: Secondary | ICD-10-CM | POA: Diagnosis not present

## 2023-06-22 DIAGNOSIS — K2100 Gastro-esophageal reflux disease with esophagitis, without bleeding: Secondary | ICD-10-CM | POA: Diagnosis not present

## 2023-06-22 DIAGNOSIS — R0789 Other chest pain: Secondary | ICD-10-CM | POA: Diagnosis not present

## 2023-06-23 ENCOUNTER — Ambulatory Visit: Admitting: Physician Assistant

## 2023-06-23 ENCOUNTER — Encounter: Payer: Self-pay | Admitting: Physician Assistant

## 2023-06-23 DIAGNOSIS — F411 Generalized anxiety disorder: Secondary | ICD-10-CM

## 2023-06-23 DIAGNOSIS — F5101 Primary insomnia: Secondary | ICD-10-CM | POA: Diagnosis not present

## 2023-06-23 DIAGNOSIS — F330 Major depressive disorder, recurrent, mild: Secondary | ICD-10-CM | POA: Diagnosis not present

## 2023-06-23 MED ORDER — LORAZEPAM 0.5 MG PO TABS: 0.2500 mg | ORAL_TABLET | Freq: Two times a day (BID) | ORAL | 1 refills | Status: DC | PRN

## 2023-06-23 NOTE — Progress Notes (Signed)
 [facility] Med Check  Patient ID: [name],  MRN: [id number]  PCP: Avva, Ravisankar, MD  Date of Evaluation: 06/23/2023 Time spent:20 minutes  Chief Complaint:  Chief Complaint   Anxiety; Depression; Insomnia; Follow-up    HISTORY/CURRENT STATUS: HPI For routine med check.  Has been kind of down due to new health problems, diagnosed with Mnire's disease recently.  She is better after the new treatment regimen but still has tinnitus and her hearing is worse.  She is following up with ENT and audiology.  She is also sad due to the relationship with her adult daughters. Sleeps fairly well.  ADLs and personal hygiene are normal.   Denies any changes in concentration, making decisions, or remembering things.  Appetite has not changed.  Weight is stable.  She has situational anxiety but is not taking the Ativan  unless she absolutely has to.  Denies suicidal or homicidal thoughts.  Patient denies increased energy with decreased need for sleep, increased talkativeness, racing thoughts, impulsivity or risky behaviors, increased spending, increased libido, grandiosity, increased irritability or anger, paranoia, or hallucinations.  Denies syncope, seizures, numbness, tingling, tremor, tics, unsteady gait, slurred speech, confusion. Denies muscle or joint pain, stiffness, or dystonia.  Individual Medical History/ Review of Systems: Changes? :Yes   dx with [facility], saw ENT given steroids and Dyazide.   Past Psychiatric Medication Trials: She reports that some medications helped for short periods of time and then were not as effective. Prozac- had episodic low sodium levels Paxil Celexa Lexapro Effexor  XR- Took in 2016 Pristiq- Took in 2015 and had episode of hyponatremia  Cymbalta - Took in 2017 and had episode of hyponatremia then. Unable to tolerate 90 mg.  Wellbutrin - Caused headaches Buspar  Rexulti- Was helpful for mood. Caused weight gain.  Lithium- Started 3 years ago during  hospitalization Lamictal - caused some cognitive side effects Abilify - Took in 2016 Risperdal- Took in 2019. Has hyponatremia at that time.  Zyprexa- Took in May, 2019 Trileptal- Hyponatremia.  Carbamazepine- Caused severe hyponatremia.  Topamax -Cognitive side effects Gabapentin- unsure if this has been helpful. Reports taking long-term and reports that this was recently increased. Has been somewhat helpful for RLS.  Hydroxyzine- Unable to recall response.  Trazodone - Excessive daytime somnolence Cytomel Deplin- ineffective Diazepam- Took for vertigo/possible vestibular migraines Ativan   Allergies: Codeine, Doxycycline , Ibuprofen, Nickel, Nsaids, and Sulfa antibiotics  Current Medications:  Current Outpatient Medications:    acetaminophen  (TYLENOL ) 500 MG tablet, Take 1,000 mg by mouth once as needed for mild pain or headache., Disp: , Rfl:    atenolol  (TENORMIN ) 25 MG tablet, TAKE ONE TABLET BY MOUTH DAILY, PLEASE MAKE APPOINTMENT WITH PROVIDER, THIS IS THE LAST REFILL UNTIL THEN (Patient taking differently: Take 25 mg by mouth at bedtime.), Disp: 28 tablet, Rfl: 0   baclofen  (LIORESAL ) 20 MG tablet, Take 20 mg by mouth in the morning and at bedtime., Disp: , Rfl:    Betahistine HCl POWD, 16 mg by Does not apply route 3 (three) times daily., Disp: , Rfl:    busPIRone  (BUSPAR ) 10 MG tablet, Take 1 tablet (10 mg total) by mouth 2 (two) times daily., Disp: 180 tablet, Rfl: 1   cyproheptadine  (PERIACTIN ) 4 MG tablet, Take 4 mg by mouth once., Disp: , Rfl:    denosumab  (PROLIA ) 60 MG/ML SOLN injection, Inject 60 mg into the skin every 6 (six) months. Administer in upper arm, thigh, or abdomen, Disp: , Rfl:    DULoxetine  (CYMBALTA ) 60 MG capsule, Take 1 capsule (60 mg total) by mouth  daily., Disp: 90 capsule, Rfl: 1   estradiol  (ESTRACE  VAGINAL) 0.1 MG/GM vaginal cream, Place 1 g vaginally 3 (three) times a week., Disp: 42.5 g, Rfl: 2   famotidine (PEPCID) 20 MG tablet, Take 20 mg by mouth  daily., Disp: , Rfl:    ipratropium (ATROVENT ) 0.06 % nasal spray, Place 1 spray into both nostrils in the morning and at bedtime., Disp: , Rfl:    levothyroxine  (SYNTHROID ) 112 MCG tablet, Take 112 mcg by mouth daily before breakfast. One hour before meal., Disp: , Rfl:    Magnesium 250 MG CAPS, Take by mouth 2 (two) times daily., Disp: , Rfl:    ondansetron  (ZOFRAN -ODT) 4 MG disintegrating tablet, Take by mouth as needed., Disp: , Rfl:    pantoprazole  (PROTONIX ) 40 MG tablet, Take 40 mg by mouth 2 (two) times daily., Disp: , Rfl:    Polyvinyl Alcohol -Povidone (REFRESH OP), Apply to eye., Disp: , Rfl:    rosuvastatin  (CRESTOR ) 20 MG tablet, Take 20 mg by mouth daily., Disp: , Rfl:    traZODone  (DESYREL ) 50 MG tablet, Take 1 tablet (50 mg total) by mouth at bedtime., Disp: 90 tablet, Rfl: 1   triamterene-hydrochlorothiazide (DYAZIDE) 37.5-25 MG capsule, Take 1 capsule by mouth every morning., Disp: , Rfl:    valACYclovir  (VALTREX ) 500 MG tablet, TAKE ONE TABLET BY MOUTH DAILY, Disp: 90 tablet, Rfl: 3   Vibegron (GEMTESA) 75 MG TABS, Take 75 mg by mouth daily., Disp: , Rfl:    Vitamin D, Ergocalciferol, (DRISDOL) 1.25 MG (50000 UNIT) CAPS capsule, Take 50,000 Units by mouth every 7 (seven) days., Disp: , Rfl:    LORazepam  (ATIVAN ) 0.5 MG tablet, Take 0.5-1 tablets (0.25-0.5 mg total) by mouth 2 (two) times daily as needed for anxiety., Disp: 30 tablet, Rfl: 1 Medication Side Effects: none  Family Medical/ Social History: Changes? No  MENTAL HEALTH EXAM:  Last menstrual period 03/02/1988.There is no height or weight on file to calculate BMI.  General Appearance: Casual and Well Groomed  Eye Contact:  Good  Speech:  Clear and Coherent and Normal Rate  Volume:  Normal  Mood:  Euthymic  Affect:  Congruent  Thought Process:  Goal Directed and Descriptions of Associations: Circumstantial  Orientation:  Full (Time, Place, and Person)  Thought Content: Logical   Suicidal Thoughts:  No   Homicidal Thoughts:  No  Memory:  WNL  Judgement:  Good  Insight:  Good  Psychomotor Activity:  Normal  Concentration:  Concentration: Good and Attention Span: Good  Recall:  Good  Fund of Knowledge: Good  Language: Good  Assets:  Desire for Improvement Financial Resources/Insurance Housing Transportation  ADL's:  Intact  Cognition: WNL  Prognosis:  Good   DIAGNOSES:    ICD-10-CM   1. Major depressive disorder, recurrent episode, mild (HCC)  F33.0     2. Primary insomnia  F51.01 LORazepam  (ATIVAN ) 0.5 MG tablet    3. Generalized anxiety disorder  F41.1 LORazepam  (ATIVAN ) 0.5 MG tablet      Receiving Psychotherapy: Yes  with Reid Capuchin, LCSW  RECOMMENDATIONS:   PDMP review.  Ativan  filled 05/16/2023. I provided 20 minutes of face to face time during this encounter, including time spent before and after the visit in records review, medical decision making, counseling pertinent to today's visit, and charting.   We discussed the Ativan .  I encouraged her to take it if needed, sometimes it can help dizziness and nausea.  But I understand she has been weaning off and we do  not want to go backwards.  If the sadness she feels does not improve soon and there is no trigger, then I will probably increase the Cymbalta .  For now no changes in medications will be made.  Continue Buspar  10 mg, 1 po bid. Continue Cymbalta  60 mg, 1 every day. Continue Ativan  0.5 mg, 1 po qd prn. Continue Trazodone  50 mg, 1 at bedtime.  Continue therapy with Reid Capuchin, LCSW. Return in 2 months.   Marvia Slocumb, PA-C

## 2023-06-24 ENCOUNTER — Other Ambulatory Visit (HOSPITAL_COMMUNITY): Payer: Self-pay

## 2023-06-25 ENCOUNTER — Encounter (HOSPITAL_COMMUNITY)

## 2023-06-28 ENCOUNTER — Ambulatory Visit (HOSPITAL_COMMUNITY)
Admission: RE | Admit: 2023-06-28 | Discharge: 2023-06-28 | Disposition: A | Discharge status: Home / Self Care | Source: Ambulatory Visit | Attending: Internal Medicine | Admitting: Internal Medicine

## 2023-06-28 DIAGNOSIS — Z79620 Long term (current) use of immunosuppressive biologic: Secondary | ICD-10-CM | POA: Diagnosis not present

## 2023-06-28 DIAGNOSIS — M810 Age-related osteoporosis without current pathological fracture: Secondary | ICD-10-CM | POA: Insufficient documentation

## 2023-06-28 DIAGNOSIS — M79605 Pain in left leg: Secondary | ICD-10-CM | POA: Diagnosis not present

## 2023-06-28 DIAGNOSIS — M79604 Pain in right leg: Secondary | ICD-10-CM | POA: Diagnosis not present

## 2023-06-28 MED ORDER — DENOSUMAB 60 MG/ML ~~LOC~~ SOSY
PREFILLED_SYRINGE | SUBCUTANEOUS | Status: AC
  Administered 2023-06-28: 60 mg via SUBCUTANEOUS
  Filled 2023-06-28: qty 1

## 2023-06-28 MED ORDER — DENOSUMAB 60 MG/ML ~~LOC~~ SOSY: 60.0000 mg | PREFILLED_SYRINGE | Freq: Once | SUBCUTANEOUS | Status: AC

## 2023-07-02 DIAGNOSIS — M5416 Radiculopathy, lumbar region: Secondary | ICD-10-CM | POA: Diagnosis not present

## 2023-07-08 ENCOUNTER — Ambulatory Visit: Admitting: Psychiatry

## 2023-07-08 DIAGNOSIS — F411 Generalized anxiety disorder: Secondary | ICD-10-CM | POA: Diagnosis not present

## 2023-07-08 NOTE — Progress Notes (Signed)
 [facility] Counselor/Therapist Progress Note  Patient ID: [name], MRN: [id number],    Date: 07/08/2023  Time Spent: 50 minutes   Treatment Type: Individual Therapy  Reported Symptoms: anxiety, depression, "problems with my hearing and tinnitus"    Mental Status Exam:  Appearance:   Casual     Behavior:  Appropriate, Sharing, and Motivated  Motor:  Normal  Speech/Language:   Clear and Coherent  Affect:  Anxious, depressed  Mood:  anxious and depressed  Thought process:  normal  Thought content:    WNL  Sensory/Perceptual disturbances:    WNL  Orientation:  oriented to person, place, time/date, situation, day of week, month of year, year, and stated date of Jul 08, 2023  Attention:  Good  Concentration:  Good  Memory:  WNL  Fund of knowledge:   Good  Insight:    Good  Judgment:   Good  Impulse Control:  Good   Risk Assessment: Danger to Self:  No Self-injurious Behavior: No Danger to Others: No Duty to Warn:no Physical Aggression / Violence:No  Access to Firearms a concern: No  Gang Involvement:No   Subjective:  Patinet in for appt today and working further on her anxiety and depression. States she saw Dr re: hearing aids and they were also able to "treat my Meniere's" with meds currently at 3 times daily. Still not hearing from older daughter after having a challenging interaction with her previously and daughter pulled away. Patient mindful of boundaries and has given daughter "space". Needing to talk further today and work on family relationships with both daughters and husband. Hurt by one of the daughters especially and trying to work through her anxiety about relationship with this daughter. With her distancing herself from patient, patient feels she is not able to reach out to that daughter. Does find support through her church and her faith.   Interventions: Cognitive Behavioral Therapy, Solution-Oriented/Positive Psychology, and Ego-Supportive Long term  goal: Develop healthy cognitive patterns and beliefs about self and the world that lead to alleviation of depression and anxiety, and help prevent relapse of depression and anxiety. Short term goal: Learn and implement personal skills for managing stress, solving daily problems, and resolving conflicts effectively.  Strategies: Use modeling and role-playing to help recognize anxious/depressive/negative thought patterns that create anxious/depressive/negative feelings and actions, interrupt them and replace with more positive reality-based thoughts that do not support depression.    Diagnosis:   ICD-10-CM   1. Generalized anxiety disorder  F41.1      Plan: Patient today participating openly in session as she worked further on significant interpersonal issues within her family relationships, her depression which has improved some, her anxiety which is increased some, and her experience of being criticized further by her oldest daughter.  Husband remains supportive "usually", and patient also has some close friends especially within her church family that are supportive. Encouraged patient in practicing more positive and self-affirming behaviors as noted in sessions including: Remaining in the present focusing on what she can control or change, believing more in herself that she can make and keep significant positive changes that will help her feel better about herself and her future, interrupt anxious/depressed thoughts and challenge them to replace with more realistic thoughts, healthy boundaries within the family and beyond, contact with supportive people, staying connected within her church family which is very supportive of her and her husband, allow her faith to be a healing resource for her emotionally as well as spiritually,  use of positive self talk, and recognize the strength she shows when working with goal-directed behaviors to move in a direction that supports her improved emotional health and  overall wellbeing.  [name] continues to work on her goals as she continues to focus on goal-directed behaviors to help her move in a healthier and more hopeful direction.  Goal review and progress/challenges noted with patient.  Next appt within 3 weeks.   Kelleen Patee, LCSW

## 2023-07-11 ENCOUNTER — Other Ambulatory Visit: Payer: Self-pay

## 2023-07-11 DIAGNOSIS — F411 Generalized anxiety disorder: Secondary | ICD-10-CM

## 2023-07-11 DIAGNOSIS — F330 Major depressive disorder, recurrent, mild: Secondary | ICD-10-CM

## 2023-07-11 MED ORDER — DULOXETINE HCL 60 MG PO CPEP: 60.0000 mg | ORAL_CAPSULE | Freq: Every day | ORAL | 0 refills | Status: DC

## 2023-07-22 DIAGNOSIS — R0789 Other chest pain: Secondary | ICD-10-CM | POA: Diagnosis not present

## 2023-07-22 DIAGNOSIS — K2100 Gastro-esophageal reflux disease with esophagitis, without bleeding: Secondary | ICD-10-CM | POA: Diagnosis not present

## 2023-07-22 DIAGNOSIS — K449 Diaphragmatic hernia without obstruction or gangrene: Secondary | ICD-10-CM | POA: Diagnosis not present

## 2023-07-23 DIAGNOSIS — H00012 Hordeolum externum right lower eyelid: Secondary | ICD-10-CM | POA: Diagnosis not present

## 2023-07-23 DIAGNOSIS — H0288A Meibomian gland dysfunction right eye, upper and lower eyelids: Secondary | ICD-10-CM | POA: Diagnosis not present

## 2023-07-23 DIAGNOSIS — H0102B Squamous blepharitis left eye, upper and lower eyelids: Secondary | ICD-10-CM | POA: Diagnosis not present

## 2023-07-23 DIAGNOSIS — H16223 Keratoconjunctivitis sicca, not specified as Sjogren's, bilateral: Secondary | ICD-10-CM | POA: Diagnosis not present

## 2023-07-23 DIAGNOSIS — H0288B Meibomian gland dysfunction left eye, upper and lower eyelids: Secondary | ICD-10-CM | POA: Diagnosis not present

## 2023-07-23 DIAGNOSIS — H0102A Squamous blepharitis right eye, upper and lower eyelids: Secondary | ICD-10-CM | POA: Diagnosis not present

## 2023-07-23 DIAGNOSIS — H0279 Other degenerative disorders of eyelid and periocular area: Secondary | ICD-10-CM | POA: Diagnosis not present

## 2023-07-29 ENCOUNTER — Ambulatory Visit: Admitting: Psychiatry

## 2023-07-29 DIAGNOSIS — F411 Generalized anxiety disorder: Secondary | ICD-10-CM

## 2023-07-29 NOTE — Progress Notes (Signed)
 [facility] Counselor/Therapist Progress Note  Patient ID: [name], MRN: [id number],    Date: 07/29/2023  Time Spent: 50 minutes   Treatment Type: Individual Therapy  Reported Symptoms:  anxiety, depression   Mental Status Exam:  Appearance:   Casual     Behavior:  Appropriate, Sharing, and Motivated  Motor:  Normal  Speech/Language:   Clear and Coherent  Affect:  Depressed and anxiety  Mood:  anxious and some depression   Thought process:  goal directed  Thought content:    Rumination  Sensory/Perceptual disturbances:    WNL  Orientation:  oriented to person, place, time/date, situation, day of week, month of year, year, and stated date of Jul 29, 2023  Attention:  Good  Concentration:  Good and Fair  Memory:  Some forgetting at times  Progress Energy of knowledge:   Good  Insight:    Good  Judgment:   Good  Impulse Control:  Good   Risk Assessment: Danger to Self:  No Self-injurious Behavior: No Danger to Others: No Duty to Warn:no Physical Aggression / Violence:No  Access to Firearms a concern: No  Gang Involvement:No   Subjective:   Patient today in session and working further on her anxiety, depression, and some issues within her marriage.  Discussed more about the situation with her oldest daughter who has been a little more open more recently versus being closed off to patient and others, but does continue to set some boundaries which limits some access to the grandchild involved..  Patient senses some distance still in relationship and does not want to push that currently.  Frustrated with some adjustments on her hearing aids but thinks they are getting better at this point.  Knows from past experience that she needs to be mindful of boundaries and definitely continues to give the older daughter space as needed.  Really appreciate some of her friends especially ones that she has made through her church where she finds a lot of support in her faith.  Interventions:  Cognitive Behavioral Therapy, Solution-Oriented/Positive Psychology, and Ego-Supportive Long term goal: Develop healthy cognitive patterns and beliefs about self and the world that lead to alleviation of depression and anxiety, and help prevent relapse of depression and anxiety. Short term goal: Learn and implement personal skills for managing stress, solving daily problems, and resolving conflicts effectively.  Strategies: Use modeling and role-playing to help recognize anxious/depressive/negative thought patterns that create anxious/depressive/negative feelings and actions, interrupt them and replace with more positive reality-based thoughts that do not support depression.     Diagnosis:   ICD-10-CM   1. Generalized anxiety disorder  F41.1      Plan:   Patient participating very actively in session today as she focused more on her anxiety, depression, family relationship issues, as well as some issues within her marriage.  Some response recently from oldest daughter who had distanced herself, however patient is not pushing this and letting daughter act on her own behalf whenever she might be ready for more closer relationship.  Overall patient's depression has improved some and is enjoying the warmer weather looking forward to be outside more.  Anxious/depressed thoughts have decreased some and patient is also showing progress and confronting/replacing some of those thoughts.  Reminded and encouraged patient in her practice of more positive and self affirming behaviors as noted in sessions including: Staying in the present and focusing on what she can control or change, believing more in herself, healthy boundaries, interrupt anxious/depressive thoughts  and challenge them to replace with more realistic thoughts, contact with supportive people, remaining connected within her church family which is very supportive of her and husband, allow her faith to be a healing resource for her emotionally as well  as spiritually, use of positive self talk, and recognize the strengths she shows when working with goal-directed behaviors to move in a direction that supports her improved emotional health and her outlook into the future. Carrell Palmatier showing good motivation and continues to work on her goals focusing on the goal-directed behaviors that help her to move in a healthier and more hopeful direction.  Goal review and progress/challenges noted with patient.  Next appointment within 3 weeks.   Kelleen Patee, LCSW

## 2023-07-30 ENCOUNTER — Ambulatory Visit: Admitting: Physician Assistant

## 2023-08-06 DIAGNOSIS — G5701 Lesion of sciatic nerve, right lower limb: Secondary | ICD-10-CM | POA: Diagnosis not present

## 2023-08-06 DIAGNOSIS — M4726 Other spondylosis with radiculopathy, lumbar region: Secondary | ICD-10-CM | POA: Diagnosis not present

## 2023-08-06 DIAGNOSIS — M48061 Spinal stenosis, lumbar region without neurogenic claudication: Secondary | ICD-10-CM | POA: Diagnosis not present

## 2023-08-06 DIAGNOSIS — M7918 Myalgia, other site: Secondary | ICD-10-CM | POA: Diagnosis not present

## 2023-08-06 DIAGNOSIS — M25552 Pain in left hip: Secondary | ICD-10-CM | POA: Diagnosis not present

## 2023-08-06 DIAGNOSIS — G8929 Other chronic pain: Secondary | ICD-10-CM | POA: Diagnosis not present

## 2023-08-06 DIAGNOSIS — M1612 Unilateral primary osteoarthritis, left hip: Secondary | ICD-10-CM | POA: Diagnosis not present

## 2023-08-09 DIAGNOSIS — M9902 Segmental and somatic dysfunction of thoracic region: Secondary | ICD-10-CM | POA: Diagnosis not present

## 2023-08-09 DIAGNOSIS — M9903 Segmental and somatic dysfunction of lumbar region: Secondary | ICD-10-CM | POA: Diagnosis not present

## 2023-08-09 DIAGNOSIS — M62830 Muscle spasm of back: Secondary | ICD-10-CM | POA: Diagnosis not present

## 2023-08-09 DIAGNOSIS — M48061 Spinal stenosis, lumbar region without neurogenic claudication: Secondary | ICD-10-CM | POA: Diagnosis not present

## 2023-08-09 DIAGNOSIS — M5382 Other specified dorsopathies, cervical region: Secondary | ICD-10-CM | POA: Diagnosis not present

## 2023-08-09 DIAGNOSIS — M9901 Segmental and somatic dysfunction of cervical region: Secondary | ICD-10-CM | POA: Diagnosis not present

## 2023-08-11 DIAGNOSIS — M5382 Other specified dorsopathies, cervical region: Secondary | ICD-10-CM | POA: Diagnosis not present

## 2023-08-11 DIAGNOSIS — M9902 Segmental and somatic dysfunction of thoracic region: Secondary | ICD-10-CM | POA: Diagnosis not present

## 2023-08-11 DIAGNOSIS — M9901 Segmental and somatic dysfunction of cervical region: Secondary | ICD-10-CM | POA: Diagnosis not present

## 2023-08-11 DIAGNOSIS — M9903 Segmental and somatic dysfunction of lumbar region: Secondary | ICD-10-CM | POA: Diagnosis not present

## 2023-08-11 DIAGNOSIS — M48061 Spinal stenosis, lumbar region without neurogenic claudication: Secondary | ICD-10-CM | POA: Diagnosis not present

## 2023-08-11 DIAGNOSIS — M62830 Muscle spasm of back: Secondary | ICD-10-CM | POA: Diagnosis not present

## 2023-08-12 DIAGNOSIS — M9902 Segmental and somatic dysfunction of thoracic region: Secondary | ICD-10-CM | POA: Diagnosis not present

## 2023-08-12 DIAGNOSIS — M62830 Muscle spasm of back: Secondary | ICD-10-CM | POA: Diagnosis not present

## 2023-08-12 DIAGNOSIS — M9901 Segmental and somatic dysfunction of cervical region: Secondary | ICD-10-CM | POA: Diagnosis not present

## 2023-08-12 DIAGNOSIS — M48061 Spinal stenosis, lumbar region without neurogenic claudication: Secondary | ICD-10-CM | POA: Diagnosis not present

## 2023-08-12 DIAGNOSIS — M9903 Segmental and somatic dysfunction of lumbar region: Secondary | ICD-10-CM | POA: Diagnosis not present

## 2023-08-12 DIAGNOSIS — M5382 Other specified dorsopathies, cervical region: Secondary | ICD-10-CM | POA: Diagnosis not present

## 2023-08-16 DIAGNOSIS — M9901 Segmental and somatic dysfunction of cervical region: Secondary | ICD-10-CM | POA: Diagnosis not present

## 2023-08-16 DIAGNOSIS — M9903 Segmental and somatic dysfunction of lumbar region: Secondary | ICD-10-CM | POA: Diagnosis not present

## 2023-08-16 DIAGNOSIS — M62830 Muscle spasm of back: Secondary | ICD-10-CM | POA: Diagnosis not present

## 2023-08-16 DIAGNOSIS — M5382 Other specified dorsopathies, cervical region: Secondary | ICD-10-CM | POA: Diagnosis not present

## 2023-08-16 DIAGNOSIS — M48061 Spinal stenosis, lumbar region without neurogenic claudication: Secondary | ICD-10-CM | POA: Diagnosis not present

## 2023-08-16 DIAGNOSIS — M9902 Segmental and somatic dysfunction of thoracic region: Secondary | ICD-10-CM | POA: Diagnosis not present

## 2023-08-18 ENCOUNTER — Ambulatory Visit: Admitting: Psychiatry

## 2023-08-18 DIAGNOSIS — F411 Generalized anxiety disorder: Secondary | ICD-10-CM

## 2023-08-18 NOTE — Addendum Note (Signed)
 Addended by: Greco Gastelum on: 08/18/2023 02:48 PM   Modules accepted: Level of Service

## 2023-08-18 NOTE — Progress Notes (Addendum)
 [facility] Counselor/Therapist Progress Note  Patient ID: [name], MRN: [id number],    Date: 08/18/2023  Time Spent: 50 minutes  Treatment Type: Individual Therapy  Reported Symptoms: anxiety, depression   Mental Status Exam:  Appearance:   Casual and Neat     Behavior:  Appropriate, Sharing, and Motivated  Motor:  Normal  Speech/Language:   Clear and Coherent  Affect:  Depressed and anxiety  Mood:  anxious and depressed  Thought process:  goal directed  Thought content:    Some obsessive thoughts re: current stress  Sensory/Perceptual disturbances:    WNL  Orientation:  oriented to person, place, time/date, situation, day of week, month of year, year, and stated date of June18, 2025  Attention:  Good  Concentration:  Good  Memory:  Some memory issues but not bad  Fund of knowledge:   Good  Insight:    Good  Judgment:   Good  Impulse Control:  Good   Risk Assessment: Danger to Self:  No Self-injurious Behavior: No Danger to Others: No Duty to Warn:no Physical Aggression / Violence:No  Access to Firearms a concern: No  Gang Involvement:No   Subjective: Patient today reports improvement, feeling better, able to do more physically, but do have multiple situations in her life and family that are stressful for her. Patient showing good awareness and shared in session today her anxieties about the world and fears related to war and world violence. Talking through this more today at length which seemed helpful to patient in sorting through her feelings and working on some strategies for managing thoughts/feelings about world violence and conflict. Therapist encouraged patient to limit her exposure in watching world violence and she agrees.  Also worked in session today some on the relationship with one of her daughters which continues to be problematic.  Patient remains open to contact with daughter but trying not to push it right now.  Continuing to be mindful of  boundaries and relationships and giving other people space as appropriate.  Concerns at her church where they have been for years as it is going through some changes but so far nothing drastic.  Enjoys several friendships there and is concerned due to their decreased attendance if things might change.  Has been with a church several years and has enjoyed the small atmosphere and closeness in relationships.  Did seem more light in her mood by the end of session as she was able to vent and process a lot of concerns that she has had both regarding her church and also some within her family and as noted above issues with world violence.  Interventions: Cognitive Behavioral Therapy Long term goal: Develop healthy cognitive patterns and beliefs about self and the world that lead to alleviation of depression and anxiety, and help prevent relapse of depression and anxiety. Short term goal: Learn and implement personal skills for managing stress, solving daily problems, and resolving conflicts effectively.  Strategies: Use modeling and role-playing to help recognize anxious/depressive/negative thought patterns that create anxious/depressive/negative feelings and actions, interrupt them and replace with more positive reality-based thoughts that do not support depression.    Diagnosis:   ICD-10-CM   1. Generalized anxiety disorder  F41.1      Plan:   Patient showing active involvement in session today continuing to work on her anxiety, depression, family issues, as well as some issues that she and husband are working on improving in their relationship.  Patient reports some of her  anxious/depressed thoughts have decreased and she does feel that she is still making progress particularly in trying to improve her thought patterns. Encouraged patient in her practice of more positive and self affirming behaviors as noted in sessions including: Remaining in the present and focusing on what she can control or  change, believing more in herself, healthy boundaries, interrupt anxious/depressive thoughts and challenge them to replace with more realistic thoughts, contact with supportive people, remaining connected within her church family which is very supportive of her and her husband, allow her faith to be a healing resource for her emotionally as well as spiritually, use of positive self talk, and recognize the strengths she shows when working with goal-directed behaviors, moving in a direction that supports her improved emotional health and outlook into the future.  Jermisha Hoffart shows good motivation and good focus in working on her goal-directed behaviors to help her move in a healthier and more hopeful direction into the future.  Goal review and progress/challenges noted with patient.  Next appointment within  3 weeks.  Kelleen Patee, LCSW

## 2023-08-19 DIAGNOSIS — Z961 Presence of intraocular lens: Secondary | ICD-10-CM | POA: Diagnosis not present

## 2023-08-19 DIAGNOSIS — M9903 Segmental and somatic dysfunction of lumbar region: Secondary | ICD-10-CM | POA: Diagnosis not present

## 2023-08-19 DIAGNOSIS — H53143 Visual discomfort, bilateral: Secondary | ICD-10-CM | POA: Diagnosis not present

## 2023-08-19 DIAGNOSIS — Z9849 Cataract extraction status, unspecified eye: Secondary | ICD-10-CM | POA: Diagnosis not present

## 2023-08-19 DIAGNOSIS — M5382 Other specified dorsopathies, cervical region: Secondary | ICD-10-CM | POA: Diagnosis not present

## 2023-08-19 DIAGNOSIS — M62830 Muscle spasm of back: Secondary | ICD-10-CM | POA: Diagnosis not present

## 2023-08-19 DIAGNOSIS — M9902 Segmental and somatic dysfunction of thoracic region: Secondary | ICD-10-CM | POA: Diagnosis not present

## 2023-08-19 DIAGNOSIS — M9901 Segmental and somatic dysfunction of cervical region: Secondary | ICD-10-CM | POA: Diagnosis not present

## 2023-08-19 DIAGNOSIS — M48061 Spinal stenosis, lumbar region without neurogenic claudication: Secondary | ICD-10-CM | POA: Diagnosis not present

## 2023-08-20 DIAGNOSIS — M5382 Other specified dorsopathies, cervical region: Secondary | ICD-10-CM | POA: Diagnosis not present

## 2023-08-20 DIAGNOSIS — M9903 Segmental and somatic dysfunction of lumbar region: Secondary | ICD-10-CM | POA: Diagnosis not present

## 2023-08-20 DIAGNOSIS — M62830 Muscle spasm of back: Secondary | ICD-10-CM | POA: Diagnosis not present

## 2023-08-20 DIAGNOSIS — M9901 Segmental and somatic dysfunction of cervical region: Secondary | ICD-10-CM | POA: Diagnosis not present

## 2023-08-20 DIAGNOSIS — M9902 Segmental and somatic dysfunction of thoracic region: Secondary | ICD-10-CM | POA: Diagnosis not present

## 2023-08-20 DIAGNOSIS — M48061 Spinal stenosis, lumbar region without neurogenic claudication: Secondary | ICD-10-CM | POA: Diagnosis not present

## 2023-08-23 DIAGNOSIS — Z974 Presence of external hearing-aid: Secondary | ICD-10-CM | POA: Diagnosis not present

## 2023-08-23 DIAGNOSIS — Z882 Allergy status to sulfonamides status: Secondary | ICD-10-CM | POA: Diagnosis not present

## 2023-08-23 DIAGNOSIS — Z885 Allergy status to narcotic agent status: Secondary | ICD-10-CM | POA: Diagnosis not present

## 2023-08-23 DIAGNOSIS — R252 Cramp and spasm: Secondary | ICD-10-CM | POA: Diagnosis not present

## 2023-08-23 DIAGNOSIS — Z7982 Long term (current) use of aspirin: Secondary | ICD-10-CM | POA: Diagnosis not present

## 2023-08-23 DIAGNOSIS — Z886 Allergy status to analgesic agent status: Secondary | ICD-10-CM | POA: Diagnosis not present

## 2023-08-23 DIAGNOSIS — Z0110 Encounter for examination of ears and hearing without abnormal findings: Secondary | ICD-10-CM | POA: Diagnosis not present

## 2023-08-23 DIAGNOSIS — M9901 Segmental and somatic dysfunction of cervical region: Secondary | ICD-10-CM | POA: Diagnosis not present

## 2023-08-23 DIAGNOSIS — H9313 Tinnitus, bilateral: Secondary | ICD-10-CM | POA: Diagnosis not present

## 2023-08-23 DIAGNOSIS — M9903 Segmental and somatic dysfunction of lumbar region: Secondary | ICD-10-CM | POA: Diagnosis not present

## 2023-08-23 DIAGNOSIS — M48061 Spinal stenosis, lumbar region without neurogenic claudication: Secondary | ICD-10-CM | POA: Diagnosis not present

## 2023-08-23 DIAGNOSIS — M62830 Muscle spasm of back: Secondary | ICD-10-CM | POA: Diagnosis not present

## 2023-08-23 DIAGNOSIS — Z981 Arthrodesis status: Secondary | ICD-10-CM | POA: Diagnosis not present

## 2023-08-23 DIAGNOSIS — H903 Sensorineural hearing loss, bilateral: Secondary | ICD-10-CM | POA: Diagnosis not present

## 2023-08-23 DIAGNOSIS — M5382 Other specified dorsopathies, cervical region: Secondary | ICD-10-CM | POA: Diagnosis not present

## 2023-08-23 DIAGNOSIS — Z79899 Other long term (current) drug therapy: Secondary | ICD-10-CM | POA: Diagnosis not present

## 2023-08-23 DIAGNOSIS — M9902 Segmental and somatic dysfunction of thoracic region: Secondary | ICD-10-CM | POA: Diagnosis not present

## 2023-08-23 DIAGNOSIS — H918X3 Other specified hearing loss, bilateral: Secondary | ICD-10-CM | POA: Diagnosis not present

## 2023-08-24 DIAGNOSIS — M9903 Segmental and somatic dysfunction of lumbar region: Secondary | ICD-10-CM | POA: Diagnosis not present

## 2023-08-24 DIAGNOSIS — M5382 Other specified dorsopathies, cervical region: Secondary | ICD-10-CM | POA: Diagnosis not present

## 2023-08-24 DIAGNOSIS — M9902 Segmental and somatic dysfunction of thoracic region: Secondary | ICD-10-CM | POA: Diagnosis not present

## 2023-08-24 DIAGNOSIS — M48061 Spinal stenosis, lumbar region without neurogenic claudication: Secondary | ICD-10-CM | POA: Diagnosis not present

## 2023-08-24 DIAGNOSIS — M62830 Muscle spasm of back: Secondary | ICD-10-CM | POA: Diagnosis not present

## 2023-08-24 DIAGNOSIS — M9901 Segmental and somatic dysfunction of cervical region: Secondary | ICD-10-CM | POA: Diagnosis not present

## 2023-08-25 ENCOUNTER — Encounter: Payer: Self-pay | Admitting: Physician Assistant

## 2023-08-25 ENCOUNTER — Ambulatory Visit (INDEPENDENT_AMBULATORY_CARE_PROVIDER_SITE_OTHER): Admitting: Physician Assistant

## 2023-08-25 DIAGNOSIS — F411 Generalized anxiety disorder: Secondary | ICD-10-CM | POA: Diagnosis not present

## 2023-08-25 DIAGNOSIS — F330 Major depressive disorder, recurrent, mild: Secondary | ICD-10-CM

## 2023-08-25 DIAGNOSIS — F5101 Primary insomnia: Secondary | ICD-10-CM

## 2023-08-25 MED ORDER — LORAZEPAM 0.5 MG PO TABS: 0.2500 mg | ORAL_TABLET | Freq: Two times a day (BID) | ORAL | 5 refills | Status: DC | PRN

## 2023-08-25 MED ORDER — DULOXETINE HCL 60 MG PO CPEP: 60.0000 mg | ORAL_CAPSULE | Freq: Every day | ORAL | 1 refills | Status: DC

## 2023-08-25 MED ORDER — TRAZODONE HCL 50 MG PO TABS: 50.0000 mg | ORAL_TABLET | Freq: Every day | ORAL | 1 refills | Status: DC

## 2023-08-25 NOTE — Progress Notes (Signed)
 [facility] Med Check  Patient ID: [name],  MRN: [id number]  PCP: Avva, Ravisankar, MD  Date of Evaluation: 08/25/2023 Time spent:20 minutes  Chief Complaint:  Chief Complaint   Anxiety; Depression; Insomnia; Follow-up    HISTORY/CURRENT STATUS: HPI For routine med check.  Overall she feels pretty good as far as her mental health goes.  Looking forward to ging to the beach in July. Energy and motivation are good.  No extreme sadness, tearfulness, or feelings of hopelessness.  Sleeps well most of the time. ADLs and personal hygiene are normal.   Denies any changes in concentration, making decisions, or remembering things.  Appetite has not changed.  Weight is stable. No mania or psychosis.  Denies suicidal or homicidal thoughts.  Has hip and low back pain, Starting chiro care for that.  Denies dizziness, syncope, seizures, numbness, tingling, tremor, tics, unsteady gait, slurred speech, confusion. Denies dystonia.  Individual Medical History/ Review of Systems: Changes? :Yes   dx with [facility], It is better.   Past Psychiatric Medication Trials: She reports that some medications helped for short periods of time and then were not as effective. Prozac- had episodic low sodium levels Paxil Celexa Lexapro Effexor  XR- Took in 2016 Pristiq- Took in 2015 and had episode of hyponatremia  Cymbalta - Took in 2017 and had episode of hyponatremia then. Unable to tolerate 90 mg.  Wellbutrin - Caused headaches Buspar  Rexulti- Was helpful for mood. Caused weight gain.  Lithium- Started 3 years ago during hospitalization Lamictal - caused some cognitive side effects Abilify - Took in 2016 Risperdal- Took in 2019. Has hyponatremia at that time.  Zyprexa- Took in May, 2019 Trileptal- Hyponatremia.  Carbamazepine- Caused severe hyponatremia.  Topamax -Cognitive side effects Gabapentin- unsure if this has been helpful. Reports taking long-term and reports that this was recently increased.  Has been somewhat helpful for RLS.  Hydroxyzine- Unable to recall response.  Trazodone - Excessive daytime somnolence Cytomel Deplin- ineffective Diazepam- Took for vertigo/possible vestibular migraines Ativan   Allergies: Codeine, Doxycycline , Ibuprofen, Nickel, Nsaids, and Sulfa antibiotics  Current Medications:  Current Outpatient Medications:    acetaminophen  (TYLENOL ) 500 MG tablet, Take 1,000 mg by mouth once as needed for mild pain or headache., Disp: , Rfl:    atenolol  (TENORMIN ) 25 MG tablet, TAKE ONE TABLET BY MOUTH DAILY, PLEASE MAKE APPOINTMENT WITH PROVIDER, THIS IS THE LAST REFILL UNTIL THEN, Disp: 28 tablet, Rfl: 0   baclofen  (LIORESAL ) 20 MG tablet, Take 20 mg by mouth in the morning and at bedtime., Disp: , Rfl:    Betahistine HCl POWD, 16 mg by Does not apply route 3 (three) times daily., Disp: , Rfl:    busPIRone  (BUSPAR ) 10 MG tablet, Take 1 tablet (10 mg total) by mouth 2 (two) times daily., Disp: 180 tablet, Rfl: 1   CEQUA 0.09 % SOLN, Apply 1 drop to eye 2 (two) times daily., Disp: , Rfl:    cyproheptadine  (PERIACTIN ) 4 MG tablet, Take 4 mg by mouth once., Disp: , Rfl:    denosumab  (PROLIA ) 60 MG/ML SOLN injection, Inject 60 mg into the skin every 6 (six) months. Administer in upper arm, thigh, or abdomen, Disp: , Rfl:    estradiol  (ESTRACE  VAGINAL) 0.1 MG/GM vaginal cream, Place 1 g vaginally 3 (three) times a week., Disp: 42.5 g, Rfl: 2   famotidine (PEPCID) 20 MG tablet, Take 20 mg by mouth daily., Disp: , Rfl:    ipratropium (ATROVENT ) 0.06 % nasal spray, Place 1 spray into both nostrils in the morning and at bedtime.,  Disp: , Rfl:    levothyroxine  (SYNTHROID ) 112 MCG tablet, Take 112 mcg by mouth daily before breakfast. One hour before meal., Disp: , Rfl:    LOTEMAX SM 0.38 % GEL, Apply 1 drop to eye 4 (four) times daily., Disp: , Rfl:    Magnesium 250 MG CAPS, Take by mouth 2 (two) times daily., Disp: , Rfl:    ondansetron  (ZOFRAN -ODT) 4 MG disintegrating tablet,  Take by mouth as needed., Disp: , Rfl:    pantoprazole  (PROTONIX ) 40 MG tablet, Take 40 mg by mouth 2 (two) times daily., Disp: , Rfl:    rosuvastatin  (CRESTOR ) 20 MG tablet, Take 20 mg by mouth daily., Disp: , Rfl:    triamterene-hydrochlorothiazide (DYAZIDE) 37.5-25 MG capsule, Take 1 capsule by mouth every morning., Disp: , Rfl:    triamterene-hydrochlorothiazide (DYAZIDE) 37.5-25 MG capsule, Take 1 capsule by mouth daily., Disp: , Rfl:    valACYclovir  (VALTREX ) 500 MG tablet, TAKE ONE TABLET BY MOUTH DAILY, Disp: 90 tablet, Rfl: 3   Vibegron (GEMTESA) 75 MG TABS, Take 75 mg by mouth daily., Disp: , Rfl:    Vitamin D, Ergocalciferol, (DRISDOL) 1.25 MG (50000 UNIT) CAPS capsule, Take 50,000 Units by mouth every 7 (seven) days., Disp: , Rfl:    DULoxetine  (CYMBALTA ) 60 MG capsule, Take 1 capsule (60 mg total) by mouth daily., Disp: 90 capsule, Rfl: 1   LORazepam  (ATIVAN ) 0.5 MG tablet, Take 0.5-1 tablets (0.25-0.5 mg total) by mouth 2 (two) times daily as needed for anxiety., Disp: 30 tablet, Rfl: 5   Polyvinyl Alcohol -Povidone (REFRESH OP), Apply to eye. (Patient not taking: Reported on 08/25/2023), Disp: , Rfl:    traZODone  (DESYREL ) 50 MG tablet, Take 1 tablet (50 mg total) by mouth at bedtime., Disp: 90 tablet, Rfl: 1 Medication Side Effects: none  Family Medical/ Social History: Changes? No  MENTAL HEALTH EXAM:  Last menstrual period 03/02/1988.There is no height or weight on file to calculate BMI.  General Appearance: Casual and Well Groomed  Eye Contact:  Good  Speech:  Clear and Coherent and Normal Rate  Volume:  Normal  Mood:  Euthymic  Affect:  Congruent  Thought Process:  Goal Directed and Descriptions of Associations: Circumstantial  Orientation:  Full (Time, Place, and Person)  Thought Content: Logical   Suicidal Thoughts:  No  Homicidal Thoughts:  No  Memory:  WNL  Judgement:  Good  Insight:  Good  Psychomotor Activity:  Normal  Concentration:  Concentration: Good and  Attention Span: Good  Recall:  Good  Fund of Knowledge: Good  Language: Good  Assets:  Desire for Improvement Financial Resources/Insurance Housing Talents/Skills Transportation  ADL's:  Intact  Cognition: WNL  Prognosis:  Good   DIAGNOSES:    ICD-10-CM   1. Generalized anxiety disorder  F41.1 DULoxetine  (CYMBALTA ) 60 MG capsule    LORazepam  (ATIVAN ) 0.5 MG tablet    2. Major depressive disorder, recurrent episode, mild (HCC)  F33.0 DULoxetine  (CYMBALTA ) 60 MG capsule    3. Primary insomnia  F51.01 LORazepam  (ATIVAN ) 0.5 MG tablet    traZODone  (DESYREL ) 50 MG tablet     Receiving Psychotherapy: Yes  with Marval Bunde, LCSW  RECOMMENDATIONS:   PDMP review.  Ativan  filled 08/18/2023. I provided 20 minutes of face to face time during this encounter, including time spent before and after the visit in records review, medical decision making, counseling pertinent to today's visit, and charting.   She's stable on current meds so no changes were made.   Continue Buspar   10 mg, 1 po bid. Continue Cymbalta  60 mg, 1 every day. Continue Ativan  0.5 mg, 1 po qd prn. Continue Trazodone  50 mg, 1 at bedtime.  Continue therapy with Marval Bunde, LCSW. Return in 3 months.   Verneita Cooks, PA-C

## 2023-08-26 DIAGNOSIS — M9901 Segmental and somatic dysfunction of cervical region: Secondary | ICD-10-CM | POA: Diagnosis not present

## 2023-08-26 DIAGNOSIS — M9902 Segmental and somatic dysfunction of thoracic region: Secondary | ICD-10-CM | POA: Diagnosis not present

## 2023-08-26 DIAGNOSIS — M5382 Other specified dorsopathies, cervical region: Secondary | ICD-10-CM | POA: Diagnosis not present

## 2023-08-26 DIAGNOSIS — M48061 Spinal stenosis, lumbar region without neurogenic claudication: Secondary | ICD-10-CM | POA: Diagnosis not present

## 2023-08-26 DIAGNOSIS — M9903 Segmental and somatic dysfunction of lumbar region: Secondary | ICD-10-CM | POA: Diagnosis not present

## 2023-08-26 DIAGNOSIS — M62830 Muscle spasm of back: Secondary | ICD-10-CM | POA: Diagnosis not present

## 2023-08-30 DIAGNOSIS — M9902 Segmental and somatic dysfunction of thoracic region: Secondary | ICD-10-CM | POA: Diagnosis not present

## 2023-08-30 DIAGNOSIS — M5382 Other specified dorsopathies, cervical region: Secondary | ICD-10-CM | POA: Diagnosis not present

## 2023-08-30 DIAGNOSIS — M9901 Segmental and somatic dysfunction of cervical region: Secondary | ICD-10-CM | POA: Diagnosis not present

## 2023-08-30 DIAGNOSIS — M62830 Muscle spasm of back: Secondary | ICD-10-CM | POA: Diagnosis not present

## 2023-08-30 DIAGNOSIS — M9903 Segmental and somatic dysfunction of lumbar region: Secondary | ICD-10-CM | POA: Diagnosis not present

## 2023-08-30 DIAGNOSIS — M48061 Spinal stenosis, lumbar region without neurogenic claudication: Secondary | ICD-10-CM | POA: Diagnosis not present

## 2023-08-31 DIAGNOSIS — M5416 Radiculopathy, lumbar region: Secondary | ICD-10-CM | POA: Diagnosis not present

## 2023-09-01 DIAGNOSIS — M9902 Segmental and somatic dysfunction of thoracic region: Secondary | ICD-10-CM | POA: Diagnosis not present

## 2023-09-01 DIAGNOSIS — H1045 Other chronic allergic conjunctivitis: Secondary | ICD-10-CM | POA: Diagnosis not present

## 2023-09-01 DIAGNOSIS — M48061 Spinal stenosis, lumbar region without neurogenic claudication: Secondary | ICD-10-CM | POA: Diagnosis not present

## 2023-09-01 DIAGNOSIS — M62830 Muscle spasm of back: Secondary | ICD-10-CM | POA: Diagnosis not present

## 2023-09-01 DIAGNOSIS — H04123 Dry eye syndrome of bilateral lacrimal glands: Secondary | ICD-10-CM | POA: Diagnosis not present

## 2023-09-01 DIAGNOSIS — M9901 Segmental and somatic dysfunction of cervical region: Secondary | ICD-10-CM | POA: Diagnosis not present

## 2023-09-01 DIAGNOSIS — M5382 Other specified dorsopathies, cervical region: Secondary | ICD-10-CM | POA: Diagnosis not present

## 2023-09-01 DIAGNOSIS — M9903 Segmental and somatic dysfunction of lumbar region: Secondary | ICD-10-CM | POA: Diagnosis not present

## 2023-09-02 DIAGNOSIS — M9903 Segmental and somatic dysfunction of lumbar region: Secondary | ICD-10-CM | POA: Diagnosis not present

## 2023-09-02 DIAGNOSIS — M9902 Segmental and somatic dysfunction of thoracic region: Secondary | ICD-10-CM | POA: Diagnosis not present

## 2023-09-02 DIAGNOSIS — M48061 Spinal stenosis, lumbar region without neurogenic claudication: Secondary | ICD-10-CM | POA: Diagnosis not present

## 2023-09-02 DIAGNOSIS — M5382 Other specified dorsopathies, cervical region: Secondary | ICD-10-CM | POA: Diagnosis not present

## 2023-09-02 DIAGNOSIS — M9901 Segmental and somatic dysfunction of cervical region: Secondary | ICD-10-CM | POA: Diagnosis not present

## 2023-09-02 DIAGNOSIS — M62830 Muscle spasm of back: Secondary | ICD-10-CM | POA: Diagnosis not present

## 2023-09-06 DIAGNOSIS — M5382 Other specified dorsopathies, cervical region: Secondary | ICD-10-CM | POA: Diagnosis not present

## 2023-09-06 DIAGNOSIS — M48061 Spinal stenosis, lumbar region without neurogenic claudication: Secondary | ICD-10-CM | POA: Diagnosis not present

## 2023-09-06 DIAGNOSIS — M9903 Segmental and somatic dysfunction of lumbar region: Secondary | ICD-10-CM | POA: Diagnosis not present

## 2023-09-06 DIAGNOSIS — M62830 Muscle spasm of back: Secondary | ICD-10-CM | POA: Diagnosis not present

## 2023-09-06 DIAGNOSIS — M9902 Segmental and somatic dysfunction of thoracic region: Secondary | ICD-10-CM | POA: Diagnosis not present

## 2023-09-06 DIAGNOSIS — M9901 Segmental and somatic dysfunction of cervical region: Secondary | ICD-10-CM | POA: Diagnosis not present

## 2023-09-08 ENCOUNTER — Ambulatory Visit: Admitting: Psychiatry

## 2023-09-08 DIAGNOSIS — F411 Generalized anxiety disorder: Secondary | ICD-10-CM

## 2023-09-08 NOTE — Progress Notes (Signed)
 [facility] Counselor/Therapist Progress Note  Patient ID: [name], MRN: [id number],    Date: 09/08/2023  Time Spent: 53 minutes   Treatment Type: Individual Therapy  Reported Symptoms:   Anxiety, depression    Mental Status Exam:  Appearance:   Neat     Behavior:  Appropriate, Sharing, and Motivated  Motor:  Normal  Speech/Language:   Clear and Coherent  Affect:  Depressed and anxiety  Mood:  anxious and depressed  Thought process:  goal directed  Thought content:    WNL  Sensory/Perceptual disturbances:    WNL  Orientation:  oriented to person, place, time/date, situation, day of week, month of year, year, and stated date of September 08, 2023  Attention:  Good  Concentration:  Good  Memory:  Some forgetting at times  Fund of knowledge:   Good  Insight:    Good  Judgment:   Good  Impulse Control:  Good   Risk Assessment: Danger to Self:  No Self-injurious Behavior: No Danger to Others: No Duty to Warn:no Physical Aggression / Violence:No  Access to Firearms a concern: No  Gang Involvement:No   Subjective:  Patient in session today showing good motivation and interaction as she worked further on her goals as noted below. Some annoyances with husband especially if I correct him or if I ask him to clarify something. Does share several challenges with husband re: habits that tend to annoy patient. Worries at times about my health and becoming worse when we move into another home. Also concerned about both of their daughters and patient's relationship with daughters.  Working more specifically on issues that relate to communication between her adult daughters and patient, and patient and her husband.  Continuing her efforts to be respectful and mindful of some family members boundaries and give them the space they need in relationships.  Trying to set better limits on her exposure to watching world violence on the news as this impacts her mood and negative ways  understandably.  Shared and processed some of her concerns about her church where they have been going for years, and hoping that certain changes will have to happen.  (Not all details included in this note due to patient privacy needs.) does seem to be looking forward to an upcoming week of vacation with her husband and another married couple from their church.  Continues to respect her older daughters boundaries of not pushing contact with her, but remains open if daughter decides she would like more direct contact with patient.  Interventions: Cognitive Behavioral Therapy, Solution-Oriented/Positive Psychology, and Ego-Supportive Long term goal: Develop healthy cognitive patterns and beliefs about self and the world that lead to alleviation of depression and anxiety, and help prevent relapse of depression and anxiety. Short term goal: Learn and implement personal skills for managing stress, solving daily problems, and resolving conflicts effectively.  Strategies: Use modeling and role-playing to help recognize anxious/depressive/negative thought patterns that create anxious/depressive/negative feelings and actions, interrupt them and replace with more positive reality-based thoughts that do not support depression.    Diagnosis:   ICD-10-CM   1. Generalized anxiety disorder  F41.1      Plan:  Patient today working more on her anxiety, some frustrations, and some depression a lot of which is connected to ongoing communication issues with her old adult daughter, which right now means patient is having very little contact other than she did female daughter a birthday gift.  Patient worked actively in session  today and does feel that some of the issues she and husband were having previously are improving at times.  Continues to make progress and better managing anxious/depressed thoughts. Reminded and encouraged patient in her practice of more positive and self affirming behaviors as discussed in  sessions including: Staying in the present and focused on what she can control or change, believing more in herself, healthy boundaries, interrupt anxious/depressive thoughts and challenge them to replace with more realistic thoughts, contact with supportive people, remaining connected within her church family which is very supportive of patient and her husband, allow her faith to be a healing resource for her emotionally as well as spiritually, use of positive self talk, and recognize the strength she shows when working with goal-directed behaviors, moving in a direction that supports her improved emotional health and outlook into the future.  Idelia Caudell continues to show good motivation and focus and working on her goal-directed behaviors helping her move in a healthier and more hopeful direction now and into the future.  Goal review and progress/challenges noted with patient.  Next appointment within 3 weeks.   Barnie Bunde, LCSW

## 2023-09-13 ENCOUNTER — Institutional Professional Consult (permissible substitution): Admitting: Neurology

## 2023-09-13 DIAGNOSIS — M79641 Pain in right hand: Secondary | ICD-10-CM | POA: Diagnosis not present

## 2023-09-13 DIAGNOSIS — G5622 Lesion of ulnar nerve, left upper limb: Secondary | ICD-10-CM | POA: Diagnosis not present

## 2023-09-13 DIAGNOSIS — R252 Cramp and spasm: Secondary | ICD-10-CM | POA: Diagnosis not present

## 2023-09-24 DIAGNOSIS — R42 Dizziness and giddiness: Secondary | ICD-10-CM | POA: Diagnosis not present

## 2023-09-29 ENCOUNTER — Ambulatory Visit: Admitting: Psychiatry

## 2023-10-04 ENCOUNTER — Ambulatory Visit: Admitting: Psychiatry

## 2023-10-06 DIAGNOSIS — L282 Other prurigo: Secondary | ICD-10-CM | POA: Diagnosis not present

## 2023-10-06 DIAGNOSIS — R220 Localized swelling, mass and lump, head: Secondary | ICD-10-CM | POA: Diagnosis not present

## 2023-10-06 DIAGNOSIS — F0390 Unspecified dementia without behavioral disturbance: Secondary | ICD-10-CM | POA: Diagnosis not present

## 2023-10-06 DIAGNOSIS — T7840XA Allergy, unspecified, initial encounter: Secondary | ICD-10-CM | POA: Diagnosis not present

## 2023-10-07 DIAGNOSIS — Z471 Aftercare following joint replacement surgery: Secondary | ICD-10-CM | POA: Diagnosis not present

## 2023-10-07 DIAGNOSIS — M25552 Pain in left hip: Secondary | ICD-10-CM | POA: Diagnosis not present

## 2023-10-07 DIAGNOSIS — M7062 Trochanteric bursitis, left hip: Secondary | ICD-10-CM | POA: Diagnosis not present

## 2023-10-07 DIAGNOSIS — Z96652 Presence of left artificial knee joint: Secondary | ICD-10-CM | POA: Diagnosis not present

## 2023-10-08 ENCOUNTER — Other Ambulatory Visit: Payer: Self-pay | Admitting: Medical Genetics

## 2023-10-11 DIAGNOSIS — L718 Other rosacea: Secondary | ICD-10-CM | POA: Diagnosis not present

## 2023-10-11 DIAGNOSIS — L239 Allergic contact dermatitis, unspecified cause: Secondary | ICD-10-CM | POA: Diagnosis not present

## 2023-10-20 ENCOUNTER — Ambulatory Visit: Admitting: Psychiatry

## 2023-10-20 DIAGNOSIS — F411 Generalized anxiety disorder: Secondary | ICD-10-CM | POA: Diagnosis not present

## 2023-10-20 NOTE — Progress Notes (Signed)
 [facility] Counselor/Therapist Progress Note  Patient ID: [name], MRN: [id number],    Date: 10/20/2023  Time Spent:  53 minute  Treatment Type: Individual Therapy  Reported Symptoms:  anxiety (main symptom) , depression   Mental Status Exam:  Appearance:   Casual and Neat     Behavior:  Appropriate, Sharing, and Motivated  Motor:  Normal  Speech/Language:   Clear and Coherent  Affect:  Anxious, depressed  Mood:  anxious and depressed  Thought process:  goal directed  Thought content:    WNL  Sensory/Perceptual disturbances:    WNL  Orientation:  oriented to person, place, time/date, situation, day of week, month of year, year, and stated date of Aug. 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 Needed session today to express and work through some of her sadness, and working to be less negative and not so easily hurt by family members, per patient report. Sharing issues that have developed since her last appt. Issues related to her church in which she is very involved and patient discussed these in detail today as she is concerned and needed the time today to share and discuss as these are issues that mean a lot to her and impact her mood.  Does worry some about the future about her and her husband's health possibly getting worse but for now they are not experiencing change.  Also paying attention to not absorbing too much of the news on  TV about world violence and the political scene.  Admitted that she has watched some but is definitely decreasing it.  Has had issues with both of her adult daughters, primarily the older 1 who is now not speaking with her again.  Patient herself trying to have healthy boundaries and not be too reactionary to that daughter and give her some space. (Not all details included in this note due to patient privacy needs).    Interventions: Cognitive Behavioral Therapy, Solution-Oriented/Positive Psychology, and Ego-Supportive Long term goal: Develop healthy cognitive patterns and beliefs about self and the world that lead to alleviation of depression and anxiety, and help prevent relapse of depression and anxiety. Short term goal: Learn and implement personal skills for managing stress, solving daily problems, and resolving conflicts effectively.  Strategies: Use modeling and role-playing to help recognize anxious/depressive/negative thought patterns that create anxious/depressive/negative feelings and actions, interrupt them and replace with more positive reality-based thoughts that do not support depression.   Diagnosis:   ICD-10-CM   1. Generalized anxiety disorder  F41.1      Plan: Patient today working further on her depression, frustrations, anxiety, and some family issues which is also related to some ongoing communication issues with her adult daughter and some now with the younger daughter, for different reasons.  Younger daughter seems to be more adaptive and wanting to be in relationship with patient.  Patient working in  session today very well as she focused on some of the family issues and also how this impacts her depression and anxiety.  She does continue to show progress and especially in trying to better manage her depressed/anxious thoughts. Patient reminded to be practicing more of her positive and self affirming behaviors as discussed in sessions including: Stay in the present and  focused on what she can control or change, believing more in herself, healthy boundaries, interrupt anxious/depressive thoughts and challenge them to replace with more realistic thoughts, contact with supportive people, remain connected within her church family which is very supportive of her and her husband, allow her faith to be a healing resource for her emotionally as well as spiritually, use of positive self talk, and realize the strength she shows when working with goal-directed behaviors trying to move in a direction that supports her improved emotional health and her outlook into the future.  [name] is showing good motivation and focus while working on her goal-directed behaviors, making progress, and moving herself in a healthier and more hopeful direction into the future.  Goal review and progress/challenges noted with patient.  Next appointment within 3 weeks.   Barnie Bunde, LCSW

## 2023-11-03 ENCOUNTER — Other Ambulatory Visit (HOSPITAL_COMMUNITY)
Admission: RE | Admit: 2023-11-03 | Discharge: 2023-11-03 | Disposition: A | Discharge status: Home / Self Care | Payer: Self-pay | Source: Ambulatory Visit | Attending: Medical Genetics | Admitting: Medical Genetics

## 2023-11-05 DIAGNOSIS — M5416 Radiculopathy, lumbar region: Secondary | ICD-10-CM | POA: Diagnosis not present

## 2023-11-10 ENCOUNTER — Ambulatory Visit: Admitting: Psychiatry

## 2023-11-12 LAB — GENECONNECT MOLECULAR SCREEN: Genetic Analysis Overall Interpretation: NEGATIVE

## 2023-11-17 ENCOUNTER — Ambulatory Visit: Admitting: Psychiatry

## 2023-11-17 DIAGNOSIS — F411 Generalized anxiety disorder: Secondary | ICD-10-CM

## 2023-11-17 DIAGNOSIS — Z1231 Encounter for screening mammogram for malignant neoplasm of breast: Secondary | ICD-10-CM | POA: Diagnosis not present

## 2023-11-17 LAB — HM MAMMOGRAPHY

## 2023-11-17 NOTE — Progress Notes (Signed)
 [facility] Counselor/Therapist Progress Note  Patient ID: [name], MRN: [id number],    Date: 11/17/2023  Time Spent: 48 minutes   Treatment Type: Individual Therapy  Reported Symptoms:  Anxiety, depression not quite as bad    Mental Status Exam:  Appearance:   Casual and Neat     Behavior:  Appropriate, Sharing, and Motivated  Motor:  Normal  Speech/Language:   Clear and Coherent  Affect:  Depressed  Mood:  Anxious, some depression  Thought process:  goal directed  Thought content:    WNL  Sensory/Perceptual disturbances:    WNL  Orientation:  oriented to person, place, time/date, situation, day of week, month of year, year, and stated date of Sept. 17, 2025  Attention:  Good  Concentration:  Good  Memory:  WNL  Fund of knowledge:   Good  Insight:    Good  Judgment:   Good  Impulse Control:  Good   Risk Assessment: Danger to Self:  No Self-injurious Behavior: No Danger to Others: No Duty to Warn:no Physical Aggression / Violence:No  Access to Firearms a concern: No  Gang Involvement:No   Subjective:   Patient today in session and reporting anxiety and some depression although better. Recent trip to beach with a couple friends. Some stress in that environment more related to her husband's increased mental/emotional issues. Patient shared multiple examples of stress within her and her husband's patient's that have led to more friction between the two of them.  Does feel like this was a more isolated experience recently and during a time of high stress within their relationship.  Explained more of her concerns in session today, and also able to see certain things and a new light that can offer some hopefulness for their moving forward.  (Not all details included in this note due to patient privacy needs.) No further issues with oldest daughter and husband more recently.  Still feeling the strain in certain family relationships.  Patient feeling less sadness and  less negativity, but still some sensitivity to relationship issues.  Trying not to worry about the future.  Continue to encourage her to limit the watching of violence and stressful political programming on TV or other devices.  Working further on healthy boundaries and trying not to make assumptions especially when there is uncertainty involved.    Interventions: Cognitive Behavioral Therapy, Solution-Oriented/Positive Psychology, and Ego-Supportive Long term goal: Develop healthy cognitive patterns and beliefs about self and the world that lead to alleviation of depression and anxiety, and help prevent relapse of depression and anxiety. Short term goal: Learn and implement personal skills for managing stress, solving daily problems, and resolving conflicts effectively.  Strategies: Use modeling and role-playing to help recognize anxious/depressive/negative thought patterns that create anxious/depressive/negative feelings and actions, interrupt them and replace with more positive reality-based thoughts that do not support depression.    Diagnosis:   ICD-10-CM   1. Generalized anxiety disorder  F41.1      Plan:   Patient working more on her symptoms of anxiety, depression, frustrations, and some family/friend issues related to her adult daughters and ongoing communication challenges.  Talked through some specific family/friend happenings more recently, looking at strategies that can be helpful for her in these relationships, also pointing out being careful not to make quick judgment or assumptions in situations and be able to calmly ask questions as needed for clarification.  Younger daughter continues to seem more adaptive and desiring of relationship with mother (patient).  Patient is showing progress particularly in her trying not to assume to quickly in situations and in working on her anxious/depressed talked patterns.  Definitely showing commitment to her goals and progress being made. Patient  encouraged to be practicing more of her positive and self affirming behaviors as noted in sessions including: Remain in the present and focused on what she can control or change, bleeding more in herself, healthy boundaries, interrupt anxious/depressive thoughts and challenge them to replace with more realistic thoughts, contact with supportive people, remain connected within her church family which is very supportive of her and her husband, allow her faith to be a healing resource for her emotionally as well as spiritually, use of positive self-talk, and recognize the strength she shows when working with goal-directed behaviors trying to move in a direction that supports her improved emotional health and her overall outlook into the future.  [name] is definitely showing progress, improve motivation and focus as she continues to work on her goal-directed behaviors, and moving herself in a healthier and more hopeful direction as she faces the future.  Goal review and progress/challenges noted with patient.  Next appointment within 3 weeks.   Barnie Bunde, LCSW

## 2023-11-22 DIAGNOSIS — G8929 Other chronic pain: Secondary | ICD-10-CM | POA: Diagnosis not present

## 2023-11-22 DIAGNOSIS — G5701 Lesion of sciatic nerve, right lower limb: Secondary | ICD-10-CM | POA: Diagnosis not present

## 2023-11-22 DIAGNOSIS — M7918 Myalgia, other site: Secondary | ICD-10-CM | POA: Diagnosis not present

## 2023-11-22 DIAGNOSIS — M47812 Spondylosis without myelopathy or radiculopathy, cervical region: Secondary | ICD-10-CM | POA: Diagnosis not present

## 2023-11-22 DIAGNOSIS — M48061 Spinal stenosis, lumbar region without neurogenic claudication: Secondary | ICD-10-CM | POA: Diagnosis not present

## 2023-11-23 ENCOUNTER — Encounter: Payer: Self-pay | Admitting: Obstetrics and Gynecology

## 2023-11-23 DIAGNOSIS — R202 Paresthesia of skin: Secondary | ICD-10-CM | POA: Diagnosis not present

## 2023-11-25 ENCOUNTER — Ambulatory Visit: Admitting: Physician Assistant

## 2023-11-25 ENCOUNTER — Ambulatory Visit: Payer: Self-pay | Admitting: Obstetrics and Gynecology

## 2023-11-25 ENCOUNTER — Encounter: Payer: Self-pay | Admitting: Physician Assistant

## 2023-11-25 DIAGNOSIS — F5101 Primary insomnia: Secondary | ICD-10-CM | POA: Diagnosis not present

## 2023-11-25 DIAGNOSIS — F330 Major depressive disorder, recurrent, mild: Secondary | ICD-10-CM

## 2023-11-25 DIAGNOSIS — F411 Generalized anxiety disorder: Secondary | ICD-10-CM | POA: Diagnosis not present

## 2023-11-25 DIAGNOSIS — R92322 Mammographic fibroglandular density, left breast: Secondary | ICD-10-CM | POA: Diagnosis not present

## 2023-11-25 DIAGNOSIS — R928 Other abnormal and inconclusive findings on diagnostic imaging of breast: Secondary | ICD-10-CM | POA: Diagnosis not present

## 2023-11-25 MED ORDER — LORAZEPAM 0.5 MG PO TABS: 0.2500 mg | ORAL_TABLET | Freq: Two times a day (BID) | ORAL | 5 refills | Status: DC | PRN

## 2023-11-25 MED ORDER — DULOXETINE HCL 60 MG PO CPEP
60.0000 mg | ORAL_CAPSULE | Freq: Every day | ORAL | 1 refills | Status: AC
Start: 2023-11-25 — End: ?

## 2023-11-25 MED ORDER — TRAZODONE HCL 50 MG PO TABS: 50.0000 mg | ORAL_TABLET | Freq: Every day | ORAL | 1 refills | Status: AC

## 2023-11-25 MED ORDER — BUSPIRONE HCL 10 MG PO TABS: 10.0000 mg | ORAL_TABLET | Freq: Two times a day (BID) | ORAL | 1 refills | Status: AC

## 2023-11-25 NOTE — Progress Notes (Signed)
 [facility] Med Check  Patient ID: [name],  MRN: [id number]  PCP: Avva, Ravisankar, MD  Date of Evaluation: 11/25/2023 Time spent:20 minutes  Chief Complaint:  Chief Complaint   Anxiety; Depression; Insomnia; Follow-up    HISTORY/CURRENT STATUS: HPI For routine med check.  Overall, [name] is doing well and feels that her meds are effective. Her husband has Parkinson's and dementia, it's stressful sometimes. But she's able to cope, seeing Marval Bunde, LCSW which is very helpful.  Gets overwhelmed sometimes.  Energy and motivation are good for the most part.  No extreme sadness, tearfulness, or feelings of hopelessness.  Sleeps well most of the time. ADLs and personal hygiene are normal.   No change in memory. Appetite has not changed.  No mania, delirium, AH/VH.  No SI/HI.  Individual Medical History/ Review of Systems: Changes? :No     Past Psychiatric Medication Trials: She reports that some medications helped for short periods of time and then were not as effective. Prozac- had episodic low sodium levels Paxil Celexa Lexapro Effexor  XR- Took in 2016 Pristiq- Took in 2015 and had episode of hyponatremia  Cymbalta - Took in 2017 and had episode of hyponatremia then. Unable to tolerate 90 mg.  Wellbutrin - Caused headaches Buspar  Rexulti- Was helpful for mood. Caused weight gain.  Lithium- Started 3 years ago during hospitalization Lamictal - caused some cognitive side effects Abilify - Took in 2016 Risperdal- Took in 2019. Has hyponatremia at that time.  Zyprexa- Took in May, 2019 Trileptal- Hyponatremia.  Carbamazepine- Caused severe hyponatremia.  Topamax -Cognitive side effects Gabapentin- unsure if this has been helpful. Reports taking long-term and reports that this was recently increased. Has been somewhat helpful for RLS.  Hydroxyzine- Unable to recall response.  Trazodone - Excessive daytime somnolence Cytomel Deplin- ineffective Diazepam- Took for  vertigo/possible vestibular migraines Ativan   Allergies: Codeine, Doxycycline , Ibuprofen, Nickel, Nsaids, and Sulfa antibiotics  Current Medications:  Current Outpatient Medications:    acetaminophen  (TYLENOL ) 500 MG tablet, Take 1,000 mg by mouth once as needed for mild pain or headache., Disp: , Rfl:    atenolol  (TENORMIN ) 25 MG tablet, TAKE ONE TABLET BY MOUTH DAILY, PLEASE MAKE APPOINTMENT WITH PROVIDER, THIS IS THE LAST REFILL UNTIL THEN, Disp: 28 tablet, Rfl: 0   baclofen  (LIORESAL ) 20 MG tablet, Take 20 mg by mouth in the morning and at bedtime., Disp: , Rfl:    Betahistine HCl POWD, 16 mg by Does not apply route 3 (three) times daily., Disp: , Rfl:    cyproheptadine  (PERIACTIN ) 4 MG tablet, Take 4 mg by mouth once., Disp: , Rfl:    denosumab  (PROLIA ) 60 MG/ML SOLN injection, Inject 60 mg into the skin every 6 (six) months. Administer in upper arm, thigh, or abdomen, Disp: , Rfl:    estradiol  (ESTRACE  VAGINAL) 0.1 MG/GM vaginal cream, Place 1 g vaginally 3 (three) times a week., Disp: 42.5 g, Rfl: 2   famotidine (PEPCID) 20 MG tablet, Take 20 mg by mouth daily., Disp: , Rfl:    levothyroxine  (SYNTHROID ) 112 MCG tablet, Take 112 mcg by mouth daily before breakfast. One hour before meal., Disp: , Rfl:    Magnesium 250 MG CAPS, Take by mouth 2 (two) times daily., Disp: , Rfl:    ondansetron  (ZOFRAN -ODT) 4 MG disintegrating tablet, Take by mouth as needed., Disp: , Rfl:    pantoprazole  (PROTONIX ) 40 MG tablet, Take 40 mg by mouth 2 (two) times daily., Disp: , Rfl:    rosuvastatin  (CRESTOR ) 20 MG tablet, Take 20 mg by mouth  daily., Disp: , Rfl:    triamterene-hydrochlorothiazide (DYAZIDE) 37.5-25 MG capsule, Take 1 capsule by mouth every morning., Disp: , Rfl:    valACYclovir  (VALTREX ) 500 MG tablet, TAKE ONE TABLET BY MOUTH DAILY, Disp: 90 tablet, Rfl: 3   Vitamin D, Ergocalciferol, (DRISDOL) 1.25 MG (50000 UNIT) CAPS capsule, Take 50,000 Units by mouth every 7 (seven) days., Disp: , Rfl:     busPIRone  (BUSPAR ) 10 MG tablet, Take 1 tablet (10 mg total) by mouth 2 (two) times daily., Disp: 180 tablet, Rfl: 1   CEQUA 0.09 % SOLN, Apply 1 drop to eye 2 (two) times daily., Disp: , Rfl:    DULoxetine  (CYMBALTA ) 60 MG capsule, Take 1 capsule (60 mg total) by mouth daily., Disp: 90 capsule, Rfl: 1   ipratropium (ATROVENT ) 0.06 % nasal spray, Place 1 spray into both nostrils in the morning and at bedtime., Disp: , Rfl:    LORazepam  (ATIVAN ) 0.5 MG tablet, Take 0.5-1 tablets (0.25-0.5 mg total) by mouth 2 (two) times daily as needed for anxiety., Disp: 30 tablet, Rfl: 5   LOTEMAX SM 0.38 % GEL, Apply 1 drop to eye 4 (four) times daily., Disp: , Rfl:    Polyvinyl Alcohol -Povidone (REFRESH OP), Apply to eye. (Patient not taking: Reported on 08/25/2023), Disp: , Rfl:    traZODone  (DESYREL ) 50 MG tablet, Take 1 tablet (50 mg total) by mouth at bedtime., Disp: 90 tablet, Rfl: 1   triamterene-hydrochlorothiazide (DYAZIDE) 37.5-25 MG capsule, Take 1 capsule by mouth daily., Disp: , Rfl:    Vibegron (GEMTESA) 75 MG TABS, Take 75 mg by mouth daily. (Patient not taking: Reported on 11/25/2023), Disp: , Rfl:  Medication Side Effects: none  Family Medical/ Social History: Changes? No  MENTAL HEALTH EXAM:  Last menstrual period 03/02/1988.There is no height or weight on file to calculate BMI.  General Appearance: Casual and Well Groomed  Eye Contact:  Good  Speech:  Clear and Coherent and Normal Rate  Volume:  Normal  Mood:  Euthymic  Affect:  Congruent  Thought Process:  Goal Directed and Descriptions of Associations: Circumstantial  Orientation:  Full (Time, Place, and Person)  Thought Content: Logical   Suicidal Thoughts:  No  Homicidal Thoughts:  No  Memory:  WNL  Judgement:  Good  Insight:  Good  Psychomotor Activity:  Normal  Concentration:  Concentration: Good and Attention Span: Good  Recall:  Good  Fund of Knowledge: Good  Language: Good  Assets:  Communication Skills Desire for  Improvement Financial Resources/Insurance Housing Talents/Skills Transportation  ADL's:  Intact  Cognition: WNL  Prognosis:  Good   DIAGNOSES:    ICD-10-CM   1. Major depressive disorder, recurrent episode, mild  F33.0 DULoxetine  (CYMBALTA ) 60 MG capsule    2. Generalized anxiety disorder  F41.1 busPIRone  (BUSPAR ) 10 MG tablet    DULoxetine  (CYMBALTA ) 60 MG capsule    LORazepam  (ATIVAN ) 0.5 MG tablet    3. Primary insomnia  F51.01 traZODone  (DESYREL ) 50 MG tablet    LORazepam  (ATIVAN ) 0.5 MG tablet      Receiving Psychotherapy: Yes  with Marval Bunde, LCSW  RECOMMENDATIONS:   PDMP review.  Ativan  filled 11/01/2023. I provided approximately  20 minutes of face to face time during this encounter, including time spent before and after the visit in records review, medical decision making, counseling pertinent to today's visit, and charting.   She is stable on current meds so no changes are needed.   Continue Buspar  10 mg, 1 po bid. Continue Cymbalta   60 mg, 1 every day. Continue Ativan  0.5 mg, 1 po qd prn. Continue Trazodone  50 mg, 1 at bedtime.  Continue therapy with Marval Bunde, LCSW. Return in 3 months.   Verneita Cooks, PA-C

## 2023-11-26 ENCOUNTER — Ambulatory Visit: Admitting: Psychiatry

## 2023-11-26 ENCOUNTER — Encounter: Payer: Self-pay | Admitting: Obstetrics and Gynecology

## 2023-11-26 DIAGNOSIS — F411 Generalized anxiety disorder: Secondary | ICD-10-CM | POA: Diagnosis not present

## 2023-11-26 NOTE — Progress Notes (Signed)
 [facility] Counselor/Therapist Progress Note  Patient ID: [name], MRN: [id number],    Date: 11/26/2023  Time Spent: 53 minutes   Treatment Type: Individual Therapy  Reported Symptoms: anxiety, depression has now heightened some due to some unexpected circumstances    Mental Status Exam:  Appearance:   Neat     Behavior:  Appropriate and Motivated  Motor:  Normal  Speech/Language:   Clear and Coherent  Affect:  Depressed and anxious  Mood:  anxious and depressed  Thought process:  goal directed  Thought content:    Rumination  Sensory/Perceptual disturbances:    WNL  Orientation:  oriented to person, place, time/date, situation, day of week, month of year, year, and stated date of Sept. 26, 2025  Attention:  Good  Concentration:  Good  Memory:  WNL  Fund of knowledge:   Good  Insight:    Good and Fair  Judgment:   Good  Impulse Control:  Good and Fair   Risk Assessment: Danger to Self:  No Self-injurious Behavior: No Danger to Others: No Duty to Warn:no Physical Aggression / Violence:No  Access to Firearms a concern: No  Gang Involvement:No   Subjective:   Patient working in session today further on her depression and anxiety, along with some family relationship issues. Escalated issues in marital relationship, some in part related to husband's of some dementia per the [employer]. Some very sensitive communication with husband and discussed what husband had written and was hurtful to patient. Talked through this today which seemed to help her, but hard to make some changes, including thought patterns.  Difficult not to jump to conclusions on certain things.  Encouraged her reinforcement of positive behaviors with has been and creating some alternative activities for them to be involved in together.  Not all details included in this note due to patient privacy needs.  Some increased anxiety more recently but less sadness and working on her tendency  to look sometimes at what might go wrong versus right.  Heightened sensitivity more recently with relationship issues.  Continue to encourage patient to do more things away from the computer and offline and she was able to name several activities that she and her husband might be able to enjoy together including physical puzzles versus online puzzles, and making more contact in person with friends.  They do have post bonds with their fellow church members and that seems to be a positive for them.  Interventions: Cognitive Behavioral Therapy, Solution-Oriented/Positive Psychology, and Ego-Supportive Long term goal: Develop healthy cognitive patterns and beliefs about self and the world that lead to alleviation of depression and anxiety, and help prevent relapse of depression and anxiety. Short term goal: Learn and implement personal skills for managing stress, solving daily problems, and resolving conflicts effectively.  Strategies: Use modeling and role-playing to help recognize anxious/depressive/negative thought patterns that create anxious/depressive/negative feelings and actions, interrupt them and replace with more positive reality-based thoughts that do not support depression.   Diagnosis:   ICD-10-CM   1. Generalized anxiety disorder  F41.1      Plan:  Patient today in session focusing more on her anxiety, frustrations, depression, family/friend issues related to adult daughters and communication challenges within those relationships.  Did well well in talking through multiple recent stressors, particularly in relationship with her husband who is having some dementia however denies it.  Encouraged patient to continue her work with not making quick assumptions in situations and rushing to judgment.  Also continuing to work with some strategies previously discussed with her anxiety and depression that have been helpful.  She does remain committed to her goals and stated that it really helped for  her to be able to come in today and talk through more recent stressors laded to her marital relationship.  They do continue to be involved in some activities through the [employer].  Reminded and encouraged patient to be practicing more of the positive and self affirming behaviors that we highlight in sessions including: Stay in the present and focused on what she can control or change, believing more in herself, healthy boundaries, interrupt anxious/depressive thoughts and challenge them to replace with more realistic thoughts, contact with supportive people, remain connected within her church family which is very supportive of her and her husband, allow her faith to be a healing resource for her emotionally as well as spiritually, use of positive self-talk, and realize the strength she shows when working with goal-directed behaviors to move in a direction that best supports her improved emotional health and overall outlook into the future.  [name] continues to show progress even in the midst of obstacles at times, improving her motivation and focus as she continues working on goal-directed behaviors and moving herself in a healthier more hopeful direction into the future.  Goal review and progress/challenges noted with patient.  Next appointment within 2 to 3 weeks.   Barnie Bunde, LCSW

## 2023-11-28 ENCOUNTER — Ambulatory Visit: Payer: Self-pay | Admitting: Obstetrics and Gynecology

## 2023-11-29 ENCOUNTER — Encounter: Payer: Self-pay | Admitting: Obstetrics and Gynecology

## 2023-11-30 ENCOUNTER — Telehealth: Payer: Self-pay | Admitting: *Deleted

## 2023-11-30 DIAGNOSIS — M5418 Radiculopathy, sacral and sacrococcygeal region: Secondary | ICD-10-CM | POA: Diagnosis not present

## 2023-11-30 NOTE — Telephone Encounter (Signed)
 Call to patient. Patient states she has been on the Estradiol  cream for a while now, but states she feels like in the past year, using the cream has become more difficult with the vaginal atrophy. States the cream is causing itching and irritation. Patient states these are new symptoms for her that have developed over the last year. Patient not currently taking any supplements, but asking if Dr. Nikki has any recommendations for supplementation to help with vaginal atrophy. RN advised would review with Dr. Nikki and return call with recommendations. Patient agreeable.

## 2023-11-30 NOTE — Telephone Encounter (Signed)
 Alternatives to the vaginal estrogen could be vaginal Intrarosa or vaginal vitamin E.  Intrarosa is specifically prescribed to treat vaginal atrophy.  It is a steroid that metabolizes into estrogen and androgen hormones.  It is used nightly.   Side effect could be vaginal discharge.  If she would like to try it, please make an appointment with me for a 3 month recheck.   Vaginal vitamin E exists in a liquid form or a suppository.  It can be purchased on [employer] without a prescription.    The Diamond would likely be more effective than the vitamin E.

## 2023-12-01 DIAGNOSIS — G43019 Migraine without aura, intractable, without status migrainosus: Secondary | ICD-10-CM | POA: Diagnosis not present

## 2023-12-02 ENCOUNTER — Other Ambulatory Visit: Payer: Self-pay | Admitting: Obstetrics and Gynecology

## 2023-12-02 MED ORDER — INTRAROSA 6.5 MG VA INST: 6.5000 mg | VAGINAL_INSERT | Freq: Every evening | VAGINAL | 3 refills | Status: DC

## 2023-12-02 NOTE — Telephone Encounter (Signed)
 Encounter closed

## 2023-12-02 NOTE — Telephone Encounter (Signed)
 Spoke with patient, advised per Dr. Nikki. Patient would like to try Intrarosa.  OV scheduled for 03/06/24 at 1030 with Dr. Nikki. Patient aware to call if any concerns.   Rx pended

## 2023-12-02 NOTE — Telephone Encounter (Signed)
 Rx for Intrarosa sent to [facility].

## 2023-12-06 DIAGNOSIS — K2289 Other specified disease of esophagus: Secondary | ICD-10-CM | POA: Diagnosis not present

## 2023-12-06 DIAGNOSIS — Z7982 Long term (current) use of aspirin: Secondary | ICD-10-CM | POA: Diagnosis not present

## 2023-12-06 DIAGNOSIS — R0789 Other chest pain: Secondary | ICD-10-CM | POA: Diagnosis not present

## 2023-12-06 DIAGNOSIS — Z79899 Other long term (current) drug therapy: Secondary | ICD-10-CM | POA: Diagnosis not present

## 2023-12-06 DIAGNOSIS — M1712 Unilateral primary osteoarthritis, left knee: Secondary | ICD-10-CM | POA: Diagnosis not present

## 2023-12-06 DIAGNOSIS — M7502 Adhesive capsulitis of left shoulder: Secondary | ICD-10-CM | POA: Diagnosis not present

## 2023-12-06 DIAGNOSIS — I493 Ventricular premature depolarization: Secondary | ICD-10-CM | POA: Diagnosis not present

## 2023-12-06 DIAGNOSIS — E039 Hypothyroidism, unspecified: Secondary | ICD-10-CM | POA: Diagnosis not present

## 2023-12-06 DIAGNOSIS — G43909 Migraine, unspecified, not intractable, without status migrainosus: Secondary | ICD-10-CM | POA: Diagnosis not present

## 2023-12-06 DIAGNOSIS — G4733 Obstructive sleep apnea (adult) (pediatric): Secondary | ICD-10-CM | POA: Diagnosis not present

## 2023-12-06 DIAGNOSIS — K2100 Gastro-esophageal reflux disease with esophagitis, without bleeding: Secondary | ICD-10-CM | POA: Diagnosis not present

## 2023-12-06 DIAGNOSIS — K449 Diaphragmatic hernia without obstruction or gangrene: Secondary | ICD-10-CM | POA: Diagnosis not present

## 2023-12-06 DIAGNOSIS — M19042 Primary osteoarthritis, left hand: Secondary | ICD-10-CM | POA: Diagnosis not present

## 2023-12-06 DIAGNOSIS — M19041 Primary osteoarthritis, right hand: Secondary | ICD-10-CM | POA: Diagnosis not present

## 2023-12-08 ENCOUNTER — Ambulatory Visit: Admitting: Psychiatry

## 2023-12-09 DIAGNOSIS — Z974 Presence of external hearing-aid: Secondary | ICD-10-CM | POA: Diagnosis not present

## 2023-12-09 DIAGNOSIS — R053 Chronic cough: Secondary | ICD-10-CM | POA: Diagnosis not present

## 2023-12-09 DIAGNOSIS — K219 Gastro-esophageal reflux disease without esophagitis: Secondary | ICD-10-CM | POA: Diagnosis not present

## 2023-12-09 DIAGNOSIS — H918X3 Other specified hearing loss, bilateral: Secondary | ICD-10-CM | POA: Diagnosis not present

## 2023-12-09 DIAGNOSIS — H8103 Meniere's disease, bilateral: Secondary | ICD-10-CM | POA: Diagnosis not present

## 2023-12-09 DIAGNOSIS — H9193 Unspecified hearing loss, bilateral: Secondary | ICD-10-CM | POA: Diagnosis not present

## 2023-12-13 ENCOUNTER — Encounter: Payer: Self-pay | Admitting: Obstetrics and Gynecology

## 2023-12-14 DIAGNOSIS — N3281 Overactive bladder: Secondary | ICD-10-CM | POA: Diagnosis not present

## 2023-12-14 DIAGNOSIS — R350 Frequency of micturition: Secondary | ICD-10-CM | POA: Diagnosis not present

## 2023-12-14 DIAGNOSIS — R351 Nocturia: Secondary | ICD-10-CM | POA: Diagnosis not present

## 2023-12-15 DIAGNOSIS — S3210XA Unspecified fracture of sacrum, initial encounter for closed fracture: Secondary | ICD-10-CM | POA: Diagnosis not present

## 2023-12-16 ENCOUNTER — Ambulatory Visit (INDEPENDENT_AMBULATORY_CARE_PROVIDER_SITE_OTHER): Admitting: Psychiatry

## 2023-12-16 DIAGNOSIS — F411 Generalized anxiety disorder: Secondary | ICD-10-CM | POA: Diagnosis not present

## 2023-12-16 DIAGNOSIS — L820 Inflamed seborrheic keratosis: Secondary | ICD-10-CM | POA: Diagnosis not present

## 2023-12-16 DIAGNOSIS — L821 Other seborrheic keratosis: Secondary | ICD-10-CM | POA: Diagnosis not present

## 2023-12-16 NOTE — Progress Notes (Signed)
 [facility] Counselor/Therapist Progress Note  Patient ID: [name], MRN: [id number],    Date: 12/16/2023  Time Spent: 53 minutes   Treatment Type: Individual Therapy  Reported Symptoms: anxiety, some depression    Mental Status Exam:  Appearance:   Casual     Behavior:  Appropriate, Sharing, and Motivated  Motor:  Normal  Speech/Language:   Clear and Coherent  Affect:  anxious  Mood:  anxious and some depression  Thought process:  normal  Thought content:    WNL  Sensory/Perceptual disturbances:    WNL  Orientation:  oriented to person, place, time/date, situation, day of week, month of year, year, and stated date of Oct. 16, 2025  Attention:  Good  Concentration:  Good  Memory:  WNL  Fund of knowledge:   Good  Insight:    Good  Judgment:   Good  Impulse Control:  Good   Risk Assessment: Danger to Self:  No Self-injurious Behavior: No Danger to Others: No Duty to Warn:no Physical Aggression / Violence:No  Access to Firearms a concern: No  Gang Involvement:No   Subjective:  Patient today in session and working further on some issues of hurt and trust re: relationship with husband today which has been very difficult. Husband also trying to lose weight and that is difficult. Concerned today about their relationship and communication difficulties, husband's memory issues and how his understanding is impacted.  Patient needing to talk through these changes as well as some fears about the future, while in session today.  This seemed very helpful to her as some of the things she wants to be able to talk about privately and then confidence.  Trying more not to jump to conclusions.  Continue to encourage her reinforcement of positive behaviors with her husband who is having some memory issues.  Not all details included in this note due to patient privacy needs.  Still some heightened sensitivity within her relationship with husband and am coaching her to look more for  what might go right versus wrong and some of their interactions.  Staying connected with their fellow church members is helpful and getting out more during the day is also a positive for them.   Interventions: Cognitive Behavioral Therapy, Solution-Oriented/Positive Psychology, and Ego-Supportive Long term goal: Develop healthy cognitive patterns and beliefs about self and the world that lead to alleviation of depression and anxiety, and help prevent relapse of depression and anxiety. Short term goal: Learn and implement personal skills for managing stress, solving daily problems, and resolving conflicts effectively.  Strategies: Use modeling and role-playing to help recognize anxious/depressive/negative thought patterns that create anxious/depressive/negative feelings and actions, interrupt them and replace with more positive reality-based thoughts that do not support depression.   Diagnosis:   ICD-10-CM   1. Generalized anxiety disorder  F41.1      Plan:  Patient processing concerns in her marital relationship today especially relating to being able to her husband and helpful ways especially under stress or when his anxiety is heightened.  Talking through some more recent challenging moments in their relationship and there does seem to be progress made.  Encouraged patient to continue her work with not making quick assumptions within their relationship and refrain from rushing to judgment.  Encouraged to pay attention to the strategies that previously have seemed to be helpful and used them more in the present.  Patient remains very focused on her goals as well as the goals for she and her  husband in their marital relationship.  Involved in some helpful activities through the [employer] and encouraged them to continue.  Encouraged patient to be practicing more the positive and self affirming behaviors that we highlight in sessions including: Remain in the present and focused on what she can control or  change, leaning more in herself, healthy boundaries, interrupt anxious/depressive thoughts and challenge them to replace with more realistic thoughts, contact with supportive people, remain connected within her church family which is very supportive of her and her husband, allow her faith to be a healing resource for her emotionally as well as spiritually, positive self-talk, and recognize the strength she shows when she works with goal-directed behaviors to move in a direction that best supports her overall improved emotional health and outlook into the future.  [name] continues to show progress even in the midst of obstacles at times, improving her motivation and focus as she continues working on goal-directed behaviors and moving herself in a healthier and more hopeful direction.  Goal review and progress/challenges noted with patient.  Next appointment within 3 weeks.   Barnie Bunde, LCSW

## 2023-12-23 DIAGNOSIS — Z23 Encounter for immunization: Secondary | ICD-10-CM | POA: Diagnosis not present

## 2023-12-24 DIAGNOSIS — M5418 Radiculopathy, sacral and sacrococcygeal region: Secondary | ICD-10-CM | POA: Diagnosis not present

## 2023-12-24 DIAGNOSIS — M5416 Radiculopathy, lumbar region: Secondary | ICD-10-CM | POA: Diagnosis not present

## 2023-12-29 ENCOUNTER — Other Ambulatory Visit: Payer: Self-pay | Admitting: Obstetrics and Gynecology

## 2023-12-29 ENCOUNTER — Ambulatory Visit (INDEPENDENT_AMBULATORY_CARE_PROVIDER_SITE_OTHER): Admitting: Psychiatry

## 2023-12-29 DIAGNOSIS — F411 Generalized anxiety disorder: Secondary | ICD-10-CM

## 2023-12-29 MED ORDER — NONFORMULARY OR COMPOUNDED ITEM: 1 refills | Status: AC

## 2023-12-29 NOTE — Progress Notes (Signed)
 [facility] Counselor/Therapist Progress Note  Patient ID: [name], MRN: [id number],    Date: 12/29/2023  Time Spent: 55 minutes   Treatment Type: Individual Therapy  Reported Symptoms: anxiety, some depression    Mental Status Exam:  Appearance:   Casual and Well Groomed     Behavior:  Appropriate, Sharing, and Motivated  Motor:  Normal  Speech/Language:   Clear and Coherent  Affect:  anxious  Mood:  anxious and some depression  Thought process:  goal directed  Thought content:    WNL  Sensory/Perceptual disturbances:    WNL  Orientation:  oriented to person, place, time/date, situation, day of week, month of year, year, and stated date of Oct. 29, 2025  Attention:  Good  Concentration:  Good  Memory:  WNL  Fund of knowledge:   Good  Insight:    Good and Fair  Judgment:   Good  Impulse Control:  Good   Risk Assessment: Danger to Self:  No Self-injurious Behavior: No Danger to Others: No Duty to Warn:no Physical Aggression / Violence:No  Access to Firearms a concern: No  Gang Involvement:No   Subjective: Patient today updating from last session and sharing that prior issues of hurt/trust with husband has definitely improved and they are trying to move forward together now. Planning a vacation trip with husband's daughters sometime soon.  Worries about the world and keeps up with a lot that is happening, sharing some of her concerns in session today, and also acknowledging how that type of information is really not helpful for her to dwell on as it does increase her worry level.  Encouraged patient to set healthier limits in watching so much political news on her phone or TV. Reports she and husband are better now with their prior hurt and trust issues. Relationship in marriage she feels is improving. Some fear and anxiety about the future especially re: their health but currently are doing ok. Did encourage some exercise for patient and she shares that her  Dr has advised more movement as well for patient. Shares her relief that she and husband were able to work through their recent hurt involving trust issues and wants to recall their strategies for doing this in case they have another situation in the future.  Trying to avoid making assumptions into the future.  Husband is having some memory issues although she feels it is not excessive at this point, but remains under the care of the [employer].  Remains very connected to their church and feel they have multiple good connections there and a group of people that really care about them.   Interventions: Cognitive Behavioral Therapy, Solution-Oriented/Positive Psychology, and Ego-Supportive Long term goal: Develop healthy cognitive patterns and beliefs about self and the world that lead to alleviation of depression and anxiety, and help prevent relapse of depression and anxiety. Short term goal: Learn and implement personal skills for managing stress, solving daily problems, and resolving conflicts effectively.  Strategies: Use modeling and role-playing to help recognize anxious/depressive/negative thought patterns that create anxious/depressive/negative feelings and actions, interrupt them and replace with more positive reality-based thoughts that do not support depression.    Diagnosis:   ICD-10-CM   1. Generalized anxiety disorder  F41.1      Plan: Patient today feeling more positive about her marriage especially after they were able to work through some sensitive issues in recent weeks.  Still having occasional challenges in their marriage but hopefully has better tools now  to work on it when things do arise.  Continue to encourage patient in holding onto strategies that they have found to be helpful within their marriage and for her personally to work on not making quick assumptions on issues that may arise and be willing for her and husband to talk things through more thoroughly.  Patient does seem to  be happier today and smiling more.  Encouraged and reviewed with patient some of the positive and self affirming behaviors that we have highlighted and sessions including: Stay on the present and focused on what she can control or change, believing more in herself, healthy boundaries, interrupt anxious/depressive thoughts and challenge them to replace with more realistic thoughts, contact with supportive people, remain connected within her church family which is very supportive of her and her husband, allow her faith to be a healing resource for her emotionally as well as spiritually, positive self-talk, and realize the strength she shows when she works with goal-directed behaviors to move in a direction that best supports her overall improved emotional health and her vision into the future.  Shevawn Langenberg has shown progress even in the midst of obstacles at times, improving her focus and motivation as she continues working on goal-directed behaviors aimed in a healthier and more hopeful direction  Goal review and progress/challenges noted with patient.  Next appointment within 3 weeks.   Barnie Bunde, LCSW

## 2023-12-30 DIAGNOSIS — M810 Age-related osteoporosis without current pathological fracture: Secondary | ICD-10-CM | POA: Diagnosis not present

## 2024-01-04 ENCOUNTER — Other Ambulatory Visit: Payer: Self-pay | Admitting: Obstetrics and Gynecology

## 2024-01-04 MED ORDER — ESTRADIOL 10 MCG VA TABS: 1.0000 | ORAL_TABLET | VAGINAL | 1 refills | Status: DC

## 2024-01-04 NOTE — Progress Notes (Signed)
 Rx for Vagifem.

## 2024-01-10 DIAGNOSIS — J302 Other seasonal allergic rhinitis: Secondary | ICD-10-CM | POA: Diagnosis not present

## 2024-01-10 DIAGNOSIS — R053 Chronic cough: Secondary | ICD-10-CM | POA: Diagnosis not present

## 2024-01-11 DIAGNOSIS — J302 Other seasonal allergic rhinitis: Secondary | ICD-10-CM | POA: Diagnosis not present

## 2024-01-13 ENCOUNTER — Ambulatory Visit: Admitting: Psychiatry

## 2024-01-14 DIAGNOSIS — Z23 Encounter for immunization: Secondary | ICD-10-CM | POA: Diagnosis not present

## 2024-01-17 DIAGNOSIS — M533 Sacrococcygeal disorders, not elsewhere classified: Secondary | ICD-10-CM | POA: Diagnosis not present

## 2024-01-17 DIAGNOSIS — M5416 Radiculopathy, lumbar region: Secondary | ICD-10-CM | POA: Diagnosis not present

## 2024-01-17 DIAGNOSIS — M25552 Pain in left hip: Secondary | ICD-10-CM | POA: Diagnosis not present

## 2024-01-17 DIAGNOSIS — G8929 Other chronic pain: Secondary | ICD-10-CM | POA: Diagnosis not present

## 2024-01-18 NOTE — Telephone Encounter (Signed)
 PA sent to patients insurance for vaginal estradiol  tablets. Waiting for response.  (Key: [id number])

## 2024-01-26 DIAGNOSIS — G4733 Obstructive sleep apnea (adult) (pediatric): Secondary | ICD-10-CM | POA: Diagnosis not present

## 2024-01-26 DIAGNOSIS — M47812 Spondylosis without myelopathy or radiculopathy, cervical region: Secondary | ICD-10-CM | POA: Diagnosis not present

## 2024-02-01 DIAGNOSIS — G8929 Other chronic pain: Secondary | ICD-10-CM | POA: Diagnosis not present

## 2024-02-01 DIAGNOSIS — M25552 Pain in left hip: Secondary | ICD-10-CM | POA: Diagnosis not present

## 2024-02-01 DIAGNOSIS — E785 Hyperlipidemia, unspecified: Secondary | ICD-10-CM | POA: Diagnosis not present

## 2024-02-01 DIAGNOSIS — M533 Sacrococcygeal disorders, not elsewhere classified: Secondary | ICD-10-CM | POA: Diagnosis not present

## 2024-02-01 DIAGNOSIS — M5416 Radiculopathy, lumbar region: Secondary | ICD-10-CM | POA: Diagnosis not present

## 2024-02-01 DIAGNOSIS — Z1212 Encounter for screening for malignant neoplasm of rectum: Secondary | ICD-10-CM | POA: Diagnosis not present

## 2024-02-03 ENCOUNTER — Ambulatory Visit

## 2024-02-03 ENCOUNTER — Ambulatory Visit: Admitting: Psychiatry

## 2024-02-03 ENCOUNTER — Ambulatory Visit: Admitting: Podiatry

## 2024-02-03 VITALS — Ht 65.0 in | Wt 148.0 lb

## 2024-02-03 DIAGNOSIS — M79672 Pain in left foot: Secondary | ICD-10-CM

## 2024-02-03 DIAGNOSIS — G629 Polyneuropathy, unspecified: Secondary | ICD-10-CM | POA: Diagnosis not present

## 2024-02-03 DIAGNOSIS — M79671 Pain in right foot: Secondary | ICD-10-CM | POA: Diagnosis not present
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
## 2024-02-08 DIAGNOSIS — R82998 Other abnormal findings in urine: Secondary | ICD-10-CM | POA: Diagnosis not present

## 2024-02-17 ENCOUNTER — Ambulatory Visit: Admitting: Psychiatry

## 2024-02-17 DIAGNOSIS — F411 Generalized anxiety disorder: Secondary | ICD-10-CM | POA: Diagnosis not present

## 2024-02-17 NOTE — Progress Notes (Signed)
 [facility] Counselor/Therapist Progress Note  Patient ID: [name], MRN: [id number],    Date: 02/17/2024  Time Spent: 53 minutes in person appt.  Treatment Type: Individual Therapy  Reported Symptoms: Anxiety, some depression   Mental Status Exam:  Appearance:   Casual and Neat     Behavior:  Appropriate, Sharing, and Motivated  Motor:  Normal  Speech/Language:   Clear and Coherent  Affect:  Anxious, depressed  Mood:  anxious and depressed  Thought process:  normal  Thought content:    Rumination  Sensory/Perceptual disturbances:    WNL  Orientation:  oriented to person, place, time/date, situation, day of week, month of year, year, and stated date of Dec. 18, 2025  Attention:  Good  Concentration:  Good  Memory:  WNL  Fund of knowledge:   Good  Insight:    Good  Judgment:   Good  Impulse Control:  Good   Risk Assessment: Danger to Self:  No Self-injurious Behavior: No Danger to Others: No Duty to Warn:no Physical Aggression / Violence:No  Access to Firearms a concern: No  Gang Involvement:No   Subjective:   Patient in session today working further on her anxiety, stress management, and some depression. Having some back and neck pain that she is getting treated for recently.  No SI.  Husband having some breathing/oxygen issues and is being treated by his doctor.  Today needing to identify, talk through, and work on family communication and boundary issues. Feels almost threatened verbally by oldest daughter re: issues with patient not being allowed to see grandchildren.  Lots of frustration, some anger, and difficulty working through issues related to oldest daughter and patient.  Patient also having some questions about grandparent rights regarding being able to see her 32/75-year-old granddaughter.  She has secured an appointment on December 23 with an attorney that specializes in these type of situations, which will likely be helpful for patient.  In the  meantime we discussed some self-care for patient herself as well as letting friends with whom she is comfortable, being of support for her also, particularly emotional support.  Therapist continues to encourage patient to set healthier limits and watching so much political news on her phone and TV as this does impact her anxiety level.  Shares that she and her husband are doing some better in their relationship and feels that it can improve more gradually.  Husband also having some memory issues but she does not feel it is excessive considering his age and health.  Her church remains in place of comfort and support and they have several good connections there with whom they keep in touch frequently.   Interventions: Cognitive Behavioral Therapy, Solution-Oriented/Positive Psychology, and Ego-Supportive Long term goal: Develop healthy cognitive patterns and beliefs about self and the world that lead to alleviation of depression and anxiety, and help prevent relapse of depression and anxiety. Short term goal: Learn and implement personal skills for managing stress, solving daily problems, and resolving conflicts effectively.  Strategies: Use modeling and role-playing to help recognize anxious/depressive/negative thought patterns that create anxious/depressive/negative feelings and actions, interrupt them and replace with more positive reality-based thoughts that do not support depression.    Diagnosis:   ICD-10-CM   1. Generalized anxiety disorder  F41.1      Plan:   Patient today continues her focus on her own personal issues regarding some anxiety and frustrations, issues regarding grandparent rights with a minor aged granddaughter, and also as some  of her issues are impactful in her marriage.  Struggling with some issues but showing better energy, drive, and motivation in session today especially after she got to talking more openly.  Encouraged patient to continue working with some of the  strategies we have worked on previously and handling stressors and also not to make quick assumptions on issues, being able to talk them out more thoroughly with resources that can help her. Therapist encouraged patient to continue working on positive and self affirming behaviors that we have highlighted in sessions including: Stay in the present and focused on what she can control or change, believe more in herself, use of healthy boundaries, interrupt anxious/depressive thoughts and challenge them to replace with more realistic thoughts, contact with supportive people, remain connected within her church family which is very supportive of her and her husband, allow her faith to be a healing resource for her emotionally as well as spiritually, positive self-talk, and recognize the strengths she shows when she works with goal-directed behaviors helping her move in a direction that best supports her overall improved emotional health and her vision into the future.  Montoya Watkin continues to show progress even in the midst of obstacles at times, she is improving her focus and motivation and continues to work with goal-directed behaviors aimed to help her move in a healthier and more hopeful direction going forward.  Goal review and progress/challenges noted with patient.  Next appointment within 3 weeks.   Barnie Bunde, LCSW

## 2024-02-22 ENCOUNTER — Encounter: Payer: Self-pay | Admitting: Obstetrics and Gynecology

## 2024-02-28 ENCOUNTER — Ambulatory Visit: Admitting: Physician Assistant

## 2024-02-28 ENCOUNTER — Encounter: Payer: Self-pay | Admitting: Physician Assistant

## 2024-02-28 DIAGNOSIS — F5101 Primary insomnia: Secondary | ICD-10-CM

## 2024-02-28 DIAGNOSIS — F3342 Major depressive disorder, recurrent, in full remission: Secondary | ICD-10-CM | POA: Diagnosis not present

## 2024-02-28 DIAGNOSIS — F411 Generalized anxiety disorder: Secondary | ICD-10-CM | POA: Diagnosis not present

## 2024-02-28 MED ORDER — LORAZEPAM 0.5 MG PO TABS: 0.2500 mg | ORAL_TABLET | Freq: Two times a day (BID) | ORAL | 0 refills | Status: AC | PRN

## 2024-02-28 NOTE — Progress Notes (Signed)
 "     [facility] Med Check  Patient ID: [name],  MRN: [id number]  PCP: Avva, Ravisankar, MD  Date of Evaluation: 02/28/2024 Time spent:20 minutes  Chief Complaint:  Chief Complaint   Anxiety; Depression; Insomnia; Follow-up    HISTORY/CURRENT STATUS: HPI For routine med check.  Doing well with her current meds. She is able to enjoy things.  Energy and motivation are good.  No extreme sadness, tearfulness, or feelings of hopelessness.  Sleeps well most of the time with the trazodone .  She also takes the Ativan  in the evening which helps her relax to go to sleep.  She never has to take Ativan  during the day.  ADLs and personal hygiene are normal.   Denies any changes in concentration, making decisions, or remembering things.  Appetite has not changed.  No mania, delirium, AH/VH.  No SI/HI.  Individual Medical History/ Review of Systems: Changes? :No     Past Psychiatric Medication Trials: She reports that some medications helped for short periods of time and then were not as effective. Prozac- had episodic low sodium levels Paxil Celexa Lexapro Effexor  XR- Took in 2016 Pristiq- Took in 2015 and had episode of hyponatremia  Cymbalta - Took in 2017 and had episode of hyponatremia then. Unable to tolerate 90 mg.  Wellbutrin - Caused headaches Buspar  Rexulti- Was helpful for mood. Caused weight gain.  Lithium- Started 3 years ago during hospitalization Lamictal - caused some cognitive side effects Abilify - Took in 2016 Risperdal- Took in 2019. Has hyponatremia at that time.  Zyprexa- Took in May, 2019 Trileptal- Hyponatremia.  Carbamazepine- Caused severe hyponatremia.  Topamax -Cognitive side effects Gabapentin- unsure if this has been helpful. Reports taking long-term and reports that this was recently increased. Has been somewhat helpful for RLS.  Hydroxyzine- Unable to recall response.  Trazodone - Excessive daytime somnolence Cytomel Deplin- ineffective Diazepam- Took  for vertigo/possible vestibular migraines Ativan   Allergies: Codeine, Doxycycline , Ibuprofen, Nickel, Nsaids, and Sulfa antibiotics  Current Medications:  Current Outpatient Medications:    acetaminophen  (TYLENOL ) 500 MG tablet, Take 1,000 mg by mouth once as needed for mild pain or headache., Disp: , Rfl:    atenolol  (TENORMIN ) 25 MG tablet, TAKE ONE TABLET BY MOUTH DAILY, PLEASE MAKE APPOINTMENT WITH PROVIDER, THIS IS THE LAST REFILL UNTIL THEN, Disp: 28 tablet, Rfl: 0   baclofen  (LIORESAL ) 20 MG tablet, Take 20 mg by mouth in the morning and at bedtime., Disp: , Rfl:    Betahistine HCl POWD, 16 mg by Does not apply route 3 (three) times daily., Disp: , Rfl:    busPIRone  (BUSPAR ) 10 MG tablet, Take 1 tablet (10 mg total) by mouth 2 (two) times daily., Disp: 180 tablet, Rfl: 1   cyproheptadine  (PERIACTIN ) 4 MG tablet, Take 4 mg by mouth once., Disp: , Rfl:    DULoxetine  (CYMBALTA ) 60 MG capsule, Take 1 capsule (60 mg total) by mouth daily., Disp: 90 capsule, Rfl: 1   famotidine (PEPCID) 20 MG tablet, Take 20 mg by mouth daily., Disp: , Rfl:    ipratropium (ATROVENT ) 0.06 % nasal spray, Place 1 spray into both nostrils in the morning and at bedtime., Disp: , Rfl:    levothyroxine  (SYNTHROID ) 112 MCG tablet, Take 112 mcg by mouth daily before breakfast. One hour before meal., Disp: , Rfl:    Magnesium 250 MG CAPS, Take by mouth 2 (two) times daily., Disp: , Rfl:    NONFORMULARY OR COMPOUNDED ITEM, Compounded vaginal vitamin E suppository, 200 international units.  Place one suppository on  the vaginal at bedtime twice weekly.  Dispense:  24.  Refill:  1.  [facility]., Disp: 24 each, Rfl: 1   ondansetron  (ZOFRAN -ODT) 4 MG disintegrating tablet, Take by mouth as needed., Disp: , Rfl:    pantoprazole  (PROTONIX ) 40 MG tablet, Take 40 mg by mouth 2 (two) times daily., Disp: , Rfl:    rosuvastatin  (CRESTOR ) 20 MG tablet, Take 20 mg by mouth daily., Disp: , Rfl:    traZODone  (DESYREL ) 50 MG  tablet, Take 1 tablet (50 mg total) by mouth at bedtime., Disp: 90 tablet, Rfl: 1   triamterene-hydrochlorothiazide (DYAZIDE) 37.5-25 MG capsule, Take 1 capsule by mouth daily., Disp: , Rfl:    valACYclovir  (VALTREX ) 500 MG tablet, TAKE ONE TABLET BY MOUTH DAILY, Disp: 90 tablet, Rfl: 3   denosumab  (PROLIA ) 60 MG/ML SOLN injection, Inject 60 mg into the skin every 6 (six) months. Administer in upper arm, thigh, or abdomen, Disp: , Rfl:    Estradiol  (VAGIFEM ) 10 MCG TABS vaginal tablet, Place 1 tablet (10 mcg total) vaginally 2 (two) times a week. Use every night before bed for two weeks when you first begin this medicine, then after the first two weeks, begin using it twice a week., Disp: 34 tablet, Rfl: 1   LORazepam  (ATIVAN ) 0.5 MG tablet, Take 0.5-1 tablets (0.25-0.5 mg total) by mouth 2 (two) times daily as needed for anxiety., Disp: 90 tablet, Rfl: 0   Polyvinyl Alcohol -Povidone (REFRESH OP), Apply to eye. (Patient not taking: Reported on 02/03/2024), Disp: , Rfl:    triamterene-hydrochlorothiazide (DYAZIDE) 37.5-25 MG capsule, Take 1 capsule by mouth every morning., Disp: , Rfl:    Vibegron (GEMTESA) 75 MG TABS, Take 75 mg by mouth daily. (Patient not taking: Reported on 11/25/2023), Disp: , Rfl:    Vitamin D, Ergocalciferol, (DRISDOL) 1.25 MG (50000 UNIT) CAPS capsule, Take 50,000 Units by mouth every 7 (seven) days., Disp: , Rfl:  Medication Side Effects: none  Family Medical/ Social History: Changes? No  MENTAL HEALTH EXAM:  Last menstrual period 03/02/1988.There is no height or weight on file to calculate BMI.  General Appearance: Casual and Well Groomed  Eye Contact:  Good  Speech:  Clear and Coherent and Normal Rate  Volume:  Normal  Mood:  Euthymic  Affect:  Congruent  Thought Process:  Goal Directed and Descriptions of Associations: Circumstantial  Orientation:  Full (Time, Place, and Person)  Thought Content: Logical   Suicidal Thoughts:  No  Homicidal Thoughts:  No  Memory:   WNL  Judgement:  Good  Insight:  Good  Psychomotor Activity:  Normal  Concentration:  Concentration: Good and Attention Span: Good  Recall:  Good  Fund of Knowledge: Good  Language: Good  Assets:  Communication Skills Desire for Improvement Financial Resources/Insurance Housing Resilience Talents/Skills Transportation  ADL's:  Intact  Cognition: WNL  Prognosis:  Good   DIAGNOSES:    ICD-10-CM   1. Recurrent major depressive disorder, in full remission  F33.42     2. Generalized anxiety disorder  F41.1 LORazepam  (ATIVAN ) 0.5 MG tablet    3. Primary insomnia  F51.01 LORazepam  (ATIVAN ) 0.5 MG tablet     Receiving Psychotherapy: Yes  with Marval Bunde, LCSW  RECOMMENDATIONS:   PDMP reviewed.  Ativan  filled 01/26/2024. I provided approximately  20  minutes of face to face time during this encounter, including time spent before and after the visit in records review, medical decision making, counseling pertinent to today's visit, and charting.   She is doing  well on the current treatment so no changes will be made.  We again discussed the Ativan .  It is not causing confusion or imbalance.  She only takes 0.5 mg each evening.  We agree for her to stay on it as it is causing no negative effects.  Continue Buspar  10 mg, 1 po bid. Continue Cymbalta  60 mg, 1 every day. Continue Ativan  0.5 mg, 1 po qd prn. Continue Trazodone  50 mg, 1 at bedtime.  Continue therapy with Marval Bunde, LCSW. Return in 3 months.  If she is doing well at the next visit we can spread the next OV to 6 months.  Verneita Cooks, PA-C  "
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
## 2024-03-06 ENCOUNTER — Ambulatory Visit (INDEPENDENT_AMBULATORY_CARE_PROVIDER_SITE_OTHER): Admitting: Obstetrics and Gynecology

## 2024-03-06 ENCOUNTER — Encounter: Payer: Self-pay | Admitting: Obstetrics and Gynecology

## 2024-03-06 VITALS — BP 108/64 | HR 78

## 2024-03-06 DIAGNOSIS — Z5181 Encounter for therapeutic drug level monitoring: Secondary | ICD-10-CM

## 2024-03-06 DIAGNOSIS — N952 Postmenopausal atrophic vaginitis: Secondary | ICD-10-CM

## 2024-03-07 ENCOUNTER — Ambulatory Visit: Admitting: Psychiatry

## 2024-03-07 DIAGNOSIS — F411 Generalized anxiety disorder: Secondary | ICD-10-CM | POA: Diagnosis not present

## 2024-03-07 NOTE — Progress Notes (Signed)
 "       [facility] Counselor/Therapist Progress Note  Patient ID: [name], MRN: [id number],    Date: 03/07/2024  Time Spent: 53 minutes   Treatment Type: Individual Therapy  Reported Symptoms: anxiety, some depression, some issues within family relationships    Mental Status Exam:  Appearance:   Casual and Neat     Behavior:  Appropriate, Sharing, and Motivated  Motor:  Normal  Speech/Language:   Clear and Coherent  Affect:  Anxious, some depression  Mood:  anxious  Thought process:  goal directed  Thought content:    WNL  Sensory/Perceptual disturbances:    WNL  Orientation:  oriented to person, place, time/date, situation, day of week, month of year, year, and stated date of Jan. 6, 2026  Attention:  Good  Concentration:  Good  Memory:  WNL  Fund of knowledge:   Good  Insight:    Good  Judgment:   Good  Impulse Control:  Good   Risk Assessment: Danger to Self:  No Self-injurious Behavior: No Danger to Others: No Duty to Warn:no Physical Aggression / Violence:No  Access to Firearms a concern: No  Gang Involvement:No   Subjective:   Patient today working further on my anxiety, stress, some depression but better, and coping with life and what's going on in the world which can be very disturbing for patient and exacerbates symptoms. Husband having some physical issues. Continues her work on better boundaries, and communication issues. Discussed some frustrations within family relationships within immediate family, and is hoping some healing and changes can eventually occur. Frustrated, angry, at world situations that creates more chaos and turmoil.  Concerned about how current conflict and war will impact us  soon and into the future.  Patient venting well today in session but also able to eventually calm herself and pulled back from overly focusing on threatening situations and gave her attention more to her family relationships and concerns particularly with immediate  family.  Working further on her more positive self-care and letting others be of support to her.  Did encourage her to cut back on the watching of threatening television programming related to world violence and she is in agreement.  Husband's memory issues do not seem to be excessive at the moment however she does feel it is worsening.  Is close to others in her church and they are definitely a source of emotional support.  Patient to update on situation regarding 47/26-year-old granddaughter neck session.   Interventions: Cognitive Behavioral Therapy, Solution-Oriented/Positive Psychology, and Ego-Supportive Long term goal: Develop healthy cognitive patterns and beliefs about self and the world that lead to alleviation of depression and anxiety, and help prevent relapse of depression and anxiety. Short term goal: Learn and implement personal skills for managing stress, solving daily problems, and resolving conflicts effectively.  Strategies: Use modeling and role-playing to help recognize anxious/depressive/negative thought patterns that create anxious/depressive/negative feelings and actions, interrupt them and replace with more positive reality-based thoughts that do not support depression.    Diagnosis:   ICD-10-CM   1. Generalized anxiety disorder  F41.1      Plan: Patient today he dressed very neatly and seem to be feeling better physically.  Her adjusted hearing aids are allowing her to hear better.  Relationship with husband has been more steady and less conflict recently.  Seems that some of his issues and communication may be influenced by some mental challenges as he ages.  Showing good energy and motivation in session today  as she talked through several concerns regarding she and her husband, and extended family (2 adult daughters, grandchild).  Patient is working on staying focused on what she can control or change, believe more in herself, use of healthy boundaries, interrupt  anxious/depressive thoughts and challenge them to replace with more realistic thoughts, contact with supportive people, remain connected within her church family which is very supportive of her and husband, allow her faith to be a healing resource for her emotionally as well as spiritually, positive self-talk, and recognize the strength she can show when she works with goal-directed behaviors moving in a direction that best supports her overall improved emotional health and her vision into the future.  [name] continues to show good progress even in the midst of obstacles at times, she is improving her focus and motivation to work with goal-directed behaviors aimed at helping her move in a healthier and more hopeful direction into the future.  Goal review and progress/challenges noted with patient.  Next appointment within 2 to 3 weeks.   Barnie Bunde, LCSW                   "

## 2024-03-22 ENCOUNTER — Encounter: Payer: Self-pay | Admitting: Obstetrics and Gynecology

## 2024-03-23 ENCOUNTER — Other Ambulatory Visit: Payer: Self-pay | Admitting: Obstetrics and Gynecology

## 2024-03-23 MED ORDER — ESTRADIOL 0.01 % VA CREA: TOPICAL_CREAM | VAGINAL | 0 refills | Status: AC

## 2024-03-23 MED ORDER — ESTRADIOL 10 MCG VA TABS: 1.0000 | ORAL_TABLET | VAGINAL | 1 refills | Status: AC

## 2024-03-23 NOTE — Progress Notes (Signed)
 Rx for vaginal estradiol  tablets and for estradiol  vaginal cream.

## 2024-03-28 ENCOUNTER — Ambulatory Visit: Admitting: Psychiatry

## 2024-03-30 NOTE — Telephone Encounter (Signed)
 PA sent to patients insurance for vaginal estradiol  tablets. Waiting for response. Pt notified. (Key: [id number]) Cover my Meds stated before PA was sent some alternatives ...  MEDICATION PRIOR AUTH REQUIREMENT * Estradiol  Likely not required  Premarin  Vaginal Cream Likely not required  Imvexxy  Likely required  Yuvafem  Likely required

## 2024-04-06 NOTE — Telephone Encounter (Signed)
 PA appeal has been sent to plan. Pending approval or denial.

## 2024-04-18 ENCOUNTER — Ambulatory Visit: Admitting: Psychiatry

## 2024-05-24 ENCOUNTER — Ambulatory Visit: Admitting: Physician Assistant

## 2024-05-31 ENCOUNTER — Encounter: Admitting: Obstetrics and Gynecology
